# Patient Record
Sex: Female | Born: 1935 | Race: White | Hispanic: No | Marital: Married | Smoking: Never smoker
Health system: Southern US, Community
[De-identification: ages and names within clinical notes are randomized; demographics above are authoritative.]

## PROBLEM LIST (undated history)

## (undated) DIAGNOSIS — F329 Major depressive disorder, single episode, unspecified: Secondary | ICD-10-CM

## (undated) DIAGNOSIS — M1990 Unspecified osteoarthritis, unspecified site: Secondary | ICD-10-CM

## (undated) DIAGNOSIS — G629 Polyneuropathy, unspecified: Secondary | ICD-10-CM

## (undated) DIAGNOSIS — K219 Gastro-esophageal reflux disease without esophagitis: Secondary | ICD-10-CM

## (undated) DIAGNOSIS — R32 Unspecified urinary incontinence: Secondary | ICD-10-CM

## (undated) DIAGNOSIS — F32A Depression, unspecified: Secondary | ICD-10-CM

## (undated) DIAGNOSIS — I209 Angina pectoris, unspecified: Secondary | ICD-10-CM

## (undated) DIAGNOSIS — I10 Essential (primary) hypertension: Secondary | ICD-10-CM

## (undated) DIAGNOSIS — R197 Diarrhea, unspecified: Secondary | ICD-10-CM

## (undated) DIAGNOSIS — J189 Pneumonia, unspecified organism: Secondary | ICD-10-CM

## (undated) DIAGNOSIS — N393 Stress incontinence (female) (male): Secondary | ICD-10-CM

## (undated) DIAGNOSIS — R4182 Altered mental status, unspecified: Secondary | ICD-10-CM

## (undated) DIAGNOSIS — Z8619 Personal history of other infectious and parasitic diseases: Secondary | ICD-10-CM

## (undated) DIAGNOSIS — M48061 Spinal stenosis, lumbar region without neurogenic claudication: Secondary | ICD-10-CM

## (undated) DIAGNOSIS — E119 Type 2 diabetes mellitus without complications: Secondary | ICD-10-CM

## (undated) HISTORY — PX: CATARACT EXTRACTION W/ INTRAOCULAR LENS IMPLANT: SHX1309

---

## 1962-08-10 HISTORY — PX: TONSILLECTOMY: SUR1361

## 1970-08-10 HISTORY — PX: OVARIAN CYST SURGERY: SHX726

## 1980-08-10 HISTORY — PX: ABDOMINAL HYSTERECTOMY: SHX81

## 1986-08-10 HISTORY — PX: CHOLECYSTECTOMY: SHX55

## 1988-08-10 HISTORY — PX: BREAST BIOPSY: SHX20

## 1997-12-19 ENCOUNTER — Ambulatory Visit (HOSPITAL_COMMUNITY): Admission: RE | Admit: 1997-12-19 | Discharge: 1997-12-19 | Payer: Self-pay

## 1998-05-13 ENCOUNTER — Other Ambulatory Visit: Admission: RE | Admit: 1998-05-13 | Discharge: 1998-05-13 | Payer: Self-pay | Admitting: Gastroenterology

## 1998-05-14 ENCOUNTER — Ambulatory Visit (HOSPITAL_COMMUNITY): Admission: RE | Admit: 1998-05-14 | Discharge: 1998-05-14 | Payer: Self-pay | Admitting: Gastroenterology

## 1998-05-14 ENCOUNTER — Encounter: Payer: Self-pay | Admitting: Gastroenterology

## 1998-08-21 ENCOUNTER — Ambulatory Visit (HOSPITAL_COMMUNITY): Admission: RE | Admit: 1998-08-21 | Discharge: 1998-08-21 | Payer: Self-pay

## 1999-04-08 ENCOUNTER — Ambulatory Visit (HOSPITAL_BASED_OUTPATIENT_CLINIC_OR_DEPARTMENT_OTHER): Admission: RE | Admit: 1999-04-08 | Discharge: 1999-04-08 | Payer: Self-pay | Admitting: Orthopedic Surgery

## 1999-08-25 ENCOUNTER — Ambulatory Visit (HOSPITAL_COMMUNITY): Admission: RE | Admit: 1999-08-25 | Discharge: 1999-08-25 | Payer: Self-pay

## 2000-09-09 ENCOUNTER — Ambulatory Visit (HOSPITAL_COMMUNITY): Admission: RE | Admit: 2000-09-09 | Discharge: 2000-09-09 | Payer: Self-pay | Admitting: Internal Medicine

## 2000-09-09 ENCOUNTER — Encounter: Payer: Self-pay | Admitting: Internal Medicine

## 2001-07-05 ENCOUNTER — Encounter: Admission: RE | Admit: 2001-07-05 | Discharge: 2001-07-05 | Payer: Self-pay | Admitting: Family Medicine

## 2001-07-05 ENCOUNTER — Encounter: Payer: Self-pay | Admitting: Internal Medicine

## 2001-10-20 ENCOUNTER — Ambulatory Visit (HOSPITAL_COMMUNITY): Admission: RE | Admit: 2001-10-20 | Discharge: 2001-10-20 | Payer: Self-pay | Admitting: Internal Medicine

## 2001-10-20 ENCOUNTER — Encounter: Payer: Self-pay | Admitting: Internal Medicine

## 2001-10-28 ENCOUNTER — Encounter: Admission: RE | Admit: 2001-10-28 | Discharge: 2001-10-28 | Payer: Self-pay | Admitting: Internal Medicine

## 2001-10-28 ENCOUNTER — Encounter: Payer: Self-pay | Admitting: Internal Medicine

## 2003-01-12 ENCOUNTER — Encounter: Payer: Self-pay | Admitting: Internal Medicine

## 2003-01-12 ENCOUNTER — Ambulatory Visit (HOSPITAL_COMMUNITY): Admission: RE | Admit: 2003-01-12 | Discharge: 2003-01-12 | Payer: Self-pay | Admitting: Internal Medicine

## 2003-08-11 HISTORY — PX: INCONTINENCE SURGERY: SHX676

## 2004-02-22 ENCOUNTER — Encounter: Admission: RE | Admit: 2004-02-22 | Discharge: 2004-02-22 | Payer: Self-pay | Admitting: Internal Medicine

## 2004-06-02 ENCOUNTER — Observation Stay (HOSPITAL_COMMUNITY): Admission: RE | Admit: 2004-06-02 | Discharge: 2004-06-03 | Payer: Self-pay | Admitting: Urology

## 2005-03-30 ENCOUNTER — Ambulatory Visit (HOSPITAL_COMMUNITY): Admission: RE | Admit: 2005-03-30 | Discharge: 2005-03-30 | Payer: Self-pay | Admitting: Internal Medicine

## 2005-04-14 ENCOUNTER — Encounter: Admission: RE | Admit: 2005-04-14 | Discharge: 2005-04-14 | Payer: Self-pay | Admitting: Internal Medicine

## 2006-04-19 ENCOUNTER — Ambulatory Visit (HOSPITAL_COMMUNITY): Admission: RE | Admit: 2006-04-19 | Discharge: 2006-04-19 | Payer: Self-pay | Admitting: Internal Medicine

## 2007-02-15 ENCOUNTER — Encounter: Admission: RE | Admit: 2007-02-15 | Discharge: 2007-02-15 | Payer: Self-pay | Admitting: Internal Medicine

## 2007-05-03 ENCOUNTER — Ambulatory Visit (HOSPITAL_COMMUNITY): Admission: RE | Admit: 2007-05-03 | Discharge: 2007-05-03 | Payer: Self-pay | Admitting: Internal Medicine

## 2007-07-20 ENCOUNTER — Ambulatory Visit (HOSPITAL_COMMUNITY): Admission: RE | Admit: 2007-07-20 | Discharge: 2007-07-20 | Payer: Self-pay | Admitting: Urology

## 2007-08-11 DIAGNOSIS — Z8619 Personal history of other infectious and parasitic diseases: Secondary | ICD-10-CM

## 2007-08-11 HISTORY — DX: Personal history of other infectious and parasitic diseases: Z86.19

## 2007-08-11 HISTORY — PX: CYSTOSCOPY: SHX5120

## 2007-09-12 ENCOUNTER — Ambulatory Visit (HOSPITAL_BASED_OUTPATIENT_CLINIC_OR_DEPARTMENT_OTHER): Admission: RE | Admit: 2007-09-12 | Discharge: 2007-09-12 | Payer: Self-pay | Admitting: Urology

## 2008-05-03 ENCOUNTER — Ambulatory Visit (HOSPITAL_COMMUNITY): Admission: RE | Admit: 2008-05-03 | Discharge: 2008-05-03 | Payer: Self-pay | Admitting: Internal Medicine

## 2008-07-24 ENCOUNTER — Emergency Department (HOSPITAL_COMMUNITY): Admission: EM | Admit: 2008-07-24 | Discharge: 2008-07-24 | Payer: Self-pay | Admitting: Emergency Medicine

## 2009-05-06 ENCOUNTER — Ambulatory Visit (HOSPITAL_COMMUNITY): Admission: RE | Admit: 2009-05-06 | Discharge: 2009-05-06 | Payer: Self-pay | Admitting: Internal Medicine

## 2009-05-14 ENCOUNTER — Encounter: Admission: RE | Admit: 2009-05-14 | Discharge: 2009-05-14 | Payer: Self-pay | Admitting: Internal Medicine

## 2010-05-15 ENCOUNTER — Ambulatory Visit (HOSPITAL_COMMUNITY): Admission: RE | Admit: 2010-05-15 | Discharge: 2010-05-15 | Payer: Self-pay | Admitting: Internal Medicine

## 2010-12-23 NOTE — Op Note (Signed)
[name], [name]                ACCOUNT NO.:  [id number]   MEDICAL RECORD NO.:  [id number]          PATIENT TYPE:  AMB   LOCATION:  [facility]                         FACILITY:  [facility]   PHYSICIAN:  Sigmund I. Patsi Sears, M.D.DATE OF BIRTH:  [birth year]   DATE OF PROCEDURE:  DATE OF DISCHARGE:                               OPERATIVE REPORT   PREOPERATIVE DIAGNOSES:  Total urinary incontinence.   POSTOPERATIVE DIAGNOSES:  Total urinary incontinence.   OPERATIONS:  Cystourethroscopy, Macroplastique transurethral injection.   SURGEON:  Dr. Patsi Sears.   ANESTHESIA:  General with LMA.   PREPARATION:  After appropriate preanesthesia, the patient is brought to  the operating room, placed on the operating table in dorsal supine  position where general LMA anesthesia was introduced. She was then  replaced in dorsal lithotomy position where the pubis was prepped with  Betadine solution, draped in the usual fashion.   REVIEW OF HISTORY:  This 77 year old female has a history of 4 pad per  incontinence, status post AMS Monarch transvaginal sling in 2005 for  stress urinary continence with a positive Marshall test and positive Q-  tip test.  In 2007, she was noted to be  an insulin dependent diabetic,  with 2 pad per day incontinence, urge type, failing Vesicare, and  Enablex. Urodynamics showed bladder instability, hypersensitive bladder,  normal compliance, with leak point of pressure 91 cm of water. The  patient is now complaining of 10 pad per day incontinence, with  worsening cough, sneeze incontinence.  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   Urodynamics now shows an anterior abdominal leak point pressure of only  71 cm of water.  She is now for Macroplastique  transurethral injection  for ISD.   PROCEDURE:  Cystourethroscopy was accomplished, the urethra was easily  identified, and three vials of Macroplastique are injected submucosally.  The patient had excellent coaptation of her bladder neck.  The bladder  was drained of fluid, the patient was awakened and taken to the recovery  room in good condition.      Sigmund I. Patsi Sears, M.D.  Electronically Signed    SIT/MEDQ  D:  09/12/2007  T:  09/12/2007  Job:  [id number]   cc:   Thora Lance, M.D.  Fax: [phone]

## 2010-12-26 NOTE — Op Note (Signed)
[name], [name]                ACCOUNT NO.:  [id number]   MEDICAL RECORD NO.:  [id number]          PATIENT TYPE:  AMB   LOCATION:  DAY                          FACILITY:  [facility]   PHYSICIAN:  Sigmund I. Patsi Sears, M.D.DATE OF BIRTH:  1932/10/28   DATE OF PROCEDURE:  06/02/2004  DATE OF DISCHARGE:                                 OPERATIVE REPORT   PREOPERATIVE DIAGNOSES:  Stress urinary incontinence.   POSTOPERATIVE DIAGNOSES:  1.  Stress urinary incontinence.  2.  Rectocele (mild to moderate).   OPERATION:  Transvaginal AMS Monarch pubovaginal sling.   SURGEON:  Sigmund I. Patsi Sears, M.D.   ANESTHESIA:  General LMA.   PREPARATION:  After appropriate preanesthesia, the patient was brought to  the operating room and placed on the operating table in the dorsal supine  position where general LMA anesthesia was administered. She was replaced in  dorsal lithotomy position where the pubis was prepped with Betadine solution  and draped in the usual fashion.   HISTORY:  This 77 year old diabetic female complains of urinary frequency,  stress urinary incontinence wearing 3-4 pads a day, leaking with coughing,  laughing and sneezing. She also leaks without awareness. The patient is now  for pubovaginal sling.   The physical examination showed a positive Marshall test and positive Q-tip  test and no rectocele was identified on office examination. However, exam  under anesthesia, reveals a mild to moderate rectocele but no cystocele.   DESCRIPTION OF PROCEDURE:  Following Betadine vaginal prep, 6 mL of 0.25  plain Marcaine are injected into the pubovaginal cervical arch tissue and  over the urethra. A 1.5 cm incision is then made in the midline and  subcutaneous tissue dissected bilaterally to the level of the retropubic  fascia. Two separate stab wounds were then made 5 cm measured lateral to the  clitoris, and the AMS Monarch transvaginal sling was placed in standard  fashion.  Cystoscopy revealed no evidence of any bladder trauma. The sling  was then cut in the subcutaneous lateral incisions, and the sleeves were  irrigated with double antibiotic irrigation and removed in standard fashion.  A right angled clamp was placed behind the sling for tensioning. The wound  was closed in two layers with interrupted and running 3-0 Vicryl suture. The  patient tolerated the procedure well. She was awakened and taken to the  recovery room in good condition. Foley catheter was left in place.     SIT/MEDQ  D:  06/02/2004  T:  06/02/2004  Job:  [id number]

## 2011-03-10 ENCOUNTER — Other Ambulatory Visit: Payer: Self-pay | Admitting: Internal Medicine

## 2011-03-10 DIAGNOSIS — R1032 Left lower quadrant pain: Secondary | ICD-10-CM

## 2011-03-11 ENCOUNTER — Ambulatory Visit
Admission: RE | Admit: 2011-03-11 | Discharge: 2011-03-11 | Disposition: A | Discharge status: Home / Self Care | Payer: Medicare Other | Source: Ambulatory Visit | Attending: Internal Medicine | Admitting: Internal Medicine

## 2011-03-11 DIAGNOSIS — R1032 Left lower quadrant pain: Secondary | ICD-10-CM

## 2011-03-11 MED ORDER — IOHEXOL 300 MG/ML  SOLN
100.0000 mL | Freq: Once | INTRAMUSCULAR | Status: AC | PRN
  Administered 2011-03-11: 100 mL via INTRAVENOUS

## 2011-05-01 LAB — POCT I-STAT 4, (NA,K, GLUC, HGB,HCT)
Glucose, Bld: 140 — ABNORMAL HIGH
HCT: 45
Hemoglobin: 15.3 — ABNORMAL HIGH
Potassium: 3.5
Sodium: 143

## 2011-05-14 ENCOUNTER — Other Ambulatory Visit (HOSPITAL_COMMUNITY): Payer: Self-pay | Admitting: Internal Medicine

## 2011-05-14 DIAGNOSIS — Z1231 Encounter for screening mammogram for malignant neoplasm of breast: Secondary | ICD-10-CM

## 2011-05-15 LAB — COMPREHENSIVE METABOLIC PANEL
ALT: 17 U/L (ref 0–35)
Calcium: 9.5 mg/dL (ref 8.4–10.5)
Glucose, Bld: 80 mg/dL (ref 70–99)
Sodium: 142 mEq/L (ref 135–145)
Total Protein: 6.5 g/dL (ref 6.0–8.3)

## 2011-05-15 LAB — DIFFERENTIAL
Lymphs Abs: 1.8 10*3/uL (ref 0.7–4.0)
Monocytes Relative: 6 % (ref 3–12)
Neutro Abs: 4.7 10*3/uL (ref 1.7–7.7)
Neutrophils Relative %: 65 % (ref 43–77)

## 2011-05-15 LAB — CBC
Hemoglobin: 13.9 g/dL (ref 12.0–15.0)
MCHC: 32.9 g/dL (ref 30.0–36.0)
Platelets: 235 10*3/uL (ref 150–400)
RDW: 15.3 % (ref 11.5–15.5)

## 2011-05-15 LAB — URINALYSIS, ROUTINE W REFLEX MICROSCOPIC
Bilirubin Urine: NEGATIVE
Ketones, ur: NEGATIVE mg/dL
Nitrite: NEGATIVE
Protein, ur: NEGATIVE mg/dL
Urobilinogen, UA: 1 mg/dL (ref 0.0–1.0)
pH: 6.5 (ref 5.0–8.0)

## 2011-05-18 LAB — CREATININE, SERUM
Creatinine, Ser: 0.91
GFR calc non Af Amer: >60

## 2011-06-02 ENCOUNTER — Ambulatory Visit (HOSPITAL_COMMUNITY)
Admission: RE | Admit: 2011-06-02 | Discharge: 2011-06-02 | Disposition: A | Discharge status: Home / Self Care | Payer: Medicare Other | Source: Ambulatory Visit | Attending: Internal Medicine | Admitting: Internal Medicine

## 2011-06-02 DIAGNOSIS — Z1231 Encounter for screening mammogram for malignant neoplasm of breast: Secondary | ICD-10-CM | POA: Insufficient documentation

## 2011-07-27 ENCOUNTER — Ambulatory Visit (INDEPENDENT_AMBULATORY_CARE_PROVIDER_SITE_OTHER): Payer: Medicare Other | Admitting: Ophthalmology

## 2011-07-27 DIAGNOSIS — E11319 Type 2 diabetes mellitus with unspecified diabetic retinopathy without macular edema: Secondary | ICD-10-CM

## 2011-07-27 DIAGNOSIS — E1139 Type 2 diabetes mellitus with other diabetic ophthalmic complication: Secondary | ICD-10-CM

## 2011-07-27 DIAGNOSIS — H43819 Vitreous degeneration, unspecified eye: Secondary | ICD-10-CM

## 2011-09-30 ENCOUNTER — Other Ambulatory Visit: Payer: Self-pay | Admitting: Urology

## 2011-10-09 ENCOUNTER — Encounter (HOSPITAL_BASED_OUTPATIENT_CLINIC_OR_DEPARTMENT_OTHER): Payer: Self-pay | Admitting: *Deleted

## 2011-10-09 NOTE — Progress Notes (Signed)
To [facility] at 0715.Istat,Ekg on arrival,Npo after mn-Guidelines for RCC reviewed.instructed to use glucose tablets if her  levels were low since cannot eat drink after mn.

## 2011-10-14 NOTE — Progress Notes (Signed)
Spoke to [facility] at Dr Imelda Pillow office regarding whether he really wants HgA1c done as ordered even with Istat being done.  Can d/c order.

## 2011-10-15 ENCOUNTER — Encounter (HOSPITAL_BASED_OUTPATIENT_CLINIC_OR_DEPARTMENT_OTHER): Payer: Self-pay | Admitting: *Deleted

## 2011-10-15 ENCOUNTER — Ambulatory Visit (HOSPITAL_BASED_OUTPATIENT_CLINIC_OR_DEPARTMENT_OTHER): Payer: Medicare Other | Admitting: Anesthesiology

## 2011-10-15 ENCOUNTER — Encounter (HOSPITAL_BASED_OUTPATIENT_CLINIC_OR_DEPARTMENT_OTHER)
Admission: RE | Disposition: A | Discharge status: Home / Self Care | Payer: Self-pay | Source: Ambulatory Visit | Attending: Urology

## 2011-10-15 ENCOUNTER — Ambulatory Visit (HOSPITAL_BASED_OUTPATIENT_CLINIC_OR_DEPARTMENT_OTHER)
Admission: RE | Admit: 2011-10-15 | Discharge: 2011-10-16 | Disposition: A | Discharge status: Home / Self Care | Payer: Medicare Other | Source: Ambulatory Visit | Attending: Urology | Admitting: Urology

## 2011-10-15 ENCOUNTER — Other Ambulatory Visit: Payer: Self-pay

## 2011-10-15 ENCOUNTER — Encounter (HOSPITAL_BASED_OUTPATIENT_CLINIC_OR_DEPARTMENT_OTHER): Payer: Self-pay | Admitting: Anesthesiology

## 2011-10-15 DIAGNOSIS — R12 Heartburn: Secondary | ICD-10-CM | POA: Insufficient documentation

## 2011-10-15 DIAGNOSIS — M533 Sacrococcygeal disorders, not elsewhere classified: Secondary | ICD-10-CM | POA: Insufficient documentation

## 2011-10-15 DIAGNOSIS — Z01812 Encounter for preprocedural laboratory examination: Secondary | ICD-10-CM | POA: Insufficient documentation

## 2011-10-15 DIAGNOSIS — N8110 Cystocele, unspecified: Secondary | ICD-10-CM | POA: Insufficient documentation

## 2011-10-15 DIAGNOSIS — N816 Rectocele: Secondary | ICD-10-CM | POA: Insufficient documentation

## 2011-10-15 DIAGNOSIS — M545 Low back pain, unspecified: Secondary | ICD-10-CM | POA: Insufficient documentation

## 2011-10-15 DIAGNOSIS — Z90710 Acquired absence of both cervix and uterus: Secondary | ICD-10-CM | POA: Insufficient documentation

## 2011-10-15 DIAGNOSIS — R339 Retention of urine, unspecified: Secondary | ICD-10-CM | POA: Insufficient documentation

## 2011-10-15 DIAGNOSIS — F22 Delusional disorders: Secondary | ICD-10-CM

## 2011-10-15 DIAGNOSIS — Z79899 Other long term (current) drug therapy: Secondary | ICD-10-CM | POA: Insufficient documentation

## 2011-10-15 DIAGNOSIS — E119 Type 2 diabetes mellitus without complications: Secondary | ICD-10-CM | POA: Insufficient documentation

## 2011-10-15 DIAGNOSIS — N3941 Urge incontinence: Secondary | ICD-10-CM | POA: Insufficient documentation

## 2011-10-15 DIAGNOSIS — Z01810 Encounter for preprocedural cardiovascular examination: Secondary | ICD-10-CM | POA: Insufficient documentation

## 2011-10-15 DIAGNOSIS — Z7982 Long term (current) use of aspirin: Secondary | ICD-10-CM | POA: Insufficient documentation

## 2011-10-15 HISTORY — PX: CYSTOSCOPY: SHX5120

## 2011-10-15 HISTORY — DX: Unspecified urinary incontinence: R32

## 2011-10-15 HISTORY — DX: Unspecified osteoarthritis, unspecified site: M19.90

## 2011-10-15 HISTORY — DX: Essential (primary) hypertension: I10

## 2011-10-15 HISTORY — DX: Diarrhea, unspecified: R19.7

## 2011-10-15 HISTORY — PX: RECTOCELE REPAIR: SHX761

## 2011-10-15 HISTORY — DX: Polyneuropathy, unspecified: G62.9

## 2011-10-15 HISTORY — DX: Gastro-esophageal reflux disease without esophagitis: K21.9

## 2011-10-15 LAB — GLUCOSE, CAPILLARY: Glucose-Capillary: 206 mg/dL — ABNORMAL HIGH (ref 70–99)

## 2011-10-15 SURGERY — COLPORRHAPHY, POSTERIOR, FOR RECTOCELE REPAIR
Anesthesia: General | Site: Vagina | Wound class: Clean Contaminated

## 2011-10-15 MED ORDER — BUPIVACAINE-EPINEPHRINE 0.5% -1:200000 IJ SOLN
INTRAMUSCULAR | Status: DC | PRN
  Administered 2011-10-15: 30 mL

## 2011-10-15 MED ORDER — BELLADONNA ALKALOIDS-OPIUM 16.2-60 MG RE SUPP
RECTAL | Status: DC | PRN
  Administered 2011-10-15: 1 via RECTAL

## 2011-10-15 MED ORDER — HYDROCODONE-ACETAMINOPHEN 5-325 MG PO TABS
1.0000 | ORAL_TABLET | ORAL | Status: DC | PRN
  Administered 2011-10-15: 1 via ORAL
  Administered 2011-10-15: 2 via ORAL

## 2011-10-15 MED ORDER — LIDOCAINE HCL (CARDIAC) 20 MG/ML IV SOLN
INTRAVENOUS | Status: DC | PRN
  Administered 2011-10-15: 80 mg via INTRAVENOUS

## 2011-10-15 MED ORDER — KETOROLAC TROMETHAMINE 15 MG/ML IJ SOLN
15.0000 mg | Freq: Four times a day (QID) | INTRAMUSCULAR | Status: DC
  Administered 2011-10-15 (×2): 15 mg via INTRAVENOUS

## 2011-10-15 MED ORDER — PROMETHAZINE HCL 25 MG/ML IJ SOLN: 6.2500 mg | INTRAMUSCULAR | Status: DC | PRN

## 2011-10-15 MED ORDER — FENTANYL CITRATE 0.05 MG/ML IJ SOLN
INTRAMUSCULAR | Status: DC | PRN
  Administered 2011-10-15: 50 ug via INTRAVENOUS
  Administered 2011-10-15 (×2): 25 ug via INTRAVENOUS
  Administered 2011-10-15: 50 ug via INTRAVENOUS
  Administered 2011-10-15 (×2): 25 ug via INTRAVENOUS
  Administered 2011-10-15 (×2): 50 ug via INTRAVENOUS

## 2011-10-15 MED ORDER — TRIAMTERENE-HCTZ 75-50 MG PO TABS: 1.0000 | ORAL_TABLET | Freq: Every day | ORAL | Status: DC

## 2011-10-15 MED ORDER — ONDANSETRON HCL 4 MG/2ML IJ SOLN: 4.0000 mg | INTRAMUSCULAR | Status: DC | PRN

## 2011-10-15 MED ORDER — ZOLPIDEM TARTRATE 5 MG PO TABS: 5.0000 mg | ORAL_TABLET | Freq: Every evening | ORAL | Status: DC | PRN

## 2011-10-15 MED ORDER — ACETAMINOPHEN 10 MG/ML IV SOLN
INTRAVENOUS | Status: DC | PRN
  Administered 2011-10-15: 1000 mg via INTRAVENOUS

## 2011-10-15 MED ORDER — LACTATED RINGERS IV SOLN
INTRAVENOUS | Status: DC
  Administered 2011-10-15 (×3): via INTRAVENOUS

## 2011-10-15 MED ORDER — POTASSIUM CHLORIDE 20 MEQ PO PACK: 20.0000 meq | PACK | Freq: Every day | ORAL | Status: DC

## 2011-10-15 MED ORDER — ACETAMINOPHEN 10 MG/ML IV SOLN
1000.0000 mg | Freq: Four times a day (QID) | INTRAVENOUS | Status: DC
  Administered 2011-10-15: 1000 mg via INTRAVENOUS

## 2011-10-15 MED ORDER — ESTRADIOL 0.1 MG/GM VA CREA
TOPICAL_CREAM | VAGINAL | Status: DC | PRN
  Administered 2011-10-15: 1 via VAGINAL

## 2011-10-15 MED ORDER — POLYETHYLENE GLYCOL 3350 17 G PO PACK: 17.0000 g | PACK | Freq: Every day | ORAL | Status: DC | PRN

## 2011-10-15 MED ORDER — ONDANSETRON HCL 4 MG/2ML IJ SOLN
INTRAMUSCULAR | Status: DC | PRN
  Administered 2011-10-15: 4 mg via INTRAVENOUS

## 2011-10-15 MED ORDER — STERILE WATER FOR IRRIGATION IR SOLN
Status: DC | PRN
  Administered 2011-10-15: 3000 mL

## 2011-10-15 MED ORDER — CIPROFLOXACIN HCL 250 MG PO TABS
250.0000 mg | ORAL_TABLET | Freq: Two times a day (BID) | ORAL | Status: DC
  Administered 2011-10-15: 250 mg via ORAL

## 2011-10-15 MED ORDER — KETOROLAC TROMETHAMINE 30 MG/ML IJ SOLN
INTRAMUSCULAR | Status: DC | PRN
  Administered 2011-10-15: 30 mg via INTRAVENOUS

## 2011-10-15 MED ORDER — FENTANYL CITRATE 0.05 MG/ML IJ SOLN
25.0000 ug | INTRAMUSCULAR | Status: DC | PRN
  Administered 2011-10-15 (×2): 25 ug via INTRAVENOUS

## 2011-10-15 MED ORDER — PROPOFOL 10 MG/ML IV EMUL
INTRAVENOUS | Status: DC | PRN
  Administered 2011-10-15: 180 mg via INTRAVENOUS

## 2011-10-15 MED ORDER — SODIUM CHLORIDE 0.45 % IV SOLN
INTRAVENOUS | Status: DC
  Administered 2011-10-15: 20:00:00 via INTRAVENOUS

## 2011-10-15 MED ORDER — INDIGOTINDISULFONATE SODIUM 8 MG/ML IJ SOLN
INTRAMUSCULAR | Status: DC | PRN
  Administered 2011-10-15: 5 mL via INTRAVENOUS

## 2011-10-15 MED ORDER — DIPHENHYDRAMINE HCL 12.5 MG/5ML PO ELIX: 12.5000 mg | ORAL_SOLUTION | Freq: Four times a day (QID) | ORAL | Status: DC | PRN

## 2011-10-15 MED ORDER — HYOSCYAMINE SULFATE 0.125 MG SL SUBL: 0.1250 mg | SUBLINGUAL_TABLET | SUBLINGUAL | Status: DC | PRN

## 2011-10-15 MED ORDER — INSULIN ASPART 100 UNIT/ML ~~LOC~~ SOLN: 10.0000 [IU] | SUBCUTANEOUS | Status: DC

## 2011-10-15 MED ORDER — DIPHENHYDRAMINE HCL 50 MG/ML IJ SOLN: 12.5000 mg | Freq: Four times a day (QID) | INTRAMUSCULAR | Status: DC | PRN

## 2011-10-15 MED ORDER — HYDROMORPHONE HCL PF 1 MG/ML IJ SOLN: 0.5000 mg | INTRAMUSCULAR | Status: DC | PRN

## 2011-10-15 MED ORDER — SENNOSIDES-DOCUSATE SODIUM 8.6-50 MG PO TABS
2.0000 | ORAL_TABLET | Freq: Every day | ORAL | Status: DC
  Administered 2011-10-15: 2 via ORAL

## 2011-10-15 MED ORDER — SODIUM CHLORIDE 0.9 % IR SOLN
Status: DC | PRN
  Administered 2011-10-15: 09:00:00

## 2011-10-15 MED ORDER — SODIUM CHLORIDE 0.9 % IJ SOLN
INTRAMUSCULAR | Status: DC | PRN
  Administered 2011-10-15: 50 mL via INTRAVENOUS

## 2011-10-15 MED ORDER — INSULIN NPH (HUMAN) (ISOPHANE) 100 UNIT/ML ~~LOC~~ SUSP: 72.0000 [IU] | Freq: Every day | SUBCUTANEOUS | Status: DC

## 2011-10-15 MED ORDER — CEFAZOLIN SODIUM 1-5 GM-% IV SOLN
INTRAVENOUS | Status: DC | PRN
  Administered 2011-10-15: 1 g via INTRAVENOUS

## 2011-10-15 MED ORDER — HYDROCODONE-ACETAMINOPHEN 5-325 MG PO TABS: 1.0000 | ORAL_TABLET | Freq: Four times a day (QID) | ORAL | Status: DC | PRN

## 2011-10-15 SURGICAL SUPPLY — 58 items
ADH SKN CLS APL DERMABOND .7 (GAUZE/BANDAGES/DRESSINGS)
BAG URINE DRAINAGE (UROLOGICAL SUPPLIES) ×4 IMPLANT
BLADE SURG 10 STRL SS (BLADE) ×4 IMPLANT
BLADE SURG 15 STRL LF DISP TIS (BLADE) ×3 IMPLANT
BLADE SURG 15 STRL SS (BLADE) ×4
BLADE SURG ROTATE 9660 (MISCELLANEOUS) ×4 IMPLANT
BOOTIES KNEE HIGH SLOAN (MISCELLANEOUS) ×4 IMPLANT
CANISTER SUCTION 1200CC (MISCELLANEOUS) IMPLANT
CANISTER SUCTION 2500CC (MISCELLANEOUS) ×6 IMPLANT
CATH FOLEY 2WAY SLVR  5CC 16FR (CATHETERS) ×1
CATH FOLEY 2WAY SLVR 5CC 16FR (CATHETERS) ×3 IMPLANT
CLOTH BEACON ORANGE TIMEOUT ST (SAFETY) ×4 IMPLANT
COVER MAYO STAND STRL (DRAPES) ×4 IMPLANT
DERMABOND ADVANCED (GAUZE/BANDAGES/DRESSINGS)
DERMABOND ADVANCED .7 DNX12 (GAUZE/BANDAGES/DRESSINGS) IMPLANT
DIGITEX SUTURE CARTRIDGE ×6 IMPLANT
DIGITEX SUTURING DELIVERING DEVICE ×2 IMPLANT
DISSECTOR BLUNT TIP ENDO 5MM (MISCELLANEOUS) IMPLANT
DISSECTOR ROUND CHERRY 3/8 STR (MISCELLANEOUS) ×2 IMPLANT
DRAIN PENROSE 18X1/2 LTX STRL (DRAIN) IMPLANT
DRAPE CAMERA CLOSED 9X96 (DRAPES) ×4 IMPLANT
DRAPE UNDERBUTTOCKS STRL (DRAPE) ×4 IMPLANT
FLOSEAL 10ML (HEMOSTASIS) IMPLANT
GAUZE SPONGE 4X4 16PLY XRAY LF (GAUZE/BANDAGES/DRESSINGS) ×2 IMPLANT
GAUZE VAGINAL PACKING 31 073 (GAUZE/BANDAGES/DRESSINGS) ×2 IMPLANT
GLOVE BIO SURGEON STRL SZ 6.5 (GLOVE) ×2 IMPLANT
GLOVE BIO SURGEON STRL SZ7.5 (GLOVE) ×4 IMPLANT
GLOVE ECLIPSE 6.0 STRL STRAW (GLOVE) ×2 IMPLANT
GLOVE INDICATOR 6.5 STRL GRN (GLOVE) ×2 IMPLANT
GOWN PREVENTION PLUS LG XLONG (DISPOSABLE) ×4 IMPLANT
GOWN STRL REIN XL XLG (GOWN DISPOSABLE) ×4 IMPLANT
NEEDLE HYPO 22GX1.5 SAFETY (NEEDLE) ×8 IMPLANT
PACKING VAGINAL (PACKING) ×4 IMPLANT
PENCIL BUTTON HOLSTER BLD 10FT (ELECTRODE) ×4 IMPLANT
PLUG CATH AND CAP STER (CATHETERS) ×4 IMPLANT
RETRACTOR LONRSTAR 16.6X16.6CM (MISCELLANEOUS) ×3 IMPLANT
RETRACTOR STAY HOOK 5MM (MISCELLANEOUS) ×4 IMPLANT
RETRACTOR STER APS 16.6X16.6CM (MISCELLANEOUS) ×4
SET IRRIG Y TYPE TUR BLADDER L (SET/KITS/TRAYS/PACK) ×4 IMPLANT
SHEET LAVH (DRAPES) ×4 IMPLANT
SLING SOLYX SYSTEM SIS BX (SLING) IMPLANT
SPONGE LAP 18X18 X RAY DECT (DISPOSABLE) IMPLANT
SPONGE LAP 4X18 X RAY DECT (DISPOSABLE) ×4 IMPLANT
SUCTION FRAZIER TIP 10 FR DISP (SUCTIONS) ×4 IMPLANT
SUT ETHILON 2 0 PS N (SUTURE) IMPLANT
SUT MON AB 2-0 SH 27 (SUTURE)
SUT MON AB 2-0 SH27 (SUTURE) IMPLANT
SUT SILK 3 0 PS 1 (SUTURE) ×4 IMPLANT
SUT VIC AB 2-0 UR6 27 (SUTURE) ×24 IMPLANT
SYR BULB IRRIGATION 50ML (SYRINGE) ×4 IMPLANT
SYR CONTROL 10ML LL (SYRINGE) ×6 IMPLANT
SYRINGE 10CC LL (SYRINGE) ×4 IMPLANT
TISSUE AXIS TUTOPLAST 8CMX12CM (Tissue) ×2 IMPLANT
TRAY DSU PREP LF (CUSTOM PROCEDURE TRAY) ×4 IMPLANT
TUBE CONNECTING 12X1/4 (SUCTIONS) ×8 IMPLANT
WATER STERILE IRR 3000ML UROMA (IV SOLUTION) ×2 IMPLANT
WATER STERILE IRR 500ML POUR (IV SOLUTION) ×6 IMPLANT
YANKAUER SUCT BULB TIP NO VENT (SUCTIONS) IMPLANT

## 2011-10-15 NOTE — Op Note (Signed)
Pre-operative diagnosis : Posterior vaginal wall prolapse with high rectocele  Postoperative diagnosis: Same  Operation: Posterior vaginal vault extraperitoneal colpopexy with graft interposition, using Coloplast Axis  dermis graft.  Surgeon:  S. Maleeka Sabatino, MD  First assistant: None  Anesthesia:  general  Preparation: After appropriate preanesthesia, the patient was brought to the operating room, and placed upon the operating table in the dorsal supine position. The arm band was rechecked, and she underwent general LMA anesthesia. The pubis was then shaven, prepped with Betadine solution, and draped in usual fashion.  Review history: The patient is a 78-year-old diabetic female, with a 3 year history of worsening rectal prolapse, with chronic vaginal discomfort. The patient palpates her rectal prolapse outside of the vagina. She is 40 years post hysterectomy. She is para 3-3-0 with no history of tobacco use. She has an increased difficulty with defecation, but denies rectal incontinence. She also has diabetic peripheral neuropathy.  The patient has had Monarch sling in the past for stress urinary incontinence (2005), and followup. Urethral injection in 2009 with reduced stress incontinence. Physical exam showed a large, stage III rectocele (grade 4), with no evidence of mesh difficulty from prior procedure. Video urodynamics showed a slightly hyposensitive bladder, with no stress incontinence with prolapse reduced. She did have a Valsalva leak point pressure which was somewhat low at 72 cm water, however. 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  Statement of  Likelihood of Success: Excellent. TIME-OUT observed.:  Procedure: Examination under anesthesia revealed that the patient has a large rectal prolapse, with no cystocele noted. She does have a urethral  carbuncle, indicating postmenopausal vaginal atrophy. This has been discussed with the patient and her family, and she will need to have vaginal estrogenization postoperatively.  The patient had pop Q. score and, and was felt to have a follow pop Q. Score:  Aa -5  Ba -1.5    C -6                                                                                                                               Gh 3   Pb 2         TVL  7.5                                                                                                                                 Ap 2    Bp  2        D  N/a Using Marcaine 0.25 with epinephrine 1-200,000, hydrodissection was accomplished using 40 cc of solution. Using a marking pen, a triangular area of dissection was outlined, and perineal body incision was made. Subcutaneous tissue was dissected with the Strully scissors, and the large posterior vaginal wall hernia was sharply and bluntly dissected. The retroperitoneum was entered posteriorly bilaterally, and the ischial spines were identified. The sacrospinous ligaments were then dissected bilaterally.  Using the Coloplast Digitek suturing device, 2 separate 0 monofilament sutures were placed through the sacrospinous ligament. Using horizontal mattress 20 sutures, the large rectal prolapse was plicated, without tension.  A geometric form was then created using  Coloplast Axis dermis, with a proximal horizontal measurement of 8 cm, a distal measurement of 4 cm, a midline length of 7-1/2 cm. Once this graft material was designed, the sutures through the sacrospinous ligaments were attached bilaterally to the proximal edges, and a midline suture was placed at the apex using 2-0 Vicryl suture. The graft material covered the rectal prolapse easily, and without tension.  The rectal vaginal mucosa was then closed with multiple running 2-0 Vicryl sutures. Minimal bleeding was noted. The patient was given IV Tylenol at the beginning of  the procedure, and IV Toradol at the end of the procedure. Cystoscopy was accomplished, and showed normal bladder base, and urethra. Again, no evidence of any mesh erosion or extrusion was identified from prior surgery. She was given indigo carmine, and blue dye was observed in her urine on cystoscopic examination.  She was awakened, taken to recovery room in good condition.   

## 2011-10-15 NOTE — Transfer of Care (Signed)
Immediate Anesthesia Transfer of Care Note  Patient: [name]  Procedure(s) Performed: Procedure(s) (LRB): POSTERIOR REPAIR (RECTOCELE) () CYSTOSCOPY (N/A)  Patient Location: Patient transported to PACU with oxygen via face mask at 4 Liters / Min  Anesthesia Type: General  Level of Consciousness: awake and alert   Airway & Oxygen Therapy: Patient Spontanous Breathing and Patient connected to face mask oxygen  Post-op Assessment: Report given to PACU RN and Post -op Vital signs reviewed and stable  Post vital signs: Reviewed and stable  Dentition: Teeth and oropharynx remain in pre-op condition  Complications: No apparent anesthesia complications

## 2011-10-15 NOTE — Op Note (Signed)
Pre-operative diagnosis : Posterior vaginal wall prolapse with high rectocele  Postoperative diagnosis: Same  Operation: Posterior vaginal vault extraperitoneal colpopexy with graft interposition, using Coloplast Axis  dermis graft.  Surgeon:  Kathie Rhodes. Patsi Sears, MD  First assistant: None  Anesthesia:  general  Preparation: After appropriate preanesthesia, the patient was brought to the operating room, and placed upon the operating table in the dorsal supine position. The arm band was rechecked, and she underwent general LMA anesthesia. The pubis was then shaven, prepped with Betadine solution, and draped in usual fashion.  Review history: The patient is a 78 year old diabetic female, with a 3 year history of worsening rectal prolapse, with chronic vaginal discomfort. The patient palpates her rectal prolapse outside of the vagina. She is 40 years post hysterectomy. She is para 3-3-0 with no history of tobacco use. She has an increased difficulty with defecation, but denies rectal incontinence. She also has diabetic peripheral neuropathy.  The patient has had Monarch sling in the past for stress urinary incontinence (2005), and followup. Urethral injection in 2009 with reduced stress incontinence. Physical exam showed a large, stage III rectocele (grade 4), with no evidence of mesh difficulty from prior procedure. Video urodynamics showed a slightly hyposensitive bladder, with no stress incontinence with prolapse reduced. She did have a Valsalva leak point pressure which was somewhat low at 72 cm water, however. 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  Statement of  Likelihood of Success: Excellent. TIME-OUT observed.:  Procedure: Examination under anesthesia revealed that the patient has a large rectal prolapse, with no cystocele noted. She does have a urethral  carbuncle, indicating postmenopausal vaginal atrophy. This has been discussed with the patient and her family, and she will need to have vaginal estrogenization postoperatively.  The patient had pop Q. score and, and was felt to have a follow pop Q. Score:  Aa -5  Ba -1.5    C -6                                                                                                                               Gh 3   Pb 2         TVL  7.5  Ap 2    Bp  2        D  N/a Using Marcaine 0.25 with epinephrine 1-200,000, hydrodissection was accomplished using 40 cc of solution. Using a marking pen, a triangular area of dissection was outlined, and perineal body incision was made. Subcutaneous tissue was dissected with the Strully scissors, and the large posterior vaginal wall hernia was sharply and bluntly dissected. The retroperitoneum was entered posteriorly bilaterally, and the ischial spines were identified. The sacrospinous ligaments were then dissected bilaterally.  Using the Coloplast Digitek suturing device, 2 separate 0 monofilament sutures were placed through the sacrospinous ligament. Using horizontal mattress 20 sutures, the large rectal prolapse was plicated, without tension.  A geometric form was then created using  Coloplast Axis dermis, with a proximal horizontal measurement of 8 cm, a distal measurement of 4 cm, a midline length of 7-1/2 cm. Once this graft material was designed, the sutures through the sacrospinous ligaments were attached bilaterally to the proximal edges, and a midline suture was placed at the apex using 2-0 Vicryl suture. The graft material covered the rectal prolapse easily, and without tension.  The rectal vaginal mucosa was then closed with multiple running 2-0 Vicryl sutures. Minimal bleeding was noted. The patient was given IV Tylenol at the beginning of  the procedure, and IV Toradol at the end of the procedure. Cystoscopy was accomplished, and showed normal bladder base, and urethra. Again, no evidence of any mesh erosion or extrusion was identified from prior surgery. She was given indigo carmine, and blue dye was observed in her urine on cystoscopic examination.  She was awakened, taken to recovery room in good condition.

## 2011-10-15 NOTE — Anesthesia Postprocedure Evaluation (Signed)
  Anesthesia Post-op Note  Patient: [name]  Procedure(s) Performed: Procedure(s) (LRB): POSTERIOR REPAIR (RECTOCELE) () CYSTOSCOPY (N/A)  Patient Location: PACU  Anesthesia Type: General  Level of Consciousness: awake and alert   Airway and Oxygen Therapy: Patient Spontanous Breathing  Post-op Pain: mild  Post-op Assessment: Post-op Vital signs reviewed, Patient's Cardiovascular Status Stable, Respiratory Function Stable, Patent Airway and No signs of Nausea or vomiting  Post-op Vital Signs: stable  Complications: No apparent anesthesia complications

## 2011-10-15 NOTE — Interval H&P Note (Signed)
History and Physical Interval Note:  10/15/2011 8:52 AM  [name]  has presented today for surgery, with the diagnosis of rectocele  The various methods of treatment have been discussed with the patient and family. After consideration of risks, benefits and other options for treatment, the patient has consented to  Procedure(s) (LRB): ANTERIOR (CYSTOCELE) AND POSTERIOR REPAIR (RECTOCELE) (N/A) as a surgical intervention .  The patients' history has been reviewed, patient examined, no change in status, stable for surgery.  I have reviewed the patients' chart and labs.  Questions were answered to the patient's satisfaction.     Jethro Bolus I

## 2011-10-15 NOTE — Anesthesia Procedure Notes (Signed)
Procedure Name: LMA Insertion Date/Time: 10/15/2011 8:59 AM Performed by: Lorrin Jackson Pre-anesthesia Checklist: Patient identified, Emergency Drugs available, Suction available and Patient being monitored Patient Re-evaluated:Patient Re-evaluated prior to inductionOxygen Delivery Method: Circle System Utilized Preoxygenation: Pre-oxygenation with 100% oxygen Intubation Type: IV induction Ventilation: Mask ventilation without difficulty LMA: LMA with gastric port inserted LMA Size: 4.0 Number of attempts: 1 Placement Confirmation: positive ETCO2 Tube secured with: Tape Dental Injury: Teeth and Oropharynx as per pre-operative assessment

## 2011-10-15 NOTE — Progress Notes (Signed)
Upper denture returned to patient

## 2011-10-15 NOTE — Anesthesia Preprocedure Evaluation (Signed)
Anesthesia Evaluation  Patient identified by MRN, date of birth, ID band Patient awake    Reviewed: Allergy & Precautions, H&P , NPO status , Patient's Chart, lab work & pertinent test results  Airway Mallampati: II TM Distance: >3 FB Neck ROM: Full    Dental No notable dental hx. (+) Edentulous Upper   Pulmonary neg pulmonary ROS,  breath sounds clear to auscultation  Pulmonary exam normal       Cardiovascular hypertension, Pt. on medications Rhythm:Regular Rate:Normal     Neuro/Psych  Neuromuscular disease negative psych ROS   GI/Hepatic negative GI ROS, Neg liver ROS,   Endo/Other  Diabetes mellitus-, Type 1, Insulin Dependent  Renal/GU negative Renal ROS  negative genitourinary   Musculoskeletal negative musculoskeletal ROS (+)   Abdominal   Peds negative pediatric ROS (+)  Hematology negative hematology ROS (+)   Anesthesia Other Findings   Reproductive/Obstetrics negative OB ROS                           Anesthesia Physical Anesthesia Plan  ASA: III  Anesthesia Plan: General   Post-op Pain Management:    Induction: Intravenous  Airway Management Planned: LMA  Additional Equipment:   Intra-op Plan:   Post-operative Plan: Extubation in OR  Informed Consent: I have reviewed the patients History and Physical, chart, labs and discussed the procedure including the risks, benefits and alternatives for the proposed anesthesia with the patient or authorized representative who has indicated his/her understanding and acceptance.   Dental advisory given  Plan Discussed with: CRNA  Anesthesia Plan Comments:         Anesthesia Quick Evaluation

## 2011-10-15 NOTE — H&P (Signed)
Active Problems Problems  1. Lumbar Disc Degeneration 722.52 with neuropathy 2. Osteoarthritis Of The Hip Left 715.95 3. Postmenopausal Atrophic Vaginitis 627.3 4. Urge Incontinence Of Urine 788.31 5. Vaginal Wall Prolapse With Rectocele 618.04  History of Present Illness        The patient is a 78 year old female, complaining of chronic low back pain, sacroiliac pain, and vaginal pain.    She is status post Monarch sling in October 2005. The patient complains of lumbar pain with radiculopathy, as well as a past history of diabetic neuropathy. She is awakened with her groin pain, which is exacerbated with standing from a seated position, as well as with activities. Her low back pain that radiates to the left gluteal area, and lower extremity. She has superficial pain of the left ankle, described as burning pain with light touch. The patient complains of urinary frequency, urgency, urge incontinence, without painful urination, flank pain, fever, chills, or hematuria.    She has noted worsening rectal prolapse for the last 3 years, and Osie Bond this is the cause of her vaginal discomfort. The large rectal prolapse protrudes outside the vaginal opening. Stage III.  She is post hysterectomy strictly 40 years ago. She is para 3-2-0. There is no history of tobacco use. The patient has increased difficulty with defecation,but denies rectal incontinence. She has had a history, however, of chronic diarrhea.  Note the patient is post cystoscopy and Macroplastique injection of 2009 for recurrent stress urinary continence.  On physical examination, the patient was noted to have a normal urethra, with no evidence of diverticular formation, or tenderness, or hypermobility. There was no evidence of mesh extrusion. There was no atrophy, no discharge, and no evidence of apical prolapse. There was no cystocele, or enterocele palpable. The patient did have posterior vaginal wall prolapse, with a midline defect,  extruding outside the hymenal opening for greater than 2 cm.  The patient was felt to have a large, stage III  Rectocele, ( Grade 4) rectocele, with no complication identified from her anti-continence procedure. The patient does have diabetic neuropathy, and appears to have lumbar pain radiating to her lower extremities. Note that she is para 3-3 a zero, and is post-hysterectomy for many years. She hadurodynamics, which is accomplished with the rectocele present, and also reduced.  Urodynamics on 09/28/11 shows the patient has a bladder capacity of 550 cc. First sensation was at 381 cc, normal desire at 397 cc and strong desire at 458 cc. The bladder appears to be unstable at 350 cc, with a maximum bladder contraction pressure of 37 cm water. The patient's first void was secondary to bladder instability, and with the prolapse reduced.  Valsalva leak point pressure at 200 cc a 72 cm water, with leakage. There is no leakage, however, when her prolapse is reduced.  Pressure flow study showed a maximum flow rate of 10 cc per second, with detrusor pressure at maximum flow of 21 cm water. PVR is 250 cc, 300 cc. The PVR was obtained after the packing was removed.  This patient has a minimally hyposensitive detrusor, with first sensation 391 cc. She does have bladder instability. She has mild stress incontinence, but this does not appear to be her clinical problem. The patient does have symptomatic posterior vaginal wall prolapse. The patient did not empty well, leaving approximately 200 300 cc post void residual (by photo).   Past Medical History Problems  1. History of  Abdominal Pain Above The Pubic Area (Suprapubic) 789.09 2. History  of  Acute Urinary Retention 788.20 3. History of  Arthritis V13.4 4. History of  Diabetes Mellitus 250.00 5. History of  Endoscopic Injection Of Implant Material Into Bladder Neck 6. History of  Heartburn 787.1 7. History of  Murmurs 785.2 8. History of  Nephrolithiasis  V13.01 9. History of  Urge And Stress Incontinence 788.33 10. History of  Urinary Incontinence 788.30 11. History of  Urinary Symptoms 788.9  Surgical History Problems  1. History of  Cholecystectomy 2. History of  Hysterectomy V45.77 3. History of  Ovarian Cystectomy 4. History of  Urethra Surgery 5. History of  Vaginal Sling Operation For Stress Incontinence  Current Meds 1. Amiloride-Hydrochlorothiazide TABS; Therapy: (Recorded:06Feb2009) to 2. Aspirin 81 MG Oral Tablet; Therapy: (Recorded:25Nov2008) to 3. Cartia XT 180 MG/24HR CPCR; Therapy: (Recorded:06Mar2009) to 4. Estrace 0.1 MG/GM Vaginal Cream; APPLY 1/2" TO VAGINAL AREA TWICE WEEKLY; Therapy:  06Mar2009 to (Last Rx:06Mar2009) 5. HumuLIN N SUSP; Therapy: (Recorded:07Jan2009) to 6. NovoLOG SOLN; Therapy: (Recorded:25Nov2008) to 7. Tylenol TABS; Therapy: (Recorded:25Nov2008) to 8. Vitamin E 400 UNIT Oral Capsule; Therapy: (Recorded:06Mar2009) to  Allergies Medication  1. Ciprofloxacin HCl TABS 2. Sulfa Drugs  Family History Problems  1. Maternal history of  Adenocarcinoma Of The Cervix V16.49 2. Maternal history of  Breast Cancer V16.3 3. Sororal history of  Carcinoma Of The Stomach V16.0 4. Family history of  Family Health Status Number Of Children 1 son, 1 daughter 5. Family history of  Father Deceased At Age ____ 44, unknown 6. Family history of  Mother Deceased At Age ____ 53, unknown 7. Family history of  Nephrolithiasis  Social History Problems  1. Caffeine Use 1-2 per day diet mt dew; 1 1/2 cup coffee 2. Marital History - Currently Married 3. Never A Smoker Denied  4. Alcohol Use 5. Tobacco Use  Review of Systems Constitutional, skin, eye, otolaryngeal, hematologic/lymphatic, cardiovascular, pulmonary, endocrine, neurological and psychiatric system(s) were reviewed and pertinent findings if present are noted.  Genitourinary: urinary frequency, urinary urgency, nocturia, incontinence, urinary stream  starts and stops, incomplete emptying of bladder and pelvic pain pelvic pain.  Gastrointestinal: abdominal pain, diarrhea and constipation.  Musculoskeletal: back pain.    Vitals Vital Signs [Data Includes: Last 1 Day]  20Feb2013 01:18PM  Blood Pressure: 144 / 65 Temperature: 98 F Heart Rate: 84  Physical Exam Constitutional: Well nourished and well developed . No acute distress.  ENT:. The ears and nose are normal in appearance.  Neck: The appearance of the neck is normal and no neck mass is present.  Pulmonary: No respiratory distress and normal respiratory rhythm and effort.  Cardiovascular: Heart rate and rhythm are normal . No peripheral edema.  Abdomen: The abdomen is soft and nontender. No masses are palpated. No CVA tenderness. No hernias are palpable. No hepatosplenomegaly noted.  Genitourinary:. Rectal tone slightly decreasedl. Examination of the external genitalia shows normal female external genitalia and no lesions. The urethra is normal in appearance and not tender. There is no urethral mass. Vaginal exam demonstrates no abnormalities, no atrophy, no discharge, no tenderness and no uterine prolapse. No cystocele is identified. No enterocele is identified. A rectocele is present with a midline defect (grade 4 /4). The cervix is is absent. The uterus is absent. The adnexa are palpably normal. The bladder is normal on palpation, non tender and not distended. The anus is normal on inspection. The perineum is normal on inspection.  Skin: Normal skin turgor, no visible rash and no visible skin lesions.  Neuro/Psych:. Mood and  affect are appropriate.    Results/Data Urine [Data Includes: Last 1 Day]   20Feb2013  COLOR YELLOW   APPEARANCE CLEAR   SPECIFIC GRAVITY 1.010   pH 7.0   GLUCOSE NEG mg/dL  BILIRUBIN NEG   KETONE NEG mg/dL  BLOOD NEG   PROTEIN NEG mg/dL  UROBILINOGEN 0.2 mg/dL  NITRITE NEG   LEUKOCYTE ESTERASE TRACE   SQUAMOUS EPITHELIAL/HPF FEW   WBC 0-3 WBC/hpf    RBC NONE SEEN RBC/hpf  BACTERIA NONE SEEN   CRYSTALS NONE SEEN   CASTS NONE SEEN    Assessment Assessed  1. Postmenopausal Atrophic Vaginitis 627.3 2. Urge Incontinence Of Urine 788.31 3. Vaginal Wall Prolapse With Rectocele 618.04      1. Large Rectocele. Posterior vaginal wall prolapse Stage III, 2-3cm external to the Hyminal ring as major complaint. She has associated chronic diarrhea, but I am wondering if she may have chronic constipation with diarrhea sliding around the constipation. She has a decreased rectal sphincter tone.    She is para3-2, and is post hysterectomy in the distant past. Her stress incontinence was treated with. Monarch sling in the ast 10 yrs, and Macroplatique in 2009, but she still has some urodynamic evidence of low leak point pressure-but incontinence is not her major complaint. I have discussed her anatomy, and physiology problems, and I think we should proceed with posterior vaginal wall prolapse repair, with sacrospinus fixation. She will continue to clean out her rectum, snd we will not need any additional incontinence Rx at this time. We have discussed the mesh lawsuits, and we have reviewed the indications for her surgery and the alternative to vault repair. She does not need her sling mesh removed, and I have reassurred her about this.    She needs vaginal vault Femring for local estrogen and also to help keep her posterior vaginal wall in place. We will start this post op. She may need PT post op for rectal incintinence exercises..   Plan  Vaginal Wall Prolapse With Rectocele (161.09)      Posterior vaginal vault repair. Attention to her diabetes.  Post op possible PT evaluation.  UA With REFLEX  Status: Resulted - Requires Verification  Done: 01Jan0001 12:00AM Ordered Today; For: Health Maintenance (V70.0); Ordered By: Jethro Bolus  Due: 22Feb2013 Marked Important; Last Updated By: Nathaniel Man   Signatures Electronically signed by : Jethro Bolus, M.D.; Sep 30 2011  2:31PM

## 2011-10-16 LAB — GLUCOSE, CAPILLARY: Glucose-Capillary: 105 mg/dL — ABNORMAL HIGH (ref 70–99)

## 2011-10-16 LAB — POCT I-STAT 4, (NA,K, GLUC, HGB,HCT)
HCT: 42 % (ref 36.0–46.0)
Hemoglobin: 14.3 g/dL (ref 12.0–15.0)

## 2011-10-16 MED ORDER — HYDROCODONE-ACETAMINOPHEN 7.5-650 MG PO TABS: 1.0000 | ORAL_TABLET | Freq: Four times a day (QID) | ORAL | Status: AC | PRN

## 2011-10-16 MED ORDER — TRIMETHOPRIM 100 MG PO TABS: 100.0000 mg | ORAL_TABLET | ORAL | Status: AC

## 2011-10-16 NOTE — Discharge Instructions (Signed)
Anterior and Posterior Repair, Colporraphy Anterior and posterior repair colporraphy is an operation used to fix a prolapse of organs in the genital tract. Prolapse means the falling down, bulging, dropping or drooping of an organ. Organs that commonly prolapse include the rectum, bladder, vagina and uterus. Prolapse can affect a single organ or several organs at the same time. This often worsens when women stop having their monthly periods (menopause) because estrogen loss weakens the muscles and tissues in the genital tract. In addition, prolapse happens when the organs are damaged or weakened. This commonly happens after childbirth and from aging.   Some women feel pelvic pressure or have trouble holding their urine right after childbirth because of stretching and damage to pelvic tissues around the bladder.   Some women have trouble going to the bathroom (defecating) because of trapped stool in the rectum. This is because of damaged tissue around the rectum.  These troubles generally get better with time, but may get worse or return with aging. Different types of surgery are often the only form of treatment for severe prolapses.  TYPES OF GENITAL PROLAPSE YOUR CAREGIVER MAY DISCUSS ARE:  A cystocele is a prolapse of the upper (anterior) wall of the vagina. The anterior wall bulges into the vagina and brings the bladder with it.   A rectocele is a prolapse of the lower (posterior) wall of the vagina. The posterior vaginal wall bulges into the vagina and brings the rectum with it.   An enterocele is a prolapse of part of the pelvic organs called the Pouch of [place]. It also involves a portion of the small bowel. It appears as a bulge under the cervix (neck of the womb) at the top of the back wall of the vagina.   A procidentia is a complete prolapse of the uterus and the cervix. The prolapse can be seen and felt coming out of the vagina.   If the uterus is prolapsed, the uterus may be removed  (hysterectomy). Your caregiver will discuss the risks and benefits with you.  LET YOUR CAREGIVER KNOW ABOUT:   Allergies to foods and medications.   All the medications you are taking including herbs, over-the-counter medications, eye drops and creams.   Use of illegal drugs.   Smoking or heavy alcohol drinking habits.   Past problems with anesthetics or novocaine.   Possible pregnancy, if this applies.   History of blood clots or other types of blood problems.   History of bleeding problems.   Past surgery.   Other medical or health problems.  RISKS AND COMPLICATIONS  Infection. A germ starts growing in the wound. This can usually be treated with antibiotics.   Damage to other organs during surgery.   Bleeding after surgery. Your surgeon takes every precaution to keep this from happening.   Problems urinating. A catheter may be required for a few days.   Prolapse may happen again.   Problems from the anesthesia.  BEFORE THE PROCEDURE  Do not take aspirin or blood thinners a week before your surgery unless told otherwise.   Do not eat or drink anything 8 hours before your surgery.   Let your caregiver know if you get a cold or an infection before your surgery.   Plan and arrange for help when you go home from the hospital.   If you smoke, do not smoke for at least 2 weeks before the surgery.  PROCEDURE  Usually you are given medication to relax you before the surgery.  An IV will be placed in an arm vein for the anesthetic and medications. The anesthesia, spinal, or epidural puts you to sleep. You will be awake but will be free of pain during surgery.  Different repairs include:  An anterior repair. A cut (incision) is made in the midline section of the front part of the vaginal wall. A triangular shaped piece of vaginal tissue is removed and the stronger healthier tissue is sewn together in order to re-support and suspend the bladder.   A posterior repair. An  incision is made midline on the back wall of the vagina. A triangular portion of vaginal skin is removed to expose the muscle. Excess tissue is removed and stronger, healthier muscle and ligament tissue is sewn together to support the rectum.   In an A and P repair, both of the above procedures are done during the same operation.  AFTER THE PROCEDURE You will be taken to a recovery area. Nurses will check your progress and monitor your vital signs (blood pressure, pulse, breathing and temperature). This is done until you are stable. Then you will be transferred to your room. After surgery, you will have a urinary catheter (a small rubber tube to drain your bladder). This will be in place for 2 to 7 days or until your bladder is working properly on its own. The intravenous will be removed in 1 to 3 days. You may have a gauze packing in your vagina to prevent bleeding that will be removed 2 or 3 days after the surgery. You are usually in the hospital 3 to 5 days. SEEK IMMEDIATE MEDICAL CARE IF:   Increased bleeding (more than a small spot) from the vagina develops.   You notice redness, swelling, or increasing pain in vaginal area.   You develop abdominal pain or pain which is getting worse rather than better.   There is pus coming from wounds.   An unexplained oral temperature over 101 F (38.3 C) develops.   There is a bad smell coming from your vaginal area.   You feel lightheaded or faint.  Document Released: 10/17/2003 Document Revised: 07/16/2011 Document Reviewed: 02/17/2008 [facility] Patient Information 2012 [place], [place].Rectocele/Enterocele Care After A woman's birth canal (vagina) can become weak or stretched. This can be caused by childbirth, heavy lifting, lasting (chronic) constipation, aging, or pelvic surgery. When the vagina is weak and stretched, parts of the intestine can bulge into the vagina by pushing against the vaginal walls. A rectocele is when the very end of the  large intestine (rectum) causes the bulge. An enterocele is when the small intestine causes the bulge. Surgery to fix this problem is usually done through the vagina. If you just had this surgery, you were probably given a drug to make you sleep (general anesthetic) or a drug that numbs you from the waist down (spinal/epidural). Here is what happened:  The small intestine or rectum was pushed back to its normal place.   The vaginal wall was made stronger. Sometimes this is done with stitches or a mesh-like material.  HOME CARE INSTRUCTIONS  Some women go home the same day as their surgery. Others stay in the hospital for a few days. This depends on the size and type of repair.  Pain and Medications  Some pain is normal after this surgery. Only take pain medicine your surgeon prescribed. Follow the directions carefully.   Do not take aspirin. It can cause bleeding.   Do not drink alcohol while taking pain medication.  You may be given a medicine (antibiotic) that kills germs. Follow the directions carefully.   Take warm sitz baths 2 times a day to control discomfort and reduce any swelling. Take sitz baths with your caregiver's permission.  Diet  Go back to your normal eating as directed by your caregiver.   Drink a lot of fluids. Drink at least 6 glasses of water every day.  Activity  Move around and walk as much as possible. This can keep blood clots from forming in your legs.   Do not climb stairs until your caregiver says it is okay.   Do not lift objects 5 pounds (2.3 kg) or heavier. Do not bend or strain for 6 to 8 weeks.   Do not drive until after you stop taking pain medicine and your caregiver says it is okay.   Your return to work will depend on the type of work you do. Ask your caregiver what is best for you.   Ask your caregiver when you can resume sexual activity. Most women can start having sex in about 6 weeks after their surgery.   Get plenty of rest during the  day and sleep at night.   Have someone help you with your household chores and activities for 3 to 4 weeks.  Other Precautions  You may have some discharge from the vagina for a few weeks after the surgery. It may have small amounts of blood in it. This is normal. If you have questions, ask your caregiver.   Do not use tampons or douche.   You should be able to take a shower a day after your surgery. Do not take a tub bath for at least a week.   Take it easy for awhile. You should feel much better in 2 to 3 weeks. It may take up to 6 weeks to feel completely normal.   Keep all follow-up appointments.   Take your temperature twice a day and write it down.   Make sure your family understands everything about your surgery and recovery.  SEEK MEDICAL CARE IF:   You have any questions about your medication, or you need stronger pain medication.   Pain continues, even after taking pain medication.   You become constipated.   You have an oral temperature above 102 F (38.9 C).   You develop swelling and redness in the surgery area.   You become dizzy or lightheaded.   You feel sick to your stomach (nauseous), throw up (vomit), or have diarrhea.   You develop a rash.   You have a reaction to your medications.  SEEK IMMEDIATE MEDICAL CARE IF:   Pain gets worse.   You have new bleeding from your vagina.   Discharge from the vagina becomes heavy, or it has a bad smell.   You have an oral temperature above 102 F (38.9 C), not controlled by medicine.   You develop belly (abdominal) pain.   You develop chest pain.   You develop shortness of breath.   You pass out (faint).   You develop pain, swelling, or redness in the leg.   You have pain or burning with urination.   You have bloody urine or cannot urinate.  MAKE SURE YOU:   Understand these instructions.   Will watch your condition.   Will get help right away if you are not doing well or get worse.  Document  Released: 10/21/2009 Document Revised: 07/16/2011 Document Reviewed: 10/21/2009 [facility] Patient Information 2012 [place], [place].

## 2011-10-16 NOTE — Progress Notes (Signed)
Urology Progress Note  1 Day Post-Op   Subjective:     No acute urologic events overnight.  Voided this AM.  Ambulation: yes   Flatus:  yes  Bowel movement :  no  Pain: complete resolution  Objective:  Blood pressure 123/67, pulse 73, temperature 98.2 F (36.8 C), temperature source Oral, resp. rate 18, height 5' 4.5" (1.638 m), weight 76.204 kg (168 lb), SpO2 95.00%.  Physical Exam:  General:  No acute distress, awake Extremities: extremities normal, atraumatic, no cyanosis or edema Genitourinary:  Normal BUS Foley:  D/C    I/O last 3 completed shifts: In: 3570 [P.O.:1320; I.V.:2000; Other:250] Out: 755 [Urine:755]  Recent Labs  [facility] 10/15/11 0747   HGB 14.3   WBC --   PLT --    Recent Labs  [facility] 10/15/11 0747   NA 142   K 4.1   CL --   CO2 --   BUN --   CREATININE --   CALCIUM --   GFRNONAA --   GFRAA --     No results found for this basename: PT:2,INR:2,APTT:2 in the last 72 hours   No components found with this basename: ABG:2  Assessment/Plan:  Pt is to increase activities as tolerated. Drive in 5 days. No heavy lifting.

## 2011-10-16 NOTE — Discharge Summary (Signed)
Physician Discharge Summary  Patient ID: [name] MRN: [id number] DOB/AGE: 1933/01/18 78 y.o.  Admit date: 10/15/2011 Discharge date: 10/16/2011  Admission Diagnoses:  Discharge Diagnoses:  Active Problems:  * No active hospital problems. *    Discharged Condition: good  Hospital Course: posterior vaginal wall repair  Consults: none  Significant Diagnostic Studies: none  Treatments: posterior vaginal wasll repair  Discharge Exam: Blood pressure 123/67, pulse 73, temperature 98.2 F (36.8 C), temperature source Oral, resp. rate 18, height 5' 4.5" (1.638 m), weight 76.204 kg (168 lb), SpO2 95.00%. General appearance: alert and cooperative GI: normal findings: bowel sounds normal and no masses palpable Extremities: extremities normal, atraumatic, no cyanosis or edema  Disposition:   Discharge Orders    Future Appointments: Provider: Department: Dept Phone: Center:   01/25/2012 7:30 AM Sherrie George, MD [facility] [phone] None     Future Orders Please Complete By Expires   Diet - low sodium heart healthy      Increase activity slowly      Discontinue IV        Medication List  As of 10/16/2011  8:43 AM   STOP taking these medications         aspirin 81 MG tablet      HYDROcodone-acetaminophen 5-325 MG per tablet      vitamin E 400 UNIT capsule         TAKE these medications         amiloride-hydrochlorothiazide 5-50 MG tablet   Commonly known as: MODURETIC   Take 1 tablet by mouth daily.      cholecalciferol 400 UNITS Tabs   Commonly known as: VITAMIN D   Take 1,000 Units by mouth.      hydrocodone-acetaminophen 7.5-650 MG per tablet   Commonly known as: LORCET PLUS   Take 1 tablet by mouth every 6 (six) hours as needed for pain.      insulin lispro 100 UNIT/ML injection   Commonly known as: HUMALOG   Inject into the skin 3 (three) times daily with meals as needed.      insulin NPH 100 UNIT/ML injection   Commonly known as: HUMULIN  N,NOVOLIN N   Inject 72 Units into the skin daily before breakfast. Adjusts to keep CBG-range 80-100      potassium chloride 20 MEQ packet   Commonly known as: KLOR-CON   Take 20 mEq by mouth daily.      trimethoprim 100 MG tablet   Commonly known as: TRIMPEX   Take 1 tablet (100 mg total) by mouth 1 day or 1 dose.           Follow-up Information    Follow up with Jethro Bolus I, MD. (per prior appointment)    Contact information:   [address] [place], [address] [facility] [place] [address] [phone]          Signed: Jethro Bolus I 10/16/2011, 8:43 AM

## 2011-10-26 ENCOUNTER — Encounter (HOSPITAL_BASED_OUTPATIENT_CLINIC_OR_DEPARTMENT_OTHER): Payer: Self-pay | Admitting: Urology

## 2011-12-04 ENCOUNTER — Emergency Department (HOSPITAL_COMMUNITY): Payer: Medicare Other

## 2011-12-04 ENCOUNTER — Emergency Department (HOSPITAL_COMMUNITY)
Admission: EM | Admit: 2011-12-04 | Discharge: 2011-12-04 | Disposition: A | Discharge status: Home / Self Care | Payer: Medicare Other | Attending: Emergency Medicine | Admitting: Emergency Medicine

## 2011-12-04 ENCOUNTER — Encounter (HOSPITAL_COMMUNITY): Payer: Self-pay | Admitting: Emergency Medicine

## 2011-12-04 DIAGNOSIS — I10 Essential (primary) hypertension: Secondary | ICD-10-CM | POA: Insufficient documentation

## 2011-12-04 DIAGNOSIS — M545 Low back pain, unspecified: Secondary | ICD-10-CM | POA: Insufficient documentation

## 2011-12-04 DIAGNOSIS — Z79899 Other long term (current) drug therapy: Secondary | ICD-10-CM | POA: Insufficient documentation

## 2011-12-04 DIAGNOSIS — K219 Gastro-esophageal reflux disease without esophagitis: Secondary | ICD-10-CM | POA: Insufficient documentation

## 2011-12-04 DIAGNOSIS — M5432 Sciatica, left side: Secondary | ICD-10-CM

## 2011-12-04 DIAGNOSIS — Z8739 Personal history of other diseases of the musculoskeletal system and connective tissue: Secondary | ICD-10-CM | POA: Insufficient documentation

## 2011-12-04 DIAGNOSIS — E119 Type 2 diabetes mellitus without complications: Secondary | ICD-10-CM | POA: Insufficient documentation

## 2011-12-04 DIAGNOSIS — M5430 Sciatica, unspecified side: Secondary | ICD-10-CM | POA: Insufficient documentation

## 2011-12-04 MED ORDER — IBUPROFEN 200 MG PO TABS
400.0000 mg | ORAL_TABLET | Freq: Once | ORAL | Status: AC
  Administered 2011-12-04: 400 mg via ORAL
  Filled 2011-12-04: qty 2

## 2011-12-04 MED ORDER — ONDANSETRON 4 MG PO TBDP
4.0000 mg | ORAL_TABLET | Freq: Once | ORAL | Status: AC
  Administered 2011-12-04: 4 mg via ORAL
  Filled 2011-12-04: qty 1

## 2011-12-04 MED ORDER — HYDROCODONE-ACETAMINOPHEN 5-500 MG PO TABS: 1.0000 | ORAL_TABLET | Freq: Four times a day (QID) | ORAL | Status: DC | PRN

## 2011-12-04 MED ORDER — HYDROCODONE-ACETAMINOPHEN 5-325 MG PO TABS
2.0000 | ORAL_TABLET | Freq: Once | ORAL | Status: AC
  Administered 2011-12-04: 2 via ORAL
  Filled 2011-12-04: qty 2

## 2011-12-04 MED ORDER — FENTANYL CITRATE 0.05 MG/ML IJ SOLN
50.0000 ug | Freq: Once | INTRAMUSCULAR | Status: AC
  Administered 2011-12-04: 50 ug via INTRAMUSCULAR
  Filled 2011-12-04: qty 2

## 2011-12-04 NOTE — ED Notes (Signed)
Pt unable to ambulate increase pain with wt bearing while standing with at site  of bed

## 2011-12-04 NOTE — ED Notes (Signed)
Pt presented to the ER with c/o bilateral leg pain, pt states that this sx are lasting for years now, however, pt reports that she was also Dx with sciatica and that this am pt was not able to bear wt to the left leg, needed assist for ambulation, secondary to pain in both legs.

## 2011-12-04 NOTE — Discharge Instructions (Signed)
Do not use tramadol-acetaminophen if you choose to use hydrocodone-acetaminophen. Also, do not take Tylenol (acetaminophen) while taking hydrocodone-acetaminophen. You may alternate the hydrocodone-acetaminophen with ibuprofen for additional pain relief and anti-inflammatory effects. Call Dr. Jone Baseman office today to schedule close followup appointment next week for recheck of ongoing pain however return to emergency department for any changing or worsening of symptoms as discussed.  Sciatica Sciatica is a weakness and/or changes in sensation (tingling, jolts, hot and cold, numbness) along the path the sciatic nerve travels. Irritation or damage to lumbar nerve roots is often also referred to as lumbar radiculopathy.  Lumbar radiculopathy (Sciatica) is the most common form of this problem. Radiculopathy can occur in any of the nerves coming out of the spinal cord. The problems caused depend on which nerves are involved. The sciatic nerve is the large nerve supplying the branches of nerves going from the hip to the toes. It often causes a numbness or weakness in the skin and/or muscles that the sciatic nerve serves. It also may cause symptoms (problems) of pain, burning, tingling, or electric shock-like feelings in the path of this nerve. This usually comes from injury to the fibers that make up the sciatic nerve. Some of these symptoms are low back pain and/or unpleasant feelings in the following areas:  From the mid-buttock down the back of the leg to the back of the knee.   And/or the outside of the calf and top of the foot.   And/or behind the inner ankle to the sole of the foot.  CAUSES   Herniated or slipped disc. Discs are the little cushions between the bones in the back.   Pressure by the piriformis muscle in the buttock on the sciatic nerve (Piriformis Syndrome).   Misalignment of the bones in the lower back and buttocks (Sacroiliac Joint Derangement).   Narrowing of the spinal canal  that puts pressure on or pinches the fibers that make up the sciatic nerve.   A slipped vertebra that is out of line with those above or beneath it.   Abnormality of the nervous system itself so that nerve fibers do not transmit signals properly, especially to feet and calves (neuropathy).   Tumor (this is rare).  Your caregiver can usually determine the cause of your sciatica and begin the treatment most likely to help you. TREATMENT  Taking over-the-counter painkillers, physical therapy, rest, exercise, spinal manipulation, and injections of anesthetics and/or steroids may be used. Surgery, acupuncture, and Yoga can also be effective. Mind over matter techniques, mental imagery, and changing factors such as your bed, chair, desk height, posture, and activities are other treatments that may be helpful. You and your caregiver can help determine what is best for you. With proper diagnosis, the cause of most sciatica can be identified and removed. Communication and cooperation between your caregiver and you is essential. If you are not successful immediately, do not be discouraged. With time, a proper treatment can be found that will make you comfortable. HOME CARE INSTRUCTIONS   If the pain is coming from a problem in the back, applying ice to that area for 15 to 20 minutes, 3 to 4 times per day while awake, may be helpful. Put the ice in a plastic bag. Place a towel between the bag of ice and your skin.   You may exercise or perform your usual activities if these do not aggravate your pain, or as suggested by your caregiver.   Only take over-the-counter or prescription medicines for  pain, discomfort, or fever as directed by your caregiver.   If your caregiver has given you a follow-up appointment, it is very important to keep that appointment. Not keeping the appointment could result in a chronic or permanent injury, pain, and disability. If there is any problem keeping the appointment, you must  call back to this facility for assistance.  SEEK IMMEDIATE MEDICAL CARE IF:   You experience loss of control of bowel or bladder.   You have increasing weakness in the trunk, buttocks, or legs.   There is numbness in any areas from the hip down to the toes.   You have difficulty walking or keeping your balance.   You have any of the above, with fever or forceful vomiting.  Document Released: 07/21/2001 Document Revised: 07/16/2011 Document Reviewed: 03/09/2008 [facility] Patient Information 2012 [place], [place].

## 2011-12-04 NOTE — ED Notes (Signed)
Urine specimen collected at bedside

## 2011-12-04 NOTE — ED Provider Notes (Signed)
History     CSN: [id number]  Arrival date & time 12/04/11  0541   First MD Initiated Contact with Patient 12/04/11 [phone]      Chief Complaint  Patient presents with  . Leg Pain    (Consider location/radiation/quality/duration/timing/severity/associated sxs/prior treatment) HPI  Patient presents to emergency department with her husband at bedside with complaint of bilateral lower back pain with radiation of pain into her left buttock, hip, and posterior thigh. Patient states pain is similar to the pain she's been having intermittently over the last few weeks. Patient states that she has seen her primary care physician who diagnosed her with sciatica 2 weeks ago. Patient states at that time her primary care physician started on prednisone, Motrin, and as needed Tylenol. Patient states that she finished the prednisone and went back to see him with some intermittent ongoing pain but with some improvement and he switched her from acetaminophen to tramadol-acetaminophen. Patient states that she has been taking tramadol-acetaminophen with some relief of pain however she states she woke this morning was unable to bear weight do to increase severity of pain in her left buttock and thigh. Patient took nothing for pain this morning PTA. Patient denies any saddle seat paresthesias, loss of bowel or bladder function, or extremity numbness/tingling/weakness. She denies any injury to her back. Patient states symptoms were gradual onset, intermittent, and persistent. Patient states pain is similar to the pain that she's been having on and off for the last few weeks however more severe this morning.  Past Medical History  Diagnosis Date  . Diabetes mellitus     greater than 30 years  . Hypertension   . Incontinence of urine   . Diarrhea   . Neuropathy     bilateral feet  . Arthritis     lower back/hands  . GERD (gastroesophageal reflux disease)     Past Surgical History  Procedure Date  . Eye  surgery     bil.cataract-unknown year  . Incontinence surgery 2005  . Tonsillectomy 1964  . Abdominal hysterectomy     1982  . Ovarian cyst surgery 1972  . Breast biopsy 1990  . Cholecystectomy 1988  . Cystoscopy 2009    macroplastique inj  . Rectocele repair 10/15/2011    Procedure: POSTERIOR REPAIR (RECTOCELE);  Surgeon: Kathi Ludwig, MD;  Location: [facility];  Service: Urology;;  posterior vaginal vault repair with sacral spinus repair with graft  . Cystoscopy 10/15/2011    Procedure: CYSTOSCOPY;  Surgeon: Kathi Ludwig, MD;  Location: [facility];  Service: Urology;  Laterality: N/A;    History reviewed. No pertinent family history.  History  Substance Use Topics  . Smoking status: Never Smoker   . Smokeless tobacco: Not on file  . Alcohol Use: No    OB History    Grav Para Term Preterm Abortions TAB SAB Ect Mult Living                  Review of Systems  All other systems reviewed and are negative.    Allergies  Neosporin and Sulfa antibiotics  Home Medications   Current Outpatient Rx  Name Route Sig Dispense Refill  . AMILORIDE-HYDROCHLOROTHIAZIDE 5-50 MG PO TABS Oral Take 1 tablet by mouth daily.    . ASPIRIN EC 81 MG PO TBEC Oral Take 81 mg by mouth daily.    . CHOLECALCIFEROL 400 UNITS PO TABS Oral Take 1,000 Units by mouth.    . INSULIN  LISPRO (HUMAN) 100 UNIT/ML [place] SOLN Subcutaneous Inject 0-10 Units into the skin 3 (three) times daily with meals as needed.     . INSULIN ISOPHANE HUMAN 100 UNIT/ML  SUSP Subcutaneous Inject 72 Units into the skin daily before breakfast. Adjusts to keep CBG-range 80-100    . POTASSIUM CHLORIDE 20 MEQ PO PACK Oral Take 20 mEq by mouth daily.      BP 133/97  Pulse 102  Temp(Src) 98.1 F (36.7 C) (Oral)  Resp 16  SpO2 99%  Physical Exam  Nursing note and vitals reviewed. Constitutional: She is oriented to person, place, and time. She appears well-developed and  well-nourished. No distress.  HENT:  Head: Normocephalic and atraumatic.  Eyes: Conjunctivae are normal.  Neck: Normal range of motion. Neck supple.  Cardiovascular: Normal rate, regular rhythm, normal heart sounds and intact distal pulses.  Exam reveals no gallop and no friction rub.   No murmur heard. Pulmonary/Chest: Effort normal and breath sounds normal. No respiratory distress. She has no wheezes. She has no rales. She exhibits no tenderness.  Abdominal: Soft. Bowel sounds are normal. She exhibits no distension and no mass. There is no tenderness. There is no rebound and no guarding.  Genitourinary:       Good rectal tone. No TTP.   Musculoskeletal: Normal range of motion. She exhibits tenderness. She exhibits no edema.       TTP of left lower lumbar paraspinal region and sacral region. No skin changes.   Pelvis stable and non tender. Negative straight leg raise. FROM of bilateral LE.   Neurological: She is alert and oriented to person, place, and time. She has normal reflexes.  Skin: Skin is warm and dry. No rash noted. She is not diaphoretic. No erythema.  Psychiatric: She has a normal mood and affect.    ED Course  Procedures (including critical care time)  By mouth had will, hydrocodone acetaminophen, and ODT Zofran  Labs Reviewed - No data to display Dg Lumbar Spine Complete  12/04/2011  *RADIOLOGY REPORT*  Clinical Data: Low back pain and bilateral leg pain.  LUMBAR SPINE - COMPLETE 4+ VIEW  Comparison: No priors.  Comparison is made with prior sagittal reconstructions from CT of the abdomen and pelvis 03/11/2011.  Findings: There is a compression fracture of the superior endplate of T12 with approximately 15% loss of anterior vertebral body height (this is unchanged).  No additional acute displaced fractures or compression type fractures are identified.  There is significant multilevel degenerative disc disease, most pronounced at the L3-L4 where there are advanced  degenerative changes of the adjacent vertebral body endplates.  Multilevel facet arthropathy is also seen throughout the lumbar spine, most severe at L4-L5 and L5- S1.  Alignment is anatomic on the lateral projection, and there is a very mild levoscoliosis of the lumbar spine centered at L3 on the frontal projection.  Atherosclerosis of the visualized abdominal vasculature.  Surgical clips projecting over the right upper quadrant of the abdomen, likely from prior cholecystectomy.  IMPRESSION: 1.  Multilevel degenerative disc disease and lumbar spondylosis without acute radiographic abnormality of the lumbar spine to account for the patient's symptoms, as above. 2.  Old compression fracture of T12 is unchanged, as above. 3.  Atherosclerosis. 4.  Status post cholecystectomy.  Original Report Authenticated By: Florencia Reasons, M.D.     1. Sciatica of left side   2. Lower back pain       MDM  Patient is now able to stand  and bear weight and angulated to the bathroom with minimal assistance complaining of mild ongoing pain but much improved since arrival. Patient has no red flags for worrisome back pain. No signs or symptoms of cauda equina. Bilateral lower extremities are neurovascularly intact. She has no abdominal pain. Spoke at length with patient about changing or worsening symptoms that should prompt immediate return to emergency department otherwise to followup with her primary care provider for reevaluation of ongoing mild pain. She voices her understanding and is agreeable to plan.        Jenness Corner, [place] 12/04/11 1119

## 2011-12-04 NOTE — ED Notes (Signed)
Patient ambulated to restroom with standby assist.  

## 2011-12-04 NOTE — ED Notes (Signed)
Pt st's she hasn't been able to walk for the past couple of days.  St's she cannot bear weight on her L leg.  St's she is having pain down the backs of both legs and into her back.  Pt unable to walk.

## 2011-12-04 NOTE — ED Notes (Signed)
Patient transported to X-ray 

## 2011-12-04 NOTE — ED Provider Notes (Signed)
Medical screening examination/treatment/procedure(s) were performed by non-physician practitioner and as supervising physician I was immediately available for consultation/collaboration.   Laray Anger, DO 12/04/11 2219

## 2011-12-07 ENCOUNTER — Other Ambulatory Visit: Payer: Self-pay | Admitting: Internal Medicine

## 2011-12-07 DIAGNOSIS — M545 Low back pain: Secondary | ICD-10-CM

## 2011-12-08 ENCOUNTER — Emergency Department (HOSPITAL_COMMUNITY): Payer: Medicare Other

## 2011-12-08 ENCOUNTER — Encounter (HOSPITAL_COMMUNITY): Payer: Self-pay | Admitting: Emergency Medicine

## 2011-12-08 ENCOUNTER — Emergency Department (HOSPITAL_COMMUNITY)
Admission: EM | Admit: 2011-12-08 | Discharge: 2011-12-09 | Disposition: A | Discharge status: Home / Self Care | Payer: Medicare Other | Attending: Emergency Medicine | Admitting: Emergency Medicine

## 2011-12-08 DIAGNOSIS — E876 Hypokalemia: Secondary | ICD-10-CM | POA: Insufficient documentation

## 2011-12-08 DIAGNOSIS — R599 Enlarged lymph nodes, unspecified: Secondary | ICD-10-CM | POA: Insufficient documentation

## 2011-12-08 DIAGNOSIS — R109 Unspecified abdominal pain: Secondary | ICD-10-CM | POA: Insufficient documentation

## 2011-12-08 DIAGNOSIS — M5430 Sciatica, unspecified side: Secondary | ICD-10-CM | POA: Insufficient documentation

## 2011-12-08 LAB — COMPREHENSIVE METABOLIC PANEL
ALT: 19 U/L (ref 0–35)
Alkaline Phosphatase: 99 U/L (ref 39–117)
CO2: 25 mEq/L (ref 19–32)
Chloride: 95 mEq/L — ABNORMAL LOW (ref 96–112)
GFR calc Af Amer: >90 mL/min (ref >90)
GFR calc non Af Amer: 83 mL/min — ABNORMAL LOW (ref >90)
Glucose, Bld: 109 mg/dL — ABNORMAL HIGH (ref 70–99)
Potassium: 3 mEq/L — ABNORMAL LOW (ref 3.5–5.1)
Sodium: 132 mEq/L — ABNORMAL LOW (ref 135–145)
Total Bilirubin: 0.5 mg/dL (ref 0.3–1.2)

## 2011-12-08 LAB — GLUCOSE, CAPILLARY: Glucose-Capillary: 115 mg/dL — ABNORMAL HIGH (ref 70–99)

## 2011-12-08 MED ORDER — ONDANSETRON HCL 4 MG/2ML IJ SOLN
4.0000 mg | Freq: Once | INTRAMUSCULAR | Status: AC
  Administered 2011-12-08: 4 mg via INTRAVENOUS
  Filled 2011-12-08: qty 2

## 2011-12-08 MED ORDER — HYDROMORPHONE HCL PF 1 MG/ML IJ SOLN: 1.0000 mg | Freq: Once | INTRAMUSCULAR | Status: DC

## 2011-12-08 MED ORDER — SODIUM CHLORIDE 0.9 % IV BOLUS (SEPSIS)
500.0000 mL | Freq: Once | INTRAVENOUS | Status: AC
  Administered 2011-12-08: 500 mL via INTRAVENOUS

## 2011-12-08 MED ORDER — HYDROMORPHONE HCL PF 1 MG/ML IJ SOLN
1.0000 mg | Freq: Once | INTRAMUSCULAR | Status: AC
  Administered 2011-12-08: 1 mg via INTRAMUSCULAR
  Filled 2011-12-08: qty 1

## 2011-12-08 NOTE — ED Provider Notes (Signed)
History     CSN: [id number]  Arrival date & time 12/08/11  2049   First MD Initiated Contact with Patient 12/08/11 2233      Chief Complaint  Patient presents with  . Abdominal Pain  . Constipation    (Consider location/radiation/quality/duration/timing/severity/associated sxs/prior treatment) HPI Pt initially complaining of constipation esp LLQ pain and has not had significant BM for 5 days. Recently seen and treated for L sciatica. Has not had pain meds to day and now with increasing L lumbar pain radiating down L leg. No saddle anesthesia. No focal weakness. No fever, chills. Outpt MRI scheduled for tomorrow.   Past Medical History  Diagnosis Date  . Diabetes mellitus     greater than 30 years  . Hypertension   . Incontinence of urine   . Diarrhea   . Neuropathy     bilateral feet  . Arthritis     lower back/hands  . GERD (gastroesophageal reflux disease)     Past Surgical History  Procedure Date  . Eye surgery     bil.cataract-unknown year  . Incontinence surgery 2005  . Tonsillectomy 1964  . Abdominal hysterectomy     1982  . Ovarian cyst surgery 1972  . Breast biopsy 1990  . Cholecystectomy 1988  . Cystoscopy 2009    macroplastique inj  . Rectocele repair 10/15/2011    Procedure: POSTERIOR REPAIR (RECTOCELE);  Surgeon: Kathi Ludwig, MD;  Location: [facility];  Service: Urology;;  posterior vaginal vault repair with sacral spinus repair with graft  . Cystoscopy 10/15/2011    Procedure: CYSTOSCOPY;  Surgeon: Kathi Ludwig, MD;  Location: [facility];  Service: Urology;  Laterality: N/A;    Family History  Problem Relation Age of Onset  . Osteoarthritis Sister   . Osteoarthritis Brother     History  Substance Use Topics  . Smoking status: Never Smoker   . Smokeless tobacco: Not on file  . Alcohol Use: No    OB History    Grav Para Term Preterm Abortions TAB SAB Ect Mult Living                  Review of  Systems  Constitutional: Negative for fever and chills.  Respiratory: Negative for shortness of breath.   Cardiovascular: Positive for chest pain.  Gastrointestinal: Positive for abdominal pain and constipation. Negative for nausea, vomiting and diarrhea.  Musculoskeletal: Positive for back pain. Negative for myalgias and joint swelling.  Skin: Negative for rash and wound.  Neurological: Negative for weakness and numbness.    Allergies  Neosporin and Sulfa antibiotics  Home Medications   Current Outpatient Rx  Name Route Sig Dispense Refill  . AMILORIDE-HYDROCHLOROTHIAZIDE 5-50 MG PO TABS Oral Take 1 tablet by mouth daily.    . ASPIRIN EC 81 MG PO TBEC Oral Take 81 mg by mouth daily.    Marland Kitchen VITAMIN D 1000 UNITS PO TABS Oral Take 1,000 Units by mouth daily.    . INSULIN LISPRO (HUMAN) 100 UNIT/ML [place] SOLN Subcutaneous Inject 3-5 Units into the skin 3 (three) times daily as needed. For CBG > 150; sliding scale.    Marland Kitchen POTASSIUM CHLORIDE CRYS ER 20 MEQ PO TBCR Oral Take 20 mEq by mouth daily.    Marland Kitchen VITAMIN E 400 UNITS PO CAPS Oral Take 400 Units by mouth daily.    . DSS 100 MG PO CAPS Oral Take 100 mg by mouth 2 (two) times daily. 60 capsule 5  .  IBUPROFEN 200 MG PO TABS Oral Take 200 mg by mouth every 4 (four) hours as needed.    . INSULIN ISOPHANE HUMAN 100 UNIT/ML [place] SUSP Subcutaneous Inject 55 Units into the skin 2 (two) times daily before a meal. 55 units in AM and 45 units in PM 1 vial 11  . LORAZEPAM 0.5 MG PO TABS Oral Take 1 tablet (0.5 mg total) by mouth every 8 (eight) hours as needed for anxiety. 30 tablet 1  . TRAMADOL HCL 50 MG PO TABS Oral Take 1 tablet (50 mg total) by mouth every 6 (six) hours as needed. 60 tablet 5    BP 181/75  Pulse 95  Temp(Src) 97.9 F (36.6 C) (Oral)  Resp 24  SpO2 100%  Physical Exam  Nursing note and vitals reviewed. Constitutional: She is oriented to person, place, and time. She appears well-developed and well-nourished. She appears distressed.   HENT:  Head: Normocephalic and atraumatic.  Mouth/Throat: Oropharynx is clear and moist.  Eyes: EOM are normal. Pupils are equal, round, and reactive to light.  Neck: Normal range of motion. Neck supple.  Cardiovascular: Normal rate and regular rhythm.   Pulmonary/Chest: Effort normal and breath sounds normal. No respiratory distress. She has no wheezes. She has no rales.  Abdominal: Soft. Bowel sounds are normal. She exhibits no mass. There is tenderness (diffuse tenderness, esp LLQ. no rebound or guarding). There is no rebound and no guarding.  Musculoskeletal: Normal range of motion. She exhibits no edema and no tenderness.  Neurological: She is alert and oriented to person, place, and time.       Sensation intact, 5/5 motor  Skin: Skin is warm and dry. No rash noted. No erythema.  Psychiatric: She has a normal mood and affect. Her behavior is normal.    ED Course  Procedures (including critical care time)  Labs Reviewed  GLUCOSE, CAPILLARY - Abnormal; Notable for the following:    Glucose-Capillary 115 (*)    All other components within normal limits  DIFFERENTIAL - Abnormal; Notable for the following:    Neutrophils Relative 79 (*)    Neutro Abs 7.9 (*)    All other components within normal limits  COMPREHENSIVE METABOLIC PANEL - Abnormal; Notable for the following:    Sodium 132 (*)    Potassium 3.0 (*)    Chloride 95 (*)    Glucose, Bld 109 (*)    GFR calc non Af Amer 83 (*)    All other components within normal limits  URINALYSIS, ROUTINE W REFLEX MICROSCOPIC - Abnormal; Notable for the following:    pH 8.5 (*)    All other components within normal limits  CBC  LIPASE, BLOOD  LAB REPORT - SCANNED   No results found.   1. Sciatica   2. Abdominal pain   3. Hypokalemia       MDM          Loren Racer, MD 12/21/11 [phone]

## 2011-12-08 NOTE — ED Notes (Signed)
Brought in by EMS with c/o constipation with abdominal pain. Per EMS, pt had 3 enemas tonight but without good result.  Pt reports that she has had no good BM since Friday. Pt also reports LLQ abdominal pain. Denies nausea or vomiting.

## 2011-12-09 ENCOUNTER — Ambulatory Visit
Admission: RE | Admit: 2011-12-09 | Discharge: 2011-12-09 | Disposition: A | Discharge status: Home / Self Care | Payer: Medicare Other | Source: Ambulatory Visit | Attending: Internal Medicine | Admitting: Internal Medicine

## 2011-12-09 ENCOUNTER — Emergency Department (HOSPITAL_COMMUNITY): Payer: Medicare Other

## 2011-12-09 DIAGNOSIS — M545 Low back pain: Secondary | ICD-10-CM

## 2011-12-09 LAB — CBC
Hemoglobin: 12.9 g/dL (ref 12.0–15.0)
Platelets: 263 10*3/uL (ref 150–400)
RBC: 4.71 MIL/uL (ref 3.87–5.11)
WBC: 10 10*3/uL (ref 4.0–10.5)

## 2011-12-09 LAB — DIFFERENTIAL
Lymphocytes Relative: 13 % (ref 12–46)
Lymphs Abs: 1.3 10*3/uL (ref 0.7–4.0)
Monocytes Relative: 7 % (ref 3–12)
Neutrophils Relative %: 79 % — ABNORMAL HIGH (ref 43–77)

## 2011-12-09 LAB — URINALYSIS, ROUTINE W REFLEX MICROSCOPIC
Bilirubin Urine: NEGATIVE
Ketones, ur: NEGATIVE mg/dL
Nitrite: NEGATIVE
Protein, ur: NEGATIVE mg/dL
pH: 8.5 — ABNORMAL HIGH (ref 5.0–8.0)

## 2011-12-09 MED ORDER — OXYCODONE-ACETAMINOPHEN 5-325 MG PO TABS
2.0000 | ORAL_TABLET | Freq: Once | ORAL | Status: AC
  Administered 2011-12-09: 2 via ORAL
  Filled 2011-12-09: qty 2

## 2011-12-09 MED ORDER — IOHEXOL 300 MG/ML  SOLN
100.0000 mL | Freq: Once | INTRAMUSCULAR | Status: AC | PRN
  Administered 2011-12-09: 100 mL via INTRAVENOUS

## 2011-12-09 MED ORDER — POTASSIUM CHLORIDE 10 MEQ/100ML IV SOLN
10.0000 meq | Freq: Once | INTRAVENOUS | Status: AC
  Administered 2011-12-09: 10 meq via INTRAVENOUS
  Filled 2011-12-09: qty 100

## 2011-12-09 NOTE — Discharge Instructions (Signed)
Your CT scan shows slightly enlarged lymph node in your abdomen but no other acute findings - please share your results with your family doctor this week.    Have you family doctor recheck your potassium in one week.  You have been diagnosed with undifferentiated abdominal pain.  Abdominal pain can be caused by many things. Your caregiver evaluates the seriousness of your pain by an examination and possibly blood or urine tests and imaging (CT scan, x-rays, ultrasound). Many cases can be observed and treated at home after initial evaluation in the emergency department. Even though you are being discharged home, abdominal pain can be unpredictable. Therefore, you need a repeat exam if your pain does not resolve, returns, or worsens. Most patient's with abdominal pain do not need to be admitted to the hospital or have surgery, but serious problems like appendicitis and gallbladder attacks can start out as nonspecific pain. Many abdominal conditions cannot be diagnosed in 1 visit, so followup evaluations are very important.  Seek immediate medical attention if:  *The pain does not go away or becomes severe. *Temperature above 101 develops *Repeated vomiting occurs(multiple episodes) *The pain becomes localized to portions of the abdomen. The right side could possibly be appendicitis. In an adult, the left lower portion of the abdomen could be colitis or diverticulitis. *Blood is being passed in stools or vomit *Return also if you develop chest pain, difficulty breathing, dizziness or fainting, or become confused poorly responsive or inconsolable (young children).     If you do not have a physician, you should reference the below phone numbers and call in the morning to establish follow up care.  RESOURCE GUIDE  Dental Problems  Patients with Medicaid: [facility] [phone] [address]                                           [phone] [address] Phone:  [phone]                                                  Phone:  [phone]  If unable to pay or uninsured, contact:  [facility] or [facility] to become qualified for the adult dental clinic.  Chronic Pain Problems Contact [facility]  [phone] Patients need to be referred by their primary care doctor.  Insufficient Money for Medicine Contact [employer]:  call "211" or [facility] [phone].  No Primary Care Doctor Call [facility]  [phone] Other agencies that provide inexpensive medical care    [facility]  [phone]    [facility]  [phone]    [facility]  [phone]    [facility]  [phone]    [facility]  [phone]    [facility]  [phone]  Psychological Services [facility] Behavioral Health  [phone] [facility]  [phone] [facility] Mental Health   [phone] (emergency services [phone])  Substance Abuse Resources [facility]  7791095211 [facility] [phone] [facility] [phone] Floydene Flock [phone] Residential & Outpatient Substance Abuse Program  [phone]  Abuse/Neglect [facility] Child Abuse Hotline [phone]) 073-0475 [facility] Child Abuse Hotline 402-395-4898 (After Hours)  Emergency Shelter [facility] [phone]  Maternity Homes [facility] at [facility] [place] [facility] [phone] [place] Alert Services [phone]  MRSA Hotline #:   2700130926    [facility] Resources  [facility] [place]     [employer]                          [facility] Dept. [address] [place]                       [address]      [address]  Blondell Reveal Phone:  [phone]                                   Phone:  [phone]                  Phone:  [phone]  [facility] Mental Health Phone:  [phone]  [facility] Child Abuse Hotline 8028267518 [phone] (After Hours)

## 2011-12-09 NOTE — ED Provider Notes (Signed)
  Physical Exam  BP 181/75  Pulse 95  Temp(Src) 97.9 F (36.6 C) (Oral)  Resp 24  SpO2 100%  Physical Exam  ED Course  Procedures  MDM Patient accepted at change of shift, has had some sciatic pain and some abdominal pain. Her CT scan of the abdomen shows a lymph node that is enlarged but no other significant findings, the reconstituted findings on her spine showed multilevel degenerative change but no acute findings. Her pain has improved significantly after getting intravenous hydromorphone. She wants to followup at the outpatient radiology services for an MRI at 6:30 in the morning. This has arty been scheduled and she is in contact with neurosurgery who will followup her results today. She is neurovascularly intact at this time, has no weakness or numbness of the lower extremities and abdominal pain has resolved      Vida Roller, MD 12/09/11 [phone]

## 2011-12-12 ENCOUNTER — Encounter (HOSPITAL_COMMUNITY): Payer: Self-pay | Admitting: Emergency Medicine

## 2011-12-12 ENCOUNTER — Emergency Department (HOSPITAL_COMMUNITY): Payer: Medicare Other

## 2011-12-12 ENCOUNTER — Inpatient Hospital Stay (HOSPITAL_COMMUNITY)
Admission: EM | Admit: 2011-12-12 | Discharge: 2011-12-15 | DRG: 392 | Disposition: A | Discharge status: Home Health | Payer: Medicare Other | Attending: Internal Medicine | Admitting: Internal Medicine

## 2011-12-12 DIAGNOSIS — G4762 Sleep related leg cramps: Secondary | ICD-10-CM | POA: Diagnosis present

## 2011-12-12 DIAGNOSIS — R112 Nausea with vomiting, unspecified: Secondary | ICD-10-CM

## 2011-12-12 DIAGNOSIS — E871 Hypo-osmolality and hyponatremia: Secondary | ICD-10-CM | POA: Diagnosis present

## 2011-12-12 DIAGNOSIS — K5900 Constipation, unspecified: Principal | ICD-10-CM | POA: Diagnosis present

## 2011-12-12 DIAGNOSIS — E119 Type 2 diabetes mellitus without complications: Secondary | ICD-10-CM | POA: Diagnosis present

## 2011-12-12 DIAGNOSIS — E860 Dehydration: Secondary | ICD-10-CM

## 2011-12-12 DIAGNOSIS — M48061 Spinal stenosis, lumbar region without neurogenic claudication: Secondary | ICD-10-CM | POA: Diagnosis present

## 2011-12-12 DIAGNOSIS — M545 Low back pain: Secondary | ICD-10-CM | POA: Diagnosis present

## 2011-12-12 DIAGNOSIS — R252 Cramp and spasm: Secondary | ICD-10-CM | POA: Diagnosis present

## 2011-12-12 LAB — COMPREHENSIVE METABOLIC PANEL
AST: 18 U/L (ref 0–37)
Albumin: 3.7 g/dL (ref 3.5–5.2)
Alkaline Phosphatase: 99 U/L (ref 39–117)
Chloride: 91 mEq/L — ABNORMAL LOW (ref 96–112)
Potassium: 3.8 mEq/L (ref 3.5–5.1)
Sodium: 127 mEq/L — ABNORMAL LOW (ref 135–145)
Total Bilirubin: 0.5 mg/dL (ref 0.3–1.2)

## 2011-12-12 LAB — DIFFERENTIAL
Basophils Absolute: 0 10*3/uL (ref 0.0–0.1)
Lymphs Abs: 1.4 10*3/uL (ref 0.7–4.0)
Monocytes Relative: 7 % (ref 3–12)
Neutro Abs: 5.6 10*3/uL (ref 1.7–7.7)

## 2011-12-12 LAB — CBC
HCT: 36.9 % (ref 36.0–46.0)
MCHC: 35 g/dL (ref 30.0–36.0)
MCV: 78.3 fL (ref 78.0–100.0)
RDW: 13.4 % (ref 11.5–15.5)
WBC: 7.7 10*3/uL (ref 4.0–10.5)

## 2011-12-12 LAB — URINALYSIS, ROUTINE W REFLEX MICROSCOPIC
Bilirubin Urine: NEGATIVE
Glucose, UA: NEGATIVE mg/dL
Hgb urine dipstick: NEGATIVE
Specific Gravity, Urine: 1.014 (ref 1.005–1.030)
Urobilinogen, UA: 0.2 mg/dL (ref 0.0–1.0)

## 2011-12-12 LAB — GLUCOSE, CAPILLARY: Glucose-Capillary: 76 mg/dL (ref 70–99)

## 2011-12-12 LAB — OCCULT BLOOD, POC DEVICE: Fecal Occult Bld: NEGATIVE

## 2011-12-12 MED ORDER — ACETAMINOPHEN 650 MG RE SUPP: 650.0000 mg | Freq: Four times a day (QID) | RECTAL | Status: DC | PRN

## 2011-12-12 MED ORDER — SODIUM CHLORIDE 0.9 % IV BOLUS (SEPSIS)
1000.0000 mL | Freq: Once | INTRAVENOUS | Status: AC
  Administered 2011-12-12: 1000 mL via INTRAVENOUS

## 2011-12-12 MED ORDER — ACETAMINOPHEN 325 MG PO TABS: 650.0000 mg | ORAL_TABLET | Freq: Four times a day (QID) | ORAL | Status: DC | PRN

## 2011-12-12 MED ORDER — HYDROCODONE-ACETAMINOPHEN 5-325 MG PO TABS: 1.0000 | ORAL_TABLET | ORAL | Status: DC | PRN

## 2011-12-12 MED ORDER — INSULIN NPH (HUMAN) (ISOPHANE) 100 UNIT/ML ~~LOC~~ SUSP
10.0000 [IU] | Freq: Two times a day (BID) | SUBCUTANEOUS | Status: DC
  Administered 2011-12-14 – 2011-12-15 (×3): 10 [IU] via SUBCUTANEOUS
  Filled 2011-12-12: qty 10

## 2011-12-12 MED ORDER — ONDANSETRON HCL 4 MG/2ML IJ SOLN
4.0000 mg | Freq: Four times a day (QID) | INTRAMUSCULAR | Status: DC | PRN
  Administered 2011-12-13: 4 mg via INTRAVENOUS
  Filled 2011-12-12: qty 2

## 2011-12-12 MED ORDER — SENNA 8.6 MG PO TABS
1.0000 | ORAL_TABLET | Freq: Two times a day (BID) | ORAL | Status: DC
  Administered 2011-12-12 – 2011-12-14 (×5): 8.6 mg via ORAL
  Filled 2011-12-12 (×5): qty 1

## 2011-12-12 MED ORDER — MORPHINE SULFATE 4 MG/ML IJ SOLN
4.0000 mg | Freq: Once | INTRAMUSCULAR | Status: AC
  Administered 2011-12-12: 4 mg via INTRAVENOUS
  Filled 2011-12-12: qty 1

## 2011-12-12 MED ORDER — HYDROMORPHONE HCL PF 1 MG/ML IJ SOLN
0.5000 mg | INTRAMUSCULAR | Status: DC | PRN
  Administered 2011-12-12: 0.5 mg via INTRAVENOUS
  Filled 2011-12-12: qty 1

## 2011-12-12 MED ORDER — FLEET ENEMA 7-19 GM/118ML RE ENEM: 1.0000 | ENEMA | Freq: Once | RECTAL | Status: AC | PRN

## 2011-12-12 MED ORDER — SODIUM CHLORIDE 0.9 % IV SOLN
INTRAVENOUS | Status: AC
  Administered 2011-12-12: 21:00:00 via INTRAVENOUS

## 2011-12-12 MED ORDER — ONDANSETRON HCL 4 MG/2ML IJ SOLN
4.0000 mg | Freq: Three times a day (TID) | INTRAMUSCULAR | Status: DC | PRN
  Filled 2011-12-12: qty 2

## 2011-12-12 MED ORDER — ASPIRIN EC 81 MG PO TBEC
81.0000 mg | DELAYED_RELEASE_TABLET | Freq: Every day | ORAL | Status: DC
  Administered 2011-12-13 – 2011-12-14 (×2): 81 mg via ORAL
  Filled 2011-12-12 (×3): qty 1

## 2011-12-12 MED ORDER — DOCUSATE SODIUM 100 MG PO CAPS
100.0000 mg | ORAL_CAPSULE | Freq: Two times a day (BID) | ORAL | Status: DC
  Administered 2011-12-12 – 2011-12-14 (×5): 100 mg via ORAL
  Filled 2011-12-12 (×7): qty 1

## 2011-12-12 MED ORDER — MORPHINE SULFATE 2 MG/ML IJ SOLN
2.0000 mg | INTRAMUSCULAR | Status: DC | PRN
  Administered 2011-12-12 – 2011-12-13 (×2): 2 mg via INTRAVENOUS
  Filled 2011-12-12 (×2): qty 1

## 2011-12-12 MED ORDER — POLYETHYLENE GLYCOL 3350 17 G PO PACK
17.0000 g | PACK | Freq: Two times a day (BID) | ORAL | Status: DC
  Administered 2011-12-12 – 2011-12-14 (×5): 17 g via ORAL
  Filled 2011-12-12 (×7): qty 1

## 2011-12-12 MED ORDER — SODIUM CHLORIDE 0.9 % IV SOLN
INTRAVENOUS | Status: AC
  Administered 2011-12-12: via INTRAVENOUS

## 2011-12-12 MED ORDER — ALUM & MAG HYDROXIDE-SIMETH 200-200-20 MG/5ML PO SUSP: 30.0000 mL | Freq: Four times a day (QID) | ORAL | Status: DC | PRN

## 2011-12-12 MED ORDER — INSULIN ASPART 100 UNIT/ML ~~LOC~~ SOLN
0.0000 [IU] | Freq: Three times a day (TID) | SUBCUTANEOUS | Status: DC
  Administered 2011-12-13: 5 [IU] via SUBCUTANEOUS
  Administered 2011-12-14: 2 [IU] via SUBCUTANEOUS
  Administered 2011-12-14 (×2): 3 [IU] via SUBCUTANEOUS
  Administered 2011-12-15: 5 [IU] via SUBCUTANEOUS

## 2011-12-12 MED ORDER — POTASSIUM CHLORIDE CRYS ER 20 MEQ PO TBCR
20.0000 meq | EXTENDED_RELEASE_TABLET | Freq: Every day | ORAL | Status: DC
  Administered 2011-12-13 – 2011-12-14 (×2): 20 meq via ORAL
  Filled 2011-12-12 (×3): qty 1

## 2011-12-12 MED ORDER — ONDANSETRON HCL 4 MG PO TABS
4.0000 mg | ORAL_TABLET | Freq: Four times a day (QID) | ORAL | Status: DC | PRN
  Administered 2011-12-15: 4 mg via ORAL
  Filled 2011-12-12: qty 1

## 2011-12-12 MED ORDER — ONDANSETRON 4 MG PO TBDP
4.0000 mg | ORAL_TABLET | Freq: Once | ORAL | Status: AC
  Administered 2011-12-12: 4 mg via ORAL
  Filled 2011-12-12 (×2): qty 1

## 2011-12-12 NOTE — ED Notes (Signed)
 output on I&O.

## 2011-12-12 NOTE — ED Notes (Addendum)
Tuesday was the last bowel movement. Bowel colors were brown. She took 3 enemas on Tuesday. She had been on Miralax and she took the Miralax today.  She is on pain meds and took two today at 1120 today. She takes Hydrocone Acetaminophen q4hours. She had surgery on the first of march for a hernia and vaginal and rectal construction. She's having trouble with her sciatic nerve, arthritis in the back, and degenerative disk disease. She was in [place] Tuesday. She received a CT with no impressions noted. They did discover a low potassium level. She takes potassium everyday, but she was still low and they told her to come here and get it checked again. Chronic urinary frequency.

## 2011-12-12 NOTE — ED Provider Notes (Signed)
History     CSN: [id number]  Arrival date & time 12/12/11  1557   First MD Initiated Contact with Patient 12/12/11 1705      Chief Complaint  Patient presents with  . Abdominal Pain  . Nausea    (Consider location/radiation/quality/duration/timing/severity/associated sxs/prior treatment) HPI Complains of low bowel pain radiating to low back and posterior thighs onset 2 weeks ago. Treated with Vicodin, without relief. Pain is constant worse with movement last bowel movement 4 days ago. Admits to nausea and dry heaves today vomited one time today also had dry heaves yesterday pain is also worse with needing not improved by anything. Patient was evaluated here 12/08/2011 for same complaint had CT scans of lumbosacral spine and of the abdomen and pelvis showing degenerative disc disease otherwise no acute disease denies fever denies other associated symptoms Past Medical History  Diagnosis Date  . Diabetes mellitus     greater than 30 years  . Hypertension   . Incontinence of urine   . Diarrhea   . Neuropathy     bilateral feet  . Arthritis     lower back/hands  . GERD (gastroesophageal reflux disease)     Past Surgical History  Procedure Date  . Eye surgery     bil.[email or web address]-unknown year  . Incontinence surgery 2005  . Tonsillectomy 1964  . Abdominal hysterectomy     1982  . Ovarian cyst surgery 1972  . Breast biopsy 1990  . Cholecystectomy 1988  . Cystoscopy 2009    macroplastique inj  . Rectocele repair 10/15/2011    Procedure: POSTERIOR REPAIR (RECTOCELE);  Surgeon: Kathi Ludwig, MD;  Location: [facility];  Service: Urology;;  posterior vaginal vault repair with sacral spinus repair with graft  . Cystoscopy 10/15/2011    Procedure: CYSTOSCOPY;  Surgeon: Kathi Ludwig, MD;  Location: [facility];  Service: Urology;  Laterality: N/A;    No family history on file.  History  Substance Use Topics  . Smoking status: Never Smoker    . Smokeless tobacco: Not on file  . Alcohol Use: No    OB History    Grav Para Term Preterm Abortions TAB SAB Ect Mult Living                  Review of Systems  Unable to perform ROS All other systems reviewed and are negative.    Allergies  Neosporin and Sulfa antibiotics  Home Medications   Current Outpatient Rx  Name Route Sig Dispense Refill  . AMILORIDE-HYDROCHLOROTHIAZIDE 5-50 MG PO TABS Oral Take 1 tablet by mouth daily.    . ASPIRIN EC 81 MG PO TBEC Oral Take 81 mg by mouth daily.    Marland Kitchen VITAMIN D 1000 UNITS PO TABS Oral Take 1,000 Units by mouth daily.    Marland Kitchen HYDROCODONE-ACETAMINOPHEN 5-500 MG PO TABS Oral Take 1-2 tablets by mouth every 4 (four) hours as needed.    . IBUPROFEN 200 MG PO TABS Oral Take 200 mg by mouth every 4 (four) hours as needed.    . INSULIN LISPRO (HUMAN) 100 UNIT/ML [place] SOLN Subcutaneous Inject 3-5 Units into the skin 3 (three) times daily as needed. For CBG > 150; sliding scale.    . INSULIN ISOPHANE HUMAN 100 UNIT/ML [place] SUSP Subcutaneous Inject 65 Units into the skin 2 (two) times daily before a meal. Adjusts to keep CBG-range 80-100    . POLYETHYLENE GLYCOL 3350 PO PACK Oral Take 17 g by mouth daily  as needed. For constipation.    Marland Kitchen POTASSIUM CHLORIDE CRYS ER 20 MEQ PO TBCR Oral Take 20 mEq by mouth daily.    Marland Kitchen VITAMIN E 400 UNITS PO CAPS Oral Take 400 Units by mouth daily.      BP 174/70  Pulse 80  Temp(Src) 98.4 F (36.9 C) (Oral)  Resp 25  SpO2 94%  Physical Exam  Nursing note and vitals reviewed. Constitutional: She appears well-developed and well-nourished. She appears distressed.       Appears mildly uncomfortable  HENT:  Head: Normocephalic and atraumatic.       Mucous membranes dry  Eyes: Conjunctivae are normal. Pupils are equal, round, and reactive to light.  Neck: Neck supple. No tracheal deviation present. No thyromegaly present.  Cardiovascular: Normal rate and regular rhythm.   No murmur heard. Pulmonary/Chest:  Effort normal and breath sounds normal.  Abdominal: Soft. Bowel sounds are normal. She exhibits no distension. There is tenderness.       Tender infraumbilical area  Musculoskeletal: Normal range of motion. She exhibits no edema and no tenderness.       Entire spine nontender  Neurological: She is alert. She has normal reflexes. No cranial nerve deficit. Coordination normal.  Skin: Skin is warm and dry. No rash noted.  Psychiatric: She has a normal mood and affect.    ED Course  Procedures (including critical care time) Feels somewhat improved after treatment with intravenous narcotics, antiemetics and fluids.  Labs Reviewed  OCCULT BLOOD, POC DEVICE  OCCULT BLOOD X 1 CARD TO LAB, STOOL   No results found.   No diagnosis found.  Results for orders placed during the hospital encounter of 12/12/11  OCCULT BLOOD, POC DEVICE      Component Value Range   Fecal Occult Bld NEGATIVE    COMPREHENSIVE METABOLIC PANEL      Component Value Range   Sodium 127 (*) 135 - 145 (mEq/L)   Potassium 3.8  3.5 - 5.1 (mEq/L)   Chloride 91 (*) 96 - 112 (mEq/L)   CO2 29  19 - 32 (mEq/L)   Glucose, Bld 78  70 - 99 (mg/dL)   BUN 16  6 - 23 (mg/dL)   Creatinine, Ser 1.61  0.50 - 1.10 (mg/dL)   Calcium 8.6  8.4 - 09.6 (mg/dL)   Total Protein 6.5  6.0 - 8.3 (g/dL)   Albumin 3.7  3.5 - 5.2 (g/dL)   AST 18  0 - 37 (U/L)   ALT 15  0 - 35 (U/L)   Alkaline Phosphatase 99  39 - 117 (U/L)   Total Bilirubin 0.5  0.3 - 1.2 (mg/dL)   GFR calc non Af Amer 84 (*) >90 (mL/min)   GFR calc Af Amer >90  >90 (mL/min)  LIPASE, BLOOD      Component Value Range   Lipase 14  11 - 59 (U/L)  CBC      Component Value Range   WBC 7.7  4.0 - 10.5 (K/uL)   RBC 4.71  3.87 - 5.11 (MIL/uL)   Hemoglobin 12.9  12.0 - 15.0 (g/dL)   HCT 04.5  40.9 - 81.1 (%)   MCV 78.3  78.0 - 100.0 (fL)   MCH 27.4  26.0 - 34.0 (pg)   MCHC 35.0  30.0 - 36.0 (g/dL)   RDW 91.4  78.2 - 95.6 (%)   Platelets 194  150 - 400 (K/uL)    DIFFERENTIAL      Component Value Range   Neutrophils  Relative 73  43 - 77 (%)   Lymphocytes Relative 18  12 - 46 (%)   Monocytes Relative 7  3 - 12 (%)   Eosinophils Relative 2  0 - 5 (%)   Basophils Relative 0  0 - 1 (%)   Neutro Abs 5.6  1.7 - 7.7 (K/uL)   Lymphs Abs 1.4  0.7 - 4.0 (K/uL)   Monocytes Absolute 0.5  0.1 - 1.0 (K/uL)   Eosinophils Absolute 0.2  0.0 - 0.7 (K/uL)   Basophils Absolute 0.0  0.0 - 0.1 (K/uL)   Smear Review MORPHOLOGY UNREMARKABLE    URINALYSIS, ROUTINE W REFLEX MICROSCOPIC      Component Value Range   Color, Urine YELLOW  YELLOW    APPearance CLEAR  CLEAR    Specific Gravity, Urine 1.014  1.005 - 1.030    pH 7.5  5.0 - 8.0    Glucose, UA NEGATIVE  NEGATIVE (mg/dL)   Hgb urine dipstick NEGATIVE  NEGATIVE    Bilirubin Urine NEGATIVE  NEGATIVE    Ketones, ur NEGATIVE  NEGATIVE (mg/dL)   Protein, ur NEGATIVE  NEGATIVE (mg/dL)   Urobilinogen, UA 0.2  0.0 - 1.0 (mg/dL)   Nitrite NEGATIVE  NEGATIVE    Leukocytes, UA NEGATIVE  NEGATIVE    Dg Lumbar Spine Complete  12/04/2011  *RADIOLOGY REPORT*  Clinical Data: Low back pain and bilateral leg pain.  LUMBAR SPINE - COMPLETE 4+ VIEW  Comparison: No priors.  Comparison is made with prior sagittal reconstructions from CT of the abdomen and pelvis 03/11/2011.  Findings: There is a compression fracture of the superior endplate of T12 with approximately 15% loss of anterior vertebral body height (this is unchanged).  No additional acute displaced fractures or compression type fractures are identified.  There is significant multilevel degenerative disc disease, most pronounced at the L3-L4 where there are advanced degenerative changes of the adjacent vertebral body endplates.  Multilevel facet arthropathy is also seen throughout the lumbar spine, most severe at L4-L5 and L5- S1.  Alignment is anatomic on the lateral projection, and there is a very mild levoscoliosis of the lumbar spine centered at L3 on the frontal  projection.  Atherosclerosis of the visualized abdominal vasculature.  Surgical clips projecting over the right upper quadrant of the abdomen, likely from prior cholecystectomy.  IMPRESSION: 1.  Multilevel degenerative disc disease and lumbar spondylosis without acute radiographic abnormality of the lumbar spine to account for the patient's symptoms, as above. 2.  Old compression fracture of T12 is unchanged, as above. 3.  Atherosclerosis. 4.  Status post cholecystectomy.  Original Report Authenticated By: Florencia Reasons, M.D.   Ct Lumbar Spine Wo Contrast  12/09/2011  *RADIOLOGY REPORT*  Clinical Data: Left lower quadrant pain.  CT LUMBAR SPINE WITHOUT CONTRAST  Technique:  Multidetector CT imaging of the lumbar spine was performed without intravenous contrast administration. Multiplanar CT image reconstructions were also generated.  Comparison: 12/09/2011 abdomen pelvis CT.  Findings: Leftward curvature and multilevel degenerative changes, most pronounced at L3-4 and L5 S1.  There is a broad-based L2-3 disc bulge results in mild central canal narrowing.  L3-4 paracentral disc bulge results in moderate right paracentral narrowing and moderate neural foraminal narrowing.  Vacuum disc phenomenon at L4-5. Disc bulge results in severe central canal narrowing.  There is mild superior endplate deformity at T12 with less than 25% vertebral body height loss. No retropulsion.  See separate abdomen pelvis CT report for intra-abdominal findings.  IMPRESSION: Multilevel degenerative changes  and disc bulges as described above.  Mild superior endplate deformity of T12 is unchanged from May, with less than 25% vertebral body height loss.  Original Report Authenticated By: Waneta Martins, M.D.   Mr Lumbar Spine Wo Contrast  12/09/2011  *RADIOLOGY REPORT*  Clinical Data: Back pain.  Posterior thigh pain.  Weakness in the legs.  Urinary incontinence.  Recent hernia surgery.  MRI LUMBAR SPINE WITHOUT CONTRAST  Technique:   Multiplanar and multiecho pulse sequences of the lumbar spine were obtained without intravenous contrast.  Comparison: Abdomen CT dated 12/09/2011  Findings: The lowest full intervertebral disk space is labeled L5- S1.  If procedural intervention is to be performed, careful correlation with this numbering strategy is recommended.  The conus medullaris appears unremarkable.  Conus level:  L1-2.  Levoconvex lumbar scoliosis noted with rotary component.  We partially include a left renal cyst.  There is a mild compression fracture of the anterior superior plate of L5.  Old compression fracture of T12 noted.  There is 5 mm of degenerative anterior subluxation of L4 on L5, without pars defects observed.  Additional findings at individual levels are as follows:  L1-2:  Mild bulge, no impingement.  L2-3:  Diffuse disc bulge is mildly eccentric to the left and causes mild right subarticular lateral recess stenosis.  L3-4:  Diffuse disc bulge noted with right greater than left facet arthropathy and ligament flavum redundancy.  There is mild right foraminal stenosis along with moderate right subarticular lateral recess stenosis. The AP diameter of the thecal sac is 8 mm, compatible with moderate central stenosis.  L4-5:  Facet arthropathy and ligament flavum redundancy noted with right facet effusion, diffuse disc bulge, central disc protrusion, and a right foraminal and lateral extraforaminal disc protrusion. The combination of findings causes severe central stenosis with the cross-sectional area of the thecal sac narrowed to 0.2 cm^2.  There is marked left and moderate to marked right subarticular lateral recess stenosis along with moderate to prominent bilateral foraminal stenosis.  L5-S1:  Disc bulge noted with left foraminal disc protrusion and left greater than right facet arthropathy.  This results in moderate to prominent left foraminal stenosis.  IMPRESSION:  1.  Subacute anterior superior endplate compression  fracture L5.  2.  Spondylosis, degenerative disc disease, and levoconvex scoliosis cause multilevel impingement in the lumbar spine, with dominant (and severe) stenosis at the L4-5 level as described above.  There is also moderate to prominent left foraminal stenosis at L5-S1, moderate impingement at L3-4, and mild impingement at L2- 3.  Original Report Authenticated By: Dellia Cloud, M.D.   Ct Abdomen Pelvis W Contrast  12/09/2011  *RADIOLOGY REPORT*  Clinical Data: Abdominal pain  CT ABDOMEN AND PELVIS WITH CONTRAST  Technique:  Multidetector CT imaging of the abdomen and pelvis was performed following the standard protocol during bolus administration of intravenous contrast.  Contrast: OMNIPAQUE IOHEXOL 300 MG/ML  SOLN  Comparison: 03/11/2011, 02/15/2007  Findings: Coronary artery calcification.  Aortic valve calcification.  No pleural or pericardial effusion.  Mild intra and extrahepatic biliary ductal prominence with smooth tapering to the level of the ampulla.  No obstructing lesion is identified.  Status post cholecystectomy.  No focal liver abnormality.  Unremarkable spleen, pancreas, adrenal glands.  Lobular renal contours of multiple hypodensities, too small further characterize.  No hydronephrosis or hydroureter.  Colonic diverticulosis without CT evidence for diverticulitis.  No bowel obstruction.  Normal appendix.  No free intraperitoneal air or fluid.  1 cm  short axis lymph node along the celiac chain (series 2 image 26) unchanged from 2008.  Otherwise, no lymphadenopathy.  There is scattered atherosclerotic calcification of the aorta and its branches. No aneurysmal dilatation.  Thin-walled bladder.  Pelvic floor laxity.  Hyperdense appearance to the periurethral region, may reflect a bulking agent.  Unchanged from 2009.  Multilevel degenerative changes. Superior endplate deformity at T12 is unchanged.  No acute osseous abnormality.  IMPRESSION: Mild colonic diverticulosis.  No CT  evidence for diverticulitis or colitis.  Original Report Authenticated By: Waneta Martins, M.D.   Dg Abd Acute W/chest  12/12/2011  *RADIOLOGY REPORT*  Clinical Data: 78 year old female with lower abdominal pain nausea and vomiting.  ACUTE ABDOMEN SERIES (ABDOMEN 2 VIEW & CHEST 1 VIEW)  Comparison: CT abdomen pelvis 12/09/2011 and and earlier.  Findings: Mild elevation of the right hemidiaphragm.  Lung volumes are within normal limits.  Cardiac size and mediastinal contours are within normal limits.  Visualized tracheal air column is within normal limits.  No pneumothorax or pneumoperitoneum.  Increased streaky opacity along the left heart border.  This is not correlated on the lung bases of the recent comparison.  Right upper quadrant surgical clips. Nonobstructed bowel gas pattern.  Stable scoliosis.  Vascular calcifications.  IMPRESSION: 1. Nonobstructed bowel gas pattern, no free air. 2.  Patchy retrocardiac opacity is new since 12/09/2011.  Query atelectasis or developing infection.  Lateral view of the chest may be valuable.  Original Report Authenticated By: Harley Hallmark, M.D.     MDM  Patient clinically dehydrated Spoke with Dr. Irene Limbo Plan 23 hour observation Pain and nausea control IV hydration Diagnosis #1 nonspecific abdominal pain #2 dehydration #3 chronic back pain #4 hyponatremia       Doug Sou, MD 12/12/11 2033

## 2011-12-12 NOTE — ED Notes (Signed)
Pt presenting to ed with c/o sent by her pcp. Pt states she was seen here Tuesday for the same symptoms. Pt states she's having abdominal pain, nausea, vomiting and constipation. Pt states Tuesday her potassium was low and her doctor wants her to get it rechecked today. Pt states she presented to urgent care but was informed that they could not check her potassium.

## 2011-12-12 NOTE — H&P (Signed)
History and Physical  [name] ZOX:[id number] DOB: 04/19/1933 DOA: 12/12/2011  Referring physician: Doug Sou, M.D. PCP: Lillia Mountain, MD, MD  Neurosurgeon: Shirlean Kelly, M.D.  Chief Complaint: constipation  HPI:  78 year old woman presents to the emergency department with complaints of nausea, vomiting, constipation, abdominal pain and back pain. Patient has had continuing back pain for the last several weeks has been seen twice in the emergency department for the same. MRI revealed a subacute compression fracture and severe stenosis at L4-5. She has been taking Vicodin for pain relief. She was taking MiraLAX as needed. She became constipated and his not had a bowel movement in approximately 5 days. She has tried enemas without relief as well as twice daily MiraLAX. She has nausea and vomiting with food intake. She reports hypoglycemia. Her back pain has not changed. She continues to have pain across the low back with radiation down to the left thigh. She has followup scheduled already with Dr. Newell Coral next week.  In the emergency department she was noted be afebrile and vital signs were stable. Capillary blood sugars were unremarkable. Chemistry panel notable for sodium of 127 and a chloride of 91. CBC was unremarkable. Acute abdominal series was unremarkable with the exception of possible atelectasis. Because of her nausea, vomiting and hyponatremia she was referred for admission.  Chart Review:  12/09/2011 MRI lumbar spine: Subacute endplate compression fracture L5. Spondylosis, degenerative disc disease, severe stenosis L4-5.  12/09/2011 ED visit: Back pain, sciatica, abdominal pain  12/04/2011 emergency department visit: Sciatica, lower back pain.  Review of Systems:  Negative for fever, changes to her vision, sore throat, rash, chest pain, shortness of breath, bleeding. No sacral dysesthesia. No weakness.  Positive for low back pain, chronic urinary  incontinence.  Past Medical History  Diagnosis Date  . Diabetes mellitus     greater than 30 years  . Hypertension   . Incontinence of urine   . Diarrhea   . Neuropathy     bilateral feet  . Arthritis     lower back/hands  . GERD (gastroesophageal reflux disease)    Past Surgical History  Procedure Date  . Eye surgery     bil.cataract-unknown year  . Incontinence surgery 2005  . Tonsillectomy 1964  . Abdominal hysterectomy     1982  . Ovarian cyst surgery 1972  . Breast biopsy 1990  . Cholecystectomy 1988  . Cystoscopy 2009    macroplastique inj  . Rectocele repair 10/15/2011    Procedure: POSTERIOR REPAIR (RECTOCELE);  Surgeon: Kathi Ludwig, MD;  Location: [facility];  Service: Urology;;  posterior vaginal vault repair with sacral spinus repair with graft  . Cystoscopy 10/15/2011    Procedure: CYSTOSCOPY;  Surgeon: Kathi Ludwig, MD;  Location: [facility];  Service: Urology;  Laterality: N/A;   Social History:  reports that she has never smoked. She does not have any smokeless tobacco history on file. She reports that she does not drink alcohol or use illicit drugs.  Allergies  Allergen Reactions  . Neosporin (Neomycin-Bacitracin Zn-Polymyx) Rash  . Sulfa Antibiotics Rash   Family History  Problem Relation Age of Onset  . Osteoarthritis Sister   . Osteoarthritis Brother    Prior to Admission medications   Medication Sig Start Date End Date Taking? Authorizing Provider  amiloride-hydrochlorothiazide (MODURETIC) 5-50 MG tablet Take 1 tablet by mouth daily.   Yes Historical Provider, MD  aspirin EC 81 MG tablet Take 81 mg by mouth daily.  Yes Historical Provider, MD  cholecalciferol (VITAMIN D) 1000 UNITS tablet Take 1,000 Units by mouth daily.   Yes Historical Provider, MD  HYDROcodone-acetaminophen (VICODIN) 5-500 MG per tablet Take 1-2 tablets by mouth every 4 (four) hours as needed. 12/04/11 12/14/11 Yes Bethany J Hunt, PA   ibuprofen (ADVIL,MOTRIN) 200 MG tablet Take 200 mg by mouth every 4 (four) hours as needed.   Yes Historical Provider, MD  insulin lispro (HUMALOG) 100 UNIT/ML injection Inject 3-5 Units into the skin 3 (three) times daily as needed. For CBG > 150; sliding scale.   Yes Historical Provider, MD  insulin NPH (HUMULIN N,NOVOLIN N) 100 UNIT/ML injection Inject 65 Units into the skin 2 (two) times daily before a meal. Adjusts to keep CBG-range 80-100   Yes Historical Provider, MD  polyethylene glycol (MIRALAX / GLYCOLAX) packet Take 17 g by mouth daily as needed. For constipation.   Yes Historical Provider, MD  potassium chloride SA (K-DUR,KLOR-CON) 20 MEQ tablet Take 20 mEq by mouth daily.   Yes Historical Provider, MD  vitamin E 400 UNIT capsule Take 400 Units by mouth daily.   Yes Historical Provider, MD   Physical Exam: Filed Vitals:   12/12/11 1611 12/12/11 1830 12/12/11 1954  BP: 174/70 166/66 162/75  Pulse: 80 78 86  Temp: 98.4 F (36.9 C)    TempSrc: Oral    Resp: 25    SpO2: 94% 97% 94%    General:  Appears calm and comfortable. Exam and in the emergency department the  Eyes: Pupils round and equal. Lids and irises appear unremarkable.  ENT: Grossly normal hearing. Lips and tongue appear unremarkable.  Neck: No lymphadenopathy or masses. No thyromegaly.  Cardiovascular: Regular rate and rhythm. No murmur, rub, gallop. No lower extremity edema.  Respiratory: Clear to auscultation bilaterally. No wheezes, rales, rhonchi. Normal respiratory effort.  Abdomen: Soft, nontender, nondistended. Decreased bowel sounds.  Skin: Grossly unremarkable.  Musculoskeletal: Grossly normal tone and strength of bilateral lower extremities.  Psychiatric: Grossly normal mood and affect. Speech fluent and appropriate.  Labs on Admission:  Basic Metabolic Panel:  Lab 12/12/11 1610 12/08/11 2318  NA 127* 132*  K 3.8 3.0*  CL 91* 95*  CO2 29 25  GLUCOSE 78 109*  BUN 16 15  CREATININE 0.65  0.67  CALCIUM 8.6 8.9  MG -- --  PHOS -- --   Liver Function Tests:  Lab 12/12/11 1850 12/08/11 2318  AST 18 18  ALT 15 19  ALKPHOS 99 99  BILITOT 0.5 0.5  PROT 6.5 6.6  ALBUMIN 3.7 3.8    Lab 12/12/11 1850 12/08/11 2318  LIPASE 14 23  AMYLASE -- --   CBC:  Lab 12/12/11 1850 12/08/11 2318  WBC 7.7 10.0  NEUTROABS 5.6 7.9*  HGB 12.9 12.9  HCT 36.9 37.5  MCV 78.3 79.6  PLT 194 263   CBG:  Lab 12/08/11 2208  GLUCAP 115*   Radiological Exams on Admission: Dg Chest 1 View  12/12/2011  *RADIOLOGY REPORT*  Clinical Data: 78 year old female with abdominal pain nausea and vomiting.  Left lung density on earlier AP chest.  CHEST - 1 VIEW  Comparison: 1909 hours the same day and earlier.  Findings: The lateral views of the chest are stable compared to 09/12/2007.  No pleural effusion or focal airspace disease.  Stable endplate spurring in the thoracic spine.  Stable cardiac size and mediastinal contours.  IMPRESSION: No finding to correlate to the left lung density seen earlier, likely it was due  to atelectasis.  Original Report Authenticated By: Harley Hallmark, M.D.   Dg Abd Acute W/chest  12/12/2011  *RADIOLOGY REPORT*  Clinical Data: 78 year old female with lower abdominal pain nausea and vomiting.  ACUTE ABDOMEN SERIES (ABDOMEN 2 VIEW & CHEST 1 VIEW)  Comparison: CT abdomen pelvis 12/09/2011 and and earlier.  Findings: Mild elevation of the right hemidiaphragm.  Lung volumes are within normal limits.  Cardiac size and mediastinal contours are within normal limits.  Visualized tracheal air column is within normal limits.  No pneumothorax or pneumoperitoneum.  Increased streaky opacity along the left heart border.  This is not correlated on the lung bases of the recent comparison.  Right upper quadrant surgical clips. Nonobstructed bowel gas pattern.  Stable scoliosis.  Vascular calcifications.  IMPRESSION: 1. Nonobstructed bowel gas pattern, no free air. 2.  Patchy retrocardiac opacity  is new since 12/09/2011.  Query atelectasis or developing infection.  Lateral view of the chest may be valuable.  Original Report Authenticated By: Harley Hallmark, M.D.   Assessment/Plan 1. Nausea/vomiting/constipation: Suspect secondary to narcotic side effects. Aggressive bowel regimen. Hopefully will improve with stool evacuation. Antiemetics as needed. Abdominal exam benign and imaging unremarkable. No signs or symptoms to suggest acute intra-abdominal process. 2. Constipation: Aggressive bowel regimen. Secondary to narcotics. 3. Hyponatremia/hypochloremia: Secondary to dehydration, nausea, vomiting. May be complicated by hydrochlorothiazide. Repeat basic metabolic panel the morning. 4. Dehydration: IV fluids. 5. Diabetes mellitus: Reported episodes of hypoglycemia likely secondary to continued insulin use with poor oral intake. Decrease long-acting insulin and follow. Monitor closely. 6. Chronic urinary incontinence 7. Subacute lumbar compression fracture/low back pain: No signs or symptoms to suggest cauda equina syndrome or acute complication. Continue pain control. Followup with neurosurgery next week.  Message left notifying primary care physician of admission. Admit to Dr. Amedeo Kinsman service.  Code Status: Full code, confirmed. Family Communication: Discussed with daughter and daughter's husband at bedside. Disposition Plan: Pending further evaluation and treatment.  Brendia Sacks, MD  [facility] Pager [phone] 12/12/2011, 8:03 PM

## 2011-12-13 DIAGNOSIS — G4762 Sleep related leg cramps: Secondary | ICD-10-CM | POA: Diagnosis present

## 2011-12-13 DIAGNOSIS — K5900 Constipation, unspecified: Secondary | ICD-10-CM | POA: Diagnosis present

## 2011-12-13 LAB — BASIC METABOLIC PANEL
CO2: 28 mEq/L (ref 19–32)
Chloride: 100 mEq/L (ref 96–112)
Creatinine, Ser: 0.59 mg/dL (ref 0.50–1.10)
GFR calc Af Amer: >90 mL/min (ref >90)
Potassium: 3.5 mEq/L (ref 3.5–5.1)

## 2011-12-13 LAB — GLUCOSE, CAPILLARY
Glucose-Capillary: 82 mg/dL (ref 70–99)
Glucose-Capillary: 83 mg/dL (ref 70–99)

## 2011-12-13 MED ORDER — MAGNESIUM CITRATE PO SOLN
1.0000 | Freq: Once | ORAL | Status: AC
  Administered 2011-12-13: 1 via ORAL

## 2011-12-13 MED ORDER — MAGNESIUM OXIDE 400 (241.3 MG) MG PO TABS
400.0000 mg | ORAL_TABLET | Freq: Every day | ORAL | Status: DC
  Administered 2011-12-13 – 2011-12-14 (×2): 400 mg via ORAL
  Filled 2011-12-13 (×3): qty 1

## 2011-12-13 MED ORDER — BISACODYL 10 MG RE SUPP: 10.0000 mg | Freq: Every day | RECTAL | Status: DC | PRN

## 2011-12-13 MED ORDER — TRAMADOL HCL 50 MG PO TABS
100.0000 mg | ORAL_TABLET | Freq: Four times a day (QID) | ORAL | Status: DC | PRN
  Administered 2011-12-13 – 2011-12-15 (×4): 100 mg via ORAL
  Filled 2011-12-13: qty 2
  Filled 2011-12-13 (×2): qty 1
  Filled 2011-12-13 (×2): qty 2

## 2011-12-13 MED ORDER — KETOROLAC TROMETHAMINE 30 MG/ML IJ SOLN
30.0000 mg | Freq: Four times a day (QID) | INTRAMUSCULAR | Status: DC | PRN
Start: 2011-12-13 — End: 2011-12-15
  Administered 2011-12-13 – 2011-12-14 (×3): 30 mg via INTRAVENOUS
  Filled 2011-12-13 (×3): qty 1

## 2011-12-13 NOTE — Progress Notes (Addendum)
Subjective: 78 year old female with presentation to the emergency room yesterday with nausea vomiting and constipation. Recently diagnosed by MRI with L5 compression fracture, subacute, as well as severe lumbar stenosis at L4-5. She was supposed to see Dr. Shirlean Kelly this coming Tuesday for further evaluation for lumbar radiculopathy. It has become very hard for her to walk secondary to pain and weakness in her lower extremities. She has been taking Vicodin for pain and has developed obstipation and nausea and vomiting.  Objective: Weight change:   Intake/Output Summary (Last 24 hours) at 12/13/11 0941 Last data filed at 12/13/11 0845  Gross per 24 hour  Intake 853.75 ml  Output      0 ml  Net 853.75 ml   Filed Vitals:   12/12/11 2257 12/12/11 2354 12/13/11 0206 12/13/11 0524  BP: 158/71  152/75 118/63  Pulse: 79  92 81  Temp: 98.3 F (36.8 C)  98.3 F (36.8 C) 98.3 F (36.8 C)  TempSrc: Oral  Oral Oral  Resp: 20  18 18   Height:  5\' 4"  (1.626 m)    Weight:  79.924 kg (176 lb 3.2 oz)    SpO2: 100%  97% 95%   General: Appears calm but complaining of lower extremity pain in her posterior thighs as well as severe muscle cramping last night which has improved this morning.   HEENT: Benign. Oropharynx moist and tongue is not chronic and  Neck: No lymphadenopathy or masses. No thyromegaly.  Cardiovascular: Regular rate and rhythm. No murmur, rub, gallop. No lower extremity edema.  Respiratory: Clear to auscultation bilaterally. No wheezes, rales, rhonchi. Normal respiratory effort.  Abdomen: Soft, nontender, nondistended. Bowel sounds are normal, not increased Skin: Grossly unremarkable.  Musculoskeletal: She flexes her thighs slowly secondary to pain in the posterior thighs, right slightly greater than left. No muscle atrophy. Able to flex and extend feet Psychiatric: Grossly normal mood and affect. Speech fluent and appropriate. Frustrated with her back pain and thigh  pain     Lab Results:  Basename 12/13/11 0446 12/12/11 1850  NA 136 127*  K 3.5 3.8  CL 100 91*  CO2 28 29  GLUCOSE 70 78  BUN 12 16  CREATININE 0.59 0.65  CALCIUM 9.1 8.6  MG -- --  PHOS -- --    Basename 12/12/11 1850  AST 18  ALT 15  ALKPHOS 99  BILITOT 0.5  PROT 6.5  ALBUMIN 3.7    Basename 12/12/11 1850  LIPASE 14  AMYLASE --    Basename 12/12/11 1850  WBC 7.7  NEUTROABS 5.6  HGB 12.9  HCT 36.9  MCV 78.3  PLT 194   No results found for this basename: CKTOTAL:3,CKMB:3,CKMBINDEX:3,TROPONINI:3 in the last 72 hours No components found with this basename: POCBNP:3 No results found for this basename: DDIMER:2 in the last 72 hours No results found for this basename: HGBA1C:2 in the last 72 hours No results found for this basename: CHOL:2,HDL:2,LDLCALC:2,TRIG:2,CHOLHDL:2,LDLDIRECT:2 in the last 72 hours No results found for this basename: TSH,T4TOTAL,FREET3,T3FREE,THYROIDAB in the last 72 hours No results found for this basename: VITAMINB12:2,FOLATE:2,FERRITIN:2,TIBC:2,IRON:2,RETICCTPCT:2 in the last 72 hours  Studies/Results: Dg Chest 1 View  12/12/2011  *RADIOLOGY REPORT*  Clinical Data: 78 year old female with abdominal pain nausea and vomiting.  Left lung density on earlier AP chest.  CHEST - 1 VIEW  Comparison: 1909 hours the same day and earlier.  Findings: The lateral views of the chest are stable compared to 09/12/2007.  No pleural effusion or focal airspace disease.  Stable endplate  spurring in the thoracic spine.  Stable cardiac size and mediastinal contours.  IMPRESSION: No finding to correlate to the left lung density seen earlier, likely it was due to atelectasis.  Original Report Authenticated By: Harley Hallmark, M.D.   Dg Abd Acute W/chest  12/12/2011  *RADIOLOGY REPORT*  Clinical Data: 78 year old female with lower abdominal pain nausea and vomiting.  ACUTE ABDOMEN SERIES (ABDOMEN 2 VIEW & CHEST 1 VIEW)  Comparison: CT abdomen pelvis 12/09/2011 and  and earlier.  Findings: Mild elevation of the right hemidiaphragm.  Lung volumes are within normal limits.  Cardiac size and mediastinal contours are within normal limits.  Visualized tracheal air column is within normal limits.  No pneumothorax or pneumoperitoneum.  Increased streaky opacity along the left heart border.  This is not correlated on the lung bases of the recent comparison.  Right upper quadrant surgical clips. Nonobstructed bowel gas pattern.  Stable scoliosis.  Vascular calcifications.  IMPRESSION: 1. Nonobstructed bowel gas pattern, no free air. 2.  Patchy retrocardiac opacity is new since 12/09/2011.  Query atelectasis or developing infection.  Lateral view of the chest may be valuable.  Original Report Authenticated By: Harley Hallmark, M.D.   Medications: Scheduled Meds:   . sodium chloride   Intravenous STAT  . aspirin EC  81 mg Oral Daily  . docusate sodium  100 mg Oral BID  . insulin aspart  0-9 Units Subcutaneous TID WC  . insulin NPH  10 Units Subcutaneous BID AC  .  morphine injection  4 mg Intravenous Once  . ondansetron  4 mg Oral Once  . polyethylene glycol  17 g Oral BID  . potassium chloride SA  20 mEq Oral Daily  . senna  1 tablet Oral BID  . sodium chloride  1,000 mL Intravenous Once   Continuous Infusions:   . sodium chloride 75 mL/hr at 12/12/11 2348   PRN Meds:.acetaminophen, acetaminophen, alum & mag hydroxide-simeth, HYDROcodone-acetaminophen, morphine injection, ondansetron (ZOFRAN) IV, ondansetron, sodium phosphate, DISCONTD:  HYDROmorphone (DILAUDID) injection, DISCONTD: ondansetron (ZOFRAN) IV  Assessment/Plan:   Patient Active Problem List  Diagnoses Date Noted  . Nocturnal leg cramps - try magnesium oxide 400 mg twice a day  12/13/2011  . Nausea & vomiting - I suspect this is secondary to narcotic therapy possibly with obstipation contributing as well  - will discontinue narcotics and try tramadol and Toradol for pain  12/12/2011  .  Constipation - on review of abdominal x-ray from yesterday, appears to be quite a bit of retained stool. Given mag citrate, 6 ounces today and Dulcolax suppository x2  12/12/2011  . Hyponatremia - resolved with IV hydration  12/12/2011  . Diabetes mellitus - sliding scale insulin  12/12/2011  . Low back pain radiating to left leg - likely secondary to L4-5 severe spinal stenosis and subacute L5 compression fracture  PT OT to see  Observation admission  Disposition - possible discharge in a.m. with followup with Dr. Newell Coral on May 7 as an outpatient  12/12/2011     LOS: 1 day   [name] 12/13/2011, 9:41 AM

## 2011-12-14 LAB — GLUCOSE, CAPILLARY
Glucose-Capillary: 188 mg/dL — ABNORMAL HIGH (ref 70–99)
Glucose-Capillary: 231 mg/dL — ABNORMAL HIGH (ref 70–99)
Glucose-Capillary: 223 mg/dL — ABNORMAL HIGH (ref 70–99)
Glucose-Capillary: 234 mg/dL — ABNORMAL HIGH (ref 70–99)

## 2011-12-14 NOTE — Care Management Note (Signed)
    Page 1 of 2   12/15/2011     10:34:49 AM   CARE MANAGEMENT NOTE 12/15/2011  Patient:  [name], [name]   Account Number:  [id number]  Date Initiated:  12/14/2011  Documentation initiated by:  Lorenda Ishihara  Subjective/Objective Assessment:   78 yo female admitted with constipation, nausea, vomiting, dehydration. PTA lived at home with spouse. PCP is Dr. Kirby Funk.     Action/Plan:   Assist with HH arrangements   Anticipated DC Date:  12/15/2011   Anticipated DC Plan:  HOME W HOME HEALTH SERVICES      DC Planning Services  CM consult      [facility] Choice  HOME HEALTH   Choice offered to / List presented to:  C-4 Adult Children        HH arranged  HH-2 PT      [facility] agency  [facility]   Status of service:  Completed, signed off Medicare Important Message given?   (If response is "NO", the following Medicare IM given date fields will be blank) Date Medicare IM given:   Date Additional Medicare IM given:    Discharge Disposition:  HOME W HOME HEALTH SERVICES  Per UR Regulation:  Reviewed for med. necessity/level of care/duration of stay  If discussed at Long Length of Stay Meetings, dates discussed:    Comments:  12-15-11 Lorenda Ishihara RN CM 0900 Spoke with son and patient this am, ready for d/c. Chose [facility] for [facility] services, notified Darl Pikes with [facility] to arrange.  12-14-11 Lorenda Ishihara RN CM 1000 Spoke with patient at bedside. At times difficult to keep on task, had difficulty answering questions but would go off on another topic. 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 Patient is primary driver but husband also drives, she states he is forget full at times. States she manages meals and meds. She has a son and dtr who are supportive but she states her son travels alot and dtr has a full time job and 2 teenage children. She speaks of sisters that live in the area but are not in good health. Plan is to d/c home with [facility] services,  PT is recommended at this time, no DME needs, states son is getting w/c for patient. She plans to see her neurosurgeon tomorrow at d/c. She asked that I speak with her son, [name] at [phone]. I relayed with him the discussion I had with the patient and concerns for her care after d/c. I have faxed him a [facility] listing along with information regarding ALF's. He states they are financially strapped but was appreciative of the information. He is hoping for improvement in her condition after seeing the neurosurgeon tomorrow. Will await HH orders and decision from patient/son regarding agency choice.

## 2011-12-14 NOTE — Evaluation (Signed)
Physical Therapy Evaluation Patient Details Name: [name] MRN: [id number] DOB: 16-Oct-1932 Today's Date: 12/14/2011 Time: 1022-1058 PT Time Calculation (min): 36 min  PT Assessment / Plan / Recommendation Clinical Impression  Pt admitted with nausea and vomiting.  Pt also with L5 compression fx and severe L4-5 stenosis.  Pt to f/u with Dr. Shirlean [name] per chart upon d/c.  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  Pt would benefit from acute PT services in order to improve LE strengtha and improve independence with transfers and ambulation.  Pt agreeable to more assist at home (since she has increased back pain and reports spouse requires assist) however conrcerned about payment.  Recommend f/u therapy upon d/c whether ALF or SNF.    PT Assessment  Patient needs continued PT services    Follow Up Recommendations  Home health PT;Supervision/Assistance - 24 hour;Other (comment)    Equipment Recommendations  None recommended by PT;Other (comment) (pt report son is getting w/c for pt)    Frequency Min 3X/week    Precautions / Restrictions     Pertinent Vitals/Pain       Mobility  Bed Mobility Bed Mobility: Supine to Sit;Sit to Supine Supine to Sit: 4: Min assist Sit to Supine: 3: Mod assist Details for Bed Mobility Assistance: assist for trunk upon sitting and for LEs onto bed Transfers Transfers: Sit to Stand;Stand to Sit Sit to Stand: 4: Min guard;From bed;With upper extremity assist Stand to Sit: 4: Min guard;To bed;With upper extremity assist Details for Transfer Assistance: verbal cues for safe technique Ambulation/Gait Ambulation/Gait Assistance: 4: Min guard Ambulation Distance (Feet): 80 Feet Assistive device: Rolling walker Ambulation/Gait Assistance Details: pt reporting back pain increasing with ambulation limiting distance Gait Pattern: Step-through  pattern;Decreased stride length    Exercises     PT Goals Acute Rehab PT Goals PT Goal Formulation: With patient Time For Goal Achievement: 12/21/11 Potential to Achieve Goals: Good Pt will go Supine/Side to Sit: with supervision PT Goal: Supine/Side to Sit - Progress: Goal set today Pt will go Sit to Supine/Side: with supervision PT Goal: Sit to Supine/Side - Progress: Goal set today Pt will go Sit to Stand: with supervision PT Goal: Sit to Stand - Progress: Goal set today Pt will go Stand to Sit: with supervision PT Goal: Stand to Sit - Progress: Goal set today Pt will Ambulate: 51 - 150 feet;with supervision;with least restrictive assistive device PT Goal: Ambulate - Progress: Goal set today  Visit Information  Last PT Received On: 12/14/11 Assistance Needed: +1    Subjective Data  Subjective: "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  He had a stroke a few years ago."   Prior Functioning  Home Living Lives With: Spouse Type of Home: Apartment Home Access: Level entry Home Layout: One level Home Adaptive Equipment: Walker - rolling Additional Comments: Pt reports her son is supposed to be getting her a w/c. Prior Function Level of Independence: Independent with assistive device(s) Communication Communication: No difficulties    Cognition  Overall Cognitive Status: Appears within functional limits for tasks assessed/performed Arousal/Alertness: Awake/alert Orientation Level: Appears intact for tasks assessed Behavior During Session: [facility] for tasks performed Cognition - Other Comments: Pt reports she has been having trouble with her memory and when at home she usually writes everything down.    Extremity/Trunk Assessment Right Upper Extremity Assessment RUE ROM/Strength/Tone: [facility] for tasks assessed  Left Upper Extremity Assessment LUE ROM/Strength/Tone: WFL for tasks assessed Right Lower Extremity Assessment RLE ROM/Strength/Tone: Deficits;Due to pain RLE  ROM/Strength/Tone Deficits: pt reports pain with all MMT (pain in lower back, buttocks, and posterior thigh) RLE Sensation: WFL - Light Touch Left Lower Extremity Assessment LLE ROM/Strength/Tone: Deficits;Due to pain LLE ROM/Strength/Tone Deficits: pt reports pain with all MMT and worse in L LE  (pain in lower back, buttocks, and posterior thigh) LLE Sensation: WFL - Light Touch   Balance    End of Session PT - End of Session Equipment Utilized During Treatment: Gait belt Activity Tolerance: Patient limited by pain Patient left: in bed;with call bell/phone within reach;with family/visitor present   [name],[name] 12/14/2011, 12:54 PM Pager: [phone]

## 2011-12-14 NOTE — Progress Notes (Signed)
UR complete 

## 2011-12-14 NOTE — Progress Notes (Signed)
Subjective: Still some pain, 2 bowel movements last night  Objective: Vital signs in last 24 hours: Temp:  [98 F (36.7 C)-99.1 F (37.3 C)] 98.5 F (36.9 C) (05/06 0500) Pulse Rate:  [71-94] 80  (05/06 0500) Resp:  [18-20] 18  (05/06 0500) BP: (128-155)/(50-75) 155/61 mmHg (05/06 0500) SpO2:  [93 %-97 %] 95 % (05/06 0500) Weight change:  Last BM Date: 12/13/11  Intake/Output from previous day: 05/05 0701 - 05/06 0700 In: 125 [P.O.:125] Out: 150 [Urine:150] Intake/Output this shift: Total I/O In: -  Out: 150 [Urine:150]  General appearance: alert and cooperative GI: soft, non-tender; bowel sounds normal; no masses,  no organomegaly Neurologic: Motor: strength 5+/5 both lower extremities  Lab Results:  Basename 12/12/11 1850  WBC 7.7  HGB 12.9  HCT 36.9  PLT 194   BMET  Basename 12/13/11 0446 12/12/11 1850  NA 136 127*  K 3.5 3.8  CL 100 91*  CO2 28 29  GLUCOSE 70 78  BUN 12 16  CREATININE 0.59 0.65  CALCIUM 9.1 8.6    Studies/Results: Dg Chest 1 View  12/12/2011  *RADIOLOGY REPORT*  Clinical Data: 78 year old female with abdominal pain nausea and vomiting.  Left lung density on earlier AP chest.  CHEST - 1 VIEW  Comparison: 1909 hours the same day and earlier.  Findings: The lateral views of the chest are stable compared to 09/12/2007.  No pleural effusion or focal airspace disease.  Stable endplate spurring in the thoracic spine.  Stable cardiac size and mediastinal contours.  IMPRESSION: No finding to correlate to the left lung density seen earlier, likely it was due to atelectasis.  Original Report Authenticated By: Harley Hallmark, M.D.   Dg Abd Acute W/chest  12/12/2011  *RADIOLOGY REPORT*  Clinical Data: 78 year old female with lower abdominal pain nausea and vomiting.  ACUTE ABDOMEN SERIES (ABDOMEN 2 VIEW & CHEST 1 VIEW)  Comparison: CT abdomen pelvis 12/09/2011 and and earlier.  Findings: Mild elevation of the right hemidiaphragm.  Lung volumes are within  normal limits.  Cardiac size and mediastinal contours are within normal limits.  Visualized tracheal air column is within normal limits.  No pneumothorax or pneumoperitoneum.  Increased streaky opacity along the left heart border.  This is not correlated on the lung bases of the recent comparison.  Right upper quadrant surgical clips. Nonobstructed bowel gas pattern.  Stable scoliosis.  Vascular calcifications.  IMPRESSION: 1. Nonobstructed bowel gas pattern, no free air. 2.  Patchy retrocardiac opacity is new since 12/09/2011.  Query atelectasis or developing infection.  Lateral view of the chest may be valuable.  Original Report Authenticated By: Harley Hallmark, M.D.    Medications: I have reviewed the patient's current medications.  Assessment/Plan: Principal Problem:  *Nausea & vomiting resolved Active Problems:  Constipation continue laxatives  Hyponatremia resolved  Diabetes mellitus reasonable control  Low back pain radiating to left leg to see neurosurgery tomorrow.  Pt to see today. Plan discharge tomorrow am at latest     LOS: 2 days   [name] JOSEPH 12/14/2011, 6:20 AM

## 2011-12-15 MED ORDER — INSULIN NPH (HUMAN) (ISOPHANE) 100 UNIT/ML ~~LOC~~ SUSP: 55.0000 [IU] | Freq: Two times a day (BID) | SUBCUTANEOUS | Status: DC

## 2011-12-15 MED ORDER — POLYETHYLENE GLYCOL 3350 17 G PO PACK: 17.0000 g | PACK | Freq: Two times a day (BID) | ORAL | Status: AC

## 2011-12-15 MED ORDER — LORAZEPAM 0.5 MG PO TABS: 0.5000 mg | ORAL_TABLET | Freq: Three times a day (TID) | ORAL | Status: AC | PRN

## 2011-12-15 MED ORDER — TRAMADOL HCL 50 MG PO TABS: 50.0000 mg | ORAL_TABLET | Freq: Four times a day (QID) | ORAL | Status: AC | PRN

## 2011-12-15 MED ORDER — DSS 100 MG PO CAPS: 100.0000 mg | ORAL_CAPSULE | Freq: Two times a day (BID) | ORAL | Status: AC

## 2011-12-15 NOTE — Progress Notes (Signed)
Spoke with MD Valentina Lucks concerning patient's nausea and vomiting and if he still wanted patient discharged, pt vomited about 50 ml and was given zofran orally and has effective, vital signs are stable, MD stated that is ok to still discharge patient and he would call in a order for zofran for patient to her pharmacy. Means, Myrtie Hawk RN 12-15-11 9:59am

## 2011-12-15 NOTE — Discharge Instructions (Signed)
Call me if no bowel movement within 2 days.  Call me if blood sugars still dropping below 80.

## 2011-12-15 NOTE — Discharge Summary (Signed)
Physician Discharge Summary  Patient ID: [name] MRN: [id number] DOB/AGE: 12/29/1932 78 y.o.  Admit date: 12/12/2011 Discharge date: 12/15/2011  Admission Diagnoses: Nausea and vomiting Constipation Hyponatremia Diabetes mellitus Spinal stenosis   Discharge Diagnoses:  Principal Problem:  *Nausea & vomiting Active Problems:  Constipation  Hyponatremia  Diabetes mellitus  Low back pain radiating to left leg  Nocturnal leg cramps  Obstipation   Discharged Condition: good  Hospital Course: The patient was admitted with nausea vomiting and constipation. She had been taking hydrocodone regularly for back pain and leg pain from spinal stenosis. At admission her sodium levels 127. An acute abdominal series did show nonobstructive bowel gas pattern. The patient was treated aggressively with bowel regimen including MiraLAX, Senokot and stool softeners. She was also given a dose of magnesium citrate. She did have 2 bowel movements one day prior to discharge. Her abdominal exam was benign. Her hyponatremia resolved with IV fluids. Blood sugars remained under reasonable control and is on sliding scale. She did continue to have pain in the hospital, hydrocodone was discontinued and she was treated with tramadol. She has an appointment today with Dr. Shirlean Kelly for evaluation of her back and leg pain and spinal stenosis Discharge diet car modified diet CODE STATUS full code Activity seen by physical therapy, home physical therapy arranged  Consults: None  Significant Diagnostic Studies: labs: See above and radiology: KUB: Nonspecific bowel pattern  Treatments: IV hydration, analgesia: Tramadol and insulin: Lantus  Discharge Exam: Blood pressure 177/70, pulse 78, temperature 98.6 F (37 C), temperature source Oral, resp. rate 18, height 5\' 4"  (1.626 m), weight 79.924 kg (176 lb 3.2 oz), SpO2 96.00%. GI: soft, non-tender; bowel sounds normal; no masses,  no  organomegaly  Disposition: 01-Home or Self Care  Discharge Orders    Future Appointments: Provider: Department: Dept Phone: Center:   01/25/2012 7:30 AM Sherrie George, MD [facility] [phone] None     Medication List  As of 12/15/2011  6:41 AM   STOP taking these medications         HYDROcodone-acetaminophen 5-500 MG per tablet         TAKE these medications         amiloride-hydrochlorothiazide 5-50 MG tablet   Commonly known as: MODURETIC   Take 1 tablet by mouth daily.      aspirin EC 81 MG tablet   Take 81 mg by mouth daily.      cholecalciferol 1000 UNITS tablet   Commonly known as: VITAMIN D   Take 1,000 Units by mouth daily.      DSS 100 MG Caps   Take 100 mg by mouth 2 (two) times daily.      ibuprofen 200 MG tablet   Commonly known as: ADVIL,MOTRIN   Take 200 mg by mouth every 4 (four) hours as needed.      insulin lispro 100 UNIT/ML injection   Commonly known as: HUMALOG   Inject 3-5 Units into the skin 3 (three) times daily as needed. For CBG > 150; sliding scale.      insulin NPH 100 UNIT/ML injection   Commonly known as: HUMULIN N,NOVOLIN N   Inject 55 Units into the skin 2 (two) times daily before a meal. 55 units in AM and 45 units in PM      LORazepam 0.5 MG tablet   Commonly known as: ATIVAN   Take 1 tablet (0.5 mg total) by mouth every 8 (eight) hours as needed for  anxiety.      polyethylene glycol packet   Commonly known as: MIRALAX / GLYCOLAX   Take 17 g by mouth 2 (two) times daily.      potassium chloride SA 20 MEQ tablet   Commonly known as: K-DUR,KLOR-CON   Take 20 mEq by mouth daily.      traMADol 50 MG tablet   Commonly known as: ULTRAM   Take 1 tablet (50 mg total) by mouth every 6 (six) hours as needed.      vitamin E 400 UNIT capsule   Take 400 Units by mouth daily.           Follow-up Information    Follow up with Lillia Mountain, MD in 9 days.   Contact information:   [address], [address] [employer] [place], [place]. [place] [address] [phone]          Signed: Lillia Mountain 12/15/2011, 6:41 AM

## 2012-01-25 ENCOUNTER — Ambulatory Visit (INDEPENDENT_AMBULATORY_CARE_PROVIDER_SITE_OTHER): Payer: Medicare Other | Admitting: Ophthalmology

## 2012-01-26 ENCOUNTER — Inpatient Hospital Stay (HOSPITAL_COMMUNITY)
Admission: EM | Admit: 2012-01-26 | Discharge: 2012-02-08 | DRG: 552 | Disposition: A | Discharge status: Skilled Nursing Facility | Payer: Medicare Other | Attending: Internal Medicine | Admitting: Internal Medicine

## 2012-01-26 ENCOUNTER — Encounter (HOSPITAL_COMMUNITY): Payer: Self-pay | Admitting: Emergency Medicine

## 2012-01-26 DIAGNOSIS — R110 Nausea: Secondary | ICD-10-CM | POA: Diagnosis not present

## 2012-01-26 DIAGNOSIS — E876 Hypokalemia: Secondary | ICD-10-CM | POA: Diagnosis not present

## 2012-01-26 DIAGNOSIS — N390 Urinary tract infection, site not specified: Secondary | ICD-10-CM

## 2012-01-26 DIAGNOSIS — T40605A Adverse effect of unspecified narcotics, initial encounter: Secondary | ICD-10-CM | POA: Diagnosis not present

## 2012-01-26 DIAGNOSIS — G834 Cauda equina syndrome: Secondary | ICD-10-CM | POA: Diagnosis present

## 2012-01-26 DIAGNOSIS — E1142 Type 2 diabetes mellitus with diabetic polyneuropathy: Secondary | ICD-10-CM | POA: Diagnosis present

## 2012-01-26 DIAGNOSIS — IMO0002 Reserved for concepts with insufficient information to code with codable children: Principal | ICD-10-CM | POA: Diagnosis present

## 2012-01-26 DIAGNOSIS — F329 Major depressive disorder, single episode, unspecified: Secondary | ICD-10-CM | POA: Diagnosis present

## 2012-01-26 DIAGNOSIS — I10 Essential (primary) hypertension: Secondary | ICD-10-CM | POA: Diagnosis present

## 2012-01-26 DIAGNOSIS — E1149 Type 2 diabetes mellitus with other diabetic neurological complication: Secondary | ICD-10-CM | POA: Diagnosis present

## 2012-01-26 DIAGNOSIS — Z794 Long term (current) use of insulin: Secondary | ICD-10-CM

## 2012-01-26 DIAGNOSIS — M5137 Other intervertebral disc degeneration, lumbosacral region: Secondary | ICD-10-CM | POA: Diagnosis present

## 2012-01-26 DIAGNOSIS — R1310 Dysphagia, unspecified: Secondary | ICD-10-CM | POA: Diagnosis present

## 2012-01-26 DIAGNOSIS — M51379 Other intervertebral disc degeneration, lumbosacral region without mention of lumbar back pain or lower extremity pain: Secondary | ICD-10-CM | POA: Diagnosis present

## 2012-01-26 DIAGNOSIS — G8929 Other chronic pain: Secondary | ICD-10-CM | POA: Diagnosis present

## 2012-01-26 DIAGNOSIS — R410 Disorientation, unspecified: Secondary | ICD-10-CM | POA: Diagnosis not present

## 2012-01-26 DIAGNOSIS — M549 Dorsalgia, unspecified: Secondary | ICD-10-CM

## 2012-01-26 DIAGNOSIS — R079 Chest pain, unspecified: Secondary | ICD-10-CM | POA: Diagnosis not present

## 2012-01-26 DIAGNOSIS — E119 Type 2 diabetes mellitus without complications: Secondary | ICD-10-CM | POA: Diagnosis present

## 2012-01-26 DIAGNOSIS — M79605 Pain in left leg: Secondary | ICD-10-CM | POA: Diagnosis present

## 2012-01-26 DIAGNOSIS — M48061 Spinal stenosis, lumbar region without neurogenic claudication: Secondary | ICD-10-CM | POA: Diagnosis present

## 2012-01-26 DIAGNOSIS — F3289 Other specified depressive episodes: Secondary | ICD-10-CM | POA: Diagnosis present

## 2012-01-26 DIAGNOSIS — F19921 Other psychoactive substance use, unspecified with intoxication with delirium: Secondary | ICD-10-CM | POA: Diagnosis not present

## 2012-01-26 DIAGNOSIS — F411 Generalized anxiety disorder: Secondary | ICD-10-CM | POA: Diagnosis present

## 2012-01-26 DIAGNOSIS — K219 Gastro-esophageal reflux disease without esophagitis: Secondary | ICD-10-CM | POA: Diagnosis present

## 2012-01-26 DIAGNOSIS — Z79899 Other long term (current) drug therapy: Secondary | ICD-10-CM

## 2012-01-26 DIAGNOSIS — B952 Enterococcus as the cause of diseases classified elsewhere: Secondary | ICD-10-CM | POA: Diagnosis present

## 2012-01-26 DIAGNOSIS — K5900 Constipation, unspecified: Secondary | ICD-10-CM | POA: Diagnosis present

## 2012-01-26 LAB — DIFFERENTIAL
Basophils Absolute: 0 10*3/uL (ref 0.0–0.1)
Basophils Relative: 0 % (ref 0–1)
Eosinophils Absolute: 0.1 10*3/uL (ref 0.0–0.7)
Eosinophils Relative: 2 % (ref 0–5)
Lymphocytes Relative: 17 % (ref 12–46)
Lymphs Abs: 1.5 10*3/uL (ref 0.7–4.0)
Monocytes Absolute: 0.7 10*3/uL (ref 0.1–1.0)
Monocytes Relative: 8 % (ref 3–12)
Neutro Abs: 6.6 10*3/uL (ref 1.7–7.7)
Neutrophils Relative %: 73 % (ref 43–77)

## 2012-01-26 LAB — URINALYSIS, ROUTINE W REFLEX MICROSCOPIC
Bilirubin Urine: NEGATIVE
Glucose, UA: NEGATIVE mg/dL
Hgb urine dipstick: NEGATIVE
Ketones, ur: NEGATIVE mg/dL
Nitrite: NEGATIVE
Protein, ur: NEGATIVE mg/dL
Specific Gravity, Urine: 1.015 (ref 1.005–1.030)
Urobilinogen, UA: 0.2 mg/dL (ref 0.0–1.0)
pH: 7.5 (ref 5.0–8.0)

## 2012-01-26 LAB — CBC
Platelets: 286 10*3/uL (ref 150–400)
RBC: 4.79 MIL/uL (ref 3.87–5.11)
WBC: 9.1 10*3/uL (ref 4.0–10.5)

## 2012-01-26 LAB — BASIC METABOLIC PANEL WITH GFR
BUN: 11 mg/dL (ref 6–23)
CO2: 32 meq/L (ref 19–32)
Calcium: 9.4 mg/dL (ref 8.4–10.5)
Chloride: 93 meq/L — ABNORMAL LOW (ref 96–112)
Creatinine, Ser: 0.64 mg/dL (ref 0.50–1.10)
GFR calc Af Amer: >90 mL/min
GFR calc non Af Amer: 85 mL/min — ABNORMAL LOW
Glucose, Bld: 96 mg/dL (ref 70–99)
Potassium: 3.1 meq/L — ABNORMAL LOW (ref 3.5–5.1)
Sodium: 134 meq/L — ABNORMAL LOW (ref 135–145)

## 2012-01-26 LAB — URINE MICROSCOPIC-ADD ON

## 2012-01-26 MED ORDER — MORPHINE SULFATE 4 MG/ML IJ SOLN
6.0000 mg | Freq: Once | INTRAMUSCULAR | Status: AC
  Administered 2012-01-26: 6 mg via INTRAVENOUS
  Filled 2012-01-26: qty 2

## 2012-01-26 NOTE — ED Notes (Signed)
Patient aware of need for urine testing. Patient unable to void at this time. Patient encouraged to call for toileting assistance  

## 2012-01-26 NOTE — ED Notes (Signed)
Pt states she has been having back and leg pain for the past couple of months that has gotten worse in the past 3-4 days.  Pt is having difficulty walking and getting up due to the pain.  Pt denies any falls or recent injuries.  Pt has hx of arthritis and sciatic nerve pain.  Pt has not been able to control pain with pills or fentanyl patch.

## 2012-01-26 NOTE — ED Notes (Signed)
Family at bedside would like assistance to finding their mother more assistive care and help finding a pain specialist

## 2012-01-26 NOTE — ED Notes (Signed)
Social work at bedside.  

## 2012-01-26 NOTE — Progress Notes (Signed)
CSW was consulted by RN to provide family with information on placement. CSW met with the [name] and her two daughters. [name] and daughters state that they feel the [name] needs to be placed into either an ALF or SNF as she can not manage at home by herself. Family stated that they wanted the [name] to be admitted however verbalized understanding that no criteria has been met for a medical admission and therefore Medicare would not cover the stay. CSW provided the family with information on local SNF's and ALF'a and explained the differences with the facilities. CSW explained that a [name]/OT evaluation would need to provide the information on what level of care is needed. CSW found that the [name] receives home health services through [facility]. CSW explained that the [name] could be placed in a facility from home and had the EDP order a home health SW to be added to [facility]. [name] and family were agreeable to this plan. CSW discussed with the [name]'s daughter, Tresa Endo, the need to get the [name]'s PCP Dr. Valentina Lucks to complete the [facility] for placement as well. Daughter was willing to provide extra support to the [name] at discharge until placement could be found. At this time, no further CSW needs were identified.    CSW has left a vm for Kim, ED CM, to contact [facility] on 01/28/12 to ensure a SW was added.

## 2012-01-26 NOTE — ED Provider Notes (Addendum)
History     CSN: [id number]  Arrival date & time 01/26/12  1804   First MD Initiated Contact with Patient 01/26/12 2131      Chief Complaint  Patient presents with  . Back Pain  . Leg Pain    (Consider location/radiation/quality/duration/timing/severity/associated sxs/prior treatment) HPI Comments: Pt has a hx of chronic back pain.  Pain is to lower back, across both sides and radiates down into legs.  Has been taking tramadol for pain and was started on a fentanyl patch today by her PMD.  Pt says that pain has been getting worse over last few days.  Has chronic numbness and pain to legs which is unchanged.  Has weakness to legs and says that she is unable to ambulate due to pain.  Pain is similar to her chronic pain, but is worsening.  Has had injections to back before.  Has been told that she is not a candidate for surgery.  No recent injury.  Has some chronic intermittent pains to abd, but says that it is unchanged.  Has chronic swelling in left leg as compared to right, but says that this is also unchanged.  Her daughter says that pt's husband can no longer care for her at home and they want her placed in NH.  Has urinary incontinence which she says has been going on since abdominal surgery in April.  Patient is a 78 y.o. female presenting with back pain and leg pain. The history is provided by the patient and a relative.  Back Pain  This is a chronic problem. Associated symptoms include numbness, abdominal pain, leg pain and weakness. Pertinent negatives include no chest pain, no fever and no headaches.  Leg Pain  Associated symptoms include numbness.    Past Medical History  Diagnosis Date  . Diabetes mellitus     greater than 30 years  . Hypertension   . Incontinence of urine   . Diarrhea   . Neuropathy     bilateral feet  . Arthritis     lower back/hands  . GERD (gastroesophageal reflux disease)     Past Surgical History  Procedure Date  . Eye surgery    bil.cataract-unknown year  . Incontinence surgery 2005  . Tonsillectomy 1964  . Abdominal hysterectomy     1982  . Ovarian cyst surgery 1972  . Breast biopsy 1990  . Cholecystectomy 1988  . Cystoscopy 2009    macroplastique inj  . Rectocele repair 10/15/2011    Procedure: POSTERIOR REPAIR (RECTOCELE);  Surgeon: Kathi Ludwig, MD;  Location: [facility];  Service: Urology;;  posterior vaginal vault repair with sacral spinus repair with graft  . Cystoscopy 10/15/2011    Procedure: CYSTOSCOPY;  Surgeon: Kathi Ludwig, MD;  Location: [facility];  Service: Urology;  Laterality: N/A;    Family History  Problem Relation Age of Onset  . Osteoarthritis Sister   . Osteoarthritis Brother     History  Substance Use Topics  . Smoking status: Never Smoker   . Smokeless tobacco: Not on file  . Alcohol Use: No    OB History    Grav Para Term Preterm Abortions TAB SAB Ect Mult Living                  Review of Systems  Constitutional: Positive for fatigue. Negative for fever, chills and diaphoresis.  HENT: Negative for congestion, rhinorrhea and sneezing.   Eyes: Negative.   Respiratory: Negative for cough, chest  tightness and shortness of breath.   Cardiovascular: Negative for chest pain and leg swelling.  Gastrointestinal: Positive for abdominal pain. Negative for nausea, vomiting, diarrhea and blood in stool.  Genitourinary: Negative for frequency, hematuria, flank pain and difficulty urinating.  Musculoskeletal: Positive for back pain. Negative for arthralgias.  Skin: Negative for rash.  Neurological: Positive for weakness and numbness. Negative for dizziness, speech difficulty and headaches.    Allergies  Neosporin and Sulfa antibiotics  Home Medications   Current Outpatient Rx  Name Route Sig Dispense Refill  . AMILORIDE-HYDROCHLOROTHIAZIDE 5-50 MG PO TABS Oral Take 1 tablet by mouth daily.    . ASPIRIN EC 81 MG PO TBEC Oral Take  81 mg by mouth daily.    Marland Kitchen VITAMIN D 1000 UNITS PO TABS Oral Take 1,000 Units by mouth daily.    . FENTANYL 50 MCG/HR TD PT72 Transdermal Place 1 patch onto the skin every 3 (three) days.    . INSULIN LISPRO (HUMAN) 100 UNIT/ML [place] SOLN Subcutaneous Inject 5 Units into the skin 2 (two) times daily. For CBG > 150; sliding scale.    . INSULIN ISOPHANE HUMAN 100 UNIT/ML [place] SUSP Subcutaneous Inject 40 Units into the skin 2 (two) times daily before a meal. 55 units in AM and 45 units in PM    . LORAZEPAM 0.5 MG PO TABS Oral Take 0.5 mg by mouth every 8 (eight) hours as needed. anxiety    . POTASSIUM CHLORIDE CRYS ER 20 MEQ PO TBCR Oral Take 20 mEq by mouth daily.    . TRAMADOL HCL 50 MG PO TABS Oral Take 50 mg by mouth every 6 (six) hours as needed. pain    . VITAMIN E 400 UNITS PO CAPS Oral Take 400 Units by mouth daily.    Marland Kitchen CIPROFLOXACIN HCL 500 MG PO TABS Oral Take 1 tablet (500 mg total) by mouth 2 (two) times daily. 14 tablet 0    BP 148/65  Pulse 81  Temp 98.7 F (37.1 C) (Oral)  Resp 17  SpO2 98%  Physical Exam  Constitutional: She is oriented to person, place, and time. She appears well-developed and well-nourished.  HENT:  Head: Normocephalic and atraumatic.  Eyes: Pupils are equal, round, and reactive to light.  Neck: Normal range of motion. Neck supple.  Cardiovascular: Normal rate, regular rhythm and normal heart sounds.   Pulmonary/Chest: Effort normal and breath sounds normal. No respiratory distress. She has no wheezes. She has no rales. She exhibits no tenderness.  Abdominal: Soft. Bowel sounds are normal. There is tenderness (mild tenderness to suprapubic area). There is no rebound and no guarding.  Musculoskeletal: Normal range of motion. She exhibits edema.       Mild edema to left leg as compared to right. (pt says that this is normal).  Mild tenderness to both lower legs.    Lymphadenopathy:    She has no cervical adenopathy.  Neurological: She is alert and oriented to  person, place, and time. She has normal strength.       Decreased sensation to LT bilateral lower extremities  Skin: Skin is warm and dry. No rash noted.  Psychiatric: She has a normal mood and affect.    ED Course  Procedures (including critical care time)  Results for orders placed during the hospital encounter of 01/26/12  CBC      Component Value Range   WBC 9.1  4.0 - 10.5 K/uL   RBC 4.79  3.87 - 5.11 MIL/uL  Hemoglobin 12.9  12.0 - 15.0 g/dL   HCT 16.1  09.6 - 04.5 %   MCV 80.8  78.0 - 100.0 fL   MCH 26.9  26.0 - 34.0 pg   MCHC 33.3  30.0 - 36.0 g/dL   RDW 40.9  81.1 - 91.4 %   Platelets 286  150 - 400 K/uL  DIFFERENTIAL      Component Value Range   Neutrophils Relative 73  43 - 77 %   Neutro Abs 6.6  1.7 - 7.7 K/uL   Lymphocytes Relative 17  12 - 46 %   Lymphs Abs 1.5  0.7 - 4.0 K/uL   Monocytes Relative 8  3 - 12 %   Monocytes Absolute 0.7  0.1 - 1.0 K/uL   Eosinophils Relative 2  0 - 5 %   Eosinophils Absolute 0.1  0.0 - 0.7 K/uL   Basophils Relative 0  0 - 1 %   Basophils Absolute 0.0  0.0 - 0.1 K/uL  BASIC METABOLIC PANEL      Component Value Range   Sodium 134 (*) 135 - 145 mEq/L   Potassium 3.1 (*) 3.5 - 5.1 mEq/L   Chloride 93 (*) 96 - 112 mEq/L   CO2 32  19 - 32 mEq/L   Glucose, Bld 96  70 - 99 mg/dL   BUN 11  6 - 23 mg/dL   Creatinine, Ser 7.82  0.50 - 1.10 mg/dL   Calcium 9.4  8.4 - 95.6 mg/dL   GFR calc non Af Amer 85 (*) >90 mL/min   GFR calc Af Amer >90  >90 mL/min  URINALYSIS, ROUTINE W REFLEX MICROSCOPIC      Component Value Range   Color, Urine YELLOW  YELLOW   APPearance CLOUDY (*) CLEAR   Specific Gravity, Urine 1.015  1.005 - 1.030   pH 7.5  5.0 - 8.0   Glucose, UA NEGATIVE  NEGATIVE mg/dL   Hgb urine dipstick NEGATIVE  NEGATIVE   Bilirubin Urine NEGATIVE  NEGATIVE   Ketones, ur NEGATIVE  NEGATIVE mg/dL   Protein, ur NEGATIVE  NEGATIVE mg/dL   Urobilinogen, UA 0.2  0.0 - 1.0 mg/dL   Nitrite NEGATIVE  NEGATIVE   Leukocytes, UA LARGE  (*) NEGATIVE  URINE MICROSCOPIC-ADD ON      Component Value Range   WBC, UA TOO NUMEROUS TO COUNT  <3 WBC/hpf   Bacteria, UA FEW (*) RARE   No results found.    1. Back pain   2. UTI (lower urinary tract infection)       MDM  Pt with exacerbation of her chronic back pain.  There is no indication of new deficits.  Has some symptoms of UTI, so will tx for this.  Social worker has come and seen pt and talked with family. Pt does not meet criteria for admission.  Will order homehealth social work consult to go to home tomorrow for assistance with NH placement.  Daughter to help take care of pt at home until then.  Will give rx for vicodin for breakthrough pain, but advised daughter to monitor for increased drowsiness.  Advised pt and daughter that she might not need it once fentanyl patch is starting to work   Pt not any better.  Spoke with hospitalist who will admit pt     Rolan Bucco, MD 01/27/12 0028  Rolan Bucco, MD 01/27/12 [phone]

## 2012-01-27 ENCOUNTER — Encounter (HOSPITAL_COMMUNITY): Payer: Self-pay | Admitting: Internal Medicine

## 2012-01-27 DIAGNOSIS — I10 Essential (primary) hypertension: Secondary | ICD-10-CM | POA: Diagnosis present

## 2012-01-27 DIAGNOSIS — E782 Mixed hyperlipidemia: Secondary | ICD-10-CM

## 2012-01-27 DIAGNOSIS — N390 Urinary tract infection, site not specified: Secondary | ICD-10-CM | POA: Diagnosis present

## 2012-01-27 DIAGNOSIS — M549 Dorsalgia, unspecified: Secondary | ICD-10-CM | POA: Diagnosis present

## 2012-01-27 DIAGNOSIS — E1165 Type 2 diabetes mellitus with hyperglycemia: Secondary | ICD-10-CM

## 2012-01-27 DIAGNOSIS — M545 Low back pain: Secondary | ICD-10-CM

## 2012-01-27 LAB — GLUCOSE, CAPILLARY
Glucose-Capillary: 121 mg/dL — ABNORMAL HIGH (ref 70–99)
Glucose-Capillary: 214 mg/dL — ABNORMAL HIGH (ref 70–99)

## 2012-01-27 LAB — COMPREHENSIVE METABOLIC PANEL
Albumin: 2.8 g/dL — ABNORMAL LOW (ref 3.5–5.2)
Alkaline Phosphatase: 91 U/L (ref 39–117)
BUN: 10 mg/dL (ref 6–23)
CO2: 29 mEq/L (ref 19–32)
Chloride: 96 mEq/L (ref 96–112)
GFR calc Af Amer: >90 mL/min (ref >90)
Glucose, Bld: 141 mg/dL — ABNORMAL HIGH (ref 70–99)
Potassium: 3.9 mEq/L (ref 3.5–5.1)
Total Bilirubin: 0.4 mg/dL (ref 0.3–1.2)

## 2012-01-27 LAB — CBC
MCV: 82.4 fL (ref 78.0–100.0)
Platelets: 272 10*3/uL (ref 150–400)
RDW: 13.9 % (ref 11.5–15.5)
WBC: 8.5 10*3/uL (ref 4.0–10.5)

## 2012-01-27 MED ORDER — ENSURE COMPLETE PO LIQD
237.0000 mL | Freq: Two times a day (BID) | ORAL | Status: DC
  Administered 2012-01-28 – 2012-01-30 (×4): 237 mL via ORAL

## 2012-01-27 MED ORDER — PREGABALIN 50 MG PO CAPS
50.0000 mg | ORAL_CAPSULE | Freq: Two times a day (BID) | ORAL | Status: DC
  Administered 2012-01-27 (×2): 50 mg via ORAL
  Filled 2012-01-27 (×3): qty 1

## 2012-01-27 MED ORDER — TRIAMTERENE-HCTZ 75-50 MG PO TABS
1.0000 | ORAL_TABLET | Freq: Every day | ORAL | Status: DC
  Administered 2012-01-27 – 2012-01-30 (×4): 1 via ORAL
  Filled 2012-01-27 (×5): qty 1

## 2012-01-27 MED ORDER — LORAZEPAM 0.5 MG PO TABS
0.5000 mg | ORAL_TABLET | Freq: Three times a day (TID) | ORAL | Status: DC | PRN
  Administered 2012-01-28: 0.5 mg via ORAL
  Filled 2012-01-27: qty 1

## 2012-01-27 MED ORDER — ONDANSETRON HCL 4 MG PO TABS
4.0000 mg | ORAL_TABLET | Freq: Four times a day (QID) | ORAL | Status: DC | PRN
  Filled 2012-01-27: qty 1

## 2012-01-27 MED ORDER — POTASSIUM CHLORIDE CRYS ER 20 MEQ PO TBCR
20.0000 meq | EXTENDED_RELEASE_TABLET | Freq: Every day | ORAL | Status: DC
  Administered 2012-01-27 – 2012-01-30 (×4): 20 meq via ORAL
  Filled 2012-01-27 (×5): qty 1

## 2012-01-27 MED ORDER — MORPHINE SULFATE 2 MG/ML IJ SOLN
2.0000 mg | INTRAMUSCULAR | Status: DC | PRN
  Administered 2012-01-27 – 2012-01-28 (×6): 2 mg via INTRAVENOUS
  Filled 2012-01-27 (×6): qty 1

## 2012-01-27 MED ORDER — INSULIN NPH (HUMAN) (ISOPHANE) 100 UNIT/ML ~~LOC~~ SUSP
10.0000 [IU] | Freq: Two times a day (BID) | SUBCUTANEOUS | Status: DC
  Filled 2012-01-27: qty 10

## 2012-01-27 MED ORDER — ACETAMINOPHEN 325 MG PO TABS: 650.0000 mg | ORAL_TABLET | Freq: Four times a day (QID) | ORAL | Status: DC | PRN

## 2012-01-27 MED ORDER — CIPROFLOXACIN HCL 500 MG PO TABS
500.0000 mg | ORAL_TABLET | Freq: Once | ORAL | Status: AC
  Administered 2012-01-27: 500 mg via ORAL
  Filled 2012-01-27: qty 1

## 2012-01-27 MED ORDER — CIPROFLOXACIN HCL 500 MG PO TABS: 500.0000 mg | ORAL_TABLET | Freq: Two times a day (BID) | ORAL | Status: AC

## 2012-01-27 MED ORDER — MORPHINE SULFATE 4 MG/ML IJ SOLN
4.0000 mg | Freq: Once | INTRAMUSCULAR | Status: AC
  Administered 2012-01-27: 4 mg via INTRAVENOUS
  Filled 2012-01-27: qty 1

## 2012-01-27 MED ORDER — SODIUM CHLORIDE 0.9 % IV SOLN: INTRAVENOUS | Status: AC

## 2012-01-27 MED ORDER — INSULIN NPH (HUMAN) (ISOPHANE) 100 UNIT/ML ~~LOC~~ SUSP
30.0000 [IU] | Freq: Every day | SUBCUTANEOUS | Status: DC
  Administered 2012-01-27 – 2012-01-31 (×5): 30 [IU] via SUBCUTANEOUS
  Filled 2012-01-27: qty 10

## 2012-01-27 MED ORDER — INSULIN ASPART 100 UNIT/ML ~~LOC~~ SOLN
0.0000 [IU] | Freq: Three times a day (TID) | SUBCUTANEOUS | Status: DC
  Administered 2012-01-27: 5 [IU] via SUBCUTANEOUS
  Administered 2012-01-27: 3 [IU] via SUBCUTANEOUS
  Administered 2012-01-27 – 2012-01-28 (×2): 1 [IU] via SUBCUTANEOUS
  Administered 2012-01-28: 5 [IU] via SUBCUTANEOUS
  Administered 2012-01-28: 2 [IU] via SUBCUTANEOUS
  Administered 2012-01-29: 3 [IU] via SUBCUTANEOUS
  Administered 2012-01-29: 2 [IU] via SUBCUTANEOUS
  Administered 2012-01-29: 3 [IU] via SUBCUTANEOUS
  Administered 2012-01-30 (×3): 5 [IU] via SUBCUTANEOUS
  Administered 2012-01-31: 1 [IU] via SUBCUTANEOUS

## 2012-01-27 MED ORDER — SODIUM CHLORIDE 0.9 % IJ SOLN
3.0000 mL | Freq: Two times a day (BID) | INTRAMUSCULAR | Status: DC
  Administered 2012-01-28 – 2012-01-29 (×2): 3 mL via INTRAVENOUS

## 2012-01-27 MED ORDER — ACETAMINOPHEN 650 MG RE SUPP: 650.0000 mg | Freq: Four times a day (QID) | RECTAL | Status: DC | PRN

## 2012-01-27 MED ORDER — DEXTROSE 5 % IV SOLN
1.0000 g | Freq: Every day | INTRAVENOUS | Status: DC
  Administered 2012-01-27 – 2012-01-28 (×3): 1 g via INTRAVENOUS
  Filled 2012-01-27 (×3): qty 10

## 2012-01-27 MED ORDER — DOCUSATE SODIUM 100 MG PO CAPS
100.0000 mg | ORAL_CAPSULE | Freq: Two times a day (BID) | ORAL | Status: DC
  Administered 2012-01-27 (×2): 100 mg via ORAL
  Filled 2012-01-27 (×4): qty 1

## 2012-01-27 MED ORDER — INSULIN NPH (HUMAN) (ISOPHANE) 100 UNIT/ML ~~LOC~~ SUSP
20.0000 [IU] | Freq: Every day | SUBCUTANEOUS | Status: DC
  Administered 2012-01-27 – 2012-01-30 (×4): 20 [IU] via SUBCUTANEOUS
  Filled 2012-01-27: qty 10

## 2012-01-27 MED ORDER — POTASSIUM CHLORIDE CRYS ER 20 MEQ PO TBCR
40.0000 meq | EXTENDED_RELEASE_TABLET | Freq: Once | ORAL | Status: AC
  Administered 2012-01-27: 40 meq via ORAL
  Filled 2012-01-27: qty 2

## 2012-01-27 MED ORDER — TRAMADOL HCL 50 MG PO TABS
50.0000 mg | ORAL_TABLET | Freq: Four times a day (QID) | ORAL | Status: DC | PRN
  Administered 2012-01-28: 50 mg via ORAL
  Filled 2012-01-27: qty 1

## 2012-01-27 MED ORDER — POLYETHYLENE GLYCOL 3350 17 G PO PACK
17.0000 g | PACK | Freq: Every day | ORAL | Status: DC
  Administered 2012-01-27 – 2012-01-29 (×3): 17 g via ORAL
  Filled 2012-01-27 (×5): qty 1

## 2012-01-27 MED ORDER — FENTANYL 50 MCG/HR TD PT72: 50.0000 ug | MEDICATED_PATCH | TRANSDERMAL | Status: DC

## 2012-01-27 MED ORDER — ONDANSETRON HCL 4 MG/2ML IJ SOLN
4.0000 mg | Freq: Four times a day (QID) | INTRAMUSCULAR | Status: DC | PRN
  Administered 2012-01-27 – 2012-02-07 (×17): 4 mg via INTRAVENOUS
  Filled 2012-01-27 (×17): qty 2

## 2012-01-27 NOTE — H&P (Signed)
[name] is an 78 y.o. female.   PCP - Dr.John Valentina Lucks. Chief Complaint: Severe low-back pain. HPI: 78 year old female with known history of spinal stenosis and L5 compression fracture, diabetes mellitus type 2, hypertension has been experiencing worsening of her low back pain over the last 24-48 hours. She had gone to her PCP who had started on Duragesic patch. Despite which the pain has worsened and patient has come to ER. In the ER on exam it was found patient having severe pain on even minimal movement. Patient so far has received 10 mg of IV morphine for pain relief. In addition patient is also found to be having possible UTI. Patient has no urinary incontinence though patient does give history of urge incontinence. No bowel incontinence. She is able to move her lower extremities but causes pain. Denies any fall. In addition in the ER patient was found to have difficulty swallowing which family states is chronic and causes choking episodes. At this time patient will be admitted for her severe low back pain. Speaking to patient's daughter patient has had followed with Dr. Newell Coral spine surgeon who had given her 2 doses of steroid shots the last one 3 weeks ago. The shots have given her minimal pain relief. At this time as per family conservative approach is what is being planned and surgery was not considered.  Past Medical History  Diagnosis Date  . Diabetes mellitus     greater than 30 years  . Hypertension   . Incontinence of urine   . Diarrhea   . Neuropathy     bilateral feet  . Arthritis     lower back/hands  . GERD (gastroesophageal reflux disease)     Past Surgical History  Procedure Date  . Eye surgery     bil.cataract-unknown year  . Incontinence surgery 2005  . Tonsillectomy 1964  . Abdominal hysterectomy     1982  . Ovarian cyst surgery 1972  . Breast biopsy 1990  . Cholecystectomy 1988  . Cystoscopy 2009    macroplastique inj  . Rectocele repair 10/15/2011   Procedure: POSTERIOR REPAIR (RECTOCELE);  Surgeon: Kathi Ludwig, MD;  Location: [facility];  Service: Urology;;  posterior vaginal vault repair with sacral spinus repair with graft  . Cystoscopy 10/15/2011    Procedure: CYSTOSCOPY;  Surgeon: Kathi Ludwig, MD;  Location: [facility];  Service: Urology;  Laterality: N/A;    Family History  Problem Relation Age of Onset  . Osteoarthritis Sister   . Osteoarthritis Brother    Social History:  reports that she has never smoked. She does not have any smokeless tobacco history on file. She reports that she does not drink alcohol or use illicit drugs.  Allergies:  Allergies  Allergen Reactions  . Neosporin (Neomycin-Bacitracin Zn-Polymyx) Rash  . Sulfa Antibiotics Rash    Medications Prior to Admission  Medication Sig Dispense Refill  . amiloride-hydrochlorothiazide (MODURETIC) 5-50 MG tablet Take 1 tablet by mouth daily.      Marland Kitchen aspirin EC 81 MG tablet Take 81 mg by mouth daily.      . cholecalciferol (VITAMIN D) 1000 UNITS tablet Take 1,000 Units by mouth daily.      . fentaNYL (DURAGESIC - DOSED MCG/HR) 50 MCG/HR Place 1 patch onto the skin every 3 (three) days.      . insulin lispro (HUMALOG) 100 UNIT/ML injection Inject 5 Units into the skin 2 (two) times daily. For CBG > 150; sliding scale.      Marland Kitchen  insulin NPH (HUMULIN N,NOVOLIN N) 100 UNIT/ML injection Inject 40 Units into the skin 2 (two) times daily before a meal. 55 units in AM and 45 units in PM      . LORazepam (ATIVAN) 0.5 MG tablet Take 0.5 mg by mouth every 8 (eight) hours as needed. anxiety      . potassium chloride SA (K-DUR,KLOR-CON) 20 MEQ tablet Take 20 mEq by mouth daily.      . traMADol (ULTRAM) 50 MG tablet Take 50 mg by mouth every 6 (six) hours as needed. pain      . vitamin E 400 UNIT capsule Take 400 Units by mouth daily.        Results for orders placed during the hospital encounter of 01/26/12 (from the past 48 hour(s))    CBC     Status: Normal   Collection Time   01/26/12 10:25 PM      Component Value Range Comment   WBC 9.1  4.0 - 10.5 K/uL    RBC 4.79  3.87 - 5.11 MIL/uL    Hemoglobin 12.9  12.0 - 15.0 g/dL    HCT 98.1  19.1 - 47.8 %    MCV 80.8  78.0 - 100.0 fL    MCH 26.9  26.0 - 34.0 pg    MCHC 33.3  30.0 - 36.0 g/dL    RDW 29.5  62.1 - 30.8 %    Platelets 286  150 - 400 K/uL   DIFFERENTIAL     Status: Normal   Collection Time   01/26/12 10:25 PM      Component Value Range Comment   Neutrophils Relative 73  43 - 77 %    Neutro Abs 6.6  1.7 - 7.7 K/uL    Lymphocytes Relative 17  12 - 46 %    Lymphs Abs 1.5  0.7 - 4.0 K/uL    Monocytes Relative 8  3 - 12 %    Monocytes Absolute 0.7  0.1 - 1.0 K/uL    Eosinophils Relative 2  0 - 5 %    Eosinophils Absolute 0.1  0.0 - 0.7 K/uL    Basophils Relative 0  0 - 1 %    Basophils Absolute 0.0  0.0 - 0.1 K/uL   BASIC METABOLIC PANEL     Status: Abnormal   Collection Time   01/26/12 10:25 PM      Component Value Range Comment   Sodium 134 (*) 135 - 145 mEq/L    Potassium 3.1 (*) 3.5 - 5.1 mEq/L    Chloride 93 (*) 96 - 112 mEq/L    CO2 32  19 - 32 mEq/L    Glucose, Bld 96  70 - 99 mg/dL    BUN 11  6 - 23 mg/dL    Creatinine, Ser 6.57  0.50 - 1.10 mg/dL    Calcium 9.4  8.4 - 84.6 mg/dL    GFR calc non Af Amer 85 (*) >90 mL/min    GFR calc Af Amer >90  >90 mL/min   URINALYSIS, ROUTINE W REFLEX MICROSCOPIC     Status: Abnormal   Collection Time   01/26/12 11:30 PM      Component Value Range Comment   Color, Urine YELLOW  YELLOW    APPearance CLOUDY (*) CLEAR    Specific Gravity, Urine 1.015  1.005 - 1.030    pH 7.5  5.0 - 8.0    Glucose, UA NEGATIVE  NEGATIVE mg/dL    Hgb urine dipstick  NEGATIVE  NEGATIVE    Bilirubin Urine NEGATIVE  NEGATIVE    Ketones, ur NEGATIVE  NEGATIVE mg/dL    Protein, ur NEGATIVE  NEGATIVE mg/dL    Urobilinogen, UA 0.2  0.0 - 1.0 mg/dL    Nitrite NEGATIVE  NEGATIVE    Leukocytes, UA LARGE (*) NEGATIVE   URINE  MICROSCOPIC-ADD ON     Status: Abnormal   Collection Time   01/26/12 11:30 PM      Component Value Range Comment   WBC, UA TOO NUMEROUS TO COUNT  <3 WBC/hpf WITH CLUMPS   Bacteria, UA FEW (*) RARE    No results found.  Review of Systems  Constitutional: Negative.   HENT: Negative.   Eyes: Negative.   Respiratory: Negative.   Cardiovascular: Negative.   Gastrointestinal: Negative.   Genitourinary: Negative.   Musculoskeletal: Positive for back pain.  Skin: Negative.   Neurological: Negative.   Endo/Heme/Allergies: Negative.   Psychiatric/Behavioral: Negative.     Blood pressure 149/71, pulse 80, temperature 99.3 F (37.4 C), temperature source Oral, resp. rate 16, SpO2 93.00%. Physical Exam  Constitutional: She is oriented to person, place, and time. She appears well-developed and well-nourished. No distress.  HENT:  Head: Normocephalic and atraumatic.  Right Ear: External ear normal.  Left Ear: External ear normal.  Nose: Nose normal.  Mouth/Throat: Oropharynx is clear and moist. No oropharyngeal exudate.  Eyes: Conjunctivae are normal. Pupils are equal, round, and reactive to light. Right eye exhibits no discharge. Left eye exhibits no discharge. No scleral icterus.  Neck: Normal range of motion. Neck supple.  Cardiovascular: Normal rate and regular rhythm.   Respiratory: Effort normal and breath sounds normal. No respiratory distress. She has no wheezes. She has no rales.  GI: Soft. Bowel sounds are normal. She exhibits no distension. There is no tenderness. There is no rebound.  Musculoskeletal:       Severe back pain on minimal movement of her lower extremities.  Neurological: She is alert and oriented to person, place, and time.       Moves all extremities.  Skin: Skin is warm and dry. She is not diaphoretic.  Psychiatric: Her behavior is normal.     Assessment/Plan #1. Severe low back pain in a patient with known history of spinal stenosis and compression fracture  of L5 - since patient's pain has worsened last 24 hours it is concerning that patient may have had a new compression fracture. I have ordered MRI L-spine. We will continue with pain relief medications and PT consult.  #2. Possible UTI  - check urine culture and until then continue ceftriaxone.  #3. Difficulty swallowing  - I have ordered speech therapy evaluation.  #4. Diabetes mellitus 2 - patient daughter states her blood sugar has been sometimes running low due to poor oral intake. I have decreased her NPH dose to 10 units twice a day with close followup of CBG with sliding scale coverage.  #5. Hypertension - continue present medications.  CODE STATUS - full code. Patient is admitted under Dr. Jone Baseman service.  Junetta Hearn N. 01/27/2012, 3:22 AM

## 2012-01-27 NOTE — Progress Notes (Signed)
Subjective: Severe pain with any movement, radiates into both legs, no BM 2-3 days, poor appetite, cannot be cared for at home in this state  Objective: Vital signs in last 24 hours: Temp:  [98.7 F (37.1 C)-99.3 F (37.4 C)] 99.2 F (37.3 C) (06/19 0532) Pulse Rate:  [75-83] 83  (06/19 0532) Resp:  [14-18] 18  (06/19 0532) BP: (136-149)/(63-71) 136/64 mmHg (06/19 0532) SpO2:  [90 %-98 %] 90 % (06/19 0532) Weight change:  Last BM Date: 01/26/12  Intake/Output from previous day: 06/18 0701 - 06/19 0700 In: -  Out: 40 [Urine:40] Intake/Output this shift:    General appearance: alert and cooperative Resp: clear to auscultation bilaterally Cardio: regular rate and rhythm, S1, S2 normal, no murmur, click, rub or gallop GI: soft, non-tender; bowel sounds normal; no masses,  no organomegaly Neurologic: Motor: strength 5-/5 both lower extremities  Lab Results:  Basename 01/27/12 0550 01/26/12 2225  WBC 8.5 9.1  HGB 12.6 12.9  HCT 38.4 38.7  PLT 272 286   BMET  Basename 01/27/12 0550 01/26/12 2225  NA 134* 134*  K 3.9 3.1*  CL 96 93*  CO2 29 32  GLUCOSE 141* 96  BUN 10 11  CREATININE 0.65 0.64  CALCIUM 9.3 9.4    Studies/Results: No results found.  Medications: I have reviewed the patient's current medications.  Assessment/Plan: Active Problems:  Severe Back/Radicular Pain/Lumbar DDD/Spinal Stenosis. Continue Transdermal Fentanyl (started yesterday), add lyrica, Morphine prn.  PT/OT.  Will ask Dr. Newell Coral to review her situation. May need ST-NHP, ask SW to see  Contstipation  Miralax and stool softeners  Diabetes mellitus hashad poor intake, some low blood sugas at home,NPH insulin dose reduced, SSI  UTI (lower urinary tract infection) rocephin 2, culture pending  HTN (hypertension) ok   LOS: 1 day   [name] [name] 01/27/2012, 7:16 AM

## 2012-01-27 NOTE — Progress Notes (Signed)
[facility] ED CM received voice message from ED SW that [facility]W had been added to pt orders by EDP. CM spoke with Darl Pikes, [facility] coordinator about the new order

## 2012-01-27 NOTE — Progress Notes (Signed)
ED CM/SW spoke with patient's daughter at [phone] (pt's [facility] room) Updated her that ED CM did receive ED SW message, [facility] staff, Darl Pikes had been contacted for [facility] on 01/27/12 and further services are pending from unit CM/SW and advanced home care staff prior to d/c Daughter voiced understanding, questions answered Daughter stated that "doctor is now in the room" and concluded the call

## 2012-01-27 NOTE — Consult Note (Signed)
Reason for Consult: Low back and bilateral lower extremity pain Referring Physician: Dr. Kirby Funk  [name] is an 78 y.o. female. She was seen in her hospital room, accompanied by her daughter Amy, who provided some history an additional information.  HPI:  Patient is a 78 year old, right-handed, white female who was admitted last night to [facility] with disabling low back and bilateral lower extremity pain. I had seen the patient in outpatient neurosurgical consultation on 12/15/2011. Her complaints were the same, that is low back and bilateral lower extremity pain. She has had a long history of mild low back discomfort for years, but somewhat increasing symptoms beginning nearly 4 months ago with pain in the back, which has subsequently extended into the right lower extremity initially, but now into the lower extremities bilaterally.   We performed a series of 2 L3-4 translaminar epidural steroid injections. The patient and her daughter described at most limited brief improvement following the injections, but no significant lasting improvement or relief.  Dr. Valentina Lucks, her primary physician, explains that most recently he has been treating her with OxyContin, up to 40 mg per day. The patient describes that she's had increasingly unbearable pain. Patient says that she can no longer bear weight because of the severity of the pain into her lower extremities with standing. She has been essentially bedridden for the past couple of weeks. She describes urinary incontinence, explaining that it is not that she can't get up to the bathroom, but rather she doesn't feel the urge to void and all of a sudden finds that she is wet, having voided on herself.  Patient presented to the emergency room last night with these complaints, and was admitted. Dr. Valentina Lucks called this morning asking that I will reconsult on the patient, and speak with the patient and her family. He did feel that  although she has diabetes, she does not have a medical contraindication to undergoing a major surgical procedure. Dr. Valentina Lucks explains that since admission he has started a fentanyl patch, Lyrica, and when necessary morphine.  Notably, she did undergo vaginal bladder surgery with Dr. Patsi Sears in early March and it seems to her that the symptoms worsened subsequently.   The patient describes that when the physical therapist came by today to work with her, that even slight movements of her lower extremities by the physical therapist caused disabling pain.   Past Medical History:  Past Medical History  Diagnosis Date  . Diabetes mellitus     greater than 30 years  . Hypertension   . Incontinence of urine   . Diarrhea   . Neuropathy     bilateral feet  . Arthritis     lower back/hands  . GERD (gastroesophageal reflux disease)     Past Surgical History:  Past Surgical History  Procedure Date  . Eye surgery     bil.cataract-unknown year  . Incontinence surgery 2005  . Tonsillectomy 1964  . Abdominal hysterectomy     1982  . Ovarian cyst surgery 1972  . Breast biopsy 1990  . Cholecystectomy 1988  . Cystoscopy 2009    macroplastique inj  . Rectocele repair 10/15/2011    Procedure: POSTERIOR REPAIR (RECTOCELE);  Surgeon: Kathi Ludwig, MD;  Location: [facility];  Service: Urology;;  posterior vaginal vault repair with sacral spinus repair with graft  . Cystoscopy 10/15/2011    Procedure: CYSTOSCOPY;  Surgeon: Kathi Ludwig, MD;  Location: [facility];  Service:  Urology;  Laterality: N/A;    Family History:  Family History  Problem Relation Age of Onset  . Osteoarthritis Sister   . Osteoarthritis Brother     Social History:  reports that she has never smoked. She does not have any smokeless tobacco history on file. She reports that she does not drink alcohol or use illicit drugs.  Allergies:  Allergies  Allergen Reactions  .  Neosporin (Neomycin-Bacitracin Zn-Polymyx) Rash  . Sulfa Antibiotics Rash    Medications: I have reviewed the patient's current medications.  Results for orders placed during the hospital encounter of 01/26/12 (from the past 48 hour(s))  CBC     Status: Normal   Collection Time   01/26/12 10:25 PM      Component Value Range Comment   WBC 9.1  4.0 - 10.5 K/uL    RBC 4.79  3.87 - 5.11 MIL/uL    Hemoglobin 12.9  12.0 - 15.0 g/dL    HCT 16.1  09.6 - 04.5 %    MCV 80.8  78.0 - 100.0 fL    MCH 26.9  26.0 - 34.0 pg    MCHC 33.3  30.0 - 36.0 g/dL    RDW 40.9  81.1 - 91.4 %    Platelets 286  150 - 400 K/uL   DIFFERENTIAL     Status: Normal   Collection Time   01/26/12 10:25 PM      Component Value Range Comment   Neutrophils Relative 73  43 - 77 %    Neutro Abs 6.6  1.7 - 7.7 K/uL    Lymphocytes Relative 17  12 - 46 %    Lymphs Abs 1.5  0.7 - 4.0 K/uL    Monocytes Relative 8  3 - 12 %    Monocytes Absolute 0.7  0.1 - 1.0 K/uL    Eosinophils Relative 2  0 - 5 %    Eosinophils Absolute 0.1  0.0 - 0.7 K/uL    Basophils Relative 0  0 - 1 %    Basophils Absolute 0.0  0.0 - 0.1 K/uL   BASIC METABOLIC PANEL     Status: Abnormal   Collection Time   01/26/12 10:25 PM      Component Value Range Comment   Sodium 134 (*) 135 - 145 mEq/L    Potassium 3.1 (*) 3.5 - 5.1 mEq/L    Chloride 93 (*) 96 - 112 mEq/L    CO2 32  19 - 32 mEq/L    Glucose, Bld 96  70 - 99 mg/dL    BUN 11  6 - 23 mg/dL    Creatinine, Ser 7.82  0.50 - 1.10 mg/dL    Calcium 9.4  8.4 - 95.6 mg/dL    GFR calc non Af Amer 85 (*) >90 mL/min    GFR calc Af Amer >90  >90 mL/min   URINALYSIS, ROUTINE W REFLEX MICROSCOPIC     Status: Abnormal   Collection Time   01/26/12 11:30 PM      Component Value Range Comment   Color, Urine YELLOW  YELLOW    APPearance CLOUDY (*) CLEAR    Specific Gravity, Urine 1.015  1.005 - 1.030    pH 7.5  5.0 - 8.0    Glucose, UA NEGATIVE  NEGATIVE mg/dL    Hgb urine dipstick NEGATIVE  NEGATIVE     Bilirubin Urine NEGATIVE  NEGATIVE    Ketones, ur NEGATIVE  NEGATIVE mg/dL    Protein, ur NEGATIVE  NEGATIVE mg/dL    Urobilinogen, UA 0.2  0.0 - 1.0 mg/dL    Nitrite NEGATIVE  NEGATIVE    Leukocytes, UA LARGE (*) NEGATIVE   URINE MICROSCOPIC-ADD ON     Status: Abnormal   Collection Time   01/26/12 11:30 PM      Component Value Range Comment   WBC, UA TOO NUMEROUS TO COUNT  <3 WBC/hpf WITH CLUMPS   Bacteria, UA FEW (*) RARE   COMPREHENSIVE METABOLIC PANEL     Status: Abnormal   Collection Time   01/27/12  5:50 AM      Component Value Range Comment   Sodium 134 (*) 135 - 145 mEq/L    Potassium 3.9  3.5 - 5.1 mEq/L    Chloride 96  96 - 112 mEq/L    CO2 29  19 - 32 mEq/L    Glucose, Bld 141 (*) 70 - 99 mg/dL    BUN 10  6 - 23 mg/dL    Creatinine, Ser 4.78  0.50 - 1.10 mg/dL    Calcium 9.3  8.4 - 29.5 mg/dL    Total Protein 6.0  6.0 - 8.3 g/dL    Albumin 2.8 (*) 3.5 - 5.2 g/dL    AST 13  0 - 37 U/L    ALT 14  0 - 35 U/L    Alkaline Phosphatase 91  39 - 117 U/L    Total Bilirubin 0.4  0.3 - 1.2 mg/dL    GFR calc non Af Amer 84 (*) >90 mL/min    GFR calc Af Amer >90  >90 mL/min   CBC     Status: Normal   Collection Time   01/27/12  5:50 AM      Component Value Range Comment   WBC 8.5  4.0 - 10.5 K/uL    RBC 4.66  3.87 - 5.11 MIL/uL    Hemoglobin 12.6  12.0 - 15.0 g/dL    HCT 62.1  30.8 - 65.7 %    MCV 82.4  78.0 - 100.0 fL    MCH 27.0  26.0 - 34.0 pg    MCHC 32.8  30.0 - 36.0 g/dL    RDW 84.6  96.2 - 95.2 %    Platelets 272  150 - 400 K/uL   GLUCOSE, CAPILLARY     Status: Abnormal   Collection Time   01/27/12  7:29 AM      Component Value Range Comment   Glucose-Capillary 121 (*) 70 - 99 mg/dL   GLUCOSE, CAPILLARY     Status: Abnormal   Collection Time   01/27/12 12:04 PM      Component Value Range Comment   Glucose-Capillary 214 (*) 70 - 99 mg/dL   GLUCOSE, CAPILLARY     Status: Abnormal   Collection Time   01/27/12  4:50 PM      Component Value Range Comment    Glucose-Capillary 261 (*) 70 - 99 mg/dL    Comment 1 Notify RN      Review of systems: Notable for those difficulties described in history of present illness and past medical history, but is otherwise unremarkable.   Physical examination: Patient is an elderly white female, resting comfortably in bed, however with even minimal movement of the lower extremities she complains of severe pain into the lower extremities.  Blood pressure 125/56, pulse 87, temperature 97.8 F (36.6 C), temperature source Oral, resp. rate 16, SpO2 97.00%.  Strength seems to be full through the lower extremities including  the iliopsoas, quadriceps, dorsiflexor, extensor hallucis longus, and plantar flexor bilaterally.  Sensation is intact to pinprick through the distal lower extremities. Reflexes are 1 at the quadriceps, minimal at the gastrocnemii; they are symmetrical bilaterally and the toes are downgoing bilaterally.  Gait and stance were not tested due to the severity of her pain.    Diagnostic studies: I reviewed again her x-rays, CT scan, and MRI scan of the lumbar spine. She has an old asymmetric L5 compression fracture with loss of height on the left side, which contributes significantly to a degenerative scoliosis. There is a symmetric loss of disc space height on the right side at L3-4, to compensate for the L5 vertebral body deformity. X-rays show a dynamic grade 1 anterolisthesis of L4-5, that increases with flexion, and decreases with extension. MRI scan reconfirms the degenerative changes at the L3-4 and L4-5 levels.  We see moderate to marked multifactorial stenosis at L3-4 and severe stenosis at L4-5, worse on the left than the right.    Assessment/Plan: Patient with disabling neurogenic claudication secondary to multilevel, multifactorial lumbar stenosis (moderate to marked at L3-4 and severe at L4-5) that has left her essentially bed ridden and incontinence of bladder. Unfortunately she had little response  to a series of 2 epidural steroid injections, but also has had poor pain control as an outpatient.   Options for further treatment and care at this time range from continued symptomatic care with medications, it is unlikely that she would be able to manage at home, and would likely require care in a skilled nursing facility. The alternative would be a major surgical intervention requiring an L3-L5 decompressive lumbar laminectomy, a 2 level L3-4 and L4-5 posterior lumbar interbody arthrodesis with interbody implants and bone graft, and an L3-L5 posterior lateral arthrodesis with postreduction patient and bone graft. I've discussed this with the patient at the time of my previous consultation, when she was accompanied by her son, as well as again today with the patient and her daughter Amy who was present.  We discussed the nature of her condition and the extent of the surgical procedure.  We discussed the typical length of surgery, hospital stay, and overall recuperation, her limitations postoperatively, and the risks of surgery including the risks of infection, bleeding, possible need for transfusion, the risk of nerve dysfunction with pain, weakness, numbness or paresthesias, the risks of dural tear and CSF leakage, and possible need for further surgery, the risk of failure of the arthrodesis and possible need for further surgery, and anesthetic risks of myocardial infarction, stroke, pneumonia, and death.  We discussed the uncertainty of symptomatic improvement following surgery, including the uncertainty of pain relief, the uncertainty of improved mobility, and the uncertainty of improved bladder control. We discussed the extent of patient effort that will be required during postoperative rehabilitation, and the likelihood of failure of recovery, and the need for skilled nursing facility placement, if there is insufficient effort on the patient's part during rehabilitation and recuperation.   I had the  opportunity to answer the questions of both the patient and her daughter, Amy. At this point they want to consider the information I provided to them, they want to wait for the patient's son to return from out of town and at this point they plan on continuing to discuss the patient's options further with Dr. Valentina Lucks, her primary physician. I will plan on following up on a when necessary basis, and certainly Dr. Valentina Lucks can be in touch with me as  needed.    Hewitt Shorts, MD 01/27/2012, 6:03 PM

## 2012-01-27 NOTE — Evaluation (Signed)
Occupational Therapy Evaluation Patient Details Name: [name] MRN: [id number] DOB: 24-May-1933 Today's Date: 01/27/2012 Time: 0825- 0843  18 minutes    OT Assessment / Plan / Recommendation Clinical Impression  This 78 year old female with spinal stenosis and L5 compression fx was admitted with increased pain.  She states she has been in the bed for about a week.  Her daughter came to stay with her.  Pt is very limited with LB ADLs due to back pain.  Unable to transfer OOB and tolerated EOB for only seconds.  She needs A x 2 for ADLs.  Will trial OT to see if she will benefit.  Educated on back precautions today, and she needs reinforcement with these    OT Assessment  Patient needs continued OT Services    Follow Up Recommendations  Skilled nursing facility    Barriers to Discharge      Equipment Recommendations  Defer to next venue    Recommendations for Other Services    Frequency  Min 1X/week    Precautions / Restrictions Precautions Precautions: Back Restrictions Weight Bearing Restrictions: No   Pertinent Vitals/Pain Pain 10/10 with movement.  Only settled to 9 1/2 with repositioning.  Had IV morphine prior to therapy    ADL  Eating/Feeding: Simulated;Independent Where Assessed - Eating/Feeding: Bed level Grooming: Simulated;Set up Where Assessed - Grooming: Supine, head of bed up Upper Body Bathing: Simulated;Set up Where Assessed - Upper Body Bathing: Supine, head of bed up Lower Body Bathing: Simulated;+2 Total assistance Lower Body Bathing: Patient Percentage: 0% Where Assessed - Lower Body Bathing: Rolling right and/or left Upper Body Dressing: Simulated;Minimal assistance Where Assessed - Upper Body Dressing: Supine, head of bed up Lower Body Dressing: Simulated;+2 Total assistance Lower Body Dressing: Patient Percentage: 0% Where Assessed - Lower Body Dressing: Rolling right and/or left Toileting - Clothing Manipulation and Hygiene: Simulated;+2 Total  assistance Toileting - Clothing Manipulation and Hygiene: Patient Percentage: 0% Where Assessed - Toileting Clothing Manipulation and Hygiene: Rolling right and/or left Transfers/Ambulation Related to ADLs: coeval with PT.  Sat EOB seconds--pain too great.  Therapists did most of bed mobility to decrease strain on pt (Pt 10%) ADL Comments: HOB about 30 degrees for ADLs    OT Diagnosis: Generalized weakness  OT Problem List: Pain;Decreased strength;Decreased activity tolerance;Decreased knowledge of precautions OT Treatment Interventions: Self-care/ADL training;Patient/family education;Therapeutic activities;DME and/or AE instruction   OT Goals Acute Rehab OT Goals OT Goal Formulation: With patient Time For Goal Achievement: 02/10/12 Potential to Achieve Goals: Fair Miscellaneous OT Goals Miscellaneous OT Goal #1: Pt will roll to bil sides, using log roll with mod A for ADLs OT Goal: Miscellaneous Goal #1 - Progress: Goal set today Miscellaneous OT Goal #2: Pt will state 3/3 back precautions OT Goal: Miscellaneous Goal #2 - Progress: Goal set today Miscellaneous OT Goal #3: Pt will tolerate sitting eob with A x 2 to achieve this and maintain with min guard for 3 minutes in prep for adls OT Goal: Miscellaneous Goal #3 - Progress: Goal set today  Visit Information  Last OT Received On: 01/27/12 Assistance Needed: +2 PT/OT Co-Evaluation/Treatment: Yes    Subjective Data  Subjective: I haven't been able to get out of bed for about a week.  It hurts so much   Prior Functioning  Home Living Lives With: Spouse;Other (Comment) (daughter has been staying with her  husb with CVA) Type of Home:  (condo) Home Access: Level entry Home Layout: One level Bathroom Shower/Tub: Tub/shower unit (  sponge bathes since 3/7) Bathroom Toilet: Handicapped height (grab bars) Home Adaptive Equipment: Dan Humphreys - four wheeled;Bedside commode/3-in-1;Wheelchair - manual Prior Function Level of Independence:  Needs assistance Vocation: Retired Musician: No difficulties    Cognition  Overall Cognitive Status: Appears within functional limits for tasks assessed/performed Arousal/Alertness: Awake/alert Orientation Level: Appears intact for tasks assessed Behavior During Session: [facility] for tasks performed    Extremity/Trunk Assessment Right Upper Extremity Assessment RUE ROM/Strength/Tone: [facility] for tasks assessed (checked to 90 degrees) Left Upper Extremity Assessment LUE ROM/Strength/Tone: WFL for tasks assessed Right Lower Extremity Assessment RLE ROM/Strength/Tone: Due to pain;Deficits;Unable to fully assess RLE ROM/Strength/Tone Deficits: ankle DF at least 3/5; hip/knee not assessed due to 10/10 pain with movement RLE Sensation: History of peripheral neuropathy Left Lower Extremity Assessment LLE ROM/Strength/Tone: Due to pain;Deficits;Unable to fully assess LLE ROM/Strength/Tone Deficits: ankle DF at least 3/5; hip/knee not assessed due to 10/10 pain with movement LLE Sensation: History of peripheral neuropathy   Mobility Bed Mobility Bed Mobility: Rolling Right;Right Sidelying to Sit Rolling Right: 1: +2 Total assist Rolling Right: Patient Percentage: 10% Right Sidelying to Sit: 1: +2 Total assist;HOB flat Right Sidelying to Sit: Patient Percentage: 10% Details for Bed Mobility Assistance: severe 10/10 BLE pain (from low back to feet) with rolling and attempted sidelying to sit with +2 assist, pt unable to achieve full sitting position due to pain, returned pt to supine.  Instructed pt in log roll technique to minimize strain to LB. Transfers Details for Transfer Assistance: NT- unable due to pain   Exercise    Balance    End of Session OT - End of Session Activity Tolerance: Patient limited by pain Patient left: in bed;with call bell/phone within reach   [facility] 01/27/2012, 9:30 AM Marica Otter, OTR/L [phone] 01/27/2012

## 2012-01-27 NOTE — Evaluation (Signed)
Physical Therapy Evaluation Patient Details Name: [name] MRN: [id number] DOB: [birth year] Today's Date: 01/27/2012 Time: 1308-6578 PT Time Calculation (min): 25 min  PT Assessment / Plan / Recommendation Clinical Impression  78 y.o. female admitted with severe back pain. Pt has h/o spinal stenosis and L5 compression fx. Pt unable to tolerate sidelying to sit with assist of 2 due to severe 10/10 BLE pain. SNF recommended. Will follow and attempt progression of activity as pt tolerates.     PT Assessment  Patient needs continued PT services    Follow Up Recommendations  Skilled nursing facility    Barriers to Discharge Decreased caregiver support husband had CVA, daughter works, likely will need SNF    lEquipment Recommendations  Defer to next venue    Recommendations for Other Services OT consult   Frequency Min 2X/week    Precautions / Restrictions Precautions Precautions: Back Restrictions Weight Bearing Restrictions: No   Pertinent Vitals/Pain **10/10 BLE pain with movement Pt premedicated, repositioned*      Mobility  Bed Mobility Bed Mobility: Rolling Right;Right Sidelying to Sit Rolling Right: 1: +2 Total assist Rolling Right: Patient Percentage: 10% Right Sidelying to Sit: 1: +2 Total assist;HOB flat Right Sidelying to Sit: Patient Percentage: 10% Details for Bed Mobility Assistance: severe 10/10 BLE pain (from low back to feet) with rolling and attempted sidelying to sit with +2 assist, pt unable to achieve full sitting position due to pain, returned pt to supine.  Instructed pt in log roll technique to minimize strain to LB. Transfers Details for Transfer Assistance: NT- unable due to pain Ambulation/Gait Ambulation/Gait Assistance Details: NT-unable due to pain    Exercises     PT Diagnosis: Generalized weakness;Acute pain  PT Problem List: Decreased activity tolerance;Pain;Decreased mobility PT Treatment Interventions: Functional mobility  training;Therapeutic activities;Patient/family education   PT Goals Acute Rehab PT Goals PT Goal Formulation: With patient Time For Goal Achievement: 02/10/12 Potential to Achieve Goals: Fair Pt will Roll Supine to Right Side: with max assist PT Goal: Rolling Supine to Right Side - Progress: Goal set today Pt will go Supine/Side to Sit: with max assist PT Goal: Supine/Side to Sit - Progress: Goal set today Pt will go Stand to Sit: with max assist PT Goal: Stand to Sit - Progress: Goal set today Pt will Transfer Bed to Chair/Chair to Bed: with max assist PT Transfer Goal: Bed to Chair/Chair to Bed - Progress: Goal set today  Visit Information  Last PT Received On: 01/27/12 Assistance Needed: +2 PT/OT Co-Evaluation/Treatment: Yes    Subjective Data  Subjective: I hate to be this way. (in so much pain) Patient Stated Goal: decrease pain   Prior Functioning  Home Living Lives With: Spouse;Other (Comment) (daughter has been staying with her  husb with CVA) Type of Home:  (condo) Home Access: Level entry Home Layout: One level Bathroom Shower/Tub: Tub/shower unit (sponge bathes since 3/7) Bathroom Toilet: Handicapped height (grab bars) Home Adaptive Equipment: Environmental consultant - four wheeled;Bedside commode/3-in-1;Wheelchair - manual Prior Function Level of Independence: Needs assistance Vocation: Retired Musician: No difficulties    Cognition  Overall Cognitive Status: Appears within functional limits for tasks assessed/performed Arousal/Alertness: Awake/alert Orientation Level: Appears intact for tasks assessed Behavior During Session: [facility] for tasks performed    Extremity/Trunk Assessment Right Lower Extremity Assessment RLE ROM/Strength/Tone: Due to pain;Deficits;Unable to fully assess RLE ROM/Strength/Tone Deficits: ankle DF at least 3/5; hip/knee not assessed due to 10/10 pain with movement RLE Sensation: History of peripheral neuropathy Left Lower Extremity  Assessment LLE ROM/Strength/Tone: Due to pain;Deficits;Unable to fully assess LLE ROM/Strength/Tone Deficits: ankle DF at least 3/5; hip/knee not assessed due to 10/10 pain with movement LLE Sensation: History of peripheral neuropathy   Balance    End of Session PT - End of Session Activity Tolerance: Patient limited by pain Patient left: in bed;with call bell/phone within reach Nurse Communication: Mobility status   Ralene Bathe Kistler 01/27/2012, 9:01 AM [phone]

## 2012-01-27 NOTE — Progress Notes (Signed)
INITIAL ADULT NUTRITION ASSESSMENT Date: 01/27/2012   Time: 4:22 PM Reason for Assessment: Nutrition risk  ASSESSMENT: Female 78 y.o.  Dx: Severe low back pain  Food/Nutrition Related Hx: Met with pt and sister who report pt has eaten "like a bird" for a long time. Pt has notebook at bedside that family has charted pt's PO intake and blood sugars at home, which have been sometimes low, sometimes high. Pt's intake at home consists mainly of nutty buddy bars, sandwiches, and yogurt, no consistent meal intake, mainly snacking on food throughout the day. Pt also reports drinking Ensure on/off at home. Pt reports 10 pound unintended weight loss in the past few months, however pt's weight has gone up 8 pounds from March to May this year and no weight yet recorded for this admission. Pt wears upper set of dentures and consumes mostly soft foods at home. Pt c/o of dysphagia r/t reflux which family states is chronic and causes choking episodes. Noted in H&P MD had stated an SLP consult was ordered, however have not seen the order - recommend MD consult SLP. Pt c/o lack of BM in the past 2-3 days and Miralax and stool softeners, however BM charted yesterday. Recommend changing diet back to diabetic diet as pt feels restricted in her food choices on heart healthy diet and pt's intake typically poor. Pt ate 50% of lunch. Recommend RN get accurate weight on pt for current admission.   Hx: Past Medical History  Diagnosis Date  . Diabetes mellitus     greater than 30 years  . Hypertension   . Incontinence of urine   . Diarrhea   . Neuropathy     bilateral feet  . Arthritis     lower back/hands  . GERD (gastroesophageal reflux disease)    Related Meds:  Scheduled Meds:   . sodium chloride   Intravenous STAT  . cefTRIAXone (ROCEPHIN)  IV  1 g Intravenous QHS  . ciprofloxacin  500 mg Oral Once  . docusate sodium  100 mg Oral BID  . fentaNYL  50 mcg Transdermal Q72H  . insulin aspart  0-9 Units  Subcutaneous TID WC  . insulin NPH  20 Units Subcutaneous QAC supper  . insulin NPH  30 Units Subcutaneous QAC breakfast  .  morphine injection  4 mg Intravenous Once  .  morphine injection  6 mg Intravenous Once  . polyethylene glycol  17 g Oral Daily  . potassium chloride SA  20 mEq Oral Daily  . potassium chloride  40 mEq Oral Once  . pregabalin  50 mg Oral BID  . sodium chloride  3 mL Intravenous Q12H  . triamterene-hydrochlorothiazide  1 tablet Oral Daily  . DISCONTD: insulin NPH  10 Units Subcutaneous BID WC   Continuous Infusions:  PRN Meds:.acetaminophen, acetaminophen, LORazepam, morphine injection, ondansetron (ZOFRAN) IV, ondansetron, traMADol  Ht:  5' 4'' (162.6 cm)  Wt:  79.9 kg (176 lb 3.2 oz) from 12/12/11  Ideal Wt:    120 lb % Ideal Wt: 147  Usual Wt: 186 lb % Usual Wt: 95  Wt Readings from Last 10 Encounters:  12/12/11 176 lb 3.2 oz (79.924 kg)  10/09/11 168 lb (76.204 kg)  10/09/11 168 lb (76.204 kg)   Body mass index of 30.2 kg / (m^2) based on weight from May 2013.    Labs:  CMP     Component Value Date/Time   NA 134* 01/27/2012 0550   K 3.9 01/27/2012 0550   CL 96  01/27/2012 0550   CO2 29 01/27/2012 0550   GLUCOSE 141* 01/27/2012 0550   BUN 10 01/27/2012 0550   CREATININE 0.65 01/27/2012 0550   CALCIUM 9.3 01/27/2012 0550   PROT 6.0 01/27/2012 0550   ALBUMIN 2.8* 01/27/2012 0550   AST 13 01/27/2012 0550   ALT 14 01/27/2012 0550   ALKPHOS 91 01/27/2012 0550   BILITOT 0.4 01/27/2012 0550   GFRNONAA 84* 01/27/2012 0550   GFRAA >90 01/27/2012 0550   No results found for this basename: HGBA1C   CBG (last 3)   Basename 01/27/12 1204 01/27/12 0729  GLUCAP 214* 121*    Intake/Output Summary (Last 24 hours) at 01/27/12 1636 Last data filed at 01/27/12 1300  Gross per 24 hour  Intake    808 ml  Output     40 ml  Net    768 ml   Last BM - 01/26/12   Diet Order: Cardiac   IVF:    Estimated Nutritional Needs:   Kcal: 1610-9604 Protein:  65-85g Fluid: 1.6-1.9L  NUTRITION DIAGNOSIS: -Inadequate oral intake (NI-2.1).  Status: Ongoing  RELATED TO: chronic poor appetite and dysphagia  AS EVIDENCE BY: reported weight loss and small portion intake at home  MONITORING/EVALUATION(Goals): Pt able to safely consume >75% of meals/supplements  EDUCATION NEEDS: -No education needs identified at this time  INTERVENTION: Ensure Complete BID. Awaiting SLP evaluation. Will monitor.   Dietitian #: [phone]  DOCUMENTATION CODES Per approved criteria  -Obesity Unspecified    Marshall Cork 01/27/2012, 4:22 PM

## 2012-01-27 NOTE — Progress Notes (Signed)
ED CSW was contacted by pt's daughter, Orlie Dakin, to inquire about SNF placement. ED CSW explained the process and encouraged her to follow up with the unit Inpatient Social Worker on 01/28/12. ED CSW will leave information for the Inpatient CSW that family needs assistance in SNF placement.

## 2012-01-27 NOTE — Progress Notes (Signed)
MD, Patient's family are requesting order for inpatient social work consult for SNF placement.  Please follow up with family regarding this.  Thanks.Kenton Kingfisher [place]

## 2012-01-28 LAB — GLUCOSE, CAPILLARY: Glucose-Capillary: 169 mg/dL — ABNORMAL HIGH (ref 70–99)

## 2012-01-28 LAB — BASIC METABOLIC PANEL
CO2: 30 mEq/L (ref 19–32)
Calcium: 9.5 mg/dL (ref 8.4–10.5)
Creatinine, Ser: 0.66 mg/dL (ref 0.50–1.10)
GFR calc non Af Amer: 84 mL/min — ABNORMAL LOW (ref >90)
Glucose, Bld: 95 mg/dL (ref 70–99)
Sodium: 137 mEq/L (ref 135–145)

## 2012-01-28 MED ORDER — VITAMINS A & D EX OINT
TOPICAL_OINTMENT | CUTANEOUS | Status: AC
  Administered 2012-01-28: 08:00:00
  Filled 2012-01-28: qty 5

## 2012-01-28 MED ORDER — FENTANYL 75 MCG/HR TD PT72: 75.0000 ug | MEDICATED_PATCH | TRANSDERMAL | Status: DC

## 2012-01-28 MED ORDER — PREGABALIN 75 MG PO CAPS
75.0000 mg | ORAL_CAPSULE | Freq: Two times a day (BID) | ORAL | Status: DC
  Administered 2012-01-28 – 2012-01-29 (×4): 75 mg via ORAL
  Filled 2012-01-28 (×4): qty 1

## 2012-01-28 MED ORDER — SENNOSIDES-DOCUSATE SODIUM 8.6-50 MG PO TABS
2.0000 | ORAL_TABLET | Freq: Every day | ORAL | Status: DC
  Administered 2012-01-28 – 2012-01-29 (×2): 2 via ORAL
  Filled 2012-01-28 (×4): qty 2

## 2012-01-28 MED ORDER — MORPHINE SULFATE 4 MG/ML IJ SOLN
4.0000 mg | INTRAMUSCULAR | Status: DC | PRN
  Administered 2012-01-28 – 2012-01-29 (×6): 4 mg via INTRAVENOUS
  Filled 2012-01-28 (×6): qty 1

## 2012-01-28 MED ORDER — FENTANYL 75 MCG/HR TD PT72
75.0000 ug | MEDICATED_PATCH | TRANSDERMAL | Status: DC
  Administered 2012-01-28: 75 ug via TRANSDERMAL
  Filled 2012-01-28: qty 1

## 2012-01-28 MED ORDER — VITAMINS A & D EX OINT
TOPICAL_OINTMENT | CUTANEOUS | Status: AC
  Administered 2012-01-28: 14:00:00
  Filled 2012-01-28: qty 5

## 2012-01-28 NOTE — Progress Notes (Signed)
Inpatient Diabetes Program Recommendations  AACE/ADA: New Consensus Statement on Inpatient Glycemic Control (2009)  Target Ranges:  Prepandial:   less than 140 mg/dL      Peak postprandial:   less than 180 mg/dL (1-2 hours)      Critically ill patients:  140 - 180 mg/dL   Reason for Visit: Hyperglycemia  Results for DOREATHA, OFFER (MRN [id number]) as of 01/28/2012 13:52  Ref. Range 01/27/2012 12:04 01/27/2012 16:50 01/27/2012 22:21 01/28/2012 07:50 01/28/2012 12:07  Glucose-Capillary Latest Range: 70-99 mg/dL 119 (H) 147 (H) 829 (H) 121 (H) 251 (H)  Results for VERNADINE, COOMBS (MRN [id number]) as of 01/28/2012 13:52  Ref. Range 01/28/2012 03:30  Sodium Latest Range: 135-145 mEq/L 137  Potassium Latest Range: 3.5-5.1 mEq/L 3.7  Chloride Latest Range: 96-112 mEq/L 98  CO2 Latest Range: 19-32 mEq/L 30  BUN Latest Range: 6-23 mg/dL 12  Creat Latest Range: 0.50-1.10 mg/dL 7.84  Calcium Latest Range: 8.4-10.5 mg/dL 9.5  GFR calc non Af Amer Latest Range: >90 mL/min 84 (L)  GFR calc Af Amer Latest Range: >90 mL/min >90  Glucose Latest Range: 70-99 mg/dL 95    Inpatient Diabetes Program Recommendations Insulin - Meal Coverage: Add Novolog 3 units tidwc if pt eats > 50% meals HgbA1C: Check HgbA1C to assess glycemic control prior to hospitalization  Note: Pt not eating well at present.  States she doesn't have an appetite.  Drinking Ensure supplement.  Will follow.

## 2012-01-28 NOTE — Progress Notes (Signed)
Subjective: No BM.  Pain about the same.  Objective: Vital signs in last 24 hours: Temp:  [97.8 F (36.6 C)-98.7 F (37.1 C)] 98.7 F (37.1 C) (06/20 0719) Pulse Rate:  [80-107] 107  (06/20 0719) Resp:  [16-20] 20  (06/20 0719) BP: (125-164)/(56-92) 164/92 mmHg (06/20 0719) SpO2:  [92 %-97 %] 92 % (06/20 0719) Weight:  [69.8 kg (153 lb 14.1 oz)] 69.8 kg (153 lb 14.1 oz) (06/20 0719) Weight change:  Last BM Date: 01/26/12  Intake/Output from previous day: 06/19 0701 - 06/20 0700 In: 600 [P.O.:600] Out: -  Intake/Output this shift:    General appearance: alert and cooperative Resp: clear to auscultation bilaterally Cardio: regular rate and rhythm, S1, S2 normal, no murmur, click, rub or gallop  Lab Results:  Basename 01/27/12 0550 01/26/12 2225  WBC 8.5 9.1  HGB 12.6 12.9  HCT 38.4 38.7  PLT 272 286   BMET  Basename 01/28/12 0330 01/27/12 0550  NA 137 134*  K 3.7 3.9  CL 98 96  CO2 30 29  GLUCOSE 95 141*  BUN 12 10  CREATININE 0.66 0.65  CALCIUM 9.5 9.3    Studies/Results: No results found.  Medications: I have reviewed the patient's current medications.  Assessment/Plan: Active Problems:  Severe Back/Radicular Pain/Lumbar DDD/Spinal Stenosis. Increase Transdermal Fentanyl (started yesterday), continue lyrica, Morphine prn. PT/OT. Appreciate Dr. Newell Coral input. Will need ST-[facility], Ask pain management to see if availabe Constipation Miralax and senekot Diabetes mellitus fair control ,NPH insulin dose reduced, SSI, follow UTI (lower urinary tract infection) rocephin 3, culture enterococcus, sens pending HTN (hypertension) ok    LOS: 2 days    Heyli Min JOSEPH 01/28/2012, 7:47 AM

## 2012-01-28 NOTE — Progress Notes (Signed)
This NP meet at the bedside with the patient and her husband to discuss pain management strategy.  They requested that I speak with their son Yitta Gongaware  # [phone] and schedule a time that he can be present.  A scheduled meeting is for tomorrow morning at 0800.  Iniitial conversation regarding pain managemnt approaches presented.  Dr Valentina Lucks notified of above.  Lorinda Creed NP  [phone]

## 2012-01-28 NOTE — Progress Notes (Signed)
Spouse was in room also.  Patient was in pain at time of visit, but nurse had recently brought her something. She asked for prayer for pain to go away and for her to feel better. Spouse is a Education officer, environmental and retired Electronics engineer.  He told me that they recently had their 64 year wedding anniversary. He was sad during our visit. It is obvious that his wife's health is weighing heavily on him.  Will continue to support.  01/28/12 1500  Clinical Encounter Type  Visited With Patient and family together  Visit Type Spiritual support;Social support  Referral From Palliative care team  Recommendations Follow up  Spiritual Encounters  Spiritual Needs Prayer;Emotional

## 2012-01-29 DIAGNOSIS — M545 Low back pain: Secondary | ICD-10-CM

## 2012-01-29 DIAGNOSIS — M6281 Muscle weakness (generalized): Secondary | ICD-10-CM

## 2012-01-29 LAB — GLUCOSE, CAPILLARY
Glucose-Capillary: 213 mg/dL — ABNORMAL HIGH (ref 70–99)
Glucose-Capillary: 244 mg/dL — ABNORMAL HIGH (ref 70–99)

## 2012-01-29 LAB — URINE CULTURE: Culture  Setup Time: 201306190521

## 2012-01-29 MED ORDER — DIAZEPAM 5 MG PO TABS
5.0000 mg | ORAL_TABLET | Freq: Three times a day (TID) | ORAL | Status: DC
  Filled 2012-01-29: qty 1

## 2012-01-29 MED ORDER — MORPHINE SULFATE 15 MG PO TABS: 20.0000 mg | ORAL_TABLET | ORAL | Status: DC | PRN

## 2012-01-29 MED ORDER — BISACODYL 5 MG PO TBEC: 5.0000 mg | DELAYED_RELEASE_TABLET | ORAL | Status: DC

## 2012-01-29 MED ORDER — MORPHINE SULFATE (CONCENTRATE) 20 MG/ML PO SOLN
20.0000 mg | ORAL | Status: DC | PRN
  Filled 2012-01-29: qty 1

## 2012-01-29 MED ORDER — ENOXAPARIN SODIUM 40 MG/0.4ML ~~LOC~~ SOLN
40.0000 mg | SUBCUTANEOUS | Status: DC
  Administered 2012-01-29 – 2012-02-08 (×11): 40 mg via SUBCUTANEOUS
  Filled 2012-01-29 (×11): qty 0.4

## 2012-01-29 MED ORDER — BISACODYL 10 MG RE SUPP
10.0000 mg | RECTAL | Status: AC
  Administered 2012-01-29: 10 mg via RECTAL
  Filled 2012-01-29: qty 1

## 2012-01-29 MED ORDER — ACETAMINOPHEN 325 MG PO TABS
650.0000 mg | ORAL_TABLET | Freq: Four times a day (QID) | ORAL | Status: DC
  Administered 2012-01-29 – 2012-01-30 (×5): 650 mg via ORAL
  Filled 2012-01-29 (×12): qty 2

## 2012-01-29 MED ORDER — MORPHINE SULFATE ER 100 MG PO TBCR
100.0000 mg | EXTENDED_RELEASE_TABLET | Freq: Two times a day (BID) | ORAL | Status: DC
  Administered 2012-01-29 (×2): 100 mg via ORAL
  Filled 2012-01-29 (×2): qty 1

## 2012-01-29 MED ORDER — AMOXICILLIN 500 MG PO CAPS
500.0000 mg | ORAL_CAPSULE | Freq: Three times a day (TID) | ORAL | Status: DC
  Administered 2012-01-29 – 2012-01-30 (×3): 500 mg via ORAL
  Filled 2012-01-29 (×7): qty 1

## 2012-01-29 NOTE — Progress Notes (Signed)
Patient frequently observed having difficulty swallowing liquids - gagging or choking produced.  Pills are given in applesauce, and the patient did have difficulty with swallowing the large amoxicillin capsule prior to adjustment of her pain regime (which enabled her to sit up with head elevated more comfortably).  Will continue to monitor.

## 2012-01-29 NOTE — Consult Note (Signed)
Urology Consult   Physician requesting consult: Valentina Lucks  Reason for consult: Incontinence  History of Present Illness: [name] is a 78 y.o. with PMH significant for DM, HTN, neuropathy, incontinence, and spinal stenosis who was recently admitted for pain control due to severe back and LE pain.  She has a known L5 compression fracture as well as spinal stenosis.  Any movement is extremely painful for her.  She is receiving multiple pain medications.    She also has incontinence which has been present for a long period of time.  She is s/p posterior vaginal vault repair with graft in 3/13 by Dr. Patsi Sears.  After the procedure her incontinence improved slightly but then progressed again.  She states she has continued to wear pads and Depends daily changing the pads approx 8-10 times per day.  She did not have improvement in sx with Vesicare.  In office PVRs were of small volume.  Pt states that she rarely has the urge to void but as soon as she does she immediately leaks urine.  She denies stress incontinence and states "it just leaks anytime".  She denies dysuria, hematuria, and flank pain.  After admission pt was found to have an Enteroccoccus UTI sensitive to ampicillin and Levaquin. She is receiving amoxicillin.  She is not currently being treated with an anticholinergic.    Due to her pain issues and limited mobility, the family is currently seeking placement for the pt at a SNF.  The pain management team is also working with the pt.  At this time the pt denies CP, SOB, N/V, diarrhea, and abdominal pain.  She does c/o back pain and LE pain and numbness.   Past Medical History  Diagnosis Date  . Diabetes mellitus     greater than 30 years  . Hypertension   . Incontinence of urine   . Diarrhea   . Neuropathy     bilateral feet  . Arthritis     lower back/hands  . GERD (gastroesophageal reflux disease)   constipation Spinal stenosis L5 compression fracture UTI  Past Surgical  History  Procedure Date  . Eye surgery     bil.cataract-unknown year  . Incontinence surgery 2005  . Tonsillectomy 1964  . Abdominal hysterectomy     1982  . Ovarian cyst surgery 1972  . Breast biopsy 1990  . Cholecystectomy 1988  . Cystoscopy 2009    macroplastique inj  . Rectocele repair 10/15/2011    Procedure: POSTERIOR REPAIR (RECTOCELE);  Surgeon: Kathi Ludwig, MD;  Location: [facility];  Service: Urology;;  posterior vaginal vault repair with sacral spinus repair with graft  . Cystoscopy 10/15/2011    Procedure: CYSTOSCOPY;  Surgeon: Kathi Ludwig, MD;  Location: [facility];  Service: Urology;  Laterality: N/A;     Current Hospital Medications: Scheduled Meds:   . acetaminophen  650 mg Oral Q6H  . amoxicillin  500 mg Oral Q8H  . bisacodyl  10 mg Rectal NOW  . diazepam  5 mg Oral Q8H  . enoxaparin (LOVENOX) injection  40 mg Subcutaneous Q24H  . feeding supplement  237 mL Oral BID BM  . insulin aspart  0-9 Units Subcutaneous TID WC  . insulin NPH  20 Units Subcutaneous QAC supper  . insulin NPH  30 Units Subcutaneous QAC breakfast  . morphine  100 mg Oral Q12H  . polyethylene glycol  17 g Oral Daily  . potassium chloride SA  20 mEq Oral Daily  .  pregabalin  75 mg Oral BID  . senna-docusate  2 tablet Oral QHS  . sodium chloride  3 mL Intravenous Q12H  . triamterene-hydrochlorothiazide  1 tablet Oral Daily  . vitamin A & D      . DISCONTD: bisacodyl  5 mg Oral NOW  . DISCONTD: cefTRIAXone (ROCEPHIN)  IV  1 g Intravenous QHS  . DISCONTD: fentaNYL  75 mcg Transdermal Q72H  . DISCONTD: fentaNYL  75 mcg Transdermal Q72H   Continuous Infusions:  PRN Meds:.morphine, ondansetron (ZOFRAN) IV, ondansetron, DISCONTD: acetaminophen, DISCONTD: acetaminophen, DISCONTD: LORazepam, DISCONTD: morphine, DISCONTD:  morphine injection, DISCONTD:  morphine injection, DISCONTD: traMADol  Allergies:  Allergies  Allergen Reactions  . Neosporin  (Neomycin-Bacitracin Zn-Polymyx) Rash  . Sulfa Antibiotics Rash    Family History  Problem Relation Age of Onset  . Osteoarthritis Sister   . Osteoarthritis Brother     Social History:  reports that she has never smoked. She does not have any smokeless tobacco history on file. She reports that she does not drink alcohol or use illicit drugs.  ROS: A complete review of systems was performed.  All systems are negative except for pertinent findings as noted.  Physical Exam:  Vital signs in last 24 hours: Temp:  [99.1 F (37.3 C)-99.7 F (37.6 C)] 99.1 F (37.3 C) (06/21 0540) Pulse Rate:  [86-107] 86  (06/21 0540) Resp:  [16-20] 16  (06/21 0540) BP: (134-150)/(71-78) 150/73 mmHg (06/21 0540) SpO2:  [91 %-94 %] 91 % (06/21 0540) General:  Alert and oriented, No acute distress HEENT: Normocephalic, atraumatic Neck: No JVD or lymphadenopathy Cardiovascular: Regular rate and rhythm Lungs: Clear bilaterally Abdomen: Soft, nontender, nondistended, no abdominal masses Extremities: No edema Neurologic: Grossly intact Depends wet with clear/yellow urine  Laboratory Data:   Basename 01/27/12 0550 01/26/12 2225  WBC 8.5 9.1  HGB 12.6 12.9  HCT 38.4 38.7  PLT 272 286     Basename 01/28/12 0330 01/27/12 0550 01/26/12 2225  NA 137 134* 134*  K 3.7 3.9 3.1*  CL 98 96 93*  GLUCOSE 95 141* 96  BUN 12 10 11   CALCIUM 9.5 9.3 9.4  CREATININE 0.66 0.65 0.64     Results for orders placed during the hospital encounter of 01/26/12 (from the past 24 hour(s))  GLUCOSE, CAPILLARY     Status: Abnormal   Collection Time   01/28/12 12:07 PM      Component Value Range   Glucose-Capillary 251 (*) 70 - 99 mg/dL  GLUCOSE, CAPILLARY     Status: Abnormal   Collection Time   01/28/12  5:05 PM      Component Value Range   Glucose-Capillary 172 (*) 70 - 99 mg/dL  GLUCOSE, CAPILLARY     Status: Abnormal   Collection Time   01/28/12 10:41 PM      Component Value Range   Glucose-Capillary 169  (*) 70 - 99 mg/dL   Comment 1 Notify RN    GLUCOSE, CAPILLARY     Status: Abnormal   Collection Time   01/29/12  7:41 AM      Component Value Range   Glucose-Capillary 213 (*) 70 - 99 mg/dL   Comment 1 Notify RN     Recent Results (from the past 240 hour(s))  URINE CULTURE     Status: Normal   Collection Time   01/26/12 11:30 PM      Component Value Range Status Comment   Specimen Description URINE, CATHETERIZED   Final    Special  Requests NONE   Final    Culture  Setup Time [id number]   Final    Colony Count >=100,000 COLONIES/ML   Final    Culture ENTEROCOCCUS SPECIES   Final    Report Status 01/29/2012 FINAL   Final    Organism ID, Bacteria ENTEROCOCCUS SPECIES   Final     Renal Function:  Basename 01/28/12 0330 01/27/12 0550 01/26/12 2225  CREATININE 0.66 0.65 0.64   The CrCl is unknown because both a height and weight (above a minimum accepted value) are required for this calculation.  Radiologic Imaging: No results found.   Impression/Assessment:  Prolonged incontinence in pt with multiple contributing factors including: DM, spinal stenosis, neuropathy, UTI, and diuretic tx.  She has failed on one anticholinergic treatment in the recent past.    Plan:  Complete tx of UTI for approx 5-7 days  Will check PVR to eval for/rule out overflow incontinence. This is most likely a neurologic issue created by long term DM and spinal stenosis.  If overflow is present, would need to eval for hydronephrosis.  Cr is normal.    Would not add anticholinergic at this time as pt already has constipation issues and and recently failed to see improvement in sx with vesicare.    Also would not recommend chronic foley at this time.  The foley would increase risk for infection and long term could cause urethral erosion.  Additionally, since the pt's family is currently seeking SNF placement, most facilities will not accept pts with a chronic foley.    Lorinda Creed, NP was present during my  discussion of the A/P with the pt.  She is actively participating in the pt's pain management and discussions with the family regarding placement.  She states that comfort care may be an option they consider.  If that occurs then a foley may be appropriate.    Silas Flood 01/29/2012, 10:09 AM

## 2012-01-29 NOTE — Progress Notes (Addendum)
Patient voided into brief and had a change of a wet brief at approximately 11:30 a.m.  At 2:50 p.m., patient was asked to attempt voiding into brief.  Brief was very wet and warm, and patient stated that she was finished.  Immediate post-void bladder scan showed 518 ml of retained urine.  Bladder firm, palpable post-voiding.    It should be noted that patient denied pain during changing of briefs, and during turning for administration of rectal suppository.  She immediately fell asleep following both actions. Valium was also held due to a blood pressure of 100/63 prior to administration.   Will continue to monitor.

## 2012-01-29 NOTE — Consult Note (Signed)
Consult Note from the Palliative Medicine Team at [facility] Patient [name]      DOB: Jul 23, 1933      VWU:[id number]   Consult Requested by: Dr Valentina Lucks     PCP: Lillia Mountain, MD Reason for Consultation:Pain management recommendations     Phone Number:[phone]  Assessment and Plan: 1. Code Status:Full Code-discussed importance of advanced directives and documentation 2. Symptom Control: Significant back/leg/left hand pain requiring high doses for limited pain control  -discussed in detail pain management strategies; consolidate to one medication, convert all present medications and begin Morphine SR 100 mg every 12 hr,  Morphine IR 20 mg every 3 hours prn for breakthru, add scheduled tylenol 650 Mg every 6 hrs, and Valium 5 mg every 8 hr -discussed use of breathing exercises -encouraged nursing to assist to alternative pain management strategies  3. Psycho/Social: emotional support offered to patient and family at bedside, very difficult situation as they discussed options and disposition 4. Spiritual: strong community church support  5. Disposition:family is looking at placement, understanding that care needs cannot ber meet at home   Brief HPI: pt had bladder surgery in March 2013, history of spinal stenosis, compression fx,  S/p steroid injection, increasing back and let pain and decreasing functional status,    ROS: weakness, poor po intake,  Back/leg pain reported at levels of 7/10 even after medicaitons    PMH:  Past Medical History  Diagnosis Date  . Diabetes mellitus     greater than 30 years  . Hypertension   . Incontinence of urine   . Diarrhea   . Neuropathy     bilateral feet  . Arthritis     lower back/hands  . GERD (gastroesophageal reflux disease)      PSH: Past Surgical History  Procedure Date  . Eye surgery     bil.cataract-unknown year  . Incontinence surgery 2005  . Tonsillectomy 1964  . Abdominal hysterectomy     1982  .  Ovarian cyst surgery 1972  . Breast biopsy 1990  . Cholecystectomy 1988  . Cystoscopy 2009    macroplastique inj  . Rectocele repair 10/15/2011    Procedure: POSTERIOR REPAIR (RECTOCELE);  Surgeon: Kathi Ludwig, MD;  Location: [facility];  Service: Urology;;  posterior vaginal vault repair with sacral spinus repair with graft  . Cystoscopy 10/15/2011    Procedure: CYSTOSCOPY;  Surgeon: Kathi Ludwig, MD;  Location: [facility];  Service: Urology;  Laterality: N/A;   I have reviewed the FH and SH and  If appropriate update it with new information. Allergies  Allergen Reactions  . Neosporin (Neomycin-Bacitracin Zn-Polymyx) Rash  . Sulfa Antibiotics Rash   Scheduled Meds:   . amoxicillin  500 mg Oral Q8H  . enoxaparin (LOVENOX) injection  40 mg Subcutaneous Q24H  . feeding supplement  237 mL Oral BID BM  . fentaNYL  75 mcg Transdermal Q72H  . insulin aspart  0-9 Units Subcutaneous TID WC  . insulin NPH  20 Units Subcutaneous QAC supper  . insulin NPH  30 Units Subcutaneous QAC breakfast  . polyethylene glycol  17 g Oral Daily  . potassium chloride SA  20 mEq Oral Daily  . pregabalin  75 mg Oral BID  . senna-docusate  2 tablet Oral QHS  . sodium chloride  3 mL Intravenous Q12H  . triamterene-hydrochlorothiazide  1 tablet Oral Daily  . vitamin A & D      . DISCONTD: cefTRIAXone (ROCEPHIN)  IV  1 g Intravenous QHS  . DISCONTD: fentaNYL  75 mcg Transdermal Q72H   Continuous Infusions:  PRN Meds:.acetaminophen, acetaminophen, LORazepam, morphine injection, ondansetron (ZOFRAN) IV, ondansetron, DISCONTD:  morphine injection, DISCONTD: traMADol    BP 150/73  Pulse 86  Temp 99.1 F (37.3 C) (Oral)  Resp 16  Wt 69.8 kg (153 lb 14.1 oz)  SpO2 91%   PPS:30%   Intake/Output Summary (Last 24 hours) at 01/29/12 0912 Last data filed at 01/28/12 2144  Gross per 24 hour  Intake    357 ml  Output      0 ml  Net    357 ml   LBM:PTA                        Stool Softner:on scheduled miralax, senna s, will add prn ducolax  Physical Exam:  General: chronically ill appearing white female HEENT:  unremarkable Chest:   Shallow BS  CTA CVS: RRR Abdomen:soft NT +BS Ext: without edema Neuro: alert and oriented X3  Labs: CBC    Component Value Date/Time   WBC 8.5 01/27/2012 0550   RBC 4.66 01/27/2012 0550   HGB 12.6 01/27/2012 0550   HCT 38.4 01/27/2012 0550   PLT 272 01/27/2012 0550   MCV 82.4 01/27/2012 0550   MCH 27.0 01/27/2012 0550   MCHC 32.8 01/27/2012 0550   RDW 13.9 01/27/2012 0550   LYMPHSABS 1.5 01/26/2012 2225   MONOABS 0.7 01/26/2012 2225   EOSABS 0.1 01/26/2012 2225   BASOSABS 0.0 01/26/2012 2225    BMET    Component Value Date/Time   NA 137 01/28/2012 0330   K 3.7 01/28/2012 0330   CL 98 01/28/2012 0330   CO2 30 01/28/2012 0330   GLUCOSE 95 01/28/2012 0330   BUN 12 01/28/2012 0330   CREATININE 0.66 01/28/2012 0330   CALCIUM 9.5 01/28/2012 0330   GFRNONAA 84* 01/28/2012 0330   GFRAA >90 01/28/2012 0330    CMP     Component Value Date/Time   NA 137 01/28/2012 0330   K 3.7 01/28/2012 0330   CL 98 01/28/2012 0330   CO2 30 01/28/2012 0330   GLUCOSE 95 01/28/2012 0330   BUN 12 01/28/2012 0330   CREATININE 0.66 01/28/2012 0330   CALCIUM 9.5 01/28/2012 0330   PROT 6.0 01/27/2012 0550   ALBUMIN 2.8* 01/27/2012 0550   AST 13 01/27/2012 0550   ALT 14 01/27/2012 0550   ALKPHOS 91 01/27/2012 0550   BILITOT 0.4 01/27/2012 0550   GFRNONAA 84* 01/28/2012 0330   GFRAA >90 01/28/2012 0330      Time In Time Out Total Time Spent with Patient Total Overall Time  0800 1000 100 minutes 120 minutes    Greater than 50%  of this time was spent counseling and coordinating care related to the above assessment and plan.  Dr Valentina Lucks aware of above   Lorinda Creed NP [phone]

## 2012-01-29 NOTE — Progress Notes (Signed)
Subjective: No change in pain, extreme pain with any movement.  Incontinent of urine. Appetite better.  No BM  Objective: Vital signs in last 24 hours: Temp:  [98.7 F (37.1 C)-99.7 F (37.6 C)] 99.1 F (37.3 C) (06/20 2200) Pulse Rate:  [99-107] 99  (06/20 2200) Resp:  [20] 20  (06/20 2200) BP: (134-164)/(71-92) 134/71 mmHg (06/20 2200) SpO2:  [92 %-94 %] 94 % (06/20 2200) Weight:  [69.8 kg (153 lb 14.1 oz)] 69.8 kg (153 lb 14.1 oz) (06/20 0719) Weight change:  Last BM Date: 01/26/12  Intake/Output from previous day: 06/20 0701 - 06/21 0700 In: 357 [P.O.:100; I.V.:3] Out: -  Intake/Output this shift:    General appearance: alert and cooperative Resp: clear to auscultation bilaterally Cardio: regular rate and rhythm, S1, S2 normal, no murmur, click, rub or gallop Extremities: extremities normal, atraumatic, no cyanosis or edema  Lab Results:  Basename 01/27/12 0550 01/26/12 2225  WBC 8.5 9.1  HGB 12.6 12.9  HCT 38.4 38.7  PLT 272 286   BMET  Basename 01/28/12 0330 01/27/12 0550  NA 137 134*  K 3.7 3.9  CL 98 96  CO2 30 29  GLUCOSE 95 141*  BUN 12 10  CREATININE 0.66 0.65  CALCIUM 9.5 9.3    Studies/Results: No results found.  Medications: I have reviewed the patient's current medications.  Assessment/Plan: Active Problems:   Back and Leg Pain/Spinal stenosis.  Palliative care to see today regarding pain control recommendations.  Continue current regimen fentanyl, lyrica and morphine prn for now.  On Miralax and senekot   UTI  Enterococcal, change to amoxicillin   Urinary Incontinence  Long term problem, urological surgery in March, will ask urology to see, ?Foley for comfort   Diabetes Mellitus, control acceptable, had recent A1C in office.   Hypertension  OK on diuretic   DVT prophylaxis  Lovenox.   Disposition  Likely to need NHP after adequate pain control acheived    LOS: 3 days   [name] 01/29/2012, 7:09 AM

## 2012-01-29 NOTE — Progress Notes (Signed)
PT Cancellation Note  Treatment cancelled today due to patient's refusal to participate.  [name] 01/29/2012, 2:31 PM

## 2012-01-29 NOTE — Progress Notes (Addendum)
Patient's daughter Amy visited, and asked to speak with RN privately to convey concern that mother's orientation/cognition seemed "very different."  She asked whether this might be due to the pain medication.  This RN agreed to document daughter's concern and observations.  Daughter is being kept in the loop through discussions/updates with her brother Tresa Endo, and did not ask to be contacted. However, Irasema Chalk would like a call from Lorinda Creed after tomorrow's rounds to update him - [phone].    Patient did have periods during the day of repeating herself, and misinterpreting conversation between two nurse techs as about her when it was about the nurse tech's new baby.  Patient was unable to recall her home phone # today or use the hospital phone to dial out.  Explained to daughter that urinary tract infection could contribute to signs of confusion/delirium in the elderly, and that today's dose of valium was held due to concerns over hypotension and orientation.  Also to note, patient did NOT have a bowel movement after dulcolax suppository administered.    Will continue to monitor.

## 2012-01-30 ENCOUNTER — Inpatient Hospital Stay (HOSPITAL_COMMUNITY): Payer: Medicare Other

## 2012-01-30 DIAGNOSIS — M545 Low back pain: Secondary | ICD-10-CM

## 2012-01-30 DIAGNOSIS — M6281 Muscle weakness (generalized): Secondary | ICD-10-CM

## 2012-01-30 DIAGNOSIS — T40601A Poisoning by unspecified narcotics, accidental (unintentional), initial encounter: Secondary | ICD-10-CM

## 2012-01-30 LAB — GLUCOSE, CAPILLARY
Glucose-Capillary: 273 mg/dL — ABNORMAL HIGH (ref 70–99)
Glucose-Capillary: 276 mg/dL — ABNORMAL HIGH (ref 70–99)
Glucose-Capillary: 277 mg/dL — ABNORMAL HIGH (ref 70–99)
Glucose-Capillary: 83 mg/dL (ref 70–99)

## 2012-01-30 LAB — DIFFERENTIAL
Basophils Absolute: 0 10*3/uL (ref 0.0–0.1)
Eosinophils Relative: 1 % (ref 0–5)
Lymphocytes Relative: 10 % — ABNORMAL LOW (ref 12–46)
Neutro Abs: 9.1 10*3/uL — ABNORMAL HIGH (ref 1.7–7.7)
Neutrophils Relative %: 81 % — ABNORMAL HIGH (ref 43–77)

## 2012-01-30 LAB — URINALYSIS, ROUTINE W REFLEX MICROSCOPIC
Bilirubin Urine: NEGATIVE
Hgb urine dipstick: NEGATIVE
Specific Gravity, Urine: 1.024 (ref 1.005–1.030)
Urobilinogen, UA: 0.2 mg/dL (ref 0.0–1.0)
pH: 5 (ref 5.0–8.0)

## 2012-01-30 LAB — COMPREHENSIVE METABOLIC PANEL
ALT: 11 U/L (ref 0–35)
AST: 14 U/L (ref 0–37)
Alkaline Phosphatase: 84 U/L (ref 39–117)
CO2: 33 mEq/L — ABNORMAL HIGH (ref 19–32)
Calcium: 9.8 mg/dL (ref 8.4–10.5)
GFR calc non Af Amer: 45 mL/min — ABNORMAL LOW (ref >90)
Potassium: 5.4 mEq/L — ABNORMAL HIGH (ref 3.5–5.1)
Sodium: 135 mEq/L (ref 135–145)

## 2012-01-30 LAB — CBC
MCV: 84.6 fL (ref 78.0–100.0)
Platelets: 289 10*3/uL (ref 150–400)
RBC: 4.8 MIL/uL (ref 3.87–5.11)
RDW: 13.9 % (ref 11.5–15.5)
WBC: 11.3 10*3/uL — ABNORMAL HIGH (ref 4.0–10.5)

## 2012-01-30 MED ORDER — LORAZEPAM 2 MG/ML IJ SOLN
INTRAMUSCULAR | Status: AC
  Filled 2012-01-30: qty 1

## 2012-01-30 MED ORDER — LEVOFLOXACIN IN D5W 250 MG/50ML IV SOLN
250.0000 mg | INTRAVENOUS | Status: DC
  Administered 2012-01-30 – 2012-02-01 (×3): 250 mg via INTRAVENOUS
  Filled 2012-01-30 (×3): qty 50

## 2012-01-30 MED ORDER — LORAZEPAM 2 MG/ML IJ SOLN
0.2500 mg | Freq: Once | INTRAMUSCULAR | Status: AC
  Administered 2012-01-30: 0.26 mg via INTRAVENOUS

## 2012-01-30 MED ORDER — NALOXONE HCL 0.4 MG/ML IJ SOLN
0.4000 mg | Freq: Once | INTRAMUSCULAR | Status: AC
  Administered 2012-01-30: 0.4 mg via INTRAVENOUS

## 2012-01-30 MED ORDER — NALOXONE HCL 0.4 MG/ML IJ SOLN
INTRAMUSCULAR | Status: AC
  Filled 2012-01-30: qty 1

## 2012-01-30 MED ORDER — MORPHINE SULFATE ER 30 MG PO TBCR
60.0000 mg | EXTENDED_RELEASE_TABLET | Freq: Two times a day (BID) | ORAL | Status: DC
  Administered 2012-01-30: 60 mg via ORAL
  Filled 2012-01-30: qty 2

## 2012-01-30 MED ORDER — MORPHINE SULFATE 2 MG/ML IJ SOLN: 1.0000 mg | INTRAMUSCULAR | Status: DC | PRN

## 2012-01-30 NOTE — Progress Notes (Signed)
Patient ID: [name], female   DOB: May 12, 1933, 78 y.o.   MRN: [id number] Have been discussing patient status throughout the day with nursing and palliative team.  She has had decreased LOC, abnormal movements- twitching, decrease respiratory status of 7 rpm.  I ordered narcan earlier this afternoon and she became extremely agitated- hallucinating that there was someone under her bed, that nursing staff were trying to "get her".  She was then given 0.25mg  of ativan x 2 over 30 minutes and is now sedated again.  I am now seeing the patient again with palliative care team and agree with stopping all but prn narcotics.  I agree that she has likely been accumulating narcotics over time which has caused today's symptoms.  May also have some underlying dementia- I am not familiar with her mental status baseline.  Will also hold PO meds for now.  Will order CBC and CMET. Will start levoquin for broad coverage of UTI (enterococcal culture sensitive).  Will place a Foley to facilitate urine drainage, and will remove as soon as she is more stable. Will transfer to SDU overnight for close monitoring of respiratory status.  [name] with Palliative care is attempting to contact family to clarify goals of care.

## 2012-01-30 NOTE — Progress Notes (Addendum)
Subjective: Called by NP from PMT Lorinda Creed to see a patient, who  become unresponsive, rapid response was called and patient received IV narcan, and suddenly woke up as per nursing staff. Patient was agitated and PCP on call Dr Clent Ridges ordered iv ativan. At this time patient is lethargic, and somnolent but responds to sternal rub , and able to answer questions, then again falling asleep. Patient was seen by palliative care NP Lorinda Creed  on 6/21 and her medications were adjusted, she was started on MS contin 100 mg po q 12 hr, but due to lethargy  today MS contin was changed to 60 mg po q 12 hr. Patient received last dose of MS contin 60 mg  today around 9:45 am. Filed Vitals:   01/30/12 1410  BP: 128/78  Pulse: 107  Temp:   Resp: 18    Chest: Clear Bilaterally Heart : S1S2 RRR Abdomen: Soft, nontender Ext : No edema Neuro: Somnolent, responds to sternal rub  A/P Opiate toxicity Urinary retention UTI  Would recommend to transfer to step down unit for closer monitoring Foley catheter to gravity Stop all opiates at this time Start IV antibiotics Would only give iv morphine 1 mg  Prn for pain Continue to monitor. Will follow.    Meredeth Ide Palliative Medicine Team [phone]

## 2012-01-30 NOTE — Progress Notes (Signed)
Spoke to patient's son at bedside, patient seems to be stable , though still somnolent with RR around 10 , O2 sats are 97%  Explained that it could be due to opiates and UTI causing change in mental status. Will continue to monitor.

## 2012-01-30 NOTE — Significant Event (Addendum)
Rapid Response Event Note  Overview: Time Called: 1400 Arrival Time: 1643 Event Type: Neurologic  Initial FocusedAssessment:(Arrival time 1403) Called for unresponsive pt.  Pt has eyes open, pupils around 3mm, unreactive. Will blink eyes,positive gag reflex note   Arms are flaccid, no response to sternal rub.  Slight jerking movement of arms and feet.    Pt just returned to room from CT scan.      Interventions: Placed on monitor with SR to ST.  1435 Narcan 0.4mg  given per order.  Pt aroused, talking and moving about.  Became paranoid thinking someone was under her bed and under her legs.  Pulling at gown and sheets.  Unable to reorient pt.  Ativan 0.25 mg IV given per order.  CBG 276.    Event Summary: 1500 Pt remains awake,and  disoriented.  Resp 16 -20.  Will continue to watch pt for now.     at      at          [name], [name]

## 2012-01-30 NOTE — Progress Notes (Signed)
Patient was very lethargic throughout shift. Scheduled Valium was not given due to drowsiness. Patient did not void on shift and when placed on bedpan she was unable to void. Bladder scan done for 584cc and I&O cath done for 900cc dark amber cloudy urine.

## 2012-01-30 NOTE — Progress Notes (Addendum)
This NP revisited patient to evaluate pain control and mental status.  Husband at bedside.  Patient's mental status alters drastically between, strong voice and appropriate response to flat and stares ahead and needs stimulating to get reaction.  Spoke with  Dr Clent Ridges to report my concerns:  1. Altered mental status, not congruent with pain management dosing I do not believe this is related to meds)/ extremelyy stiff/cog wheel  ROM of head and neck on passive ROM   -ordered repeat UA with culture/ Amoxicillin only at this time  -Head CT without contrast per Dr Clent Ridges  Lorinda Creed NP

## 2012-01-30 NOTE — Progress Notes (Signed)
Subjective: Sleeping. Wakes easily. No complaints this morning. Seems comfortable.  Objective: Vital signs in last 24 hours: Temp:  [99.1 F (37.3 C)-99.7 F (37.6 C)] 99.6 F (37.6 C) (06/22 0506) Pulse Rate:  [82-113] 113  (06/22 0506) Resp:  [18-20] 18  (06/22 0506) BP: (100-115)/(63-69) 107/69 mmHg (06/22 0506) SpO2:  [93 %-97 %] 93 % (06/22 0506) Weight change:  Last BM Date: 01/26/12  Intake/Output from previous day: 06/21 0701 - 06/22 0700 In: 720 [P.O.:720] Out: 900 [Urine:900] Intake/Output this shift:    General appearance: cooperative, fatigued, no distress and pale Resp: clear to auscultation bilaterally Cardio: regular rate and rhythm, S1, S2 normal, no murmur, click, rub or gallop GI: soft, non-tender; bowel sounds normal; no masses,  no organomegaly Extremities: extremities normal, atraumatic, no cyanosis or edema and SCDs on  Lab Results: No results found for this basename: WBC:2,HGB:2,HCT:2,PLT:2 in the last 72 hours BMET  Basename 01/28/12 0330  NA 137  K 3.7  CL 98  CO2 30  GLUCOSE 95  BUN 12  CREATININE 0.66  CALCIUM 9.5    Studies/Results: No results found.  Medications:  I have reviewed the patient's current medications. Scheduled:   . acetaminophen  650 mg Oral Q6H  . amoxicillin  500 mg Oral Q8H  . bisacodyl  10 mg Rectal NOW  . diazepam  5 mg Oral Q8H  . enoxaparin (LOVENOX) injection  40 mg Subcutaneous Q24H  . feeding supplement  237 mL Oral BID BM  . insulin aspart  0-9 Units Subcutaneous TID WC  . insulin NPH  20 Units Subcutaneous QAC supper  . insulin NPH  30 Units Subcutaneous QAC breakfast  . morphine  100 mg Oral Q12H  . polyethylene glycol  17 g Oral Daily  . potassium chloride SA  20 mEq Oral Daily  . pregabalin  75 mg Oral BID  . senna-docusate  2 tablet Oral QHS  . sodium chloride  3 mL Intravenous Q12H  . triamterene-hydrochlorothiazide  1 tablet Oral Daily  . DISCONTD: bisacodyl  5 mg Oral NOW  . DISCONTD:  fentaNYL  75 mcg Transdermal Q72H   Continuous:  NGE:[id number], ondansetron (ZOFRAN) IV, ondansetron, DISCONTD: acetaminophen, DISCONTD: acetaminophen, DISCONTD: LORazepam, DISCONTD: morphine, DISCONTD:  morphine injection  Assessment/Plan: 1) Back and Leg Pain/Spinal stenosis. Palliative care team has seen yesterday and adjusted medications- dc'ed fentanyl, started morphine and diazepam, continued lyrica.  There is some concern for over sedation, but she does arouse easily and answer appropriately this morning.  Palliative care will continue to adjust medications for pain control. 2) UTI- Enterococcal, continue amoxicillin  3) Urinary Incontinence Long term problem, urological surgery in March.  Very large PVR this morning with 900cc's drained.  Likely neurogenic bladder from DM, also new narcotics may be exacerbating.  With infection would like to avoid Foley.  Today will try bladder scan q6hrs and cath if needed.  Urology may offer more guidance.  4) Diabetes Mellitus, control acceptable, had recent A1C in office.  5) Hypertension OK on diuretic  6) DVT prophylaxis Lovenox.  Disposition : spoke with son this morning.  He understands the difficulty of adjusting medications for effective pain control and minimal sedation.  Plan for likely dc to SNF.   LOS: 4 days   [name] 01/30/2012, 8:18 AM

## 2012-01-30 NOTE — Significant Event (Signed)
Was called into patient's room by nurse tech. Patient became unresponsive.Patient was tachy and respirations at 7.  Patient's eyes fixed with no pupil reaction.  Rapid response notified and MD notified.  Narcan IV 0.4mg  given at 1435 and patient finally woke up with extreme agitation and screaming.  Per Dr. Clent Ridges give 0.25mg  IV Ativan and repeat in 15 minutes.  Second dose of ativan given at 1500.  Patient became lethargic again but responds to sternal rub or movement in bed and then falls asleep.  Per MD order patient to be transferred to ICU-Stepdown.

## 2012-01-30 NOTE — Progress Notes (Signed)
Progress Note from the Palliative Medicine Team at [facility]  Subjective: pt is lethargic but arouses easily , oriented to person and place, aware of son in room  O/C pain at this time     Objective: Allergies  Allergen Reactions  . Neosporin (Neomycin-Bacitracin Zn-Polymyx) Rash  . Sulfa Antibiotics Rash   Scheduled Meds:   . acetaminophen  650 mg Oral Q6H  . amoxicillin  500 mg Oral Q8H  . bisacodyl  10 mg Rectal NOW  . enoxaparin (LOVENOX) injection  40 mg Subcutaneous Q24H  . feeding supplement  237 mL Oral BID BM  . insulin aspart  0-9 Units Subcutaneous TID WC  . insulin NPH  20 Units Subcutaneous QAC supper  . insulin NPH  30 Units Subcutaneous QAC breakfast  . morphine  60 mg Oral Q12H  . polyethylene glycol  17 g Oral Daily  . potassium chloride SA  20 mEq Oral Daily  . senna-docusate  2 tablet Oral QHS  . sodium chloride  3 mL Intravenous Q12H  . triamterene-hydrochlorothiazide  1 tablet Oral Daily  . DISCONTD: diazepam  5 mg Oral Q8H  . DISCONTD: morphine  100 mg Oral Q12H  . DISCONTD: pregabalin  75 mg Oral BID   Continuous Infusions:  PRN Meds:.morphine, ondansetron (ZOFRAN) IV, ondansetron  BP 107/69  Pulse 113  Temp 99.6 F (37.6 C) (Oral)  Resp 18  Wt 69.8 kg (153 lb 14.1 oz)  SpO2 93%   PPS:30%  Pain Score:denies presetnly   Intake/Output Summary (Last 24 hours) at 01/30/12 1102 Last data filed at 01/30/12 4098  Gross per 24 hour  Intake    720 ml  Output    900 ml  Net   -180 ml      LBM: 01-30-12   Stool Softner: yes , see orders  Physical Exam:  General: chronically ill appearing HEENT:  Moist mucosu membranes Chest:   diminsihed in bases, CTA CVS: RRR Abdomen:soft NT +BS Ext: without edema Neuro:flat and slow to respond, but responds appropriately  Labs: CBC    Component Value Date/Time   WBC 8.5 01/27/2012 0550   RBC 4.66 01/27/2012 0550   HGB 12.6 01/27/2012 0550   HCT 38.4 01/27/2012 0550   PLT 272 01/27/2012 0550   MCV  82.4 01/27/2012 0550   MCH 27.0 01/27/2012 0550   MCHC 32.8 01/27/2012 0550   RDW 13.9 01/27/2012 0550   LYMPHSABS 1.5 01/26/2012 2225   MONOABS 0.7 01/26/2012 2225   EOSABS 0.1 01/26/2012 2225   BASOSABS 0.0 01/26/2012 2225    BMET    Component Value Date/Time   NA 137 01/28/2012 0330   K 3.7 01/28/2012 0330   CL 98 01/28/2012 0330   CO2 30 01/28/2012 0330   GLUCOSE 95 01/28/2012 0330   BUN 12 01/28/2012 0330   CREATININE 0.66 01/28/2012 0330   CALCIUM 9.5 01/28/2012 0330   GFRNONAA 84* 01/28/2012 0330   GFRAA >90 01/28/2012 0330    CMP     Component Value Date/Time   NA 137 01/28/2012 0330   K 3.7 01/28/2012 0330   CL 98 01/28/2012 0330   CO2 30 01/28/2012 0330   GLUCOSE 95 01/28/2012 0330   BUN 12 01/28/2012 0330   CREATININE 0.66 01/28/2012 0330   CALCIUM 9.5 01/28/2012 0330   PROT 6.0 01/27/2012 0550   ALBUMIN 2.8* 01/27/2012 0550   AST 13 01/27/2012 0550   ALT 14 01/27/2012 0550   ALKPHOS 91 01/27/2012 0550   BILITOT  0.4 01/27/2012 0550   GFRNONAA 84* 01/28/2012 0330   GFRAA >90 01/28/2012 0330       Assessment and Plan: 1. Code Status: Full code, continued to encourage son Tresa Endo) to contemplate with family advanced directives and goals of care for this patient 2. Symptom Control:will reduce opoid dose and minimize medications further in attempt to improve mental status 3. Psycho/Social:emotional support offered to son at bedside  4. Spiritual community [employer] support 5. Disposition: son is interested in talking with social work for anticipatory placement  Lorinda Creed NP  [phone]   1

## 2012-01-30 NOTE — Progress Notes (Signed)
Progress Note from the Palliative Medicine Team at [facility]   Pt very lethargic, but arousal,  Called to unit after rapid response was called, pt received narcan, Dr Clent Ridges aware of change in mental status  Patient become agitated, and then received low dose ativan  Continues to have intermittent altered mental status     Objective: Allergies  Allergen Reactions  . Neosporin (Neomycin-Bacitracin Zn-Polymyx) Rash  . Sulfa Antibiotics Rash   Scheduled Meds:   . acetaminophen  650 mg Oral Q6H  . enoxaparin (LOVENOX) injection  40 mg Subcutaneous Q24H  . feeding supplement  237 mL Oral BID BM  . insulin aspart  0-9 Units Subcutaneous TID WC  . insulin NPH  20 Units Subcutaneous QAC supper  . insulin NPH  30 Units Subcutaneous QAC breakfast  . levofloxacin (LEVAQUIN) IV  250 mg Intravenous Q24H  . LORazepam      . naloxone      . polyethylene glycol  17 g Oral Daily  . potassium chloride SA  20 mEq Oral Daily  . senna-docusate  2 tablet Oral QHS  . sodium chloride  3 mL Intravenous Q12H  . triamterene-hydrochlorothiazide  1 tablet Oral Daily  . DISCONTD: amoxicillin  500 mg Oral Q8H  . DISCONTD: diazepam  5 mg Oral Q8H  . DISCONTD: morphine  100 mg Oral Q12H  . DISCONTD: morphine  60 mg Oral Q12H  . DISCONTD: pregabalin  75 mg Oral BID   Continuous Infusions:  PRN Meds:.morphine, ondansetron (ZOFRAN) IV, ondansetron  BP 128/78  Pulse 107  Temp 99.6 F (37.6 C) (Oral)  Resp 18  Wt 69.8 kg (153 lb 14.1 oz)  SpO2 95%   Intake/Output Summary (Last 24 hours) at 01/30/12 1607 Last data filed at 01/30/12 0605  Gross per 24 hour  Intake    240 ml  Output    900 ml  Net   -660 ml       Physical Exam:   Labs: CBC    Component Value Date/Time   WBC 8.5 01/27/2012 0550   RBC 4.66 01/27/2012 0550   HGB 12.6 01/27/2012 0550   HCT 38.4 01/27/2012 0550   PLT 272 01/27/2012 0550   MCV 82.4 01/27/2012 0550   MCH 27.0 01/27/2012 0550   MCHC 32.8 01/27/2012 0550   RDW 13.9  01/27/2012 0550   LYMPHSABS 1.5 01/26/2012 2225   MONOABS 0.7 01/26/2012 2225   EOSABS 0.1 01/26/2012 2225   BASOSABS 0.0 01/26/2012 2225    BMET    Component Value Date/Time   NA 137 01/28/2012 0330   K 3.7 01/28/2012 0330   CL 98 01/28/2012 0330   CO2 30 01/28/2012 0330   GLUCOSE 95 01/28/2012 0330   BUN 12 01/28/2012 0330   CREATININE 0.66 01/28/2012 0330   CALCIUM 9.5 01/28/2012 0330   GFRNONAA 84* 01/28/2012 0330   GFRAA >90 01/28/2012 0330    CMP     Component Value Date/Time   NA 137 01/28/2012 0330   K 3.7 01/28/2012 0330   CL 98 01/28/2012 0330   CO2 30 01/28/2012 0330   GLUCOSE 95 01/28/2012 0330   BUN 12 01/28/2012 0330   CREATININE 0.66 01/28/2012 0330   CALCIUM 9.5 01/28/2012 0330   PROT 6.0 01/27/2012 0550   ALBUMIN 2.8* 01/27/2012 0550   AST 13 01/27/2012 0550   ALT 14 01/27/2012 0550   ALKPHOS 91 01/27/2012 0550   BILITOT 0.4 01/27/2012 0550   GFRNONAA 84* 01/28/2012 0330  GFRAA >90 01/28/2012 0330       Assessment and Plan: 1.  Possible opioid toxicity: stop all scheduled medications for pain control, utilize only low dose morphine 1mg  every 2 hours prn  And  Monitor patient, plan to transfer to step down  2.  Continue to treat aggressively for other possible sources of altered mental status  Called and spoke to son Tresa Endo and updated him on events.  He plans to come to hospital in one hour.  Dr Clent Ridges aware of above  Lorinda Creed NP      1

## 2012-01-30 NOTE — Progress Notes (Signed)
  Subjective: Patient lethergic. Spoke w/ son.  Objective: Vital signs in last 24 hours: Temp:  [99.1 F (37.3 C)-99.7 F (37.6 C)] 99.6 F (37.6 C) (06/22 0506) Pulse Rate:  [82-113] 113  (06/22 0506) Resp:  [18-20] 18  (06/22 0506) BP: (100-115)/(63-69) 107/69 mmHg (06/22 0506) SpO2:  [93 %-97 %] 93 % (06/22 0506)  Intake/Output from previous day: 06/21 0701 - 06/22 0700 In: 720 [P.O.:720] Out: 900 [Urine:900] Intake/Output this shift:    Physical Exam:  Constitutional: Vital signs reviewed. WD WN in NAD    Cath urine volumes large.  Lab Results: No results found for this basename: HGB:3,HCT:3 in the last 72 hours BMET  Basename 01/28/12 0330  NA 137  K 3.7  CL 98  CO2 30  GLUCOSE 95  BUN 12  CREATININE 0.66  CALCIUM 9.5   No results found for this basename: LABPT:3,INR:3 in the last 72 hours No results found for this basename: LABURIN:1 in the last 72 hours Results for orders placed during the hospital encounter of 01/26/12  URINE CULTURE     Status: Normal   Collection Time   01/26/12 11:30 PM      Component Value Range Status Comment   Specimen Description URINE, CATHETERIZED   Final    Special Requests NONE   Final    Culture  Setup Time [id number]   Final    Colony Count >=100,000 COLONIES/ML   Final    Culture ENTEROCOCCUS SPECIES   Final    Report Status 01/29/2012 FINAL   Final    Organism ID, Bacteria ENTEROCOCCUS SPECIES   Final     Studies/Results: No results found.  Assessment/Plan:   Incontinence--pt on I/O caths for now. Would continue these on a schedule here, and if possible in SNF if she cannot void.   LOS: 4 days   Marcine Matar M 01/30/2012, 9:29 AM

## 2012-01-31 DIAGNOSIS — R4182 Altered mental status, unspecified: Secondary | ICD-10-CM

## 2012-01-31 DIAGNOSIS — F19939 Other psychoactive substance use, unspecified with withdrawal, unspecified: Secondary | ICD-10-CM

## 2012-01-31 LAB — GLUCOSE, CAPILLARY
Glucose-Capillary: 144 mg/dL — ABNORMAL HIGH (ref 70–99)
Glucose-Capillary: 136 mg/dL — ABNORMAL HIGH (ref 70–99)
Glucose-Capillary: 178 mg/dL — ABNORMAL HIGH (ref 70–99)
Glucose-Capillary: 203 mg/dL — ABNORMAL HIGH (ref 70–99)

## 2012-01-31 LAB — BASIC METABOLIC PANEL
BUN: 43 mg/dL — ABNORMAL HIGH (ref 6–23)
Chloride: 99 mEq/L (ref 96–112)
GFR calc Af Amer: 60 mL/min — ABNORMAL LOW (ref >90)
Potassium: 4.8 mEq/L (ref 3.5–5.1)

## 2012-01-31 LAB — CBC
HCT: 39.6 % (ref 36.0–46.0)
Hemoglobin: 12.9 g/dL (ref 12.0–15.0)
WBC: 7.3 10*3/uL (ref 4.0–10.5)

## 2012-01-31 MED ORDER — HYDROMORPHONE HCL PF 1 MG/ML IJ SOLN: 0.5000 mg | INTRAMUSCULAR | Status: DC | PRN

## 2012-01-31 MED ORDER — ACETAMINOPHEN 650 MG RE SUPP
650.0000 mg | RECTAL | Status: DC | PRN
  Administered 2012-01-31: 650 mg via RECTAL
  Filled 2012-01-31: qty 1

## 2012-01-31 MED ORDER — DEXTROSE-NACL 5-0.9 % IV SOLN
INTRAVENOUS | Status: DC
  Administered 2012-01-31: 75 mL via INTRAVENOUS
  Administered 2012-01-31: 1000 mL via INTRAVENOUS
  Administered 2012-02-01 – 2012-02-02 (×2): via INTRAVENOUS

## 2012-01-31 MED ORDER — INSULIN ASPART 100 UNIT/ML ~~LOC~~ SOLN
0.0000 [IU] | SUBCUTANEOUS | Status: DC
  Administered 2012-01-31: 3 [IU] via SUBCUTANEOUS
  Administered 2012-01-31: 1 [IU] via SUBCUTANEOUS
  Administered 2012-01-31: 2 [IU] via SUBCUTANEOUS
  Administered 2012-02-01: 3 [IU] via SUBCUTANEOUS
  Administered 2012-02-01 (×2): 5 [IU] via SUBCUTANEOUS
  Administered 2012-02-01: 3 [IU] via SUBCUTANEOUS
  Administered 2012-02-01: 5 [IU] via SUBCUTANEOUS
  Administered 2012-02-02: 7 [IU] via SUBCUTANEOUS
  Administered 2012-02-02: 3 [IU] via SUBCUTANEOUS
  Administered 2012-02-02: 1 [IU] via SUBCUTANEOUS
  Administered 2012-02-02: 5 [IU] via SUBCUTANEOUS
  Administered 2012-02-02: 3 [IU] via SUBCUTANEOUS
  Administered 2012-02-02: 5 [IU] via SUBCUTANEOUS
  Administered 2012-02-03: 1 [IU] via SUBCUTANEOUS
  Administered 2012-02-03: 2 [IU] via SUBCUTANEOUS

## 2012-01-31 MED ORDER — HYDROMORPHONE HCL PF 1 MG/ML IJ SOLN
0.5000 mg | INTRAMUSCULAR | Status: DC | PRN
  Administered 2012-01-31 – 2012-02-02 (×6): 0.5 mg via INTRAVENOUS
  Filled 2012-01-31 (×6): qty 1

## 2012-01-31 MED ORDER — FENTANYL 25 MCG/HR TD PT72
25.0000 ug | MEDICATED_PATCH | TRANSDERMAL | Status: DC
  Administered 2012-01-31: 25 ug via TRANSDERMAL
  Filled 2012-01-31: qty 1

## 2012-01-31 NOTE — Progress Notes (Signed)
Subjective: Now in SDU due to symptoms of narcotic overdose- sedation, respiratory suppression, muscle fasciculation.  All opiates have been held for almost 24 hours.  Vitals now stable with strong respiratory effort.  She is still sedated, but answers questions and follows commands. Voice and muscle tone are weak.  She denies pain.  She is oriented to person and place.  Objective: Vital signs in last 24 hours: Temp:  [98.4 F (36.9 C)-99.8 F (37.7 C)] 99.6 F (37.6 C) (06/23 0400) Pulse Rate:  [73-113] 109  (06/23 0400) Resp:  [7-20] 17  (06/23 0400) BP: (98-128)/(45-78) 115/52 mmHg (06/23 0400) SpO2:  [95 %-98 %] 97 % (06/23 0400) Weight:  [68.5 kg (151 lb 0.2 oz)-68.9 kg (151 lb 14.4 oz)] 68.5 kg (151 lb 0.2 oz) (06/23 0400) Weight change:  Last BM Date: 01/30/12  Intake/Output from previous day: 06/22 0701 - 06/23 0700 In: 120  Out: 900 [Urine:900] Intake/Output this shift:    General appearance: cooperative, fatigued, no distress and very still, minimal movement, flat affect Resp: clear to auscultation bilaterally and regular respiratory rate, fair effort Cardio: regular rate and rhythm, S1, S2 normal, no murmur, click, rub or gallop GI: soft, bowel sounds positive, slight tenderness with plapation of lower quadrants Extremities: extremities normal, atraumatic, no cyanosis or edema Neurologic: Mental status: Alert, oriented, thought content appropriate, alertness: lethargic, orientation: person, place, affect: flat Cranial nerves: II: pupils equal, round, reactive to light and accommodation, VII: upper facial muscle function normal but weak, VII: lower facial muscle function normal weak, XII: tongue strength normal  Motor: there are muscle faciculations over the arms and chest at rest, is able to move extremities on comand but is very weak Reflexes: withdraws to pain  Lab Results:  Basename 01/31/12 0330 01/30/12 1655  WBC 7.3 11.3*  HGB 12.9 12.9  HCT 39.6 40.6  PLT 258  289   BMET  Basename 01/31/12 0330 01/30/12 1655  NA 138 135  K 4.8 5.4*  CL 99 97  CO2 34* 33*  GLUCOSE 103* 278*  BUN 43* 41*  CREATININE 1.03 1.16*  CALCIUM 9.9 9.8    Studies/Results: Ct Head Wo Contrast  01/30/2012  *RADIOLOGY REPORT*  Clinical Data: Acute mental status changes.  CT HEAD WITHOUT CONTRAST  Technique:  Contiguous axial images were obtained from the base of the skull through the vertex without contrast.  Comparison: None.  Findings: Moderate cortical atrophy and mild deep atrophy consistent with age.  Mild changes of small vessel disease of the white matter diffusely.  No mass lesion.  No midline shift.  No acute hemorrhage or hematoma.  No extra-axial fluid collections. No evidence of acute infarction.  Mild changes of hyperostosis frontalis interna.  Visualized paranasal sinuses, mastoid air cells, and middle ear cavities well- aerated.  Extensive bilateral carotid siphon and left vertebral artery atherosclerosis.  IMPRESSION:  1.  No acute intracranial abnormality. 2.  Mild to moderate generalized atrophy and mild chronic microvascular ischemic changes of the white matter diffusely.  Original Report Authenticated By: Arnell Sieving, M.D.    Medications:  I have reviewed the patient's current medications. Scheduled:   . acetaminophen  650 mg Oral Q6H  . enoxaparin (LOVENOX) injection  40 mg Subcutaneous Q24H  . feeding supplement  237 mL Oral BID BM  . insulin aspart  0-9 Units Subcutaneous TID WC  . insulin NPH  20 Units Subcutaneous QAC supper  . insulin NPH  30 Units Subcutaneous QAC breakfast  . levofloxacin (LEVAQUIN)  IV  250 mg Intravenous Q24H  . LORazepam      . LORazepam  0.26 mg Intravenous Once  . LORazepam  0.26 mg Intravenous Once  . naloxone      . naloxone (NARCAN) injection  0.4 mg Intravenous Once  . polyethylene glycol  17 g Oral Daily  . potassium chloride SA  20 mEq Oral Daily  . senna-docusate  2 tablet Oral QHS  . sodium chloride   3 mL Intravenous Q12H  . triamterene-hydrochlorothiazide  1 tablet Oral Daily  . DISCONTD: amoxicillin  500 mg Oral Q8H  . DISCONTD: diazepam  5 mg Oral Q8H  . DISCONTD: morphine  100 mg Oral Q12H  . DISCONTD: morphine  60 mg Oral Q12H  . DISCONTD: pregabalin  75 mg Oral BID   Continuous:  ZOX:[id number], morphine injection, ondansetron (ZOFRAN) IV, ondansetron, DISCONTD: morphine  Assessment/Plan: 1) Narcotic overdose: improving.  Currently holding all narcotics and sedating agents. Neuro exam is non-focal though she is very weak, CT negative. Respiratory status much improved. Continue to monitor in the SDU for now.  When more alert will need a bedside swallow evaluation. Will start IV fluids for maintenance as she is not taking PO at this time. 2) Back and Leg Pain/Spinal stenosis: currently denies pain.  Holding opiates until less sedated or pain.  Appreciate palliative care team input. 2) UTI- have switched to IV levaquin due to sedation and inability to take PO.  UA was repeated yesterday, culture pending.  She does have a Foley catheter at this time with plan to remove it as soon as she is more stable. 3) Urinary Incontinence Long term problem, urological surgery in March. Likely neurogenic bladder from DM, narcotics may be exacerbating. Currently with Foley cath, will remove asap. Appreciate urology input.  4) Diabetes Mellitus, control acceptable, currently not eating. 5) Hypertension OK holding PO meds 6) DVT prophylaxis Lovenox.  Disposition : Spoke with son again this morning.  He understands that current symptoms are likely due to slow metabolism of narcotics.  She does seem to be improving and I expect her to become more alert throughout the day.  Family would still like intubation, if needed.   LOS: 5 days   [name] 01/31/2012, 8:25 AM

## 2012-01-31 NOTE — Progress Notes (Signed)
While exercising pt extremities it was noted in both arms and turning of head a cogwheel resistance. Movement of lower extremities did not have the same resistance but pt was not making any attempt to assist.   During pupil assessment both were equal and reactive to light and pt was unable to track and follow light for eye movement assessment.   Pt kept saying there was something under her bed, repeatedly. I questioned SCD's and removed them since we have Lovenox coverage for DVT.   When attempting to take her temperature orally she did allow me, to so I obtained it axillary. The pt's agitation and confusion level went to a heightened elevated level. I left to room in order to calm the pt. Sister of pt was very upset and crying stating this is not her normal baseline, that she is lovely, caring and would never say a cross word to anyone.  Place phone call to Dr. Clent Ridges and informed her of findings, she is going to call Neurology for a consult.

## 2012-01-31 NOTE — Progress Notes (Addendum)
Subjective: Patient seen, much more alert. She is now communicating, denies pain at this time.When asked who is in room she said my sister and her husband. Both sister and husband are at bedside. She was able to tell that she is in hospital.  Filed Vitals:   01/31/12 1200  BP:   Pulse:   Temp: 98.8 F (37.1 C)  Resp:     Chest: Clear Bilaterally Heart : S1S2 RRR Abdomen: Soft, nontender Ext : No edema Neuro: Alert, oriented x 3  Moving all extremities  A/P Opiod toxicity ( resolved) Will start her on low dose opiods to prevent opiod withdrawal.  Fentanyl patch at 25 mcg q 72 hr and prn dilaudid 0.5 mg iv Q  4 hr prn. Will titrate up the dose as needed based on patient's pain.    Meredeth Ide Palliative Medicine Team [phone]

## 2012-01-31 NOTE — Progress Notes (Signed)
Subjective: Patient seen, more alert this morning. RR is normal. Following commands.Did not require any morphine last night. Filed Vitals:   01/31/12 0400  BP: 115/52  Pulse: 109  Temp: 99.6 F (37.6 C)  Resp: 17    Chest: Clear Bilaterally Heart : S1S2 RRR Abdomen: Soft, nontender Ext : No edema Neuro: Alert, following commands, able to wiggle her toes and squeeze hand  A/P Opiod toxicity(resolved) UTI  Continue to hold opiods at this time. Will start tylenol suppository prn, Continue  morphine 1 mg iv q 2 hr prn for severe pain Continue iv levaquin     Meredeth Ide Palliative Medicine Team [phone]

## 2012-01-31 NOTE — Consult Note (Signed)
Reason for Consult:Altered mental status Referring Physician: Valentina Lucks  CC: Altered mental status, muscle twitching  HPI: [name] is an 78 y.o. female who was initially admitted due to severe low back pain.  Had previously been maintained with a duragesic patch.  Pain medication was increased after admission and mental status declined.  Some twitching of the muscles was noted as well.  Patient was then given Narcan.  She then became paranoid and poorly cooperative.  She is really not moving her extremities and staff continue to notice muscle twitching.  Patient is now just on a duragesic patch for pain.  All other pain medications have been discontinued.  Blood work is reasonably unremarkable other than elevated blood sugars.  CT of head has been performed and shows no acute changes.  Consult called for further recommendations.   Husband reports patient's baseline as functional.  Patient is able to get up and around.  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  Does not keep check book and does not administer her own medications.  No particular reason was given for this other than there were so many that her husband just did it.  It seems she was able to keep up with her appointments using a calendar.    Past Medical History  Diagnosis Date  . Diabetes mellitus     greater than 30 years  . Hypertension   . Incontinence of urine   . Diarrhea   . Neuropathy     bilateral feet  . Arthritis     lower back/hands  . GERD (gastroesophageal reflux disease)     Past Surgical History  Procedure Date  . Eye surgery     bil.cataract-unknown year  . Incontinence surgery 2005  . Tonsillectomy 1964  . Abdominal hysterectomy     1982  . Ovarian cyst surgery 1972  . Breast biopsy 1990  . Cholecystectomy 1988  . Cystoscopy 2009    macroplastique inj  . Rectocele repair 10/15/2011    Procedure: POSTERIOR REPAIR (RECTOCELE);  Surgeon: Kathi Ludwig, MD;  Location:  [facility];  Service: Urology;;  posterior vaginal vault repair with sacral spinus repair with graft  . Cystoscopy 10/15/2011    Procedure: CYSTOSCOPY;  Surgeon: Kathi Ludwig, MD;  Location: [facility];  Service: Urology;  Laterality: N/A;    Family History  Problem Relation Age of Onset  . Osteoarthritis Sister   . Osteoarthritis Brother     Social History:  reports that she has never smoked. She does not have any smokeless tobacco history on file. She reports that she does not drink alcohol or use illicit drugs.  Allergies  Allergen Reactions  . Neosporin (Neomycin-Bacitracin Zn-Polymyx) Rash  . Sulfa Antibiotics Rash    Medications:  I have reviewed the patient's current medications. Prior to Admission:  Prescriptions prior to admission  Medication Sig Dispense Refill  . amiloride-hydrochlorothiazide (MODURETIC) 5-50 MG tablet Take 1 tablet by mouth daily.      Marland Kitchen aspirin EC 81 MG tablet Take 81 mg by mouth daily.      . cholecalciferol (VITAMIN D) 1000 UNITS tablet Take 1,000 Units by mouth daily.      . fentaNYL (DURAGESIC - DOSED MCG/HR) 50 MCG/HR Place 1 patch onto the skin every 3 (three) days.      . insulin lispro (HUMALOG) 100 UNIT/ML injection Inject 5 Units into the skin 2 (two) times daily. For CBG > 150;  sliding scale.      . insulin NPH (HUMULIN N,NOVOLIN N) 100 UNIT/ML injection Inject 40 Units into the skin 2 (two) times daily before a meal. 55 units in AM and 45 units in PM      . LORazepam (ATIVAN) 0.5 MG tablet Take 0.5 mg by mouth every 8 (eight) hours as needed. anxiety      . potassium chloride SA (K-DUR,KLOR-CON) 20 MEQ tablet Take 20 mEq by mouth daily.      . traMADol (ULTRAM) 50 MG tablet Take 50 mg by mouth every 6 (six) hours as needed. pain      . vitamin E 400 UNIT capsule Take 400 Units by mouth daily.       Scheduled:   . enoxaparin (LOVENOX) injection  40 mg Subcutaneous Q24H  . fentaNYL  25 mcg Transdermal  Q72H  . insulin aspart  0-9 Units Subcutaneous Q4H  . levofloxacin (LEVAQUIN) IV  250 mg Intravenous Q24H  . LORazepam      . LORazepam  0.26 mg Intravenous Once  . LORazepam  0.26 mg Intravenous Once  . naloxone      . naloxone (NARCAN) injection  0.4 mg Intravenous Once  . DISCONTD: acetaminophen  650 mg Oral Q6H  . DISCONTD: feeding supplement  237 mL Oral BID BM  . DISCONTD: insulin aspart  0-9 Units Subcutaneous TID WC  . DISCONTD: insulin NPH  20 Units Subcutaneous QAC supper  . DISCONTD: insulin NPH  30 Units Subcutaneous QAC breakfast  . DISCONTD: polyethylene glycol  17 g Oral Daily  . DISCONTD: potassium chloride SA  20 mEq Oral Daily  . DISCONTD: senna-docusate  2 tablet Oral QHS  . DISCONTD: sodium chloride  3 mL Intravenous Q12H  . DISCONTD: triamterene-hydrochlorothiazide  1 tablet Oral Daily    ROS: History obtained from the patient  General ROS: negative for - chills, fatigue, fever, night sweats, weight gain or weight loss Psychological ROS: as noted per HPI Ophthalmic ROS: negative for - blurry vision, double vision, eye pain or loss of vision ENT ROS: negative for - epistaxis, nasal discharge, oral lesions, sore throat, tinnitus or vertigo Allergy and Immunology ROS: negative for - hives or itchy/watery eyes Hematological and Lymphatic ROS: negative for - bleeding problems, bruising or swollen lymph nodes Endocrine ROS: negative for - galactorrhea, hair pattern changes, polydipsia/polyuria or temperature intolerance Respiratory ROS: negative for - cough, hemoptysis, shortness of breath or wheezing Cardiovascular ROS: negative for - chest pain, dyspnea on exertion, edema or irregular heartbeat Gastrointestinal ROS: negative for - abdominal pain, diarrhea, hematemesis, nausea/vomiting or stool incontinence Genito-Urinary ROS: negative for - dysuria, hematuria, incontinence or urinary frequency/urgency Musculoskeletal ROS: back pain Neurological ROS: as noted in  HPI Dermatological ROS: negative for rash and skin lesion changes  Physical Examination: Blood pressure 115/52, pulse 117, temperature 98.8 F (37.1 C), temperature source Oral, resp. rate 12, height 5\' 5"  (1.651 m), weight 68.5 kg (151 lb 0.2 oz), SpO2 97.00%.  Neurologic Examination Mental Status: Initially patient did not respond.  Head was turned to the left and towel was on forehead.  As we continued to talk about her in the room she did alert and open her eyes.  She would smile appropriately with joking, etc.  Follows commands.  Speech quiet but fluent and louder as encouraged.   Cranial Nerves: II: visual fields grossly normal with patient blinking to bilateral confrontation, pupils equal, round, reactive to light and accommodation III,IV, VI: ptosis not present, extra-ocular motions  intact bilaterally V,VII: smile symmetric with decrease in left NLF, facial light touch sensation normal bilaterally VIII: hearing normal bilaterally IX,X: gag reflex present XI: trapezius strength/neck flexion strength normal bilaterally XII: tongue strength normal  Motor: Patient does not lift extremities to command but when I lift able to maintain against gravity before dropping back to bed.  Has more difficulty with the RUE secondary to pain Tone and bulk:normal tone throughout; no atrophy noted Sensory: Light touch intact throughout, bilaterally Deep Tendon Reflexes: 2+ in the upper extremities and absent in the lower extremities Plantars: Right: equivocal   Left: equivocal Cerebellar: Does not perform  Results for orders placed during the hospital encounter of 01/26/12 (from the past 48 hour(s))  GLUCOSE, CAPILLARY     Status: Abnormal   Collection Time   01/29/12  5:01 PM      Component Value Range Comment   Glucose-Capillary 181 (*) 70 - 99 mg/dL   GLUCOSE, CAPILLARY     Status: Abnormal   Collection Time   01/29/12  9:56 PM      Component Value Range Comment   Glucose-Capillary 202 (*)  70 - 99 mg/dL   GLUCOSE, CAPILLARY     Status: Abnormal   Collection Time   01/30/12  8:01 AM      Component Value Range Comment   Glucose-Capillary 253 (*) 70 - 99 mg/dL    Comment 1 Notify RN     GLUCOSE, CAPILLARY     Status: Abnormal   Collection Time   01/30/12 12:11 PM      Component Value Range Comment   Glucose-Capillary 273 (*) 70 - 99 mg/dL    Comment 1 Notify RN     GLUCOSE, CAPILLARY     Status: Abnormal   Collection Time   01/30/12  2:11 PM      Component Value Range Comment   Glucose-Capillary 276 (*) 70 - 99 mg/dL    Comment 1 Notify RN     URINALYSIS, ROUTINE W REFLEX MICROSCOPIC     Status: Abnormal   Collection Time   01/30/12  4:00 PM      Component Value Range Comment   Color, Urine YELLOW  YELLOW    APPearance CLOUDY (*) CLEAR    Specific Gravity, Urine 1.024  1.005 - 1.030    pH 5.0  5.0 - 8.0    Glucose, UA NEGATIVE  NEGATIVE mg/dL    Hgb urine dipstick NEGATIVE  NEGATIVE    Bilirubin Urine NEGATIVE  NEGATIVE    Ketones, ur NEGATIVE  NEGATIVE mg/dL    Protein, ur NEGATIVE  NEGATIVE mg/dL    Urobilinogen, UA 0.2  0.0 - 1.0 mg/dL    Nitrite NEGATIVE  NEGATIVE    Leukocytes, UA MODERATE (*) NEGATIVE   URINE MICROSCOPIC-ADD ON     Status: Normal   Collection Time   01/30/12  4:00 PM      Component Value Range Comment   WBC, UA 3-6  <3 WBC/hpf    Urine-Other AMORPHOUS URATES/PHOSPHATES     GLUCOSE, CAPILLARY     Status: Abnormal   Collection Time   01/30/12  4:46 PM      Component Value Range Comment   Glucose-Capillary 277 (*) 70 - 99 mg/dL    Comment 1 Notify RN     CBC     Status: Abnormal   Collection Time   01/30/12  4:55 PM      Component Value Range Comment   WBC 11.3 (*)  4.0 - 10.5 K/uL    RBC 4.80  3.87 - 5.11 MIL/uL    Hemoglobin 12.9  12.0 - 15.0 g/dL    HCT 16.1  09.6 - 04.5 %    MCV 84.6  78.0 - 100.0 fL    MCH 26.9  26.0 - 34.0 pg    MCHC 31.8  30.0 - 36.0 g/dL    RDW 40.9  81.1 - 91.4 %    Platelets 289  150 - 400 K/uL    DIFFERENTIAL     Status: Abnormal   Collection Time   01/30/12  4:55 PM      Component Value Range Comment   Neutrophils Relative 81 (*) 43 - 77 %    Neutro Abs 9.1 (*) 1.7 - 7.7 K/uL    Lymphocytes Relative 10 (*) 12 - 46 %    Lymphs Abs 1.1  0.7 - 4.0 K/uL    Monocytes Relative 8  3 - 12 %    Monocytes Absolute 0.9  0.1 - 1.0 K/uL    Eosinophils Relative 1  0 - 5 %    Eosinophils Absolute 0.2  0.0 - 0.7 K/uL    Basophils Relative 0  0 - 1 %    Basophils Absolute 0.0  0.0 - 0.1 K/uL   COMPREHENSIVE METABOLIC PANEL     Status: Abnormal   Collection Time   01/30/12  4:55 PM      Component Value Range Comment   Sodium 135  135 - 145 mEq/L    Potassium 5.4 (*) 3.5 - 5.1 mEq/L    Chloride 97  96 - 112 mEq/L    CO2 33 (*) 19 - 32 mEq/L    Glucose, Bld 278 (*) 70 - 99 mg/dL    BUN 41 (*) 6 - 23 mg/dL    Creatinine, Ser 7.82 (*) 0.50 - 1.10 mg/dL    Calcium 9.8  8.4 - 95.6 mg/dL    Total Protein 6.5  6.0 - 8.3 g/dL    Albumin 2.7 (*) 3.5 - 5.2 g/dL    AST 14  0 - 37 U/L    ALT 11  0 - 35 U/L    Alkaline Phosphatase 84  39 - 117 U/L    Total Bilirubin 0.3  0.3 - 1.2 mg/dL    GFR calc non Af Amer 45 (*) >90 mL/min    GFR calc Af Amer 52 (*) >90 mL/min   MRSA PCR SCREENING     Status: Normal   Collection Time   01/30/12  5:39 PM      Component Value Range Comment   MRSA by PCR NEGATIVE  NEGATIVE   GLUCOSE, CAPILLARY     Status: Normal   Collection Time   01/30/12 10:04 PM      Component Value Range Comment   Glucose-Capillary 83  70 - 99 mg/dL    Comment 1 Documented in Chart      Comment 2 Notify RN     BASIC METABOLIC PANEL     Status: Abnormal   Collection Time   01/31/12  3:30 AM      Component Value Range Comment   Sodium 138  135 - 145 mEq/L    Potassium 4.8  3.5 - 5.1 mEq/L    Chloride 99  96 - 112 mEq/L    CO2 34 (*) 19 - 32 mEq/L    Glucose, Bld 103 (*) 70 - 99 mg/dL    BUN 43 (*) 6 -  23 mg/dL    Creatinine, Ser 1.61  0.50 - 1.10 mg/dL    Calcium 9.9  8.4 - 09.6  mg/dL    GFR calc non Af Amer 51 (*) >90 mL/min    GFR calc Af Amer 60 (*) >90 mL/min   CBC     Status: Normal   Collection Time   01/31/12  3:30 AM      Component Value Range Comment   WBC 7.3  4.0 - 10.5 K/uL    RBC 4.69  3.87 - 5.11 MIL/uL    Hemoglobin 12.9  12.0 - 15.0 g/dL    HCT 04.5  40.9 - 81.1 %    MCV 84.4  78.0 - 100.0 fL    MCH 27.5  26.0 - 34.0 pg    MCHC 32.6  30.0 - 36.0 g/dL    RDW 91.4  78.2 - 95.6 %    Platelets 258  150 - 400 K/uL   GLUCOSE, CAPILLARY     Status: Abnormal   Collection Time   01/31/12  8:25 AM      Component Value Range Comment   Glucose-Capillary 144 (*) 70 - 99 mg/dL   GLUCOSE, CAPILLARY     Status: Abnormal   Collection Time   01/31/12 12:01 PM      Component Value Range Comment   Glucose-Capillary 136 (*) 70 - 99 mg/dL     Recent Results (from the past 240 hour(s))  URINE CULTURE     Status: Normal   Collection Time   01/26/12 11:30 PM      Component Value Range Status Comment   Specimen Description URINE, CATHETERIZED   Final    Special Requests NONE   Final    Culture  Setup Time [id number]   Final    Colony Count >=100,000 COLONIES/ML   Final    Culture ENTEROCOCCUS SPECIES   Final    Report Status 01/29/2012 FINAL   Final    Organism ID, Bacteria ENTEROCOCCUS SPECIES   Final   MRSA PCR SCREENING     Status: Normal   Collection Time   01/30/12  5:39 PM      Component Value Range Status Comment   MRSA by PCR NEGATIVE  NEGATIVE Final     Ct Head Wo Contrast  01/30/2012  *RADIOLOGY REPORT*  Clinical Data: Acute mental status changes.  CT HEAD WITHOUT CONTRAST  Technique:  Contiguous axial images were obtained from the base of the skull through the vertex without contrast.  Comparison: None.  Findings: Moderate cortical atrophy and mild deep atrophy consistent with age.  Mild changes of small vessel disease of the white matter diffusely.  No mass lesion.  No midline shift.  No acute hemorrhage or hematoma.  No extra-axial fluid  collections. No evidence of acute infarction.  Mild changes of hyperostosis frontalis interna.  Visualized paranasal sinuses, mastoid air cells, and middle ear cavities well- aerated.  Extensive bilateral carotid siphon and left vertebral artery atherosclerosis.  IMPRESSION:  1.  No acute intracranial abnormality. 2.  Mild to moderate generalized atrophy and mild chronic microvascular ischemic changes of the white matter diffusely.  Original Report Authenticated By: Arnell Sieving, M.D.     Assessment/Plan:  Patient Active Hospital Problem List: Altered Mental Status   Assessment:  Patient has had quite a bit of manipulation with her medications recently.  Today she actually gives the impression of someone who does not want to cooperate and not someone who can not  cooperate.  No significant focality noted on exam.  Follows commands for the most and seems to be understanding of what is being communicated even if she chooses not to respond verbally at times.  The more engaged she became, the more she responded.  No twitching was noted on my exam today.  Work up to date has been unremarkable.  I suspect many of the changes that the patient has had are related to her pain medications and the abrupt reversal of the same.  At this point am also concerned about a component of depression.  This may also be related to her medications and recent decrease in mobility.  Although not noted on my exam since there have been some involuntary movements would rule out seizure.     Plan: 1.  EEG  2.  Refrain for further narcotic use  3.  Will continue to follow clinically.  Would expect further improvement.     Thana Farr, MD [facility] [phone] 01/31/2012, 3:47 PM

## 2012-02-01 LAB — CBC
Platelets: 232 10*3/uL (ref 150–400)
RBC: 4.63 MIL/uL (ref 3.87–5.11)
RDW: 13.6 % (ref 11.5–15.5)
WBC: 7.7 10*3/uL (ref 4.0–10.5)

## 2012-02-01 LAB — GLUCOSE, CAPILLARY
Glucose-Capillary: 248 mg/dL — ABNORMAL HIGH (ref 70–99)
Glucose-Capillary: 261 mg/dL — ABNORMAL HIGH (ref 70–99)
Glucose-Capillary: 270 mg/dL — ABNORMAL HIGH (ref 70–99)
Glucose-Capillary: 272 mg/dL — ABNORMAL HIGH (ref 70–99)
Glucose-Capillary: 274 mg/dL — ABNORMAL HIGH (ref 70–99)

## 2012-02-01 LAB — BASIC METABOLIC PANEL
CO2: 26 mEq/L (ref 19–32)
Chloride: 100 mEq/L (ref 96–112)
GFR calc Af Amer: >90 mL/min (ref >90)
Potassium: 4.1 mEq/L (ref 3.5–5.1)
Sodium: 140 mEq/L (ref 135–145)

## 2012-02-01 MED ORDER — CLONIDINE HCL 0.1 MG PO TABS
0.1000 mg | ORAL_TABLET | Freq: Three times a day (TID) | ORAL | Status: DC | PRN
  Filled 2012-02-01: qty 1

## 2012-02-01 MED ORDER — VITAMINS A & D EX OINT
TOPICAL_OINTMENT | CUTANEOUS | Status: AC
  Administered 2012-02-01: 15:00:00
  Filled 2012-02-01: qty 5

## 2012-02-01 MED ORDER — INSULIN GLARGINE 100 UNIT/ML ~~LOC~~ SOLN
10.0000 [IU] | Freq: Every day | SUBCUTANEOUS | Status: DC
  Administered 2012-02-01: 10 [IU] via SUBCUTANEOUS
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
## 2012-02-01 NOTE — Progress Notes (Signed)
Progress Note from the Palliative Medicine Team at [facility]  Subjective:  Lethargic yet arouses easily to gentle touch , oriented to person and place,      Objective: Allergies  Allergen Reactions  . Neosporin (Neomycin-Bacitracin Zn-Polymyx) Rash  . Sulfa Antibiotics Rash   Scheduled Meds:   . enoxaparin (LOVENOX) injection  40 mg Subcutaneous Q24H  . fentaNYL  25 mcg Transdermal Q72H  . insulin aspart  0-9 Units Subcutaneous Q4H  . insulin glargine  10 Units Subcutaneous Daily  . levofloxacin (LEVAQUIN) IV  250 mg Intravenous Q24H  . vitamin A & D       Continuous Infusions:   . dextrose 5 % and 0.9% NaCl 75 mL/hr at 02/01/12 1057   PRN Meds:.acetaminophen, HYDROmorphone (DILAUDID) injection, ondansetron (ZOFRAN) IV, ondansetron, DISCONTD: cloNIDine  BP 161/74  Pulse 87  Temp 97.4 F (36.3 C) (Oral)  Resp 32  Ht 5\' 4"  (1.626 m)  Wt 67.132 kg (148 lb)  BMI 25.40 kg/m2  SpO2 95%   PPS: 30%   Pain Score: report "my back hurts alittle bit" Pain Location back   Intake/Output Summary (Last 24 hours) at 02/01/12 1642 Last data filed at 02/01/12 1205  Gross per 24 hour  Intake   1450 ml  Output   1525 ml  Net    -75 ml       Physical Exam:  General: chronically ill appearing, lethargic HEENT:  Moist membranes Chest:   CTA CVS: tachycardic Abdomen:soft NT +BS Ext: without edema, pt allow me touch her legswithout pain or agitation Neuro:lethargic, oriented X2  Labs: CBC    Component Value Date/Time   WBC 7.7 02/01/2012 0331   RBC 4.63 02/01/2012 0331   HGB 12.5 02/01/2012 0331   HCT 39.1 02/01/2012 0331   PLT 232 02/01/2012 0331   MCV 84.4 02/01/2012 0331   MCH 27.0 02/01/2012 0331   MCHC 32.0 02/01/2012 0331   RDW 13.6 02/01/2012 0331   LYMPHSABS 1.1 01/30/2012 1655   MONOABS 0.9 01/30/2012 1655   EOSABS 0.2 01/30/2012 1655   BASOSABS 0.0 01/30/2012 1655    BMET    Component Value Date/Time   NA 140 02/01/2012 0331   K 4.1 02/01/2012 0331   CL 100  02/01/2012 0331   CO2 26 02/01/2012 0331   GLUCOSE 288* 02/01/2012 0331   BUN 41* 02/01/2012 0331   CREATININE 0.66 02/01/2012 0331   CALCIUM 9.7 02/01/2012 0331   GFRNONAA 84* 02/01/2012 0331   GFRAA >90 02/01/2012 0331    CMP     Component Value Date/Time   NA 140 02/01/2012 0331   K 4.1 02/01/2012 0331   CL 100 02/01/2012 0331   CO2 26 02/01/2012 0331   GLUCOSE 288* 02/01/2012 0331   BUN 41* 02/01/2012 0331   CREATININE 0.66 02/01/2012 0331   CALCIUM 9.7 02/01/2012 0331   PROT 6.5 01/30/2012 1655   ALBUMIN 2.7* 01/30/2012 1655   AST 14 01/30/2012 1655   ALT 11 01/30/2012 1655   ALKPHOS 84 01/30/2012 1655   BILITOT 0.3 01/30/2012 1655   GFRNONAA 84* 02/01/2012 0331   GFRAA >90 02/01/2012 0331        Assessment and Plan: 1. Code Status: Full 2. Symptom Control:     -is more comfortable now on low dose fentanyl patch and prn dilaudid    - Blood pressure slightly elevated, discussed prn dose of clonidine 0.1 mg with Dr Valentina Lucks, suspect it could be related to opioid withdrawal, he will consider  on his morning assessment  3. Psycho/Social:sat quietly at bedside with patient for emotional support 4. Disposition: Family looking to SNF placement on dc, SW involved     Lorinda Creed NP  [phone]     1

## 2012-02-01 NOTE — Clinical Social Work Placement (Signed)
Clinical Social Work Department CLINICAL SOCIAL WORK PLACEMENT NOTE 02/01/2012  Patient:  [name], [name]  Account Number:  [id number] Admit date:  01/26/2012  Clinical Social Worker:  Jodelle Red  Date/time:  02/01/2012 10:08 AM  Clinical Social Work is seeking post-discharge placement for this patient at the following level of care:   SKILLED NURSING   (*CSW will update this form in Epic as items are completed)   02/01/2012  Patient/family provided with [facility] Department of Clinical Social Work's list of facilities offering this level of care within the geographic area requested by the patient (or if unable, by the patient's family).  02/01/2012  Patient/family informed of their freedom to choose among providers that offer the needed level of care, that participate in Medicare, Medicaid or managed care program needed by the patient, have an available bed and are willing to accept the patient.  02/01/2012  Patient/family informed of [facility]' ownership interest in [facility], as well as of the fact that they are under no obligation to receive care at this facility.  PASARR submitted to EDS on 02/01/2012 PASARR number received from [employer] on   FL2 transmitted to all facilities in geographic area requested by pt/family on   FL2 transmitted to all facilities within larger geographic area on   Patient informed that his/her managed care company has contracts with or will negotiate with  certain facilities, including the following:     Patient/family informed of bed offers received:   Patient chooses bed at  Physician recommends and patient chooses bed at    Patient to be transferred to  on   Patient to be transferred to facility by   The following physician request were entered in Epic:   Additional Comments:  FL2 to follow after PT eval completed.  Vennie Homans, lcswa 02/01/2012 10:10 AM [phone]

## 2012-02-01 NOTE — Progress Notes (Signed)
OT Note:  Pt does not feel up to working with OT:  Too much pain. [place], [place] [phone] 02/01/2012

## 2012-02-01 NOTE — Progress Notes (Signed)
Subjective: Very confused and somewhat agitated/paranoid today.  Objective: Vital signs in last 24 hours: Temp:  [96.6 F (35.9 C)-100 F (37.8 C)] 98.4 F (36.9 C) (06/24 0400) Pulse Rate:  [75-122] 112  (06/24 0600) Resp:  [9-35] 35  (06/24 0600) BP: (108-158)/(53-80) 158/80 mmHg (06/24 0600) SpO2:  [96 %-100 %] 100 % (06/24 0600) Weight:  [67.4 kg (148 lb 9.4 oz)] 67.4 kg (148 lb 9.4 oz) (06/24 0000) Weight change: -1.5 kg (-3 lb 4.9 oz) Last BM Date: 01/30/12  Intake/Output from previous day: 06/23 0701 - 06/24 0700 In: 1535 [I.V.:1435; IV Piggyback:100] Out: 1200 [Urine:1200] Intake/Output this shift: Total I/O In: 825 [I.V.:825] Out: 650 [Urine:650]  General appearance: delirious Resp: clear to auscultation bilaterally Cardio: regular rate and rhythm, S1, S2 normal, no murmur, click, rub or gallop GI: soft, non-tender; bowel sounds normal; no masses,  no organomegaly Extremities: extremities normal, atraumatic, no cyanosis or edema  Lab Results:  Basename 02/01/12 0331 01/31/12 0330  WBC 7.7 7.3  HGB 12.5 12.9  HCT 39.1 39.6  PLT 232 258   BMET  Basename 02/01/12 0331 01/31/12 0330  NA 140 138  K 4.1 4.8  CL 100 99  CO2 26 34*  GLUCOSE 288* 103*  BUN 41* 43*  CREATININE 0.66 1.03  CALCIUM 9.7 9.9    Studies/Results: Ct Head Wo Contrast  01/30/2012  *RADIOLOGY REPORT*  Clinical Data: Acute mental status changes.  CT HEAD WITHOUT CONTRAST  Technique:  Contiguous axial images were obtained from the base of the skull through the vertex without contrast.  Comparison: None.  Findings: Moderate cortical atrophy and mild deep atrophy consistent with age.  Mild changes of small vessel disease of the white matter diffusely.  No mass lesion.  No midline shift.  No acute hemorrhage or hematoma.  No extra-axial fluid collections. No evidence of acute infarction.  Mild changes of hyperostosis frontalis interna.  Visualized paranasal sinuses, mastoid air cells, and  middle ear cavities well- aerated.  Extensive bilateral carotid siphon and left vertebral artery atherosclerosis.  IMPRESSION:  1.  No acute intracranial abnormality. 2.  Mild to moderate generalized atrophy and mild chronic microvascular ischemic changes of the white matter diffusely.  Original Report Authenticated By: Arnell Sieving, M.D.    Medications: I have reviewed the patient's current medications.  Assessment/Plan: Chronic pain/spinal stenosis,  Now only on low dose of  Fentanyl. Opoid toxicity resolved.   Delirium  Likely secondary to drug side effect/withdrawl + ICU psychosis.  Transfer to palliative care.     Continue low dose of fentanyl UTI day 6/7 antibiotics Urinary Incontinence  Appreciate Urology input, likely neurogenic bladder with overflow.  Will attempt to d/c foley tomorrow and transition to I/O cath. Diabetes Mellitus  Poorly controlled Disposition, will eventually need NHP   LOS: 6 days   Rosaland Shiffman JOSEPH 02/01/2012, 6:53 AM

## 2012-02-01 NOTE — Progress Notes (Signed)
Subjective: Patient reports continued back pain. Hx enterococus UTI on day 6/7 treatment. On CIC. Note: severe lumbosacral disc disease.   Objective: Vital signs in last 24 hours: Temp:  [96.6 F (35.9 C)-98.4 F (36.9 C)] 97.4 F (36.3 C) (06/24 1210) Pulse Rate:  [75-116] 87  (06/24 1250) Resp:  [9-52] 32  (06/24 1250) BP: (135-172)/(62-81) 161/74 mmHg (06/24 1250) SpO2:  [95 %-100 %] 95 % (06/24 1250) FiO2 (%):  [2 %] 2 % (06/24 0900) Weight:  [67.132 kg (148 lb)-67.4 kg (148 lb 9.4 oz)] 67.132 kg (148 lb) (06/24 1210)A  Intake/Output from previous day: 06/23 0701 - 06/24 0700 In: 1610 [I.V.:1510; IV Piggyback:100] Out: 1200 [Urine:1200] Intake/Output this shift: Total I/O In: 225 [I.V.:225] Out: 325 [Urine:325]  Past Medical History  Diagnosis Date  . Diabetes mellitus     greater than 30 years  . Hypertension   . Incontinence of urine   . Diarrhea   . Neuropathy     bilateral feet  . Arthritis     lower back/hands  . GERD (gastroesophageal reflux disease)     Physical Exam:  General:thin elderly female in pain, with stupor 2ndary pain med.  Lungs - Normal respiratory effort, chest expands symmetrically.  Abdomen - Soft, non-tender & non-distended.  Lab Results:  Basename 02/01/12 0331 01/31/12 0330 01/30/12 1655  WBC 7.7 7.3 11.3*  HGB 12.5 12.9 12.9  HCT 39.1 39.6 40.6   BMET  Basename 02/01/12 0331 01/31/12 0330  NA 140 138  K 4.1 4.8  CL 100 99  CO2 26 34*  GLUCOSE 288* 103*  BUN 41* 43*  CREATININE 0.66 1.03  CALCIUM 9.7 9.9   No results found for this basename: LABURIN:1 in the last 72 hours Results for orders placed during the hospital encounter of 01/26/12  URINE CULTURE     Status: Normal   Collection Time   01/26/12 11:30 PM      Component Value Range Status Comment   Specimen Description URINE, CATHETERIZED   Final    Special Requests NONE   Final    Culture  Setup Time [id number]   Final    Colony Count >=100,000 COLONIES/ML    Final    Culture ENTEROCOCCUS SPECIES   Final    Report Status 01/29/2012 FINAL   Final    Organism ID, Bacteria ENTEROCOCCUS SPECIES   Final   MRSA PCR SCREENING     Status: Normal   Collection Time   01/30/12  5:39 PM      Component Value Range Status Comment   MRSA by PCR NEGATIVE  NEGATIVE Final     Studies/Results: @RISRSLT24 @  *RADIOLOGY REPORT*  Clinical Data: Left lower quadrant pain.  CT LUMBAR SPINE WITHOUT CONTRAST  Technique: Multidetector CT imaging of the lumbar spine was  performed without intravenous contrast administration. Multiplanar  CT image reconstructions were also generated.  Comparison: 12/09/2011 abdomen pelvis CT.  Findings: Leftward curvature and multilevel degenerative changes,  most pronounced at L3-4 and L5 S1.  There is a broad-based L2-3 disc bulge results in mild central  canal narrowing.  L3-4 paracentral disc bulge results in moderate right paracentral  narrowing and moderate neural foraminal narrowing.  Vacuum disc phenomenon at L4-5. Disc bulge results in severe  central canal narrowing.  There is mild superior endplate deformity at T12 with less than 25%  vertebral body height loss. No retropulsion.  See separate abdomen pelvis CT report for intra-abdominal findings.  IMPRESSION:  Multilevel degenerative changes and  disc bulges as described above.  Mild superior endplate deformity of T12 is unchanged from May, with  less than 25% vertebral body height loss.  Original Report Authenticated By: Waneta Martins, M.D.    Assessment/Plan:  Severe Lumbar and sacral spine central herniation and disc disease. She has associated Enterococcus UTI, now treated. She has hypotonic neurogenic- probably areflexic- bladder, with probable inability to volentairy void , but with fixed bladder outlet allowing some incontinence- typical of sub-sacral lesions. She is requiring large amounts of pain medicine, which will cause constipation, and, therefore, a  higher chance for bacterial contamination. I would advise a GU plan for her as follows:  1. Continued Clean internmittant Catheterization at SNF QID.  2. Rx UTI, then antibiotic suppression therapy with trimethoprim 100mg /day if not sulfa allergy, or methanamine as an alternative. Re-ck u/a and c/s q 4 months. 3. Daily Miralax 17gm. 4. Q year upper tract evaluation.   5. Re-visit plan if pt becomes ambulatory and is able to get off pain med.    Margarie Mcguirt I 02/01/2012, 1:04 PM

## 2012-02-01 NOTE — Progress Notes (Signed)
Report to [place],RN to pre pare for tx 1303

## 2012-02-01 NOTE — Progress Notes (Signed)
PT Cancellation Note  Treatment cancelled today due to patient's refusal to participate.  Pt would not state reason, just declined therapy.  [name],[name] 02/01/2012, 12:22 PM Pager: [phone]

## 2012-02-02 DIAGNOSIS — R110 Nausea: Secondary | ICD-10-CM

## 2012-02-02 DIAGNOSIS — R410 Disorientation, unspecified: Secondary | ICD-10-CM | POA: Diagnosis not present

## 2012-02-02 LAB — GLUCOSE, CAPILLARY
Glucose-Capillary: 263 mg/dL — ABNORMAL HIGH (ref 70–99)
Glucose-Capillary: 224 mg/dL — ABNORMAL HIGH (ref 70–99)

## 2012-02-02 MED ORDER — HYDROMORPHONE HCL PF 1 MG/ML IJ SOLN
1.0000 mg | INTRAMUSCULAR | Status: DC | PRN
  Administered 2012-02-02: 1 mg via INTRAVENOUS
  Administered 2012-02-02: 0.5 mg via INTRAVENOUS
  Administered 2012-02-03 – 2012-02-05 (×12): 1 mg via INTRAVENOUS
  Filled 2012-02-02 (×14): qty 1

## 2012-02-02 MED ORDER — SODIUM CHLORIDE 0.9 % IV SOLN
INTRAVENOUS | Status: DC
  Administered 2012-02-02 – 2012-02-03 (×2): via INTRAVENOUS
  Administered 2012-02-04: 1000 mL via INTRAVENOUS

## 2012-02-02 MED ORDER — INSULIN GLARGINE 100 UNIT/ML ~~LOC~~ SOLN
20.0000 [IU] | Freq: Every day | SUBCUTANEOUS | Status: DC
  Administered 2012-02-02 – 2012-02-03 (×2): 20 [IU] via SUBCUTANEOUS

## 2012-02-02 MED ORDER — MORPHINE SULFATE ER 30 MG PO TBCR
30.0000 mg | EXTENDED_RELEASE_TABLET | Freq: Two times a day (BID) | ORAL | Status: DC
  Administered 2012-02-03 – 2012-02-08 (×12): 30 mg via ORAL
  Filled 2012-02-02 (×12): qty 1

## 2012-02-02 MED ORDER — DULOXETINE HCL 30 MG PO CPEP
30.0000 mg | ORAL_CAPSULE | Freq: Every day | ORAL | Status: DC
  Administered 2012-02-02 – 2012-02-08 (×7): 30 mg via ORAL
  Filled 2012-02-02 (×7): qty 1

## 2012-02-02 MED ORDER — ENSURE COMPLETE PO LIQD
237.0000 mL | Freq: Every day | ORAL | Status: DC
  Administered 2012-02-05: 237 mL via ORAL

## 2012-02-02 NOTE — Progress Notes (Signed)
Subjective: Patient is awake and alert.  Reports that she is the feeling poorly and that she always feels poorly.  She reports that she just wants someone to give her something so that she can die.  Is nauseous.  Continues to complain of pain.  Objective: Current vital signs: BP 167/52  Pulse 81  Temp 97.7 F (36.5 C) (Oral)  Resp 24  Ht 5\' 4"  (1.626 m)  Wt 67.132 kg (148 lb)  BMI 25.40 kg/m2  SpO2 92% Vital signs in last 24 hours: Temp:  [97.4 F (36.3 C)-99.1 F (37.3 C)] 97.7 F (36.5 C) (06/25 0538) Pulse Rate:  [81-87] 81  (06/25 0538) Resp:  [22-52] 24  (06/25 0538) BP: (161-172)/(52-81) 167/52 mmHg (06/25 0538) SpO2:  [92 %-98 %] 92 % (06/25 0538) FiO2 (%):  [2 %] 2 % (06/24 0900) Weight:  [67.132 kg (148 lb)] 67.132 kg (148 lb) (06/24 1210)  Intake/Output from previous day: 06/24 0701 - 06/25 0700 In: 225 [I.V.:225] Out: 755 [Urine:725; Emesis/NG output:30] Intake/Output this shift:   Nutritional status: Carb Control  Neurologic Exam: Mental Status:  Patient awake and alert.  Oriented.  Speech fluent.  Follows commands as she feels up to it.   Cranial Nerves:  II: visual fields grossly normal, pupils equal, round, reactive to light and accommodation  III,IV, VI: ptosis not present, extra-ocular motions intact bilaterally  V,VII: smile symmetric with decrease in left NLF, facial light touch sensation normal bilaterally  VIII: hearing normal bilaterally  IX,X: gag reflex present  XI: trapezius strength/neck flexion strength normal bilaterally  XII: tongue strength normal  Motor:  Moves all extremities weakly but equally Tone and bulk:normal tone throughout; no atrophy noted  Sensory: Light touch intact throughout, bilaterally  Deep Tendon Reflexes: 2+ in the upper extremities and absent in the lower extremities  Plantars:  Right: equivocal   Left: equivocal  Cerebellar:  Does not perform   Lab Results: Results for orders placed during the hospital  encounter of 01/26/12 (from the past 48 hour(s))  GLUCOSE, CAPILLARY     Status: Abnormal   Collection Time   01/31/12  8:25 AM      Component Value Range Comment   Glucose-Capillary 144 (*) 70 - 99 mg/dL   GLUCOSE, CAPILLARY     Status: Abnormal   Collection Time   01/31/12 12:01 PM      Component Value Range Comment   Glucose-Capillary 136 (*) 70 - 99 mg/dL   GLUCOSE, CAPILLARY     Status: Abnormal   Collection Time   01/31/12  4:47 PM      Component Value Range Comment   Glucose-Capillary 178 (*) 70 - 99 mg/dL   GLUCOSE, CAPILLARY     Status: Abnormal   Collection Time   01/31/12  7:37 PM      Component Value Range Comment   Glucose-Capillary 203 (*) 70 - 99 mg/dL    Comment 1 Documented in Chart      Comment 2 Notify RN     GLUCOSE, CAPILLARY     Status: Abnormal   Collection Time   01/31/12 11:53 PM      Component Value Range Comment   Glucose-Capillary 248 (*) 70 - 99 mg/dL    Comment 1 Documented in Chart      Comment 2 Notify RN     GLUCOSE, CAPILLARY     Status: Abnormal   Collection Time   02/01/12  3:26 AM      Component Value  Range Comment   Glucose-Capillary 274 (*) 70 - 99 mg/dL    Comment 1 Documented in Chart      Comment 2 Notify RN     CBC     Status: Normal   Collection Time   02/01/12  3:31 AM      Component Value Range Comment   WBC 7.7  4.0 - 10.5 K/uL    RBC 4.63  3.87 - 5.11 MIL/uL    Hemoglobin 12.5  12.0 - 15.0 g/dL    HCT 16.1  09.6 - 04.5 %    MCV 84.4  78.0 - 100.0 fL    MCH 27.0  26.0 - 34.0 pg    MCHC 32.0  30.0 - 36.0 g/dL    RDW 40.9  81.1 - 91.4 %    Platelets 232  150 - 400 K/uL   BASIC METABOLIC PANEL     Status: Abnormal   Collection Time   02/01/12  3:31 AM      Component Value Range Comment   Sodium 140  135 - 145 mEq/L REPEATED TO VERIFY   Potassium 4.1  3.5 - 5.1 mEq/L REPEATED TO VERIFY   Chloride 100  96 - 112 mEq/L REPEATED TO VERIFY   CO2 26  19 - 32 mEq/L REPEATED TO VERIFY   Glucose, Bld 288 (*) 70 - 99 mg/dL REPEATED TO  VERIFY   BUN 41 (*) 6 - 23 mg/dL REPEATED TO VERIFY   Creatinine, Ser 0.66  0.50 - 1.10 mg/dL    Calcium 9.7  8.4 - 78.2 mg/dL REPEATED TO VERIFY   GFR calc non Af Amer 84 (*) >90 mL/min    GFR calc Af Amer >90  >90 mL/min   GLUCOSE, CAPILLARY     Status: Abnormal   Collection Time   02/01/12  7:42 AM      Component Value Range Comment   Glucose-Capillary 261 (*) 70 - 99 mg/dL    Comment 1 Documented in Chart      Comment 2 Notify RN     GLUCOSE, CAPILLARY     Status: Abnormal   Collection Time   02/01/12 12:21 PM      Component Value Range Comment   Glucose-Capillary 272 (*) 70 - 99 mg/dL   GLUCOSE, CAPILLARY     Status: Abnormal   Collection Time   02/01/12  5:15 PM      Component Value Range Comment   Glucose-Capillary 270 (*) 70 - 99 mg/dL   GLUCOSE, CAPILLARY     Status: Abnormal   Collection Time   02/01/12  8:09 PM      Component Value Range Comment   Glucose-Capillary 266 (*) 70 - 99 mg/dL   GLUCOSE, CAPILLARY     Status: Abnormal   Collection Time   02/02/12 12:22 AM      Component Value Range Comment   Glucose-Capillary 319 (*) 70 - 99 mg/dL   GLUCOSE, CAPILLARY     Status: Abnormal   Collection Time   02/02/12  4:08 AM      Component Value Range Comment   Glucose-Capillary 277 (*) 70 - 99 mg/dL     Recent Results (from the past 240 hour(s))  URINE CULTURE     Status: Normal   Collection Time   01/26/12 11:30 PM      Component Value Range Status Comment   Specimen Description URINE, CATHETERIZED   Final    Special Requests NONE   Final  Culture  Setup Time [id number]   Final    Colony Count >=100,000 COLONIES/ML   Final    Culture ENTEROCOCCUS SPECIES   Final    Report Status 01/29/2012 FINAL   Final    Organism ID, Bacteria ENTEROCOCCUS SPECIES   Final   MRSA PCR SCREENING     Status: Normal   Collection Time   01/30/12  5:39 PM      Component Value Range Status Comment   MRSA by PCR NEGATIVE  NEGATIVE Final     Lipid Panel No results found for this  basename: CHOL,TRIG,HDL,CHOLHDL,VLDL,LDLCALC in the last 72 hours  Studies/Results: No results found.  Medications:  I have reviewed the patient's current medications. Scheduled:   . enoxaparin (LOVENOX) injection  40 mg Subcutaneous Q24H  . fentaNYL  25 mcg Transdermal Q72H  . insulin aspart  0-9 Units Subcutaneous Q4H  . insulin glargine  20 Units Subcutaneous Daily  . levofloxacin (LEVAQUIN) IV  250 mg Intravenous Q24H  . vitamin A & D      . DISCONTD: insulin glargine  10 Units Subcutaneous Daily    Assessment/Plan:  Patient Active Hospital Problem List:  Altered Mental Status (02/02/2012)   Assessment: Continues to show signs of depression.  On Cymbalta.  More cooperative than on initial evaluation and oriented.  From review of records though still with some periods of delirium.  Has had to remain on some narcotics although decreased to prevent withdrawal since on substantial doses as an outpatient.   Plan:  1.  EEG pending     LOS: 7 days   Thana Farr, MD [facility] [phone] 02/02/2012  7:40 AM

## 2012-02-02 NOTE — Clinical Social Work Note (Signed)
CSW received phone call from son, Tresa Endo. Long discussion had about discharge needs as Pt. Most likely will need SNF at d/c. Per son, Pt was not walking at all at home. He really felt his dad could no longer take care of Pt at home. CSW explained to son in detail the copay for [facility] and he stated that Pt had a very limited income, but that they could work something out for copays. CSW also explained how to apply for medicaid for Pt for SNF. CSW Tommi Emery to now follow Pt and will do FL2 and follow up with son. Vennie Homans, [place] 02/02/2012 11:10 AM [phone]

## 2012-02-02 NOTE — Progress Notes (Signed)
Physical Therapy Treatment Patient Details Name: LAASIA ARCOS MRN: [id number] DOB: Mar 09, 1933 Today's Date: 02/02/2012 Time: 4098-1191 PT Time Calculation (min): 16 min  PT Assessment / Plan / Recommendation Comments on Treatment Session  Pt has been declining/refusing PT past 2 attempts.  Pt requires MAX encouragement and increased time to decrease her high anxiety/fear of falling behavior. Took several min to get her to sit on the edge of the bed.  Pt was in a near panic.  Required MAX positive reinfircement and  reassurance she was not going to fall.  Pt sat EOB x 4 min at [employer] [facility].  Required + 2 assist to return to supine 2nd anxiety and increased c/o low back pain.  Pt plans to D/C to SNF.    Follow Up Recommendations  Skilled nursing facility    Barriers to Discharge        Equipment Recommendations  Defer to next venue    Recommendations for Other Services    Frequency Min 2X/week   Plan Discharge plan remains appropriate    Precautions / Restrictions     Pertinent Vitals/Pain MAX c/o back pain with activity    Mobility  Bed Mobility Bed Mobility: Supine to Sit;Sit to Supine Supine to Sit: 1: +1 Total assist Sit to Supine: 1: +2 Total assist;Other (comment) (had nursing student assist with sit to supine) Sit to Supine: Patient Percentage: 10% Details for Bed Mobility Assistance: MAX encouragement to patrticipate.  Pt demon MAX anxiety with the thought of moving beofre she even intitiates.  Pt required increased time and alot of positive reassurance to attempt any movement.  With [facility] elevated 45' and total assist using bed pad to scoot hips, pt did sit upright EOB x 4 min.  Again, MAX anxiety/fear of falling and increased c/o low back pain.   Transfers Transfers: Not assessed Details for Transfer Assistance: Unable to attempt 2nd MAX anxiety/near panic when discussed. Ambulation/Gait Ambulation/Gait Assistance: Not tested (comment) Ambulation/Gait Assistance  Details: Pt states she has NOT walked in several months     PT Goals progressing slowly    Visit Information  Last PT Received On: 02/02/12 Assistance Needed: +2                   End of Session PT - End of Session Activity Tolerance: Patient limited by pain;Other (comment) (self limiting)    Felecia Shelling  PTA [facility]  Acute  Rehab Pager     [phone]

## 2012-02-02 NOTE — Progress Notes (Signed)
CSW spoke with the son about skilled options. The son expressed some hesitance about the type of facility and if his mother would need placement. The son is okay with his mother being faxed out. The son identified financies as being an issues for the family and his mother not having any money. CSW will faxed the patient out and follow up on linking to financial services if needed an giving the son an opportunity to look into several facility and get any questions about facility payment answered.   Kayleen Memos. Leighton Ruff [phone]

## 2012-02-02 NOTE — Progress Notes (Signed)
Subjective: Still with pain and very agitated, believes she is attached to wires shooting things through her legs and that something is under her bed.  Objective: Vital signs in last 24 hours: Temp:  [97.4 F (36.3 C)-99.1 F (37.3 C)] 97.7 F (36.5 C) (06/25 0538) Pulse Rate:  [81-87] 81  (06/25 0538) Resp:  [22-52] 24  (06/25 0538) BP: (161-172)/(52-81) 167/52 mmHg (06/25 0538) SpO2:  [92 %-98 %] 92 % (06/25 0538) FiO2 (%):  [2 %] 2 % (06/24 0900) Weight:  [67.132 kg (148 lb)] 67.132 kg (148 lb) (06/24 1210) Weight change: -0.268 kg (-9.4 oz) Last BM Date: 01/30/12  Intake/Output from previous day: 06/24 0701 - 06/25 0700 In: 225 [I.V.:225] Out: 755 [Urine:725; Emesis/NG output:30] Intake/Output this shift:    General appearance: delirious Resp: clear to auscultation bilaterally Cardio: regular rate and rhythm, S1, S2 normal, no murmur, click, rub or gallop Extremities: extremities normal, atraumatic, no cyanosis or edema  Lab Results:  Basename 02/01/12 0331 01/31/12 0330  WBC 7.7 7.3  HGB 12.5 12.9  HCT 39.1 39.6  PLT 232 258   BMET  Basename 02/01/12 0331 01/31/12 0330  NA 140 138  K 4.1 4.8  CL 100 99  CO2 26 34*  GLUCOSE 288* 103*  BUN 41* 43*  CREATININE 0.66 1.03  CALCIUM 9.7 9.9    Studies/Results: No results found.  Medications: I have reviewed the patient's current medications.  Assessment/Plan: Principal Problem:  *Low back pain radiating to left leg, not controlled, discuss strategy with palliative care today, hydromorphone does provide some partial relief, will increase dose to 1 mg. Consider restart lyrica and retry morphine at lower dose? Active Problems:  Diabetes mellitus not well controlled.  Change IV to NS (no D5), increase lantus  UTI (lower urinary tract infection) last day of levaquin.  D/C foley and change to I/O cath with tomorrow  HTN (hypertension) some elevation but can be followed for now, control pain  Delirium  multifactorial secondary to pain and possibly some drug withdrawal.  Discuss with palliative care.   LOS: 7 days   [facility]   [phone] 02/02/2012, 7:10 AM

## 2012-02-02 NOTE — Progress Notes (Signed)
Pt has had 2 episodes of coffee ground emesis this Am. Total emesis output was 30mL. She c/o severe pain with labored breathing and is not easily consoled. 0.5mg  dilaudid Iv and 4mg  zofran IV administered with slight decrease in sx. Notified on call provider. Will continue to monitor. Eugene Garnet [facility] 02/02/2012 5:35 AM

## 2012-02-02 NOTE — Progress Notes (Signed)
Brief Nutrition Follow up Note  Patient asleep at time of RD visit. Spoke with patient's sister. She reported patient only ate one cup of apple sauce today. Patient continues to have poor PO intake. We discussed patient's snack preferences.   CMP     Component Value Date/Time   NA 140 02/01/2012 0331   K 4.1 02/01/2012 0331   CL 100 02/01/2012 0331   CO2 26 02/01/2012 0331   GLUCOSE 288* 02/01/2012 0331   BUN 41* 02/01/2012 0331   CREATININE 0.66 02/01/2012 0331   CALCIUM 9.7 02/01/2012 0331   PROT 6.5 01/30/2012 1655   ALBUMIN 2.7* 01/30/2012 1655   AST 14 01/30/2012 1655   ALT 11 01/30/2012 1655   ALKPHOS 84 01/30/2012 1655   BILITOT 0.3 01/30/2012 1655   GFRNONAA 84* 02/01/2012 0331   GFRAA >90 02/01/2012 0331     Intake/Output Summary (Last 24 hours) at 02/02/12 1537 Last data filed at 02/02/12 1454  Gross per 24 hour  Intake    300 ml  Output    580 ml  Net   -280 ml   Nutrition diagnosis: Inadequate oral intake, -Ongoing.   Intervention:  Will order patient snacks BID, per patient's preference to increase PO intake.   Will order patient Ensure nutrition supplement 1/d.   Goal/ Monitor: PO intake, weights , labs. 1. PO intake > 75% at meals.   Iven Finn [facility] [phone]

## 2012-02-02 NOTE — Progress Notes (Signed)
Subjective: Patient seen and examined, does not feel good.Pain is not adequately controlled, currently she is on fentanyl patch 25 mcg q 72 hrs for pain with prn dilaudid, Also c/o nausea Filed Vitals:   02/02/12 0538  BP: 167/52  Pulse: 81  Temp: 97.7 F (36.5 C)  Resp: 24    Chest: Clear Bilaterally Heart : S1S2 RRR Abdomen: Soft, nontender Ext : No edema Neuro: Alert, oriented x 3  A/P Chronic Pain (uncontrolled) Nausea Opiod withdrawl (Resolved)  Called and discussed with Dr Valentina Lucks, patient had been intolerant of most of opiods, he had tried oxycontin 40 mg bid at home which was discontinued  two weeks before coming to hospital,  he would again like to try the patient on long acting opiods, as fentanyl is not controlling the pain. Would d/c the fentanyl patch and restart MS contin at low dose of 30 mg po Q 12 hr. Will start tonight, so that effect of fentanyl wears off, as it can remain in body from 12-24 hrs after removal of patch.Continue Dialudid at 1 mg q 4 hr prn.  Would also recommend starting cymbalta 30 mg po daily for neuropathic pain. Continue Zofran prn for nausea.    Meredeth Ide Palliative Medicine Team [phone]
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
## 2012-02-03 DIAGNOSIS — R110 Nausea: Secondary | ICD-10-CM

## 2012-02-03 LAB — GLUCOSE, CAPILLARY
Glucose-Capillary: 133 mg/dL — ABNORMAL HIGH (ref 70–99)
Glucose-Capillary: 148 mg/dL — ABNORMAL HIGH (ref 70–99)
Glucose-Capillary: 177 mg/dL — ABNORMAL HIGH (ref 70–99)
Glucose-Capillary: 205 mg/dL — ABNORMAL HIGH (ref 70–99)
Glucose-Capillary: 230 mg/dL — ABNORMAL HIGH (ref 70–99)

## 2012-02-03 LAB — BASIC METABOLIC PANEL
CO2: 31 mEq/L (ref 19–32)
Chloride: 102 mEq/L (ref 96–112)
GFR calc non Af Amer: 86 mL/min — ABNORMAL LOW (ref >90)
Glucose, Bld: 233 mg/dL — ABNORMAL HIGH (ref 70–99)
Potassium: 2.7 mEq/L — CL (ref 3.5–5.1)
Sodium: 140 mEq/L (ref 135–145)

## 2012-02-03 MED ORDER — INSULIN ASPART 100 UNIT/ML ~~LOC~~ SOLN
0.0000 [IU] | Freq: Three times a day (TID) | SUBCUTANEOUS | Status: DC
  Administered 2012-02-03 (×2): 3 [IU] via SUBCUTANEOUS
  Administered 2012-02-03: 2 [IU] via SUBCUTANEOUS

## 2012-02-03 MED ORDER — SENNOSIDES-DOCUSATE SODIUM 8.6-50 MG PO TABS
2.0000 | ORAL_TABLET | Freq: Every day | ORAL | Status: DC
  Administered 2012-02-03 – 2012-02-07 (×5): 2 via ORAL
  Filled 2012-02-03 (×6): qty 2

## 2012-02-03 MED ORDER — POTASSIUM CHLORIDE CRYS ER 20 MEQ PO TBCR
20.0000 meq | EXTENDED_RELEASE_TABLET | Freq: Three times a day (TID) | ORAL | Status: AC
  Administered 2012-02-03 (×3): 20 meq via ORAL
  Filled 2012-02-03 (×3): qty 1

## 2012-02-03 MED ORDER — POLYETHYLENE GLYCOL 3350 17 G PO PACK
17.0000 g | PACK | Freq: Every day | ORAL | Status: DC
  Administered 2012-02-03 – 2012-02-08 (×4): 17 g via ORAL
  Filled 2012-02-03 (×6): qty 1

## 2012-02-03 NOTE — Progress Notes (Signed)
Subjective: Much more calm, oriented today, only mild pain at rest.  Po intake poor, no BM.  Objective: Vital signs in last 24 hours: Temp:  [98.1 F (36.7 C)-98.4 F (36.9 C)] 98.3 F (36.8 C) (06/26 0600) Pulse Rate:  [65-76] 76  (06/26 0600) Resp:  [18-24] 18  (06/26 0600) BP: (150-158)/(68-76) 150/68 mmHg (06/26 0600) SpO2:  [91 %-97 %] 91 % (06/26 0600) Weight change:  Last BM Date: 01/30/12  Intake/Output from previous day: 06/25 0701 - 06/26 0700 In: 1140 [P.O.:540; I.V.:600] Out: 1150 [Urine:1150] Intake/Output this shift: Total I/O In: 240 [P.O.:240] Out: 1000 [Urine:1000]  General appearance: alert and cooperative Resp: clear to auscultation bilaterally Cardio: regular rate and rhythm, S1, S2 normal, no murmur, click, rub or gallop Extremities: extremities normal, atraumatic, no cyanosis or edema  Lab Results:  [facility] 02/01/12 0331  WBC 7.7  HGB 12.5  HCT 39.1  PLT 232   BMET  Basename 02/01/12 0331  NA 140  K 4.1  CL 100  CO2 26  GLUCOSE 288*  BUN 41*  CREATININE 0.66  CALCIUM 9.7    Studies/Results: No results found.  Medications: I have reviewed the patient's current medications.  Assessment/Plan: Principal Problem:  *Low back pain radiating to left leg now on long acting morphine, dilaudid prn and cymbalta. Did receive 2 doses of dilaudid last night. Appears comfortable today at rest, will try to sit her up today. Add laxatives Active Problems:  Delirium, resolved this am.  Levaquin discontinued, was playing a role? Continue pain control efforts  Diabetes mellitus better controlled with increase in lantus and D/C D5  UTI (lower urinary tract infection) treated  Neurogenic Bladder, discontinue foley and start I/O clean cath qid  HTN (hypertension) holding diuretic as we rehydrate her, BP is acceptable  Disposition  NHP, not ready for discharge until better pain control with movement   LOS: 8 days   Dajana Gehrig JOSEPH 02/03/2012,  6:47 AM

## 2012-02-03 NOTE — Progress Notes (Signed)
Progress Note from the Palliative Medicine Team at [facility]  Subjective: pt alert and oriented X2, denies any back pain, but today reports nausea and abdominal pain (unable to scale), has green emesis bag in hand     Objective: Allergies  Allergen Reactions  . Neosporin (Neomycin-Bacitracin Zn-Polymyx) Rash  . Sulfa Antibiotics Rash   Scheduled Meds:   . DULoxetine  30 mg Oral Daily  . enoxaparin (LOVENOX) injection  40 mg Subcutaneous Q24H  . feeding supplement  237 mL Oral QPC supper  . insulin aspart  0-9 Units Subcutaneous TID WC  . insulin glargine  20 Units Subcutaneous Daily  . morphine  30 mg Oral Q12H  . polyethylene glycol  17 g Oral Daily  . potassium chloride  20 mEq Oral TID  . senna-docusate  2 tablet Oral QHS  . DISCONTD: insulin aspart  0-9 Units Subcutaneous Q4H   Continuous Infusions:   . sodium chloride 75 mL/hr at 02/03/12 1044   PRN Meds:.acetaminophen, HYDROmorphone (DILAUDID) injection, ondansetron (ZOFRAN) IV, ondansetron  BP 157/63  Pulse 78  Temp 98 F (36.7 C) (Oral)  Resp 18  Ht 5\' 4"  (1.626 m)  Wt 67.132 kg (148 lb)  BMI 25.40 kg/m2  SpO2 96%   PPS:30%    Intake/Output Summary (Last 24 hours) at 02/03/12 1505 Last data filed at 02/03/12 1300  Gross per 24 hour  Intake    720 ml  Output   1200 ml  Net   -480 ml       Physical Exam:  General: chronically ill appearing HEENT:  Dry mucous membranes Chest:   CTA CVS: RRR Abdomen:soft NT +BS Ext: without edema Neuro:oriented X2  Labs: CBC    Component Value Date/Time   WBC 7.7 02/01/2012 0331   RBC 4.63 02/01/2012 0331   HGB 12.5 02/01/2012 0331   HCT 39.1 02/01/2012 0331   PLT 232 02/01/2012 0331   MCV 84.4 02/01/2012 0331   MCH 27.0 02/01/2012 0331   MCHC 32.0 02/01/2012 0331   RDW 13.6 02/01/2012 0331   LYMPHSABS 1.1 01/30/2012 1655   MONOABS 0.9 01/30/2012 1655   EOSABS 0.2 01/30/2012 1655   BASOSABS 0.0 01/30/2012 1655    BMET    Component Value Date/Time   NA 140  02/03/2012 0620   K 2.7* 02/03/2012 0620   CL 102 02/03/2012 0620   CO2 31 02/03/2012 0620   GLUCOSE 233* 02/03/2012 0620   BUN 27* 02/03/2012 0620   CREATININE 0.60 02/03/2012 0620   CALCIUM 8.9 02/03/2012 0620   GFRNONAA 86* 02/03/2012 0620   GFRAA >90 02/03/2012 0620    CMP     Component Value Date/Time   NA 140 02/03/2012 0620   K 2.7* 02/03/2012 0620   CL 102 02/03/2012 0620   CO2 31 02/03/2012 0620   GLUCOSE 233* 02/03/2012 0620   BUN 27* 02/03/2012 0620   CREATININE 0.60 02/03/2012 0620   CALCIUM 8.9 02/03/2012 0620   PROT 6.5 01/30/2012 1655   ALBUMIN 2.7* 01/30/2012 1655   AST 14 01/30/2012 1655   ALT 11 01/30/2012 1655   ALKPHOS 84 01/30/2012 1655   BILITOT 0.3 01/30/2012 1655   GFRNONAA 86* 02/03/2012 0620   GFRAA >90 02/03/2012 0620        Assessment and Plan: 1. Code Status:Full code 2. Symptom Control:     -pain is well controlled on present medication doses  -nausea- per son this is an ongoing issue prior to this adm  consider scheduled antiemetic, PPI       -bring foods of choice from home if palatable to patient 3. Psycho/Social: placed call to son Hennie Gosa, emotional support offered, this is difficult for family 4. Spiritual: community church support 5. Disposition: probable dc to SNF for rehab, would recommend Palliative Care Services to follow   Lorinda Creed NP  [phone]     1

## 2012-02-03 NOTE — Progress Notes (Addendum)
Occupational Therapy Treatment and Goal Update Patient Details Name: [name] MRN: [id number] DOB: August 16, 1932 Today's Date: 02/03/2012 Time: 3664-4034 OT Time Calculation (min): 24 min  OT Assessment / Plan / Recommendation Comments on Treatment Session Pt very anxious fearful about falling.  Pt cooperative and tolerating activity much better.  Needs reinforcement with back precautions to minimize pain    Follow Up Recommendations  Skilled nursing facility    Barriers to Discharge       Equipment Recommendations  Defer to next venue    Recommendations for Other Services    Frequency Min 1X/week   Plan Discharge plan remains appropriate    Precautions / Restrictions Precautions Precautions: Back Restrictions Weight Bearing Restrictions: No   Pertinent Vitals/Pain Premedicated.  Initially no c/o pain.  After sitting 5 minutes, pt c/o bil LEs hurting:  Unable to rate    ADL  Grooming: Performed;Teeth care;Brushing hair;Minimal assistance Where Assessed - Grooming: Unsupported sitting Transfers/Ambulation Related to ADLs: sat EOB x 5 minutes with min guard A.   ADL Comments: Performed light grooming activities:  pt did 75% but decided her hair was good enough:  assist to position items/switch hand.     OT Diagnosis:    OT Problem List:   OT Treatment Interventions:     OT Goals Acute Rehab OT Goals Time For Goal Achievement: 02/17/12 Miscellaneous OT Goals Miscellaneous OT Goal #1: Pt will roll to bil sides, using log roll with mod A for ADLs OT Goal: Miscellaneous Goal #1 - Progress: Met Miscellaneous OT Goal #2: Pt will state 3/3 back precautions OT Goal: Miscellaneous Goal #2 - Progress: Progressing toward goals (continue goal) Miscellaneous OT Goal #3: Pt will tolerate sitting eob with A x 2 to achieve this and maintain with min guard for 3 minutes in prep for adls OT Goal: Miscellaneous Goal #3 - Progress: Met Miscellaneous OT Goal #4: Pt will sit EOB for 8  minutes performing light adl task with supervision OT Goal: Miscellaneous Goal #4 - Progress: Goal set today Miscellaneous OT Goal #5: Pt will roll to bil sides with rails with min A for adls OT Goal: Miscellaneous Goal #5 - Progress: Goal set today  Visit Information  Last OT Received On: 02/03/12 Assistance Needed: +2    Subjective Data      Prior Functioning       Cognition  Overall Cognitive Status: Appears within functional limits for tasks assessed/performed.  Difficulty making decisions/rating pain Arousal/Alertness: Awake/alert Orientation Level: Appears intact for tasks assessed Behavior During Session: Anxious (but wfls)    Mobility Bed Mobility Rolling Right: 3: Mod assist;With rail Rolling Left: 3: Mod assist;With rail Supine to Sit: 1: +2 Total assist Supine to Sit: Patient Percentage: 20% Details for Bed Mobility Assistance: rolled to bil sides to straighten pads and for sit to/from supine   Exercises    Balance Balance Balance Assessed: Yes Static Sitting Balance Static Sitting - Balance Support: No upper extremity supported Static Sitting - Level of Assistance: 5: Stand by assistance Static Sitting - Comment/# of Minutes: 5 minutes  End of Session OT - End of Session Activity Tolerance: Patient limited by fatigue;Patient limited by pain Patient left: in bed;with call bell/phone within reach  GO     [facility] 02/03/2012, 3:55 PM Marica Otter, OTR/L [phone] 02/03/2012

## 2012-02-03 NOTE — Plan of Care (Signed)
Problem: Phase II Progression Outcomes Goal: Progress activity as tolerated unless otherwise ordered Outcome: Progressing Slow progress.  Pt did tolerate sitting EOB for 5 minutes today with grooming tasks.  Rolling with mod A with bed rails

## 2012-02-03 NOTE — Progress Notes (Signed)
Patient's sister and spouse were in room.  Patient had 11 siblings--6 brothers and 5 sisters. The youngest is deceased. Patient was very uncomfortable today. Spouse was happy for support though.  02/03/12 1000  Clinical Encounter Type  Visited With Patient and family together  Visit Type Follow-up  Referral From Palliative care team  Recommendations Follow up  Spiritual Encounters  Spiritual Needs Emotional;Other (Comment) (Presence, listening)  Stress Factors  Patient Stress Factors Health changes (Pain, nausea)

## 2012-02-04 LAB — BASIC METABOLIC PANEL
Calcium: 8.6 mg/dL (ref 8.4–10.5)
GFR calc Af Amer: >90 mL/min (ref >90)
GFR calc non Af Amer: >90 mL/min (ref >90)
Glucose, Bld: 155 mg/dL — ABNORMAL HIGH (ref 70–99)
Sodium: 141 mEq/L (ref 135–145)

## 2012-02-04 LAB — GLUCOSE, CAPILLARY
Glucose-Capillary: 165 mg/dL — ABNORMAL HIGH (ref 70–99)
Glucose-Capillary: 170 mg/dL — ABNORMAL HIGH (ref 70–99)

## 2012-02-04 MED ORDER — INSULIN ASPART 100 UNIT/ML ~~LOC~~ SOLN
3.0000 [IU] | Freq: Three times a day (TID) | SUBCUTANEOUS | Status: DC
  Administered 2012-02-04 – 2012-02-08 (×2): 3 [IU] via SUBCUTANEOUS

## 2012-02-04 MED ORDER — POTASSIUM CHLORIDE 20 MEQ/15ML (10%) PO LIQD
20.0000 meq | Freq: Three times a day (TID) | ORAL | Status: DC
  Administered 2012-02-04 (×3): 20 meq via ORAL
  Filled 2012-02-04 (×6): qty 15

## 2012-02-04 MED ORDER — INSULIN ASPART 100 UNIT/ML ~~LOC~~ SOLN
0.0000 [IU] | Freq: Three times a day (TID) | SUBCUTANEOUS | Status: DC
  Administered 2012-02-04 (×2): 3 [IU] via SUBCUTANEOUS
  Administered 2012-02-04 – 2012-02-05 (×3): 5 [IU] via SUBCUTANEOUS
  Administered 2012-02-06: 2 [IU] via SUBCUTANEOUS
  Administered 2012-02-06: 3 [IU] via SUBCUTANEOUS
  Administered 2012-02-07: 8 [IU] via SUBCUTANEOUS
  Administered 2012-02-07: 11 [IU] via SUBCUTANEOUS
  Administered 2012-02-07: 2 [IU] via SUBCUTANEOUS
  Administered 2012-02-08: 15 [IU] via SUBCUTANEOUS
  Administered 2012-02-08: 5 [IU] via SUBCUTANEOUS

## 2012-02-04 MED ORDER — INSULIN GLARGINE 100 UNIT/ML ~~LOC~~ SOLN
25.0000 [IU] | Freq: Every day | SUBCUTANEOUS | Status: DC
  Administered 2012-02-04 – 2012-02-07 (×4): 25 [IU] via SUBCUTANEOUS

## 2012-02-04 MED ORDER — PANTOPRAZOLE SODIUM 40 MG IV SOLR
40.0000 mg | INTRAVENOUS | Status: DC
  Administered 2012-02-04 – 2012-02-05 (×2): 40 mg via INTRAVENOUS
  Filled 2012-02-04 (×3): qty 40

## 2012-02-04 NOTE — Progress Notes (Signed)
  CS[facility] consulted with [facility]. [facility] do not currently have any beds available, however have placed the patient on there list of accepting admissions. CSW consulted with the son, who was okay with that. The son informed that [facility] requests a few PT notes to be able to make there decision. In the mean time, CSW requested for the son, kelly to look at other facilities of choice to avoid any delays in discharge when patient is medically ready.  Kayleen Memos. Leighton Ruff [phone]

## 2012-02-04 NOTE — Progress Notes (Signed)
Physical Therapy Treatment Patient Details Name: [name] MRN: [id number] DOB: 10-24-1932 Today's Date: 02/04/2012 Time: 1450-1510 PT Time Calculation (min): 20 min  PT Assessment / Plan / Recommendation Comments on Treatment Session  Pt appears less anxious and nursing reports they got her OOB to the recliner earlier today.  Assisted pt OOB to WC squat pivot no AD @ Mod assist.  Pt still demon some anxiety but no where near the level she was last PT sessiopn.  Pt only agreed to trans from bed to the w/c then back to bed.  Offered to push pt in the hallway for a different scene and that's when she showed some panic. Assisted her back to bed. Pt plans to D/c to SNF.    Follow Up Recommendations  Skilled nursing facility    Barriers to Discharge        Equipment Recommendations  Defer to next venue    Recommendations for Other Services    Frequency Min 2X/week   Plan Discharge plan remains appropriate    Precautions / Restrictions     Pertinent Vitals/Pain "some" back pain with activity    Mobility  Bed Mobility Bed Mobility: Supine to Sit;Sit to Supine;Sitting - Scoot to Edge of Bed Supine to Sit: 3: Mod assist Sitting - Scoot to Edge of Bed: 2: Max assist Sit to Supine: 3: Mod assist Details for Bed Mobility Assistance: Performed much better with less c/o pain and much less anxiety Transfers Transfers: [employer] manager Transfers: 3: Mod assist Details for Transfer Assistance: bed to [facility] then back to bed, pt demon much less fear/anxiety.  Performed w/o RW, pt required 75% VC's on proper hand placement and trun completion. Ambulation/Gait Ambulation/Gait Assistance Details: Pt declined stating she has not walked in months and that her son pushes her in the w/c.       Visit Information  Last PT Received On: 02/04/12 Assistance Needed: +2                   End of Session PT - End of Session Equipment Utilized During Treatment: Gait belt Activity  Tolerance: Patient tolerated treatment well Patient left: in bed;with call bell/phone within reach   Felecia Shelling  PTA [facility]  Acute  Rehab Pager     [phone]

## 2012-02-04 NOTE — Progress Notes (Signed)
Subjective: Back pain may be some better, still some nausea and regurgitation, has history of reflux in the past.  Some pain in left upper chest.  Still asking for dilaudid regularly  Objective: Vital signs in last 24 hours: Temp:  [98 F (36.7 C)-99 F (37.2 C)] 99 F (37.2 C) (06/27 0510) Pulse Rate:  [77-80] 77  (06/27 0510) Resp:  [18] 18  (06/27 0510) BP: (157-164)/(63-83) 164/83 mmHg (06/27 0510) SpO2:  [94 %-96 %] 94 % (06/27 0510) Weight change:  Last BM Date: 01/30/12  Intake/Output from previous day: 06/26 0701 - 06/27 0700 In: 1305 [P.O.:480; I.V.:825] Out: 200 [Urine:200] Intake/Output this shift:    General appearance: alert and cooperative Resp: clear to auscultation bilaterally Chest wall: no tenderness, left sided chest wall tenderness Cardio: regular rate and rhythm, S1, S2 normal, no murmur, click, rub or gallop GI: soft, non-tender; bowel sounds normal; no masses,  no organomegaly Extremities: extremities normal, atraumatic, no cyanosis or edema  Lab Results: No results found for this basename: WBC:2,HGB:2,HCT:2,PLT:2 in the last 72 hours BMET  [facility] 02/04/12 0346 02/03/12 0620  NA 141 140  K 3.0* 2.7*  CL 103 102  CO2 31 31  GLUCOSE 155* 233*  BUN 20 27*  CREATININE 0.51 0.60  CALCIUM 8.6 8.9    Studies/Results: No results found.  Medications: I have reviewed the patient's current medications.  Assessment/Plan: Principal Problem:  *Low back pain radiating to left leg some improvement but still needing prn dilaudid.  Will leave to palliative care to titrate meds.  On laxatives, no bm.  Did sit up with OT Active Problems:  Diabetes mellitus fair control, increase Lantus and SSI with meal coverage UTI (lower urinary tract infection) treated  Neurogenic Bladder  I and O cath q8  HTN (hypertension) BP acceptable  Delirium resolved  Nausea/Regurgitation was on PPI at home prior to admission. Start IV protonix  Hypokalemia, replete po  Chest Pain doubt cardiac, ? Reflux vs musculoskeletal  Volume, repleted, D/C IVFs  Disposition  NHP early next week, have PT see again   LOS: 9 days   [facility]  [phone] 02/04/2012, 6:44 AM

## 2012-02-05 ENCOUNTER — Encounter (HOSPITAL_COMMUNITY): Payer: Self-pay | Admitting: *Deleted

## 2012-02-05 LAB — GLUCOSE, CAPILLARY
Glucose-Capillary: 117 mg/dL — ABNORMAL HIGH (ref 70–99)
Glucose-Capillary: 160 mg/dL — ABNORMAL HIGH (ref 70–99)
Glucose-Capillary: 202 mg/dL — ABNORMAL HIGH (ref 70–99)
Glucose-Capillary: 160 mg/dL — ABNORMAL HIGH (ref 70–99)

## 2012-02-05 LAB — BASIC METABOLIC PANEL
GFR calc Af Amer: >90 mL/min (ref >90)
GFR calc non Af Amer: 89 mL/min — ABNORMAL LOW (ref >90)
Glucose, Bld: 138 mg/dL — ABNORMAL HIGH (ref 70–99)
Potassium: 2.9 mEq/L — ABNORMAL LOW (ref 3.5–5.1)
Sodium: 140 mEq/L (ref 135–145)

## 2012-02-05 MED ORDER — POTASSIUM CHLORIDE CRYS ER 20 MEQ PO TBCR
20.0000 meq | EXTENDED_RELEASE_TABLET | Freq: Two times a day (BID) | ORAL | Status: DC
  Administered 2012-02-05 – 2012-02-07 (×6): 20 meq via ORAL
  Filled 2012-02-05 (×8): qty 1

## 2012-02-05 MED ORDER — MAGNESIUM OXIDE 400 (241.3 MG) MG PO TABS
400.0000 mg | ORAL_TABLET | Freq: Every day | ORAL | Status: DC
  Administered 2012-02-05 – 2012-02-07 (×3): 400 mg via ORAL
  Filled 2012-02-05 (×4): qty 1

## 2012-02-05 MED ORDER — ACETAMINOPHEN 325 MG PO TABS
650.0000 mg | ORAL_TABLET | Freq: Four times a day (QID) | ORAL | Status: DC | PRN
  Administered 2012-02-05 – 2012-02-08 (×2): 650 mg via ORAL
  Filled 2012-02-05 (×2): qty 2

## 2012-02-05 MED ORDER — MORPHINE SULFATE (CONCENTRATE) 20 MG/ML PO SOLN
5.0000 mg | ORAL | Status: DC | PRN
  Administered 2012-02-05 – 2012-02-08 (×6): 5 mg via ORAL
  Filled 2012-02-05 (×7): qty 1

## 2012-02-05 MED ORDER — BISACODYL 10 MG RE SUPP
10.0000 mg | Freq: Once | RECTAL | Status: AC
  Administered 2012-02-05: 10 mg via RECTAL
  Filled 2012-02-05: qty 1

## 2012-02-05 MED ORDER — POTASSIUM CHLORIDE 10 MEQ/100ML IV SOLN
10.0000 meq | INTRAVENOUS | Status: AC
  Administered 2012-02-05 (×2): 10 meq via INTRAVENOUS
  Filled 2012-02-05 (×6): qty 100

## 2012-02-05 MED ORDER — PANTOPRAZOLE SODIUM 40 MG PO TBEC
40.0000 mg | DELAYED_RELEASE_TABLET | Freq: Every day | ORAL | Status: DC
  Administered 2012-02-06 – 2012-02-08 (×3): 40 mg via ORAL
  Filled 2012-02-05 (×3): qty 1

## 2012-02-05 NOTE — Progress Notes (Signed)
The patient is receiving Protonix  by the intravenous route.  Based on criteria approved by the Pharmacy and Therapeutics Committee and the Medical Executive Committee, the medication is being converted to the equivalent oral dose form.  These criteria include: -No Active GI bleeding -Able to tolerate diet of full liquids (or better) or tube feeding OR able to tolerate other medications by the oral or enteral route  If you have any questions about this conversion, please contact the Pharmacy Department (ext [phone]).  Thank you.  Clydene Fake, [facility] 02/05/2012 11:17 AM

## 2012-02-05 NOTE — Progress Notes (Signed)
Progress Note from the Palliative Medicine Team at [facility]  Subjective: patient alert and oriented, continues to hold onto green emesis bag,(only spitting)    Objective: Allergies  Allergen Reactions  . Neosporin (Neomycin-Bacitracin Zn-Polymyx) Rash  . Sulfa Antibiotics Rash   Scheduled Meds:   . bisacodyl  10 mg Rectal Once  . DULoxetine  30 mg Oral Daily  . enoxaparin (LOVENOX) injection  40 mg Subcutaneous Q24H  . feeding supplement  237 mL Oral QPC supper  . insulin aspart  0-15 Units Subcutaneous TID WC  . insulin aspart  3 Units Subcutaneous TID WC  . insulin glargine  25 Units Subcutaneous Daily  . morphine  30 mg Oral Q12H  . pantoprazole (PROTONIX) IV  40 mg Intravenous Q24H  . polyethylene glycol  17 g Oral Daily  . potassium chloride  10 mEq Intravenous Q1 Hr x 4  . potassium chloride  20 mEq Oral BID  . senna-docusate  2 tablet Oral QHS  . DISCONTD: potassium chloride  20 mEq Oral TID   Continuous Infusions:  PRN Meds:.acetaminophen, morphine, ondansetron (ZOFRAN) IV, ondansetron, DISCONTD:  HYDROmorphone (DILAUDID) injection  BP 163/78  Pulse 70  Temp 98.5 F (36.9 C) (Oral)  Resp 16  Ht 5\' 4"  (1.626 m)  Wt 67.132 kg (148 lb)  BMI 25.40 kg/m2  SpO2 94%   PPS: 30% at best  Pain Score:unable to verbalize scale Pain Location lower back   Intake/Output Summary (Last 24 hours) at 02/05/12 1002 Last data filed at 02/05/12 0230  Gross per 24 hour  Intake    120 ml  Output    300 ml  Net   -180 ml       Physical Exam:  General: chronically ill appearing, NAD HEENT:  mosit mucous membranes Chest:   RRR CVS: CTA Abdomen:soft NT +BS Ext: without edeam Neuro:alert and oriented  Psych: pt flat and non committal when asked to engage in her plan of care, usual answer is "I don't know"   Labs: CBC    Component Value Date/Time   WBC 7.7 02/01/2012 0331   RBC 4.63 02/01/2012 0331   HGB 12.5 02/01/2012 0331   HCT 39.1 02/01/2012 0331   PLT 232  02/01/2012 0331   MCV 84.4 02/01/2012 0331   MCH 27.0 02/01/2012 0331   MCHC 32.0 02/01/2012 0331   RDW 13.6 02/01/2012 0331   LYMPHSABS 1.1 01/30/2012 1655   MONOABS 0.9 01/30/2012 1655   EOSABS 0.2 01/30/2012 1655   BASOSABS 0.0 01/30/2012 1655    BMET    Component Value Date/Time   NA 140 02/05/2012 0337   K 2.9* 02/05/2012 0337   CL 102 02/05/2012 0337   CO2 32 02/05/2012 0337   GLUCOSE 138* 02/05/2012 0337   BUN 18 02/05/2012 0337   CREATININE 0.55 02/05/2012 0337   CALCIUM 8.7 02/05/2012 0337   GFRNONAA 89* 02/05/2012 0337   GFRAA >90 02/05/2012 0337    CMP     Component Value Date/Time   NA 140 02/05/2012 0337   K 2.9* 02/05/2012 0337   CL 102 02/05/2012 0337   CO2 32 02/05/2012 0337   GLUCOSE 138* 02/05/2012 0337   BUN 18 02/05/2012 0337   CREATININE 0.55 02/05/2012 0337   CALCIUM 8.7 02/05/2012 0337   PROT 6.5 01/30/2012 1655   ALBUMIN 2.7* 01/30/2012 1655   AST 14 01/30/2012 1655   ALT 11 01/30/2012 1655   ALKPHOS 84 01/30/2012 1655   BILITOT 0.3 01/30/2012 1655  GFRNONAA 89* 02/05/2012 0337   GFRAA >90 02/05/2012 0337      1  Assessment and Plan: 1. Code Status:Full code 2. Symptom Control: PAIN: used only two doses of Dilaudid in 24 hr period for breakthrough, will dc IV meds and shift to full oral regime, will add Roxanol  Prn ( this will be easily to swallow than more pills), encouraged nursing to utilize verbal comfort que and positioning    Anxiety:  I feel there is an anxiety component to this situation, would consider a low dose scheduled anti       anxiety medication  3. Psycho/Social: emotional support to husband at bedside 4. Spiritual: strong community church support 5. Disposition: looking to SNF for rehab  Lorinda Creed NP  [phone]

## 2012-02-05 NOTE — Progress Notes (Signed)
Nutrition Follow-up Received consult to see pt regarding dietary preferences.  Intervention:  Encourage PO intake at meals. Will order snacks for pt per pt preferences. Will add mechanical soft to diet order.   Diet Order:  Carbohydrate Modified, Medium Calorie (5-20% PO intake documented since last seen by RD)  Discussed snack preferences with pt and family. Pt also would like sign placed on door for food service to remove lid from meal tray prior to entering. Pt family stated pt's appetite was decreased PTA but has decreased further during addmision. Pt also stated that all the foods she orders from the kitchen are too hard and expressed interest in mechanical soft diet.   Meds: Scheduled Meds:   . bisacodyl  10 mg Rectal Once  . DULoxetine  30 mg Oral Daily  . enoxaparin (LOVENOX) injection  40 mg Subcutaneous Q24H  . feeding supplement  237 mL Oral QPC supper  . insulin aspart  0-15 Units Subcutaneous TID WC  . insulin aspart  3 Units Subcutaneous TID WC  . insulin glargine  25 Units Subcutaneous Daily  . morphine  30 mg Oral Q12H  . pantoprazole (PROTONIX) IV  40 mg Intravenous Q24H  . polyethylene glycol  17 g Oral Daily  . potassium chloride  10 mEq Intravenous Q1 Hr x 4  . potassium chloride  20 mEq Oral BID  . senna-docusate  2 tablet Oral QHS  . DISCONTD: potassium chloride  20 mEq Oral TID   Continuous Infusions:  PRN Meds:.acetaminophen, HYDROmorphone (DILAUDID) injection, ondansetron (ZOFRAN) IV, ondansetron  Labs:  CMP     Component Value Date/Time   NA 140 02/05/2012 0337   K 2.9* 02/05/2012 0337   CL 102 02/05/2012 0337   CO2 32 02/05/2012 0337   GLUCOSE 138* 02/05/2012 0337   BUN 18 02/05/2012 0337   CREATININE 0.55 02/05/2012 0337   CALCIUM 8.7 02/05/2012 0337   PROT 6.5 01/30/2012 1655   ALBUMIN 2.7* 01/30/2012 1655   AST 14 01/30/2012 1655   ALT 11 01/30/2012 1655   ALKPHOS 84 01/30/2012 1655   BILITOT 0.3 01/30/2012 1655   GFRNONAA 89* 02/05/2012 0337   GFRAA >90  02/05/2012 0337     Intake/Output Summary (Last 24 hours) at 02/05/12 0937 Last data filed at 02/05/12 0230  Gross per 24 hour  Intake    120 ml  Output    580 ml  Net   -460 ml    Weight Status:  67.1 kg (02/01/12); no new weight  Nutrition Dx:  Inadequate oral intake, -Ongoing.  Goal:  PO intake > 75% at meals.  Monitor:  PO intake, weights , labs.

## 2012-02-05 NOTE — Progress Notes (Signed)
Subjective: Still taking frequent doses of dilaudid iv.  Did work with PT yesterday.  Still some nausea, not eating much, doesn't like food. No BM. Doesn't tolerate liquid KCL  Objective: Vital signs in last 24 hours: Temp:  [97.2 F (36.2 C)-98.5 F (36.9 C)] 98.5 F (36.9 C) (06/28 0650) Pulse Rate:  [70-77] 70  (06/28 0650) Resp:  [16-18] 16  (06/28 0650) BP: (148-163)/(69-86) 163/78 mmHg (06/28 0650) SpO2:  [94 %-96 %] 94 % (06/28 0650) Weight change:  Last BM Date: 01/30/12  Intake/Output from previous day: 06/27 0701 - 06/28 0700 In: 240 [P.O.:240] Out: 580 [Urine:580] Intake/Output this shift:    General appearance: alert and cooperative Resp: clear to auscultation bilaterally Cardio: regular rate and rhythm, S1, S2 normal, no murmur, click, rub or gallop GI: soft, non-tender; bowel sounds normal; no masses,  no organomegaly Extremities: extremities normal, atraumatic, no cyanosis or edema  Lab Results: No results found for this basename: WBC:2,HGB:2,HCT:2,PLT:2 in the last 72 hours BMET  [facility] 02/05/12 0337 02/04/12 0346  NA 140 141  K 2.9* 3.0*  CL 102 103  CO2 32 31  GLUCOSE 138* 155*  BUN 18 20  CREATININE 0.55 0.51  CALCIUM 8.7 8.6    Studies/Results: No results found.  Medications: I have reviewed the patient's current medications.  Assessment/Plan: Principal Problem:  *Low back pain radiating to left leg definitely improved but still requiring frequent doses of dilaudid, defer adjustments to regimen to palliative care, will need all orals on discharge to NH.  Give dulcolax for BM. Did make progress with PT and OOB yesterday Active Problems:  Diabetes mellitus reasonable control, no change  Neurogenic Bladder  I/O cath q8  HTN (hypertension) holding BP meds  Hypokalemia, replete po and IV, check magnesium GER/Nausea  PPI started, zofran prn  FEN  Ask nutritionist to see re: dietary preferences  Disposition  NHP for rehab next week, Would like  PT to see again this weekend   LOS: 10 days   Romero Letizia JOSEPH 02/05/2012, 7:12 AM

## 2012-02-06 DIAGNOSIS — F411 Generalized anxiety disorder: Secondary | ICD-10-CM

## 2012-02-06 LAB — GLUCOSE, CAPILLARY
Glucose-Capillary: 142 mg/dL — ABNORMAL HIGH (ref 70–99)
Glucose-Capillary: 159 mg/dL — ABNORMAL HIGH (ref 70–99)

## 2012-02-06 LAB — BASIC METABOLIC PANEL
CO2: 31 mEq/L (ref 19–32)
Chloride: 99 mEq/L (ref 96–112)
Creatinine, Ser: 0.55 mg/dL (ref 0.50–1.10)
Potassium: 3.7 mEq/L (ref 3.5–5.1)

## 2012-02-06 MED ORDER — POTASSIUM CHLORIDE 10 MEQ/100ML IV SOLN
10.0000 meq | INTRAVENOUS | Status: AC
  Administered 2012-02-06 (×2): 10 meq via INTRAVENOUS
  Filled 2012-02-06 (×2): qty 100

## 2012-02-06 MED ORDER — PROMETHAZINE HCL 25 MG/ML IJ SOLN
12.5000 mg | Freq: Four times a day (QID) | INTRAMUSCULAR | Status: DC | PRN
  Administered 2012-02-06 (×2): 12.5 mg via INTRAVENOUS
  Filled 2012-02-06 (×2): qty 1

## 2012-02-06 MED ORDER — LORAZEPAM 0.5 MG PO TABS
0.5000 mg | ORAL_TABLET | Freq: Three times a day (TID) | ORAL | Status: DC
  Administered 2012-02-06 – 2012-02-08 (×4): 0.5 mg via ORAL
  Filled 2012-02-06 (×4): qty 1

## 2012-02-06 MED ORDER — BIOTENE DRY MOUTH MT LIQD
15.0000 mL | Freq: Two times a day (BID) | OROMUCOSAL | Status: DC
  Administered 2012-02-06 – 2012-02-08 (×4): 15 mL via OROMUCOSAL

## 2012-02-06 NOTE — Progress Notes (Signed)
Pt is nauseated and vomiting. Unresponsive to zofran. I have paged the physician on call twice since 1 o'clock this morning and have not heard back.

## 2012-02-06 NOTE — Progress Notes (Signed)
Subjective: Still experiencing low back and leg pain.  However, having more trouble with nausea and vomiting. Did have small bowel movement yesterday  Objective: Vital signs in last 24 hours: Temp:  [98.2 F (36.8 C)-98.7 F (37.1 C)] 98.2 F (36.8 C) (06/29 0433) Pulse Rate:  [76-110] 110  (06/29 0433) Resp:  [16-20] 20  (06/29 0433) BP: (149-158)/(62-91) 158/91 mmHg (06/29 0433) SpO2:  [94 %-95 %] 95 % (06/29 0433) Weight change:  Last BM Date: 02/05/12  Intake/Output from previous day: 06/28 0701 - 06/29 0700 In: 240 [P.O.:240] Out: 650 [Urine:650] Intake/Output this shift:    Resp: clear to auscultation bilaterally Cardio: regular rate and rhythm, S1, S2 normal, no murmur, click, rub or gallop GI: soft, non-tender; bowel sounds normal; no masses,  no organomegaly and mild tenderness left side of abdomen  Lab Results: No results found for this basename: WBC:2,HGB:2,HCT:2,PLT:2 in the last 72 hours BMET  [facility] 02/06/12 0349 02/05/12 0337  NA 137 140  K 3.7 2.9*  CL 99 102  CO2 31 32  GLUCOSE 145* 138*  BUN 19 18  CREATININE 0.55 0.55  CALCIUM 8.9 8.7    Studies/Results: No results found.  Medications: I have reviewed the patient's current medications.  Assessment/Plan: HTN good control Nausea likely secondary to dilaudid, await input from palliative care DM good control Potassium now normal Await NHP  LOS: 11 days   [name] 02/06/2012, 7:12 AM

## 2012-02-06 NOTE — Progress Notes (Signed)
Progress Note from the Palliative Medicine Team at [facility]  Subjective: pt alert and oriented, husband at bedside, still with green emesis bag in hand     Objective: Allergies  Allergen Reactions  . Neosporin (Neomycin-Bacitracin Zn-Polymyx) Rash  . Sulfa Antibiotics Rash   Scheduled Meds:   . antiseptic oral rinse  15 mL Mouth Rinse BID  . bisacodyl  10 mg Rectal Once  . DULoxetine  30 mg Oral Daily  . enoxaparin (LOVENOX) injection  40 mg Subcutaneous Q24H  . feeding supplement  237 mL Oral QPC supper  . insulin aspart  0-15 Units Subcutaneous TID WC  . insulin aspart  3 Units Subcutaneous TID WC  . insulin glargine  25 Units Subcutaneous Daily  . LORazepam  0.5 mg Oral Q8H  . magnesium oxide  400 mg Oral Daily  . morphine  30 mg Oral Q12H  . pantoprazole  40 mg Oral Q1200  . polyethylene glycol  17 g Oral Daily  . potassium chloride  10 mEq Intravenous Q1 Hr x 4  . potassium chloride  10 mEq Intravenous Q1 Hr x 4  . potassium chloride  20 mEq Oral BID  . senna-docusate  2 tablet Oral QHS   Continuous Infusions:  PRN Meds:.acetaminophen, morphine, ondansetron (ZOFRAN) IV, ondansetron, promethazine  BP 158/91  Pulse 110  Temp 98.2 F (36.8 C) (Axillary)  Resp 20  Ht 5\' 4"  (1.626 m)  Wt 67.132 kg (148 lb)  BMI 25.40 kg/m2  SpO2 95%   PPS:30%  Pain Score:denies Pain Locationadmitted with low back pain   Intake/Output Summary (Last 24 hours) at 02/06/12 1133 Last data filed at 02/05/12 2030  Gross per 24 hour  Intake    120 ml  Output    350 ml  Net   -230 ml      LBM: 02-06-12    Stool Softner:yes  Physical Exam:  General: chronically ill appearing, NAD HEENT:  mosit mucosu membranes Chest:   CTA CVS: RRR Abdomen:soft NT +BS Ext: without edema Neuro:flt, husband reports this is her baseleine  Labs: CBC    Component Value Date/Time   WBC 7.7 02/01/2012 0331   RBC 4.63 02/01/2012 0331   HGB 12.5 02/01/2012 0331   HCT 39.1 02/01/2012 0331   PLT  232 02/01/2012 0331   MCV 84.4 02/01/2012 0331   MCH 27.0 02/01/2012 0331   MCHC 32.0 02/01/2012 0331   RDW 13.6 02/01/2012 0331   LYMPHSABS 1.1 01/30/2012 1655   MONOABS 0.9 01/30/2012 1655   EOSABS 0.2 01/30/2012 1655   BASOSABS 0.0 01/30/2012 1655    BMET    Component Value Date/Time   NA 137 02/06/2012 0349   K 3.7 02/06/2012 0349   CL 99 02/06/2012 0349   CO2 31 02/06/2012 0349   GLUCOSE 145* 02/06/2012 0349   BUN 19 02/06/2012 0349   CREATININE 0.55 02/06/2012 0349   CALCIUM 8.9 02/06/2012 0349   GFRNONAA 89* 02/06/2012 0349   GFRAA >90 02/06/2012 0349    CMP     Component Value Date/Time   NA 137 02/06/2012 0349   K 3.7 02/06/2012 0349   CL 99 02/06/2012 0349   CO2 31 02/06/2012 0349   GLUCOSE 145* 02/06/2012 0349   BUN 19 02/06/2012 0349   CREATININE 0.55 02/06/2012 0349   CALCIUM 8.9 02/06/2012 0349   PROT 6.5 01/30/2012 1655   ALBUMIN 2.7* 01/30/2012 1655   AST 14 01/30/2012 1655   ALT 11 01/30/2012 1655   ALKPHOS  84 01/30/2012 1655   BILITOT 0.3 01/30/2012 1655   GFRNONAA 89* 02/06/2012 0349   GFRAA >90 02/06/2012 0349         Assessment and Plan: 1. Code Status: Full code 2. Symptom Control:      Pain: Pain is managed at this time, Dilaudid was stopped yesterday morning, now utilizing Roxanol for      breakthrough pain (required only 3 doses in 24 hour period) no need to increase ER Morphine      Nausea: per family this has been an ongoing problem, prior to admission,(pt does much better with encouragement and I feel there is an anxiety component      Anxiety: Ativan 0.5 mg every 8 hours   3. Psycho/Social: emotional support offered to patient and husband at bedside, spoke to [place] (son) by phone, attempted to answer questions and address concerns in this difficult situation, he is questioning if perhaps his mother's main focus is comfort not cure 4. Spiritual: [employer] support 5. Disposition:family is hopeful for SNF for rehab    Time In Time Out Total Time Spent  with Patient Total Overall Time  1115 1200 35 min 45 min    Greater than 50%  of this time was spent counseling and coordinating care related to the above assessment and plan.  Lorinda Creed NP  [phone]   1

## 2012-02-07 LAB — GLUCOSE, CAPILLARY
Glucose-Capillary: 276 mg/dL — ABNORMAL HIGH (ref 70–99)
Glucose-Capillary: 230 mg/dL — ABNORMAL HIGH (ref 70–99)

## 2012-02-07 MED ORDER — MORPHINE SULFATE (CONCENTRATE) 20 MG/ML PO SOLN
5.0000 mg | Freq: Once | ORAL | Status: AC
  Administered 2012-02-07: 5 mg via ORAL

## 2012-02-07 NOTE — Progress Notes (Signed)
Pt awakens again complaining of pain in left breast chest area still. States I am going to need something else for pain No diaphoresis, denies anxiety, no nausea, heart sounds unchanged from initial assessment. Vital signs taken and charted by NT. . Dr. Pete Glatter notified verbally by phone of complaints of pain left breast/chest area. Rates 8/10. Notified of vital signs, elevated BP ranges, and prior given dose of Roxanol. New order given for pain control. Will follow.

## 2012-02-07 NOTE — Progress Notes (Signed)
Scheduled 0600 Ativan dose moved out to 0800 to monitor for any possible sedative effects of additional one time Roxanol dose given for pain control.

## 2012-02-07 NOTE — Progress Notes (Signed)
Subjective: The patient is alert, pleasant better night  Objective: Vital signs in last 24 hours: Temp:  [98.3 F (36.8 C)-99 F (37.2 C)] 98.6 F (37 C) (06/30 0519) Pulse Rate:  [79-97] 97  (06/30 0519) Resp:  [18] 18  (06/30 0519) BP: (160-193)/(78-83) 160/78 mmHg (06/30 0519) SpO2:  [94 %-97 %] 97 % (06/30 0519) Weight change:  Last BM Date: 02/05/12  Intake/Output from previous day: 06/29 0701 - 06/30 0700 In: 640 [P.O.:640] Out: 1050 [Urine:1050] Intake/Output this shift:    Resp: clear to auscultation bilaterally Cardio: regular rate and rhythm, S1, S2 normal, no murmur, click, rub or gallop  Lab Results: No results found for this basename: WBC:2,HGB:2,HCT:2,PLT:2 in the last 72 hours BMET  [facility] 02/06/12 0349 02/05/12 0337  NA 137 140  K 3.7 2.9*  CL 99 102  CO2 31 32  GLUCOSE 145* 138*  BUN 19 18  CREATININE 0.55 0.55  CALCIUM 8.9 8.7    Studies/Results: No results found.  Medications:  Scheduled:   . antiseptic oral rinse  15 mL Mouth Rinse BID  . DULoxetine  30 mg Oral Daily  . enoxaparin (LOVENOX) injection  40 mg Subcutaneous Q24H  . feeding supplement  237 mL Oral QPC supper  . insulin aspart  0-15 Units Subcutaneous TID WC  . insulin aspart  3 Units Subcutaneous TID WC  . insulin glargine  25 Units Subcutaneous Daily  . LORazepam  0.5 mg Oral Q8H  . magnesium oxide  400 mg Oral Daily  . morphine  30 mg Oral Q12H  . morphine  5 mg Oral Once  . pantoprazole  40 mg Oral Q1200  . polyethylene glycol  17 g Oral Daily  . potassium chloride  20 mEq Oral BID  . senna-docusate  2 tablet Oral QHS    Assessment/Plan: 1. Htn fair control 2. Nausea much better off dilaudid 3. DM cbg 160 fair control 4.back pain better  LOS: 12 days   [facility] 02/07/2012, 8:42 AM

## 2012-02-07 NOTE — Progress Notes (Signed)
Pt reports pain in epigastric area and left breast tissue. No diaphoresis. Denies SOB. Reports pain is a 8/10 scale. Reports she also has coinciding pain in her upper back as well. Will follow.

## 2012-02-07 NOTE — Progress Notes (Signed)
Pt calm and comfortable. Resting with eyes closed. No signs of distress at this point. Will continue to follow.

## 2012-02-08 LAB — GLUCOSE, CAPILLARY: Glucose-Capillary: 373 mg/dL — ABNORMAL HIGH (ref 70–99)

## 2012-02-08 MED ORDER — MORPHINE SULFATE ER 30 MG PO TBCR: 30.0000 mg | EXTENDED_RELEASE_TABLET | Freq: Two times a day (BID) | ORAL | Status: DC

## 2012-02-08 MED ORDER — BIOTENE DRY MOUTH MT LIQD: 15.0000 mL | Freq: Two times a day (BID) | OROMUCOSAL | Status: DC

## 2012-02-08 MED ORDER — INSULIN GLARGINE 100 UNIT/ML ~~LOC~~ SOLN: 30.0000 [IU] | Freq: Every day | SUBCUTANEOUS | Status: DC

## 2012-02-08 MED ORDER — POLYETHYLENE GLYCOL 3350 17 G PO PACK: 17.0000 g | PACK | Freq: Every day | ORAL | Status: AC

## 2012-02-08 MED ORDER — DULOXETINE HCL 30 MG PO CPEP: 30.0000 mg | ORAL_CAPSULE | Freq: Every day | ORAL | Status: DC

## 2012-02-08 MED ORDER — MORPHINE SULFATE (CONCENTRATE) 20 MG/ML PO SOLN: 5.0000 mg | ORAL | Status: DC | PRN

## 2012-02-08 MED ORDER — LORAZEPAM 0.5 MG PO TABS: 0.5000 mg | ORAL_TABLET | Freq: Three times a day (TID) | ORAL | Status: AC

## 2012-02-08 MED ORDER — PANTOPRAZOLE SODIUM 40 MG PO TBEC: 40.0000 mg | DELAYED_RELEASE_TABLET | Freq: Every day | ORAL | Status: DC

## 2012-02-08 MED ORDER — INSULIN ASPART 100 UNIT/ML ~~LOC~~ SOLN: 3.0000 [IU] | Freq: Three times a day (TID) | SUBCUTANEOUS | Status: DC

## 2012-02-08 MED ORDER — SENNOSIDES-DOCUSATE SODIUM 8.6-50 MG PO TABS: 2.0000 | ORAL_TABLET | Freq: Every day | ORAL | Status: AC

## 2012-02-08 MED ORDER — INSULIN GLARGINE 100 UNIT/ML ~~LOC~~ SOLN
30.0000 [IU] | Freq: Every day | SUBCUTANEOUS | Status: DC
  Administered 2012-02-08: 30 [IU] via SUBCUTANEOUS

## 2012-02-08 MED ORDER — ONDANSETRON HCL 4 MG PO TABS: 4.0000 mg | ORAL_TABLET | Freq: Four times a day (QID) | ORAL | Status: AC | PRN

## 2012-02-08 MED ORDER — ACETAMINOPHEN 325 MG PO TABS: 650.0000 mg | ORAL_TABLET | Freq: Four times a day (QID) | ORAL | Status: DC | PRN

## 2012-02-08 NOTE — Discharge Summary (Signed)
Physician Discharge Summary  Patient ID: [name] MRN: [id number] DOB/AGE: Apr 15, 1933 78 y.o.  Admit date: 01/26/2012 Discharge date: 02/08/2012  Admission Diagnoses:lumbar radiculopathy/spinal stenosis Diabetes mellitus UTI Neurogenic bladder Hypertension Gastroesophageal reflux disease  Discharge Diagnoses:  Principal Problem:  *Low back pain radiating to left leg Active Problems:  Diabetes mellitus  UTI (lower urinary tract infection) Neurogenic bladder  HTN (hypertension)  Delirium Gastroesophageal reflux disease Nausea   Discharged Condition: fair  Hospital Course: the patient was admitted on June 19 with severe low back pain radiating to legs secondary to known lumbar degenerative disc disease and spinal stenosis. The patient's pain was uncontrolled at home despite efforts with OxyContin and then fentanyl transdermal patch and steroid injections.the patient was admitted and started on higher dose of fentanyl patch.she was seen by Dr. Newell Coral of the neurosurgery service his evaluation with disabling neurogenic claudication secondary to multilevel and multifactorial lumbar stenosis or marked L3-4 and severe at the L4-5 level. Surgical intervention would involve a major surgery with L3-L5 decompressive lumbar laminectomy and fusion.after discussion of the possible complications and prolonged recovery period we opted to continue conservative care and pain control with surgery as a last resort  an option in the future if pain control cannot be achieved.  a palliative care consult obtained for help with pain control. Fentanyl was discontinued and the patient was placed on morphine sulfate 100 mg every 12 hours. The patient was also treated with lyrica for neurogenic pain. Because of morphine proved to be too high and the patient did develop respiratory depression requiring Narcan x2 and a one-day stay in the ICU with close observation. All narcotics were briefly stopped. The patient  did develop delirium which is likely multifactorial secondary to pain medications and medication withdrawal as well as possibly Levaquin which was used for UTI. The delirium cleared within 2 days the patient's mental status has been at baseline since. The patient was restarted on morphine at a much lower dose 30 mg every 12 hours which is tolerated for a well and this provided good pain control. She was initially treated with Dilaudid IV for breakthrough pain but this then switched over to Roxanol as needed. She was also started on Cymbalta at a low dose for both neurogenic pain and depression. At discharge her pain was under reasonable control especially at rest although still some pain with movement and she has begun working with physical therapy. the patient did have a UTI at admission which grew out enterococcus and culture. She was initially treated with amoxicillin but when she develop delirium was switched to Levaquin to complete her course of 7 days.she was seen by the urology service including her urologist Dr. Patsi Sears his assessment was hypotonic neurogenic and probably areflexic bladder with inability to voluntarily void but fixed bladder outlet allowing some flow incontinence.  clean catheterization 3 times a day was recommended as well as control of constipation and consider trimethoprim 100 mg a day for UTI suppression.and the patient did have some constipation which was treated with both MiraLAX and Senokot and was having some bowel movements at discharge.  She also had nausea which was felt likely related to medications. She was requiring high doses of potassium to correct hypo-kalemia. Potassium and magnesium were given and then discontinued prior to discharge because of possible nausea. She was started on a PPI for nausea and her history of reflux. Dilaudid IV was stopped. If nausea continues as an outpatient consider stopping Cymbalta.  The patient's diabetes was  under fair control and  she'll be treated with Lantus and small doses of NovoLog pre-meal as an outpatient. At discharge she can be switched back to her original NPH twice a day dose.she was seen by nutritionist for dietary preferences and switch to a mechanical soft carbohydrate modified diabetic diet.  The patient did develop some volume depletion during her delirium and was treated with IV fluids and diuretic discontinued, BUN creatinine normal at discharge.  She also had significant anxiety and possibly developing some depression being treated with lorazepam 3 times a day and Cymbalta.  Diet: Mechanical soft carbohydrate modified diabetic diet Activity as per therapy CODE STATUS full code  Consults: urology and neuro surgery and palliative care  Significant Diagnostic Studies: labs: at discharge sodium 137, potassium 3.7, chloride 99, bicarbonate 31, glucose 145, BUN 19, creatinine 0.55 and radiology: CT scan: of the brain on June 22 for delirium showed no acute intracranial abnormality  Treatments: IV hydration, antibiotics: Levaquin, analgesia: Dilaudid and Morphine and anticoagulation: LMW heparin  Discharge Exam: Blood pressure 153/72, pulse 71, temperature 98.5 F (36.9 C), temperature source Oral, resp. rate 18, height 5\' 4"  (1.626 m), weight 67.132 kg (148 lb), SpO2 92.00%. Resp: clear to auscultation bilaterally Cardio: regular rate and rhythm, S1, S2 normal, no murmur, click, rub or gallop GI: soft, non-tender; bowel sounds normal; no masses,  no organomegaly Extremities: extremities normal, atraumatic, no cyanosis or edema  Disposition:nursing home placement  Discharge Orders    Future Appointments: Provider: Department: Dept Phone: Center:   03/14/2012 7:45 AM Sherrie George, MD [facility] [phone] None     Medication List  As of 02/08/2012  6:55 AM   STOP taking these medications         amiloride-hydrochlorothiazide 5-50 MG tablet      fentaNYL 50 MCG/HR      insulin lispro  100 UNIT/ML injection      insulin NPH 100 UNIT/ML injection      potassium chloride SA 20 MEQ tablet      traMADol 50 MG tablet      vitamin E 400 UNIT capsule         TAKE these medications         acetaminophen 325 MG tablet   Commonly known as: TYLENOL   Take 2 tablets (650 mg total) by mouth every 6 (six) hours as needed (offer before Roxanly is utilized).      antiseptic oral rinse Liqd   15 mLs by Mouth Rinse route 2 (two) times daily.      aspirin EC 81 MG tablet   Take 81 mg by mouth daily.      cholecalciferol 1000 UNITS tablet   Commonly known as: VITAMIN D   Take 1,000 Units by mouth daily.      DULoxetine 30 MG capsule   Commonly known as: CYMBALTA   Take 1 capsule (30 mg total) by mouth daily.      insulin aspart 100 UNIT/ML injection   Commonly known as: novoLOG   Inject 3 Units into the skin 3 (three) times daily with meals.      insulin glargine 100 UNIT/ML injection   Commonly known as: LANTUS   Inject 30 Units into the skin daily.      LORazepam 0.5 MG tablet   Commonly known as: ATIVAN   Take 1 tablet (0.5 mg total) by mouth every 8 (eight) hours.      morphine 30 MG 12 hr tablet  Commonly known as: MS CONTIN   Take 1 tablet (30 mg total) by mouth every 12 (twelve) hours.      morphine 20 MG/ML concentrated solution   Commonly known as: ROXANOL   Take 0.25 mLs (5 mg total) by mouth every 4 (four) hours as needed.      ondansetron 4 MG tablet   Commonly known as: ZOFRAN   Take 1 tablet (4 mg total) by mouth every 6 (six) hours as needed for nausea.      pantoprazole 40 MG tablet   Commonly known as: PROTONIX   Take 1 tablet (40 mg total) by mouth daily at 12 noon.      polyethylene glycol packet   Commonly known as: MIRALAX / GLYCOLAX   Take 17 g by mouth daily.      senna-docusate 8.6-50 MG per tablet   Commonly known as: Senokot-S   Take 2 tablets by mouth at bedtime.           Follow-up Information    Follow up with  Lillia Mountain, MD in 6 weeks.   Contact information:   [address], [address] [place], [place]. [place] [address] [phone]          Signed: Lillia Mountain 02/08/2012, 6:55 AM

## 2012-02-08 NOTE — Progress Notes (Signed)
CSW spoke with [facility] and the son Tresa Endo. Patient is ready for discharge. Pt for D/C today to  [facility]     . Plan transport via EMS. Pt and family are agreeable to plans.   Kayleen Memos. Leighton Ruff [phone]

## 2012-02-08 NOTE — Progress Notes (Signed)
Occupational Therapy Treatment Patient Details Name: [name] MRN: [id number] DOB: Apr 08, 1933 Today's Date: 02/08/2012 Time: 4098-1191 OT Time Calculation (min): 15 min  OT Assessment / Plan / Recommendation Comments on Treatment Session pt willing to work with therapy:  fearful during transfer with stedy    Follow Up Recommendations  Skilled nursing facility    Barriers to Discharge       Equipment Recommendations  Defer to next venue    Recommendations for Other Services    Frequency Min 1X/week   Plan Discharge plan remains appropriate    Precautions / Restrictions Precautions Precautions: Fall Restrictions Weight Bearing Restrictions: No   Pertinent Vitals/Pain Back:  Not rated but worse when weightbearing in stedy    ADL  Transfers/Ambulation Related to ADLs: came in during PT session.  Pt was sitting unsupported at EOB.  Used stedy to get to chair:  +2 A, Pt 30% to stand and bring hips forward.  Pt did have increased pain with weightbearing.  Also very fearful.  Performed sit to squat to place pillow under buttocks:  total A x 1 for unweighting hips and second person to place pillow. ADL Comments: Pt set up with grooming supplies in the chair    OT Diagnosis:    OT Problem List:   OT Treatment Interventions:     OT Goals Acute Rehab OT Goals Time For Goal Achievement: 02/17/12 ADL Goals Additional ADL Goal #1: Pt will go from sit to stand with total A x 2, 50% for adls ADL Goal: Additional Goal #1 - Progress: Goal set today  Visit Information  Last OT Received On: 02/08/12 Assistance Needed: +2 PT/OT Co-Evaluation/Treatment: Yes    Subjective Data      Prior Functioning       Cognition  Overall Cognitive Status: Appears within functional limits for tasks assessed/performed Arousal/Alertness: Awake/alert Orientation Level: Appears intact for tasks assessed Behavior During Session: Anxious Cognition - Other Comments: less anxious than previously  recorded    Mobility  Transfers Sit to Stand: 1: +2 Total assist;From bed;With upper extremity assist;From chair/3-in-1 Sit to Stand: Patient Percentage: 30% Stand to Sit: 2: Max assist;To chair/3-in-1 (decreased control of descent) Transfer via Lift Equipment: Stedy Details for Transfer Assistance: Stedy provided pt w/ lees fear of falling and allowed pt to practice standing multiple times.   Exercises    Balance Static Sitting Balance Static Sitting - Balance Support: No upper extremity supported;Feet supported Static Sitting - Level of Assistance: 5: Stand by assistance  End of Session OT - End of Session Activity Tolerance: Patient limited by fatigue;Patient limited by pain;Other (comment) (fearful) Patient left: in chair;with bed alarm set;with family/visitor present  GO     Kieth Hartis 02/08/2012, 10:48 AM Marica Otter, OTR/L [phone] 02/08/2012

## 2012-02-08 NOTE — Progress Notes (Signed)
CSW spoke with [facility] and they are choosing not to make an offer because the PT, unless PT notes indicate that patient is cooperative with therapy. CSW is waiting on an answer from [facility]. CSW spoke with the son Tresa Endo about the 14 other bed offers provided and consulted with him about making another offer. The son has had offer on the table for about 9 days now and is continuing to putt all his energy into his first two choices. CSW requested that the son to make another choice in the best interest of the patient and discharge planning. The son did note that the doctor informed the pat is ready for discharge. The son indicated he would let CSW know by lunch another bed option. The sons fears to make a decision are related to not wanting to be the sole person making the decision for his mother.   Kayleen Memos. Leighton Ruff [phone]

## 2012-02-08 NOTE — Progress Notes (Signed)
Subjective: Pain is much better.  Still some nausea, RN says it occurs right after evening meds which include potassium.  Last BM Saturday  Objective: Vital signs in last 24 hours: Temp:  [97.8 F (36.6 C)-98.7 F (37.1 C)] 98.5 F (36.9 C) (07/01 0605) Pulse Rate:  [71-89] 71  (07/01 0605) Resp:  [18] 18  (07/01 0605) BP: (104-153)/(61-72) 153/72 mmHg (07/01 0605) SpO2:  [92 %-96 %] 92 % (07/01 0605) Weight change:  Last BM Date: 02/05/12  Intake/Output from previous day: 06/30 0701 - 07/01 0700 In: 720 [P.O.:720] Out: 500 [Urine:500] Intake/Output this shift: Total I/O In: -  Out: 400 [Urine:400]  General appearance: alert and cooperative Resp: clear to auscultation bilaterally Cardio: regular rate and rhythm, S1, S2 normal, no murmur, click, rub or gallop GI: soft, non-tender; bowel sounds normal; no masses,  no organomegaly Extremities: extremities normal, atraumatic, no cyanosis or edema  Lab Results: No results found for this basename: WBC:2,HGB:2,HCT:2,PLT:2 in the last 72 hours BMET  [facility] 02/06/12 0349  NA 137  K 3.7  CL 99  CO2 31  GLUCOSE 145*  BUN 19  CREATININE 0.55  CALCIUM 8.9    Studies/Results: No results found.  Medications: I have reviewed the patient's current medications.  Assessment/Plan: Principal Problem:  *Low back pain radiating to left leg has improved on current regimen or morphine and cymbalta Active Problems:  Diabetes mellitus variable control.  Continue Lantus, mealtime novolog, novolog SSI  Neurogenic Bladder  I/O cath q shift  HTN (hypertension) acceptable BP off meds.  Hold diuretic as she has had trouble with hypokalemia and difficulty tolerating po potassium.  Multiple other BP meds in past not tolerated  Nausea?Ger  D/C potassium and magnesium as may be contributing to nausea.  Consider D/C of cymbalta as can cause nausea.  Dilaudid discontinued.  Morphine can cause nausea but essential to pain control, options  limited.  Hypokalemia resolved.    Disposition  Ready for NHP    LOS: 13 days   Asia Favata JOSEPH 02/08/2012, 6:25 AM

## 2012-02-08 NOTE — Progress Notes (Signed)
DC to [facility]. Belongings taken y son Tresa Endo, & husband. To SNF by [facility]. No change from am assessment.[name]

## 2012-02-08 NOTE — Discharge Instructions (Signed)
Back Pain, Adult Low back pain is very common. About 1 in 5 people have back pain.The cause of low back pain is rarely dangerous. The pain often gets better over time.About half of people with a sudden onset of back pain feel better in just 2 weeks. About 8 in 10 people feel better by 6 weeks.  CAUSES Some common causes of back pain include:  Strain of the muscles or ligaments supporting the spine.   Wear and tear (degeneration) of the spinal discs.   Arthritis.   Direct injury to the back.  DIAGNOSIS Most of the time, the direct cause of low back pain is not known.However, back pain can be treated effectively even when the exact cause of the pain is unknown.Answering your caregiver's questions about your overall health and symptoms is one of the most accurate ways to make sure the cause of your pain is not dangerous. If your caregiver needs more information, he or she may order lab work or imaging tests (X-rays or MRIs).However, even if imaging tests show changes in your back, this usually does not require surgery. HOME CARE INSTRUCTIONS For many people, back pain returns.Since low back pain is rarely dangerous, it is often a condition that people can learn to manageon their own.   Remain active. It is stressful on the back to sit or stand in one place. Do not sit, drive, or stand in one place for more than 30 minutes at a time. Take short walks on level surfaces as soon as pain allows.Try to increase the length of time you walk each day.   Do not stay in bed.Resting more than 1 or 2 days can delay your recovery.   Do not avoid exercise or work.Your body is made to move.It is not dangerous to be active, even though your back may hurt.Your back will likely heal faster if you return to being active before your pain is gone.   Pay attention to your body when you bend and lift. Many people have less discomfortwhen lifting if they bend their knees, keep the load close to their  bodies,and avoid twisting. Often, the most comfortable positions are those that put less stress on your recovering back.   Find a comfortable position to sleep. Use a firm mattress and lie on your side with your knees slightly bent. If you lie on your back, put a pillow under your knees.   Only take over-the-counter or prescription medicines as directed by your caregiver. Over-the-counter medicines to reduce pain and inflammation are often the most helpful.Your caregiver may prescribe muscle relaxant drugs.These medicines help dull your pain so you can more quickly return to your normal activities and healthy exercise.   Put ice on the injured area.   Put ice in a plastic bag.   Place a towel between your skin and the bag.   Leave the ice on for 15 to 20 minutes, 3 to 4 times a day for the first 2 to 3 days. After that, ice and heat may be alternated to reduce pain and spasms.   Ask your caregiver about trying back exercises and gentle massage. This may be of some benefit.   Avoid feeling anxious or stressed.Stress increases muscle tension and can worsen back pain.It is important to recognize when you are anxious or stressed and learn ways to manage it.Exercise is a great option.  SEEK MEDICAL CARE IF:  You have pain that is not relieved with rest or medicine.   You have   pain that does not improve in 1 week.   You have new symptoms.   You are generally not feeling well.  SEEK IMMEDIATE MEDICAL CARE IF:   You have pain that radiates from your back into your legs.   You develop new bowel or bladder control problems.   You have unusual weakness or numbness in your arms or legs.   You develop nausea or vomiting.   You develop abdominal pain.   You feel faint.  Document Released: 07/27/2005 Document Revised: 07/16/2011 Document Reviewed: 12/15/2010 [facility] Patient Information 2012 [place], [place].Urinary Tract Infection Infections of the urinary tract can start in several  places. A bladder infection (cystitis), a kidney infection (pyelonephritis), and a prostate infection (prostatitis) are different types of urinary tract infections (UTIs). They usually get better if treated with medicines (antibiotics) that kill germs. Take all the medicine until it is gone. You or your child may feel better in a few days, but TAKE ALL MEDICINE or the infection may not respond and may become more difficult to treat. HOME CARE INSTRUCTIONS   Drink enough water and fluids to keep the urine clear or pale yellow. Cranberry juice is especially recommended, in addition to large amounts of water.   Avoid caffeine, tea, and carbonated beverages. They tend to irritate the bladder.   Alcohol may irritate the prostate.   Only take over-the-counter or prescription medicines for pain, discomfort, or fever as directed by your caregiver.  To prevent further infections:  Empty the bladder often. Avoid holding urine for long periods of time.   After a bowel movement, women should cleanse from front to back. Use each tissue only once.   Empty the bladder before and after sexual intercourse.  FINDING OUT THE RESULTS OF YOUR TEST Not all test results are available during your visit. If your or your child's test results are not back during the visit, make an appointment with your caregiver to find out the results. Do not assume everything is normal if you have not heard from your caregiver or the medical facility. It is important for you to follow up on all test results. SEEK MEDICAL CARE IF:   There is back pain.   Your baby is older than 3 months with a rectal temperature of 100.5 F (38.1 C) or higher for more than 1 day.   Your or your child's problems (symptoms) are no better in 3 days. Return sooner if you or your child is getting worse.  SEEK IMMEDIATE MEDICAL CARE IF:   There is severe back pain or lower abdominal pain.   You or your child develops chills.   You have a fever.    Your baby is older than 3 months with a rectal temperature of 102 F (38.9 C) or higher.   Your baby is 99 months old or younger with a rectal temperature of 100.4 F (38 C) or higher.   There is nausea or vomiting.   There is continued burning or discomfort with urination.  MAKE SURE YOU:   I/O cath every 8 hours  Understand these instructions.   Will watch your condition.   Will get help right away if you are not doing well or get worse.  Document Released: 05/06/2005 Document Revised: 07/16/2011 Document Reviewed: 12/09/2006 [facility] Patient Information 2012 [place], [place].

## 2012-02-08 NOTE — Progress Notes (Signed)
Physical Therapy Treatment Patient Details Name: [name] MRN: [id number] DOB: [birth year] Today's Date: 02/08/2012 Time: 1010-1037 PT Time Calculation (min): 27 min  PT Assessment / Plan / Recommendation Comments on Treatment Session  PT. PARTICIPATED W/ THERPY TODAY AND WAS ABLE TO STAND W/ ASSITANCE. UTILIZED stedy TO ENSURE SAFETY AND ALLOW PT TO BE LESS ANXIOUS W/ TRANSFER DUE TO PT'S EXPRESSING FEAR OF FALLING.  REC. SNF. PT HAS POTENTIAL TO IMPROVE.    Follow Up Recommendations  Skilled nursing facility    Barriers to Discharge        Equipment Recommendations  Defer to next venue    Recommendations for Other Services    Frequency Min 3X/week   Plan Discharge plan remains appropriate;Frequency remains appropriate    Precautions / Restrictions Precautions Precautions: Fall Restrictions Weight Bearing Restrictions: No   Pertinent Vitals/Pain PT C/O BOTH LEGS HURTING WITH ATTEMPTS TO stand. After getting settled in chair, pt did not c/o pain. RN notified for pain meds,.    Mobility  Bed Mobility Bed Mobility: Sitting - Scoot to Edge of Bed;Supine to Sit Rolling Right: Patient Percentage: 40% Supine to Sit: 3: Mod assist;HOB elevated;With rails Supine to Sit: Patient Percentage: 40% Sitting - Scoot to Edge of Bed: 3: Mod assist;With rail Transfers Transfers: Sit to Stand;Stand to Sit Sit to Stand: 1: +2 Total assist;From bed;With upper extremity assist;From chair/3-in-1 Sit to Stand: Patient Percentage: 30% Stand to Sit: 2: Max assist;To chair/3-in-1 (decreased control of descent) Transfer via Lift Equipment: Stedy Details for Transfer Assistance: Stedy provided pt w/ lees fear of falling and allowed pt to practice standing multiple times.    Exercises   attempted leg exercises in sitting but pt stated legs were painful.   PT Diagnosis:    PT Problem List:   PT Treatment Interventions:     PT Goals Acute Rehab PT Goals Pt will go Supine/Side to Sit: with max  assist;with min assist PT Goal: Supine/Side to Sit - Progress: Updated due to goal met Pt will go Stand to Sit: with max assist PT Goal: Stand to Sit - Progress: Progressing toward goal  Visit Information  Last PT Received On: 02/08/12 Assistance Needed: +2 PT/OT Co-Evaluation/Treatment: Yes    Subjective Data  Subjective: Don't let me fall   Cognition  Overall Cognitive Status: Appears within functional limits for tasks assessed/performed Arousal/Alertness: Awake/alert Orientation Level: Appears intact for tasks assessed Behavior During Session: Anxious Cognition - Other Comments: less anxious than previously recorded    Balance  Static Sitting Balance Static Sitting - Balance Support: No upper extremity supported;Feet supported Static Sitting - Level of Assistance: 5: Stand by assistance  End of Session PT - End of Session Activity Tolerance: Patient tolerated treatment well Patient left: in chair;with call bell/phone within reach;with family/visitor present Nurse Communication: Mobility status;Need for lift equipment       Rada Hay 02/08/2012, 10:53 [id number]

## 2012-02-08 NOTE — Progress Notes (Signed)
Inpatient Diabetes Program Recommendations  AACE/ADA: New Consensus Statement on Inpatient Glycemic Control (2009)  Target Ranges:  Prepandial:   less than 140 mg/dL      Peak postprandial:   less than 180 mg/dL (1-2 hours)      Critically ill patients:  140 - 180 mg/dL   Reason for Visit: Hyperglycemia  Results for [name], [name] (MRN [id number]) as of 02/08/2012 13:41  Ref. Range 02/07/2012 12:04 02/07/2012 16:47 02/07/2012 21:01 02/08/2012 07:48 02/08/2012 11:46  Glucose-Capillary Latest Range: 70-99 mg/dL 621 (H) 308 (H) 657 (H) 201 (H) 373 (H)      Note: Lantus increased today.  Would recommend increasing the meal coverage insulin to Novolog 6 units tidwc if pt eats 50% meal.  Will follow.

## 2012-02-09 NOTE — Progress Notes (Signed)
Clinical Social Work Department CLINICAL SOCIAL WORK PLACEMENT NOTE 02/09/2012  Patient:  [name], [name]  Account Number:  [id number] Admit date:  01/26/2012  Clinical Social Worker:  Jodelle Red  Date/time:  02/01/2012 10:08 AM  Clinical Social Work is seeking post-discharge placement for this patient at the following level of care:   SKILLED NURSING   (*CSW will update this form in Epic as items are completed)   02/01/2012  Patient/family provided with [facility] Department of Clinical Social Work's list of facilities offering this level of care within the geographic area requested by the patient (or if unable, by the patient's family).  02/01/2012  Patient/family informed of their freedom to choose among providers that offer the needed level of care, that participate in Medicare, Medicaid or managed care program needed by the patient, have an available bed and are willing to accept the patient.  02/01/2012  Patient/family informed of [facility]' ownership interest in [facility], as well as of the fact that they are under no obligation to receive care at this facility.  PASARR submitted to EDS on 02/01/2012 PASARR number received from EDS on 02/01/2012  FL2 transmitted to all facilities in geographic area requested by pt/family on  02/01/2012 FL2 transmitted to all facilities within larger geographic area on 02/01/2012  Patient informed that his/her managed care company has contracts with or will negotiate with  certain facilities, including the following:     Patient/family informed of bed offers received:  03/03/2012 Patient chooses bed at [facility] Physician recommends and patient chooses bed at  [facility]  Patient to be transferred to [facility] on  03/07/2012 Patient to be transferred to facility by   The following physician request were entered in Epic:   Additional Comments:  Clyda Smyth C.  Leighton Ruff [phone]

## 2012-02-10 NOTE — Consult Note (Signed)
Agree with above 

## 2012-02-26 ENCOUNTER — Inpatient Hospital Stay (HOSPITAL_COMMUNITY): Payer: Medicare Other

## 2012-02-26 ENCOUNTER — Encounter (HOSPITAL_COMMUNITY): Payer: Self-pay | Admitting: Emergency Medicine

## 2012-02-26 ENCOUNTER — Inpatient Hospital Stay (HOSPITAL_COMMUNITY)
Admission: EM | Admit: 2012-02-26 | Discharge: 2012-03-03 | DRG: 378 | Disposition: A | Discharge status: Skilled Nursing Facility | Payer: Medicare Other | Attending: Internal Medicine | Admitting: Internal Medicine

## 2012-02-26 DIAGNOSIS — E119 Type 2 diabetes mellitus without complications: Secondary | ICD-10-CM | POA: Diagnosis present

## 2012-02-26 DIAGNOSIS — K922 Gastrointestinal hemorrhage, unspecified: Secondary | ICD-10-CM | POA: Diagnosis present

## 2012-02-26 DIAGNOSIS — K626 Ulcer of anus and rectum: Secondary | ICD-10-CM | POA: Diagnosis present

## 2012-02-26 DIAGNOSIS — R404 Transient alteration of awareness: Secondary | ICD-10-CM

## 2012-02-26 DIAGNOSIS — K921 Melena: Principal | ICD-10-CM | POA: Diagnosis present

## 2012-02-26 DIAGNOSIS — L8992 Pressure ulcer of unspecified site, stage 2: Secondary | ICD-10-CM | POA: Diagnosis present

## 2012-02-26 DIAGNOSIS — M79605 Pain in left leg: Secondary | ICD-10-CM | POA: Diagnosis present

## 2012-02-26 DIAGNOSIS — K625 Hemorrhage of anus and rectum: Secondary | ICD-10-CM

## 2012-02-26 DIAGNOSIS — L89109 Pressure ulcer of unspecified part of back, unspecified stage: Secondary | ICD-10-CM | POA: Diagnosis present

## 2012-02-26 DIAGNOSIS — I10 Essential (primary) hypertension: Secondary | ICD-10-CM | POA: Diagnosis present

## 2012-02-26 DIAGNOSIS — Z66 Do not resuscitate: Secondary | ICD-10-CM | POA: Diagnosis present

## 2012-02-26 DIAGNOSIS — M545 Low back pain, unspecified: Secondary | ICD-10-CM | POA: Diagnosis present

## 2012-02-26 DIAGNOSIS — R410 Disorientation, unspecified: Secondary | ICD-10-CM

## 2012-02-26 DIAGNOSIS — E876 Hypokalemia: Secondary | ICD-10-CM | POA: Diagnosis present

## 2012-02-26 DIAGNOSIS — M4800 Spinal stenosis, site unspecified: Secondary | ICD-10-CM | POA: Diagnosis present

## 2012-02-26 DIAGNOSIS — K5900 Constipation, unspecified: Secondary | ICD-10-CM | POA: Diagnosis present

## 2012-02-26 LAB — URINE MICROSCOPIC-ADD ON

## 2012-02-26 LAB — ABO/RH: ABO/RH(D): O POS

## 2012-02-26 LAB — HEMOGLOBIN AND HEMATOCRIT, BLOOD
HCT: 30.1 % — ABNORMAL LOW (ref 36.0–46.0)
HCT: 28.7 % — ABNORMAL LOW (ref 36.0–46.0)
Hemoglobin: 9.8 g/dL — ABNORMAL LOW (ref 12.0–15.0)
Hemoglobin: 11.4 g/dL — ABNORMAL LOW (ref 12.0–15.0)

## 2012-02-26 LAB — CBC WITH DIFFERENTIAL/PLATELET
Eosinophils Relative: 3 % (ref 0–5)
Lymphocytes Relative: 27 % (ref 12–46)
Lymphs Abs: 2.1 10*3/uL (ref 0.7–4.0)
MCV: 82.7 fL (ref 78.0–100.0)
Neutro Abs: 4.8 10*3/uL (ref 1.7–7.7)
Neutrophils Relative %: 61 % (ref 43–77)
Platelets: 278 10*3/uL (ref 150–400)
RBC: 4.11 MIL/uL (ref 3.87–5.11)
WBC: 7.8 10*3/uL (ref 4.0–10.5)

## 2012-02-26 LAB — COMPREHENSIVE METABOLIC PANEL
ALT: 8 U/L (ref 0–35)
AST: 11 U/L (ref 0–37)
Albumin: 2.4 g/dL — ABNORMAL LOW (ref 3.5–5.2)
CO2: 31 mEq/L (ref 19–32)
Chloride: 98 mEq/L (ref 96–112)
GFR calc non Af Amer: 89 mL/min — ABNORMAL LOW (ref >90)
Sodium: 136 mEq/L (ref 135–145)
Total Bilirubin: 0.3 mg/dL (ref 0.3–1.2)

## 2012-02-26 LAB — TYPE AND SCREEN

## 2012-02-26 LAB — URINALYSIS, ROUTINE W REFLEX MICROSCOPIC
Glucose, UA: 250 mg/dL — AB
Nitrite: NEGATIVE
Specific Gravity, Urine: 1.018 (ref 1.005–1.030)
pH: 7.5 (ref 5.0–8.0)

## 2012-02-26 LAB — GLUCOSE, CAPILLARY: Glucose-Capillary: 88 mg/dL (ref 70–99)

## 2012-02-26 MED ORDER — SODIUM CHLORIDE 0.9 % IV BOLUS (SEPSIS)
500.0000 mL | Freq: Once | INTRAVENOUS | Status: AC
  Administered 2012-02-26: 500 mL via INTRAVENOUS

## 2012-02-26 MED ORDER — MORPHINE SULFATE 2 MG/ML IJ SOLN
2.0000 mg | INTRAMUSCULAR | Status: DC | PRN
  Administered 2012-02-26 – 2012-03-03 (×17): 2 mg via INTRAVENOUS
  Filled 2012-02-26 (×18): qty 1

## 2012-02-26 MED ORDER — ONDANSETRON HCL 4 MG/2ML IJ SOLN
4.0000 mg | Freq: Four times a day (QID) | INTRAMUSCULAR | Status: DC | PRN
  Administered 2012-02-26 – 2012-02-28 (×2): 4 mg via INTRAVENOUS
  Filled 2012-02-26 (×2): qty 2

## 2012-02-26 MED ORDER — IOHEXOL 300 MG/ML  SOLN
100.0000 mL | Freq: Once | INTRAMUSCULAR | Status: AC | PRN
  Administered 2012-02-26: 100 mL via INTRAVENOUS

## 2012-02-26 MED ORDER — ONDANSETRON HCL 4 MG PO TABS: 4.0000 mg | ORAL_TABLET | Freq: Four times a day (QID) | ORAL | Status: DC | PRN

## 2012-02-26 MED ORDER — SENNOSIDES-DOCUSATE SODIUM 8.6-50 MG PO TABS
2.0000 | ORAL_TABLET | Freq: Every day | ORAL | Status: DC
  Administered 2012-02-26: 2 via ORAL
  Filled 2012-02-26 (×2): qty 2

## 2012-02-26 MED ORDER — POLYETHYLENE GLYCOL 3350 17 G PO PACK
17.0000 g | PACK | Freq: Three times a day (TID) | ORAL | Status: DC
  Administered 2012-02-26 – 2012-02-28 (×5): 17 g via ORAL
  Filled 2012-02-26 (×7): qty 1

## 2012-02-26 MED ORDER — LIDOCAINE 5 % EX PTCH
1.0000 | MEDICATED_PATCH | CUTANEOUS | Status: DC
  Administered 2012-02-27 – 2012-03-03 (×6): 1 via TRANSDERMAL
  Filled 2012-02-26 (×6): qty 1

## 2012-02-26 MED ORDER — BISACODYL 10 MG RE SUPP
10.0000 mg | Freq: Once | RECTAL | Status: AC
  Administered 2012-02-26: 10 mg via RECTAL
  Filled 2012-02-26: qty 1

## 2012-02-26 MED ORDER — ALBUTEROL SULFATE (5 MG/ML) 0.5% IN NEBU: 2.5000 mg | INHALATION_SOLUTION | RESPIRATORY_TRACT | Status: DC | PRN

## 2012-02-26 MED ORDER — SODIUM CHLORIDE 0.9 % IV SOLN: INTRAVENOUS | Status: DC

## 2012-02-26 MED ORDER — VITAMIN D3 25 MCG (1000 UNIT) PO TABS
1000.0000 [IU] | ORAL_TABLET | Freq: Every day | ORAL | Status: DC
  Administered 2012-02-26 – 2012-03-03 (×7): 1000 [IU] via ORAL
  Filled 2012-02-26 (×7): qty 1

## 2012-02-26 MED ORDER — POLYETHYLENE GLYCOL 3350 17 G PO PACK: 17.0000 g | PACK | Freq: Every day | ORAL | Status: DC

## 2012-02-26 MED ORDER — MORPHINE SULFATE ER 30 MG PO TBCR
30.0000 mg | EXTENDED_RELEASE_TABLET | Freq: Two times a day (BID) | ORAL | Status: DC
  Administered 2012-02-26 – 2012-03-03 (×11): 30 mg via ORAL
  Filled 2012-02-26 (×11): qty 1

## 2012-02-26 MED ORDER — DEXTROSE-NACL 5-0.45 % IV SOLN
INTRAVENOUS | Status: AC
  Administered 2012-02-26: 50 mL/h via INTRAVENOUS

## 2012-02-26 MED ORDER — LORAZEPAM 0.5 MG PO TABS
0.5000 mg | ORAL_TABLET | Freq: Three times a day (TID) | ORAL | Status: DC
  Administered 2012-02-26 – 2012-03-03 (×16): 0.5 mg via ORAL
  Filled 2012-02-26 (×16): qty 1

## 2012-02-26 MED ORDER — INSULIN ASPART 100 UNIT/ML ~~LOC~~ SOLN
0.0000 [IU] | SUBCUTANEOUS | Status: DC
  Administered 2012-02-27 (×2): 1 [IU] via SUBCUTANEOUS
  Administered 2012-02-27 (×3): 2 [IU] via SUBCUTANEOUS
  Administered 2012-02-28: 1 [IU] via SUBCUTANEOUS
  Administered 2012-02-28: 5 [IU] via SUBCUTANEOUS
  Administered 2012-02-28: 1 [IU] via SUBCUTANEOUS
  Administered 2012-02-29: 2 [IU] via SUBCUTANEOUS
  Administered 2012-02-29: 3 [IU] via SUBCUTANEOUS
  Administered 2012-02-29: 2 [IU] via SUBCUTANEOUS
  Administered 2012-02-29 (×2): 1 [IU] via SUBCUTANEOUS
  Administered 2012-02-29: 3 [IU] via SUBCUTANEOUS
  Administered 2012-03-01: 1 [IU] via SUBCUTANEOUS
  Administered 2012-03-01: 2 [IU] via SUBCUTANEOUS
  Administered 2012-03-01 (×2): 1 [IU] via SUBCUTANEOUS
  Administered 2012-03-02: 2 [IU] via SUBCUTANEOUS
  Administered 2012-03-02: 1 [IU] via SUBCUTANEOUS
  Administered 2012-03-03: 3 [IU] via SUBCUTANEOUS
  Administered 2012-03-03: 2 [IU] via SUBCUTANEOUS
  Administered 2012-03-03: 1 [IU] via SUBCUTANEOUS
  Administered 2012-03-03: 2 [IU] via SUBCUTANEOUS

## 2012-02-26 MED ORDER — GUAIFENESIN-DM 100-10 MG/5ML PO SYRP: 5.0000 mL | ORAL_SOLUTION | ORAL | Status: DC | PRN

## 2012-02-26 MED ORDER — SODIUM CHLORIDE 0.9 % IJ SOLN
3.0000 mL | Freq: Two times a day (BID) | INTRAMUSCULAR | Status: DC
  Administered 2012-02-26 – 2012-03-03 (×5): 3 mL via INTRAVENOUS

## 2012-02-26 MED ORDER — DULOXETINE HCL 30 MG PO CPEP
30.0000 mg | ORAL_CAPSULE | Freq: Every day | ORAL | Status: DC
  Administered 2012-02-26 – 2012-03-03 (×7): 30 mg via ORAL
  Filled 2012-02-26 (×7): qty 1

## 2012-02-26 MED ORDER — ASPIRIN EC 81 MG PO TBEC
81.0000 mg | DELAYED_RELEASE_TABLET | Freq: Every day | ORAL | Status: DC
  Administered 2012-02-27: 81 mg via ORAL
  Filled 2012-02-26 (×2): qty 1

## 2012-02-26 MED ORDER — PANTOPRAZOLE SODIUM 40 MG IV SOLR
40.0000 mg | Freq: Two times a day (BID) | INTRAVENOUS | Status: DC
  Administered 2012-02-26 – 2012-03-03 (×12): 40 mg via INTRAVENOUS
  Filled 2012-02-26 (×13): qty 40

## 2012-02-26 NOTE — Consult Note (Signed)
[facility] Gastroenterology Consultation Note  Referring Provider:  Dr. Susa Raring [facility]) Primary Care Physician:  Lillia Mountain, MD Primary Gastroenterologist:  Dr. Danise Edge  Reason for Consultation:  Blood in stool, constipation  HPI: [name] is a 78 y.o. female admitted for evaluation of blood in stool.  Patient has multiple medical problems, as outlined above, including vertebral compression fractures on chronic narcotics.  Since being on narcotics, over the past few months, she has developed progressive constipation.  Takes Miralax, but with incomplete success.  Has chronic lower abdominal pain and lower back pain, no significant change over the past few months.  Is resident of [place] nursing home, and is largely bed-bound from her abdominal pain.  Presents to ED with complaints of bright red blood per rectum.  Started today, bright red, unclear volume.  Had screening colonoscopy in 2009 by Dr. Josefa Half showed diminutive polyp otherwise normal; no mention of diverticulosis was seen.    Past Medical History  Diagnosis Date  . Diabetes mellitus     greater than 30 years  . Hypertension   . Incontinence of urine   . Diarrhea   . Neuropathy     bilateral feet  . Arthritis     lower back/hands  . GERD (gastroesophageal reflux disease)     Past Surgical History  Procedure Date  . Eye surgery     bil.cataract-unknown year  . Incontinence surgery 2005  . Tonsillectomy 1964  . Abdominal hysterectomy     1982  . Ovarian cyst surgery 1972  . Breast biopsy 1990  . Cholecystectomy 1988  . Cystoscopy 2009    macroplastique inj  . Rectocele repair 10/15/2011    Procedure: POSTERIOR REPAIR (RECTOCELE);  Surgeon: Kathi Ludwig, MD;  Location: [facility];  Service: Urology;;  posterior vaginal vault repair with sacral spinus repair with graft  . Cystoscopy 10/15/2011    Procedure: CYSTOSCOPY;  Surgeon: Kathi Ludwig, MD;  Location: [facility];  Service: Urology;  Laterality: N/A;    Prior to Admission medications   Medication Sig Start Date End Date Taking? Authorizing Provider  acetaminophen (TYLENOL) 650 MG CR tablet Take 650 mg by mouth every 6 (six) hours as needed. For pain.   Yes Historical Provider, MD  aspirin EC 81 MG tablet Take 81 mg by mouth daily.   Yes Historical Provider, MD  cholecalciferol (VITAMIN D) 1000 UNITS tablet Take 1,000 Units by mouth daily.   Yes Historical Provider, MD  DULoxetine (CYMBALTA) 30 MG capsule Take 1 capsule (30 mg total) by mouth daily. 02/08/12 02/07/13 Yes Lillia Mountain, MD  insulin aspart (NOVOLOG) 100 UNIT/ML injection Inject 3 Units into the skin 3 (three) times daily with meals. 02/08/12 02/07/13 Yes Lillia Mountain, MD  insulin glargine (LANTUS) 100 UNIT/ML injection Inject 30 Units into the skin daily. 02/08/12 02/07/13 Yes Lillia Mountain, MD  lansoprazole (PREVACID) 30 MG capsule Take 30 mg by mouth daily.   Yes Historical Provider, MD  lidocaine (LIDODERM) 5 % Place 1 patch onto the skin daily. Remove & Discard patch within 12 hours or as directed by MD   Yes Historical Provider, MD  LORazepam (ATIVAN) 0.5 MG tablet Take 0.5 mg by mouth every 8 (eight) hours. For anxiety.   Yes Historical Provider, MD  morphine (MS CONTIN) 30 MG 12 hr tablet Take 1 tablet (30 mg total) by mouth every 12 (twelve) hours. 02/08/12  Yes Lillia Mountain, MD  morphine (  ROXANOL) 20 MG/ML concentrated solution Take 5 mg by mouth every 4 (four) hours as needed. For pain.   Yes Historical Provider, MD  ondansetron (ZOFRAN) 4 MG tablet Take 4 mg by mouth every 6 (six) hours as needed. For nausea.   Yes Historical Provider, MD  polyethylene glycol (MIRALAX / GLYCOLAX) packet Take 17 g by mouth daily.   Yes Historical Provider, MD  senna-docusate (SENOKOT-S) 8.6-50 MG per tablet Take 2 tablets by mouth at bedtime. 02/08/12 02/07/13 Yes Lillia Mountain, MD    Current Facility-Administered  Medications  Medication Dose Route Frequency Provider Last Rate Last Dose  . albuterol (PROVENTIL) (5 MG/ML) 0.5% nebulizer solution 2.5 mg  2.5 mg Nebulization Q2H PRN Leroy Sea, MD      . aspirin EC tablet 81 mg  81 mg Oral Daily Leroy Sea, MD      . cholecalciferol (VITAMIN D) tablet 1,000 Units  1,000 Units Oral Daily Leroy Sea, MD      . dextrose 5 %-0.45 % sodium chloride infusion   Intravenous Continuous Leroy Sea, MD      . DULoxetine (CYMBALTA) DR capsule 30 mg  30 mg Oral Daily Leroy Sea, MD      . guaiFENesin-dextromethorphan (ROBITUSSIN DM) 100-10 MG/5ML syrup 5 mL  5 mL Oral Q4H PRN Leroy Sea, MD      . insulin aspart (novoLOG) injection 0-9 Units  0-9 Units Subcutaneous Q4H Leroy Sea, MD      . iohexol (OMNIPAQUE) 300 MG/ML solution 100 mL  100 mL Intravenous Once PRN Medication Radiologist, MD   100 mL at 02/26/12 1716  . lidocaine (LIDODERM) 5 % 1 patch  1 patch Transdermal Q24H Leroy Sea, MD      . LORazepam (ATIVAN) tablet 0.5 mg  0.5 mg Oral Q8H Leroy Sea, MD      . morphine (MS CONTIN) 12 hr tablet 30 mg  30 mg Oral Q12H Leroy Sea, MD      . morphine 2 MG/ML injection 2 mg  2 mg Intravenous Q4H PRN Leroy Sea, MD   2 mg at 02/26/12 1519  . ondansetron (ZOFRAN) tablet 4 mg  4 mg Oral Q6H PRN Leroy Sea, MD       Or  . ondansetron (ZOFRAN) injection 4 mg  4 mg Intravenous Q6H PRN Leroy Sea, MD      . pantoprazole (PROTONIX) injection 40 mg  40 mg Intravenous Q12H Leroy Sea, MD      . polyethylene glycol (MIRALAX / GLYCOLAX) packet 17 g  17 g Oral Daily Leroy Sea, MD      . senna-docusate (Senokot-S) tablet 2 tablet  2 tablet Oral QHS Leroy Sea, MD      . sodium chloride 0.9 % bolus 500 mL  500 mL Intravenous Once Remi Haggard, NP   500 mL at 02/26/12 1152  . sodium chloride 0.9 % injection 3 mL  3 mL Intravenous Q12H Leroy Sea, MD      . DISCONTD: 0.9 %  sodium  chloride infusion   Intravenous STAT Remi Haggard, NP        Allergies as of 02/26/2012 - Review Complete 02/26/2012  Allergen Reaction Noted  . Neosporin (neomycin-bacitracin zn-polymyx) Rash 10/09/2011  . Sulfa antibiotics Rash 10/09/2011    Family History  Problem Relation Age of Onset  . Osteoarthritis Sister   . Osteoarthritis Brother  History   Social History  . Marital Status: Married    Spouse Name: N/A    Number of Children: N/A  . Years of Education: N/A   Occupational History  . Not on file.   Social History Main Topics  . Smoking status: Never Smoker   . Smokeless tobacco: Never Used  . Alcohol Use: No  . Drug Use: No  . Sexually Active:    Other Topics Concern  . Not on file   Social History Narrative  . No narrative on file    Review of Systems (Per ROS from Dr. Thedore Mins dated today 5/19) In addition to the HPI above,  No Fever-chills,  No Headache, No changes with Vision or hearing,  No problems swallowing food or Liquids,  No Chest pain, Cough or Shortness of Breath,  No Abdominal pain, No Nausea or Vommitting, Bowel movements are regular,  No Blood in stool or Urine,  No dysuria,  No new skin rashes or bruises, does have some skin breakdown above her coccyx  No new joints pains-aches, she does have constant low back pain generalized lower extremity pain and some abdominal discomfort but says all of this is chronic  No new weakness, tingling, numbness in any extremity, she is chronically weak in both legs left more than right and is largely bedbound  No recent weight gain or loss,  No polyuria, polydypsia or polyphagia,  No significant Mental Stressors.   Physical Exam: Vital signs in last 24 hours: Temp:  [97.5 F (36.4 C)-98.9 F (37.2 C)] 97.5 F (36.4 C) (07/19 1650) Pulse Rate:  [82-108] 108  (07/19 1650) Resp:  [16-29] 16  (07/19 1650) BP: (121-145)/(59-78) 145/77 mmHg (07/19 1650) SpO2:  [96 %-98 %] 98 % (07/19 1650) Weight:   [72.2 kg (159 lb 2.8 oz)] 72.2 kg (159 lb 2.8 oz) (07/19 1650) Last BM Date: 02/26/12 General:   Alert,  Limited mobility, chronically ill-appearing but is not acutely toxic-appearing Head:  Normocephalic and atraumatic. Eyes:  Sclera clear, no icterus.   Conjunctiva slight pallor. Ears:  Normal auditory acuity. Nose:  No deformity, discharge,  or lesions. Neck:  Supple; no masses or thyromegaly. Lungs:  Clear throughout to auscultation.   No wheezes, crackles, or rhonchi. No acute distress. Heart:  Regular rate and rhythm; no murmurs, clicks, rubs,  or gallops. Abdomen: Mildly distended; soft; mild generalized tenderness; no peritonitis; no obvious ascites     Msk:  Very limited mobility (essentially bed-bound) Pulses:  Normal pulses noted. Extremities:  Without clubbing or edema. Neurologic:  Very limited mobility Psych:  Alert and cooperative. Normal mood and affect.   Lab Results:  Basename 02/26/12 1656 02/26/12 1435 02/26/12 1109  WBC -- -- 7.8  HGB 11.4* 9.8* 11.0*  HCT 35.3* 30.1* 34.0*  PLT -- -- 278   BMET  Basename 02/26/12 1109  NA 136  K 3.9  CL 98  CO2 31  GLUCOSE 94  BUN 14  CREATININE 0.55  CALCIUM 9.0   LFT  Basename 02/26/12 1109  PROT 5.3*  ALBUMIN 2.4*  AST 11  ALT 8  ALKPHOS 81  BILITOT 0.3  BILIDIR --  IBILI --   PT/INR No results found for this basename: LABPROT:2,INR:2 in the last 72 hours  Studies/Results: Ct Abdomen Pelvis W Contrast  02/26/2012  *RADIOLOGY REPORT*  Clinical Data: Back pain.  Lower GI bleed.  CT ABDOMEN AND PELVIS WITH CONTRAST  Technique:  Multidetector CT imaging of the abdomen and pelvis was performed  following the standard protocol during bolus administration of intravenous contrast.  Contrast: OMNIPAQUE IOHEXOL 300 MG/ML  SOLN  Comparison: CT of abdomen and pelvis 12/09/2011.  Findings:  Lung Bases: Linear opacities in the lower lobes of the lungs bilaterally consistent with areas of subsegmental atelectasis  and/or scarring.  Atherosclerosis.  Abdomen/Pelvis:  Status post cholecystectomy.  Mild intrahepatic biliary ductal dilatation.  Common bile duct measures up to 14 mm in the porta hepatis (increased).  The pancreas is atrophic.  No definite pancreatic ductal dilatation.  Spleen and bilateral adrenal glands are unremarkable.  Numerous low attenuation lesions in the kidneys bilaterally, many of which are too small to definitively characterize.  The largest renal lesion in the interpolar region of the left kidney measures 2.1 cm and is compatible with a simple cyst.  1.5 cm short-axis celiac axis lymph node has enlarged compared to prior study. No other definite pathologically enlarged lymph nodes are noted within the abdomen or pelvis.  Atherosclerosis of the abdominal and pelvic vasculature, without definite aneurysm or dissection. There is a large amount of well formed stool in the rectal vault and distal colon, suggestive of constipation.  No ascites or pneumoperitoneum and no pathologic distension of bowel. Asymmetric and irregular thickening of the urinary bladder wall, increased compared to prior examinations.  Musculoskeletal: Compared to prior examinations there is a new compression of L5 with approximately 30% loss of vertebral body height at this time.  Previously noted compression fracture of superior endplate of T12 with approximately 20% loss of height anteriorly is unchanged. There are no aggressive appearing lytic or blastic lesions noted in the visualized portions of the skeleton.  IMPRESSION: 1.  New compression fracture of L5 with approximately 30% loss of vertebral body height. 2.  Large volume of stool in the rectal vault and distal colon, suggesting constipation. 3.  Asymmetric bladder wall thickening which has significantly increased compared to prior examinations.  Clinical correlation and consideration for evaluation of urine cytology is recommended to exclude a urothelial neoplasm. 4.  Slight  increase in dilatation of common bile duct and intrahepatic biliary ductal dilatation compared to the prior examination.  No definite ductal stone is identified.  Correlation for signs and symptoms of biliary tract obstruction is recommended, as this could be indicative of a distal ductal stricture.  No pancreatic ductal dilatation is noted. 5.  Single mildly enlarged celiac axis lymph node has increased in size compared to the prior examination, but is nonspecific. Attention on follow-up studies is recommended. 6.  Low attenuation renal lesions, the largest of which is compatible with a simple cyst in the left kidney, with the smaller lesions too small to definitively characterize. 7.  Atherosclerosis.  Original Report Authenticated By: Florencia Reasons, M.D.   Impression:  1.  Abdominal pain, chronic.  Suspect multifactorial (lumbar vertebral compression fractures, constipation). 2.  Constipation.  Likely multifactorial (compression fractures, narcotic requirement). 3.  Blood in stool.  Repeat hgb now 11.4.  No rampant ongoing bleeding seen.  Principal differential considerations include stercoral ulcer (from constipation) versus diverticular bleed (though not appreciated on colonoscopy in 2009) versus solitary rectal ulcer syndrome.  Plan:  1.  Treat constipation. 2.  If bleeding persists overnight, consider tagged RBC study. 3.  If bleeding persists over the next day or two, after treatment of constipation, would consider sigmoidoscopy to exclude treatable source. 4.  If no bleeding after treatment of constipation, would follow supportively without further diagnostic evaluation. 5.  Case and logic discussed  at length with patient and her husband, who voiced understanding of our course of action.   LOS: 0 days   Schylar Allard M  02/26/2012, 6:06 PM

## 2012-02-26 NOTE — ED Provider Notes (Signed)
History     CSN: [id number]  Arrival date & time 02/26/12  1051   First MD Initiated Contact with Patient 02/26/12 1115      Chief Complaint  Patient presents with  . Rectal Bleeding    (Consider location/radiation/quality/duration/timing/severity/associated sxs/prior treatment) Patient is a 78 y.o. female presenting with hematochezia. The history is provided by the patient. No language interpreter was used.  Rectal Bleeding  The current episode started today. The onset was gradual. The problem occurs continuously. The problem has been unchanged. The pain is mild. Associated symptoms include abdominal pain. Pertinent negatives include no fever, no diarrhea, no hemorrhoids, no nausea, no rectal pain, no vomiting, no vaginal bleeding, no vaginal discharge, no chest pain, no headaches and no difficulty breathing. She has been behaving normally. She has been eating and drinking normally. Her past medical history does not include inflammatory bowel disease or recent abdominal injury. There were no sick contacts. Recently, medical care has been given by EMS.   78 year old female coming in today with rectal bleeding that is profuse. Patient states that they were getting her up around 8:30 at [facility] x a couple months and they noticed that  she had bleeding from her rectum. Patient states her last colonoscopy was greater than 5 years ago with [facility] physicians. Patient states that she has been slightly dizzy. Denies abdominal pain but has pain with palpitation to the left lower quadrant greater than the right lower quadrants. Records show that she had a rectocele repaired in March 2013. Patient also states that she has been unable to cannulate since the surgery. She does have chronic back pain and was on a fentanyl patch and OxyContin and tramadol. Denies constipation. Patient is on Protonix daily. She is an insulin-dependent diabetic as well.  Past Medical History  Diagnosis Date    . Diabetes mellitus     greater than 30 years  . Hypertension   . Incontinence of urine   . Diarrhea   . Neuropathy     bilateral feet  . Arthritis     lower back/hands  . GERD (gastroesophageal reflux disease)     Past Surgical History  Procedure Date  . Eye surgery     bil.cataract-unknown year  . Incontinence surgery 2005  . Tonsillectomy 1964  . Abdominal hysterectomy     1982  . Ovarian cyst surgery 1972  . Breast biopsy 1990  . Cholecystectomy 1988  . Cystoscopy 2009    macroplastique inj  . Rectocele repair 10/15/2011    Procedure: POSTERIOR REPAIR (RECTOCELE);  Surgeon: Kathi Ludwig, MD;  Location: [facility];  Service: Urology;;  posterior vaginal vault repair with sacral spinus repair with graft  . Cystoscopy 10/15/2011    Procedure: CYSTOSCOPY;  Surgeon: Kathi Ludwig, MD;  Location: [facility];  Service: Urology;  Laterality: N/A;    Family History  Problem Relation Age of Onset  . Osteoarthritis Sister   . Osteoarthritis Brother     History  Substance Use Topics  . Smoking status: Never Smoker   . Smokeless tobacco: Not on file  . Alcohol Use: No    OB History    Grav Para Term Preterm Abortions TAB SAB Ect Mult Living                  Review of Systems  Constitutional: Negative.  Negative for fever.  HENT: Negative.   Eyes: Negative.   Respiratory: Negative.  Negative for shortness  of breath.   Cardiovascular: Negative.  Negative for chest pain.  Gastrointestinal: Positive for abdominal pain, blood in stool, hematochezia and anal bleeding. Negative for nausea, vomiting, diarrhea, abdominal distention, rectal pain and hemorrhoids.       LLQ  Genitourinary: Negative for vaginal bleeding, vaginal discharge and menstrual problem.  Musculoskeletal: Positive for back pain and gait problem.  Neurological: Positive for dizziness and light-headedness. Negative for facial asymmetry, weakness and headaches.   Psychiatric/Behavioral: Negative.   All other systems reviewed and are negative.    Allergies  Neosporin and Sulfa antibiotics  Home Medications   Current Outpatient Rx  Name Route Sig Dispense Refill  . FENTANYL 50 MCG/HR TD PT72 Transdermal Place 1 patch onto the skin every 3 (three) days.    Marland Kitchen LORAZEPAM 0.5 MG PO TABS Oral Take 0.5 mg by mouth every 8 (eight) hours as needed.    . OXYCODONE HCL ER 10 MG PO TB12 Oral Take 10 mg by mouth every 12 (twelve) hours.    . TRAMADOL HCL 50 MG PO TABS Oral Take 50 mg by mouth every 6 (six) hours as needed.    . ACETAMINOPHEN 325 MG PO TABS Oral Take 2 tablets (650 mg total) by mouth every 6 (six) hours as needed (offer before Roxanly is utilized). 60 tablet 0  . BIOTENE DRY MOUTH MT LIQD Mouth Rinse 15 mLs by Mouth Rinse route 2 (two) times daily. 900 Bottle 0  . ASPIRIN EC 81 MG PO TBEC Oral Take 81 mg by mouth daily.    Marland Kitchen VITAMIN D 1000 UNITS PO TABS Oral Take 1,000 Units by mouth daily.    . DULOXETINE HCL 30 MG PO CPEP Oral Take 1 capsule (30 mg total) by mouth daily. 30 capsule 0  . INSULIN ASPART 100 UNIT/ML [place] SOLN Subcutaneous Inject 3 Units into the skin 3 (three) times daily with meals. 1 vial 0  . INSULIN GLARGINE 100 UNIT/ML [place] SOLN Subcutaneous Inject 30 Units into the skin daily. 10 mL 0  . MORPHINE SULFATE ER 30 MG PO TBCR Oral Take 1 tablet (30 mg total) by mouth every 12 (twelve) hours. 60 tablet 0  . MORPHINE SULFATE (CONCENTRATE) 20 MG/ML PO SOLN Oral Take 0.25 mLs (5 mg total) by mouth every 4 (four) hours as needed. 30 mL 0  . PANTOPRAZOLE SODIUM 40 MG PO TBEC Oral Take 1 tablet (40 mg total) by mouth daily at 12 noon. 30 tablet 0  . SENNOSIDES-DOCUSATE SODIUM 8.6-50 MG PO TABS Oral Take 2 tablets by mouth at bedtime. 60 tablet 0    BP 123/78  Pulse 94  Temp 97.8 F (36.6 C) (Oral)  Resp 26  SpO2 97%  Physical Exam  Nursing note and vitals reviewed. Constitutional: She is oriented to person, place, and time. She  appears well-developed and well-nourished.  HENT:  Head: Normocephalic and atraumatic.  Eyes: Conjunctivae and EOM are normal. Pupils are equal, round, and reactive to light.  Neck: Normal range of motion. Neck supple.  Cardiovascular: Normal rate.   Pulmonary/Chest: Effort normal and breath sounds normal. No respiratory distress.  Abdominal: Soft. She exhibits no distension and no mass. There is tenderness. There is no rebound and no guarding.  Genitourinary: Guaiac positive stool. No vaginal discharge found.       Rectal bleeding with gross blood moderate amount  Musculoskeletal: Normal range of motion. She exhibits no edema and no tenderness.  Neurological: She is alert and oriented to person, place, and time. She  has normal reflexes.  Skin: Skin is warm and dry.  Psychiatric: She has a normal mood and affect.    ED Course  Procedures (including critical care time)   Labs Reviewed  CBC WITH DIFFERENTIAL  URINALYSIS, ROUTINE W REFLEX MICROSCOPIC  COMPREHENSIVE METABOLIC PANEL  TYPE AND SCREEN   No results found.   No diagnosis found.    MDM  Patient will be admitted by Dr. Thedore Mins  team 2 hospitalist for profuse rectal bleeding. Pt is on prevecid and asa daily.   Patient is symptomatic with dizziness. Vital signs stable presently. No other abnormal  labs and UA are normal.  Hgb 11.  Bleeding started 830am.  She is a NCB   Labs Reviewed  CBC WITH DIFFERENTIAL - Abnormal; Notable for the following:    Hemoglobin 11.0 (*)     HCT 34.0 (*)     All other components within normal limits  URINALYSIS, ROUTINE W REFLEX MICROSCOPIC - Abnormal; Notable for the following:    APPearance CLOUDY (*)     Glucose, UA 250 (*)     Leukocytes, UA SMALL (*)     All other components within normal limits  COMPREHENSIVE METABOLIC PANEL - Abnormal; Notable for the following:    Total Protein 5.3 (*)     Albumin 2.4 (*)     GFR calc non Af Amer 89 (*)     All other components within normal  limits  URINE MICROSCOPIC-ADD ON - Abnormal; Notable for the following:    Bacteria, UA MANY (*)     Crystals TRIPLE PHOSPHATE CRYSTALS (*)     All other components within normal limits  TYPE AND SCREEN  OCCULT BLOOD, POC DEVICE  ABO/RH          Remi Haggard, NP 02/26/12 1329 0150  Dr Matthias Hughs will consult for this pt.    Remi Haggard, NP 02/26/12 1348

## 2012-02-26 NOTE — H&P (Signed)
[facility]                                                                                    Patient Demographics  [name], is a 78 y.o. female  CSN: [id number]  MRN: [id number]  DOB - 03-16-1933  Admit Date - 02/26/2012  Outpatient Primary MD for the patient is Lillia Mountain, MD   With History of -  Past Medical History  Diagnosis Date  . Diabetes mellitus     greater than 30 years  . Hypertension   . Incontinence of urine   . Diarrhea   . Neuropathy     bilateral feet  . Arthritis     lower back/hands  . GERD (gastroesophageal reflux disease)       Past Surgical History  Procedure Date  . Eye surgery     bil.cataract-unknown year  . Incontinence surgery 2005  . Tonsillectomy 1964  . Abdominal hysterectomy     1982  . Ovarian cyst surgery 1972  . Breast biopsy 1990  . Cholecystectomy 1988  . Cystoscopy 2009    macroplastique inj  . Rectocele repair 10/15/2011    Procedure: POSTERIOR REPAIR (RECTOCELE);  Surgeon: Kathi Ludwig, MD;  Location: [facility];  Service: Urology;;  posterior vaginal vault repair with sacral spinus repair with graft  . Cystoscopy 10/15/2011    Procedure: CYSTOSCOPY;  Surgeon: Kathi Ludwig, MD;  Location: [facility];  Service: Urology;  Laterality: N/A;    in for   Chief Complaint  Patient presents with  . Rectal Bleeding     HPI  [name]  is a 78 y.o. female, who lives at [facility] has history of chronic L. spine pain for which she was hospitalized one month ago, she is largely bedbound due to constant back pain radiating to both of her legs, she does follow with back surgery as an outpatient for now she is not considered to be a surgical candidate, she is brought in from the nursing home after the nursing home staff noticed that patient appeared weak, in the ER she was found to have bright red blood per rectum, her hemoglobin has dropped a  gram and a half as compared to her last hemoglobin from few weeks ago, patient does complain of constipation arising from high-dose narcotics that she takes on a chronic basis, she did have a colonoscopy a year and a half ago by Dr. Laural Benes and was told to get a repeat colonoscopy in 5 years but she does not remember if any abnormalities were found. Denies any previous history of lower GI bleed or peptic ulcer disease, eyes any known history of internal or external hemorrhoids.   Review of Systems    In addition to the HPI above, No Fever-chills, No Headache, No changes with Vision or hearing, No problems swallowing food or Liquids, No Chest pain, Cough or Shortness of Breath, No Abdominal pain, No Nausea or Vommitting, Bowel movements are regular, No Blood in stool or Urine, No dysuria, No new skin rashes or bruises, does have some skin breakdown above her coccyx No new joints pains-aches, she  does have constant low back pain generalized lower extremity pain and some abdominal discomfort but says all of this is chronic No new weakness, tingling, numbness in any extremity, she is chronically weak in both legs left more than right and is largely bedbound No recent weight gain or loss, No polyuria, polydypsia or polyphagia, No significant Mental Stressors.  A full 10 point Review of Systems was done, except as stated above, all other Review of Systems were negative.   Social History History  Substance Use Topics  . Smoking status: Never Smoker   . Smokeless tobacco: Never Used  . Alcohol Use: No     Family History Family History  Problem Relation Age of Onset  . Osteoarthritis Sister   . Osteoarthritis Brother      Prior to Admission medications   Medication Sig Start Date End Date Taking? Authorizing Provider  acetaminophen (TYLENOL) 650 MG CR tablet Take 650 mg by mouth every 6 (six) hours as needed. For pain.   Yes Historical Provider, MD  aspirin EC 81 MG tablet Take 81  mg by mouth daily.   Yes Historical Provider, MD  cholecalciferol (VITAMIN D) 1000 UNITS tablet Take 1,000 Units by mouth daily.   Yes Historical Provider, MD  DULoxetine (CYMBALTA) 30 MG capsule Take 1 capsule (30 mg total) by mouth daily. 02/08/12 02/07/13 Yes Lillia Mountain, MD  insulin aspart (NOVOLOG) 100 UNIT/ML injection Inject 3 Units into the skin 3 (three) times daily with meals. 02/08/12 02/07/13 Yes Lillia Mountain, MD  insulin glargine (LANTUS) 100 UNIT/ML injection Inject 30 Units into the skin daily. 02/08/12 02/07/13 Yes Lillia Mountain, MD  lansoprazole (PREVACID) 30 MG capsule Take 30 mg by mouth daily.   Yes Historical Provider, MD  lidocaine (LIDODERM) 5 % Place 1 patch onto the skin daily. Remove & Discard patch within 12 hours or as directed by MD   Yes Historical Provider, MD  LORazepam (ATIVAN) 0.5 MG tablet Take 0.5 mg by mouth every 8 (eight) hours. For anxiety.   Yes Historical Provider, MD  morphine (MS CONTIN) 30 MG 12 hr tablet Take 1 tablet (30 mg total) by mouth every 12 (twelve) hours. 02/08/12  Yes Lillia Mountain, MD  morphine (ROXANOL) 20 MG/ML concentrated solution Take 5 mg by mouth every 4 (four) hours as needed. For pain.   Yes Historical Provider, MD  ondansetron (ZOFRAN) 4 MG tablet Take 4 mg by mouth every 6 (six) hours as needed. For nausea.   Yes Historical Provider, MD  polyethylene glycol (MIRALAX / GLYCOLAX) packet Take 17 g by mouth daily.   Yes Historical Provider, MD  senna-docusate (SENOKOT-S) 8.6-50 MG per tablet Take 2 tablets by mouth at bedtime. 02/08/12 02/07/13 Yes Lillia Mountain, MD    Allergies  Allergen Reactions  . Neosporin (Neomycin-Bacitracin Zn-Polymyx) Rash  . Sulfa Antibiotics Rash    Physical Exam  Vitals  Blood pressure 121/59, pulse 88, temperature 98.9 F (37.2 C), temperature source Oral, resp. rate 29, SpO2 96.00%.   1. General elderly female lying in bed in NAD,   2. Normal affect and insight, Not Suicidal  or Homicidal, Awake Alert, Oriented X 2  3. No F.N deficits, ALL C.Nerves Intact, Strength 5/5 all 4 extremities, Sensation intact all 4 extremities, Plantars down going.  4. Ears and Eyes appear Normal, Conjunctivae clear, PERRLA. Moist Oral Mucosa.  5. Supple Neck, No JVD, No cervical lymphadenopathy appriciated, No Carotid Bruits.  6. Symmetrical Chest wall movement, Good air  movement bilaterally, CTAB.  7. RRR, No Gallops, Rubs or Murmurs, No Parasternal Heave.  8. Positive Bowel Sounds, Abdomen Soft, Non tender, No organomegaly appriciated,No rebound -guarding or rigidity. There is about 200 cc of bright red blood around her rectal area, there couple of old skin tags around her rectum no external hemorrhoids.  9.  No Cyanosis, Normal Skin Turgor, No Skin Rash or Bruise.  10. Good muscle tone,  joints appear normal , no effusions, Normal ROM.  11. No Palpable Lymph Nodes in Neck or Axillae    Data Review  CBC  Lab 02/26/12 1109  WBC 7.8  HGB 11.0*  HCT 34.0*  PLT 278  MCV 82.7  MCH 26.8  MCHC 32.4  RDW 14.8  LYMPHSABS 2.1  MONOABS 0.7  EOSABS 0.2  BASOSABS 0.0  BANDABS --   ------------------------------------------------------------------------------------------------------------------  Chemistries   Lab 02/26/12 1109  NA 136  K 3.9  CL 98  CO2 31  GLUCOSE 94  BUN 14  CREATININE 0.55  CALCIUM 9.0  MG --  AST 11  ALT 8  ALKPHOS 81  BILITOT 0.3   ------------------------------------------------------------------------------------------------------------------ CrCl is unknown because both a height and weight (above a minimum accepted value) are required for this calculation. ------------------------------------------------------------------------------------------------------------------ No results found for this basename: TSH,T4TOTAL,FREET3,T3FREE,THYROIDAB in the last 72 hours   Coagulation profile No results found for this basename:  INR:5,PROTIME:5 in the last 168 hours ------------------------------------------------------------------------------------------------------------------- No results found for this basename: DDIMER:2 in the last 72 hours -------------------------------------------------------------------------------------------------------------------  Cardiac Enzymes No results found for this basename: CK:3,CKMB:3,TROPONINI:3,MYOGLOBIN:3 in the last 168 hours ------------------------------------------------------------------------------------------------------------------ No components found with this basename: POCBNP:3   ---------------------------------------------------------------------------------------------------------------  Urinalysis    Component Value Date/Time   COLORURINE YELLOW 02/26/2012 1209   APPEARANCEUR CLOUDY* 02/26/2012 1209   LABSPEC 1.018 02/26/2012 1209   PHURINE 7.5 02/26/2012 1209   GLUCOSEU 250* 02/26/2012 1209   HGBUR NEGATIVE 02/26/2012 1209   BILIRUBINUR NEGATIVE 02/26/2012 1209   KETONESUR NEGATIVE 02/26/2012 1209   PROTEINUR NEGATIVE 02/26/2012 1209   UROBILINOGEN 1.0 02/26/2012 1209   NITRITE NEGATIVE 02/26/2012 1209   LEUKOCYTESUR SMALL* 02/26/2012 1209     Assessment & Plan   1. Lower GI bleed - unclear etiology question silent diverticular bleed, have discussed the case with Dr. Betsy Coder GI physician who will see the patient shortly, for now will order CT abdomen pelvis with oral and IV contrast, monitor H&H closely, type screen packed RBCs, IV PPI, bowel regimen to avoid constipation and any internal rectal tear or hemorrhoidal erosion. Transfuse if needed goal hemoglobin above 8. N.p.o. except for medications with sips. Gentle IV fluids.   2. Diabetes mellitus type 2- currently n.p.o. we'll place her on low-dose D5 half-normal as she took Lantus last night, Q4 hour Accu-Cheks with low-dose sliding scale insulin only no Lantus.   3. Chronic lower back pain, bilateral  lower extremity weakness left more than right  -Patient is largely bedbound will resume PT once she is stable then outpatient followup with back surgery as before.   4. Constipation- narcotic related we'll continue bowel regimen when necessary enemas as needed.   5. Borderline UA will order urine cultures and monitor.   DVT Prophylaxis SCDs    AM Labs Ordered, also please review Full Orders  Family Communication: Admission, patients condition and plan of care including tests being ordered have been discussed with the patient and husband who indicate understanding and agree with the plan and Code Status.  Code Status DO NOT RESUSCITATE  Disposition Plan: S N [facility].  Time spent: 60 minutes  Condition GUARDED  Leroy Sea M.D on 02/26/2012 at 2:17 PM  Between 7am to 7pm - Pager - [phone]  After 7pm go to [email or web address] - password [email or web address]  And look for the night coverage person covering me after hours  [facility] Office  [phone]

## 2012-02-26 NOTE — ED Notes (Signed)
Bed:WA22<BR> Expected date:<BR> Expected time:<BR> Means of arrival:Ambulance<BR> Comments:<BR> Rectal bleeding

## 2012-02-26 NOTE — ED Notes (Signed)
Report called nurse unavailable at this will call back

## 2012-02-26 NOTE — ED Notes (Signed)
Pt brought in by ems  From [facility] reectal bleeding started this am bright in color denies pain

## 2012-02-26 NOTE — Consult Note (Signed)
WOC consult Note Reason for Consult:sacral ulcer Wound type:Moisture associated skin damage vs Stage II Pressure Ulcer Pressure Ulcer POA: Yes Measurement:1cm x 2cm x .2cm open area in center of 6cm x 8cm area of erythema Wound WUJ:WJXBJ, pink, granulating Drainage (amount, consistency, odor) none Periwound:erythematous with maceration secondary to urinary incontinence Dressing procedure/placement/frequency:low air loss overlay to assist with moisture, existing skin breakdown and to prevent recurrence of previously healed ulceration. Patient admitted for GI bleed; further increased risk due to diminished ability to self-position in bed.  I will implement a soft silicone foam dressing to the sacrum and expect reepithelialization consistent with improvement in status. Side to side positioning is ordered for in bed, a chair cushion is provided for her use when OOB. I will not follow, but will remain available as needed to this patient and her medical team.  Please re-consult if needed. Thanks, Ladona Mow, MSN, RN, [facility], CWOCN [phone])

## 2012-02-26 NOTE — ED Provider Notes (Signed)
Medical screening examination/treatment/procedure(s) were conducted as a shared visit with non-physician practitioner(s) and myself.  I personally evaluated the patient during the encounter   Pt presents from the nursing home with rectal bleeding.  No distress in the ED but she does appear pale and does have a large amount of blood on rectal on exam.  Will admit for observation, serial HCT, GI evaluation.   Celene Kras, MD 02/26/12 [phone]

## 2012-02-27 DIAGNOSIS — K5900 Constipation, unspecified: Secondary | ICD-10-CM

## 2012-02-27 LAB — GLUCOSE, CAPILLARY
Glucose-Capillary: 151 mg/dL — ABNORMAL HIGH (ref 70–99)
Glucose-Capillary: 145 mg/dL — ABNORMAL HIGH (ref 70–99)
Glucose-Capillary: 178 mg/dL — ABNORMAL HIGH (ref 70–99)
Glucose-Capillary: 171 mg/dL — ABNORMAL HIGH (ref 70–99)

## 2012-02-27 LAB — PROTIME-INR: INR: 0.94 (ref 0.00–1.49)

## 2012-02-27 LAB — BASIC METABOLIC PANEL
BUN: 11 mg/dL (ref 6–23)
Calcium: 8.8 mg/dL (ref 8.4–10.5)
Creatinine, Ser: 0.58 mg/dL (ref 0.50–1.10)
GFR calc Af Amer: >90 mL/min (ref >90)
GFR calc non Af Amer: 87 mL/min — ABNORMAL LOW (ref >90)
Glucose, Bld: 162 mg/dL — ABNORMAL HIGH (ref 70–99)
Potassium: 3.5 mEq/L (ref 3.5–5.1)

## 2012-02-27 LAB — HEMOGLOBIN AND HEMATOCRIT, BLOOD
HCT: 28.4 % — ABNORMAL LOW (ref 36.0–46.0)
Hemoglobin: 9.4 g/dL — ABNORMAL LOW (ref 12.0–15.0)
Hemoglobin: 9.2 g/dL — ABNORMAL LOW (ref 12.0–15.0)

## 2012-02-27 LAB — CBC
Hemoglobin: 10.9 g/dL — ABNORMAL LOW (ref 12.0–15.0)
MCH: 27.1 pg (ref 26.0–34.0)
MCHC: 32.6 g/dL (ref 30.0–36.0)
Platelets: 289 10*3/uL (ref 150–400)
RDW: 14.9 % (ref 11.5–15.5)

## 2012-02-27 MED ORDER — BISACODYL 5 MG PO TBEC
10.0000 mg | DELAYED_RELEASE_TABLET | Freq: Every day | ORAL | Status: DC
  Administered 2012-02-27: 10 mg via ORAL
  Filled 2012-02-27: qty 2

## 2012-02-27 MED ORDER — SENNOSIDES-DOCUSATE SODIUM 8.6-50 MG PO TABS
2.0000 | ORAL_TABLET | Freq: Every day | ORAL | Status: DC
  Administered 2012-02-27 – 2012-02-28 (×2): 2 via ORAL
  Filled 2012-02-27 (×2): qty 2

## 2012-02-27 MED ORDER — MORPHINE SULFATE (CONCENTRATE) 20 MG/ML PO SOLN
5.0000 mg | ORAL | Status: DC | PRN
  Administered 2012-02-27 – 2012-03-01 (×5): 5 mg via ORAL
  Filled 2012-02-27 (×5): qty 1

## 2012-02-27 MED ORDER — METHYLNALTREXONE BROMIDE 12 MG/0.6ML ~~LOC~~ SOLN: 12.0000 mg | Freq: Once | SUBCUTANEOUS | Status: DC

## 2012-02-27 MED ORDER — MAGNESIUM HYDROXIDE 400 MG/5ML PO SUSP
30.0000 mL | ORAL | Status: AC
  Administered 2012-02-27: 30 mL via ORAL
  Filled 2012-02-27: qty 30

## 2012-02-27 MED ORDER — DOCUSATE SODIUM 100 MG PO CAPS
200.0000 mg | ORAL_CAPSULE | Freq: Two times a day (BID) | ORAL | Status: DC
  Administered 2012-02-27 (×2): 200 mg via ORAL
  Filled 2012-02-27 (×4): qty 2

## 2012-02-27 NOTE — Progress Notes (Addendum)
[facility]                                                                                Patient Demographics  [name], is a 78 y.o. female  ZOX:[id number]  WJX:[id number]  DOB - Mar 28, 1933  Admit date - 02/26/2012  Admitting Physician Leroy Sea, MD  Outpatient Primary MD for the patient is Lillia Mountain, MD  LOS - 1   Chief Complaint  Patient presents with  . Rectal Bleeding        Assessment & Plan    1. Lower GI bleed - unclear etiology question silent diverticular bleed, CT scan noted with no evidence of diverticulosis or diverticulitis, continue to monitor H&H closely, type screen packed RBCs, IV PPI, bowel regimen to avoid constipation and any internal rectal tear or hemorrhoidal erosion. Transfuse if needed goal hemoglobin above 8. N.p.o. except for medications with sips. Gentle IV fluids. Still has some blood coming out however in the light of stable H&H it appears that bleeding might become and from an internal rectal tear from hard stools.    2. Diabetes mellitus type 2- currently n.p.o. we'll place her on low-dose D5 half-normal as she took Lantus last night, Q4 hour Accu-Cheks with low-dose sliding scale insulin only no Lantus.   CBG (last 3)   Basename 02/27/12 0810 02/27/12 0404 02/27/12 0012  GLUCAP 145* 151* 130*      3. Chronic lower back pain, bilateral lower extremity weakness left more than right -CT shows L-spine fracture , Patient is largely bedbound will discuss the CT findings with neurosurgeon on call for Dr. Earl Gala group, have D/W Dr. Lovell Sheehan 7-20- 13, he reviewed all new and old images and recommended no need to see the patient, he recommends outpt follow with Dr Jule Ser in 2-3 weeks.    4. Constipation- narcotic related we'll continue bowel regimen does are adjusted with pharmacy, if everything fails will try to Relistor in am.    5. Borderline UA will order urine cultures and monitor.    6.  Questionable bladder changes on CT scan along with possible renal cyst on CT scan. -  Urine cytology ordered, she does have a liter of urine in her bladder Foley will be placed. Will Request outpatient urology followup.    Code Status: DNR/DNI  Family Communication: Assist with patient bedside 02/27/2012, with husband 02/26/2019  Disposition Plan: SNF    Procedures CT abdomen pelvis   Consults  GI   Antibiotics  none   Time Spent in minutes   30   DVT Prophylaxis   SCDs    Susa Raring K M.D on 02/27/2012 at 11:01 AM  Between 7am to 7pm - Pager - [phone]  After 7pm go to [email or web address] - password [email or web address]  And look for the night coverage person covering for me after hours  [facility] Office  [phone]    Subjective:   [name] today has, No headache, No chest pain, No Nausea, No new weakness tingling or numbness, No Cough - SOB. Tonic low back pain , better abdominal pain.  Objective:   Filed Vitals:   02/26/12 1522 02/26/12 1650 02/26/12 2154 02/27/12 0500  BP: 144/60 145/77 131/68 146/76  Pulse:  108 93 91  Temp:  97.5 F (36.4 C) 98.4 F (36.9 C) 98.2 F (36.8 C)  TempSrc:  Oral Oral Oral  Resp:  16 18 18   Height:  5\' 4"  (1.626 m)    Weight:  72.2 kg (159 lb 2.8 oz)  72.1 kg (158 lb 15.2 oz)  SpO2:  98% 94% 90%    Wt Readings from Last 3 Encounters:  02/27/12 72.1 kg (158 lb 15.2 oz)  02/01/12 67.132 kg (148 lb)  12/12/11 79.924 kg (176 lb 3.2 oz)     Intake/Output Summary (Last 24 hours) at 02/27/12 1101 Last data filed at 02/27/12 1004  Gross per 24 hour  Intake 695.83 ml  Output   2200 ml  Net -1504.17 ml    Exam Awake Alert, Oriented X 3, No new F.N deficits, Normal affect [place].AT,PERRAL Supple Neck,No JVD, No cervical lymphadenopathy appriciated.  Symmetrical Chest wall movement, Good air movement bilaterally, CTAB RRR,No Gallops,Rubs or new Murmurs, No Parasternal Heave +ve B.Sounds, Abd Soft, Non tender, No  organomegaly appriciated, No rebound - guarding or rigidity. No Cyanosis, Clubbing or edema, No new Rash or bruise      Data Review   CBC  Lab 02/27/12 0420 02/26/12 2237 02/26/12 2019 02/26/12 1656 02/26/12 1435 02/26/12 1109  WBC 9.3 -- -- -- -- 7.8  HGB 10.9* 10.1* 9.3* 11.4* 9.8* --  HCT 33.4* 30.9* 28.7* 35.3* 30.1* --  PLT 289 -- -- -- -- 278  MCV 83.1 -- -- -- -- 82.7  MCH 27.1 -- -- -- -- 26.8  MCHC 32.6 -- -- -- -- 32.4  RDW 14.9 -- -- -- -- 14.8  LYMPHSABS -- -- -- -- -- 2.1  MONOABS -- -- -- -- -- 0.7  EOSABS -- -- -- -- -- 0.2  BASOSABS -- -- -- -- -- 0.0  BANDABS -- -- -- -- -- --    Chemistries   Lab 02/27/12 0420 02/26/12 1109  NA 135 136  K 3.5 3.9  CL 96 98  CO2 28 31  GLUCOSE 162* 94  BUN 11 14  CREATININE 0.58 0.55  CALCIUM 8.8 9.0  MG -- --  AST -- 11  ALT -- 8  ALKPHOS -- 81  BILITOT -- 0.3   ------------------------------------------------------------------------------------------------------------------ estimated creatinine clearance is 58.3 ml/min (by C-G formula based on Cr of 0.58). ------------------------------------------------------------------------------------------------------------------ No results found for this basename: HGBA1C:2 in the last 72 hours ------------------------------------------------------------------------------------------------------------------ No results found for this basename: CHOL:2,HDL:2,LDLCALC:2,TRIG:2,CHOLHDL:2,LDLDIRECT:2 in the last 72 hours ------------------------------------------------------------------------------------------------------------------ No results found for this basename: TSH,T4TOTAL,FREET3,T3FREE,THYROIDAB in the last 72 hours ------------------------------------------------------------------------------------------------------------------ No results found for this basename: VITAMINB12:2,FOLATE:2,FERRITIN:2,TIBC:2,IRON:2,RETICCTPCT:2 in the last 72 hours  Coagulation profile  Lab  02/27/12 0420  INR 0.94  PROTIME --    No results found for this basename: DDIMER:2 in the last 72 hours  Cardiac Enzymes No results found for this basename: CK:3,CKMB:3,TROPONINI:3,MYOGLOBIN:3 in the last 168 hours ------------------------------------------------------------------------------------------------------------------ No components found with this basename: POCBNP:3  Micro Results Recent Results (from the past 240 hour(s))  MRSA PCR SCREENING     Status: Normal   Collection Time   02/26/12  7:28 PM      Component Value Range Status Comment   MRSA by PCR NEGATIVE  NEGATIVE Final     Radiology Reports Ct Head Wo Contrast  01/30/2012  *RADIOLOGY REPORT*  Clinical Data: Acute mental status changes.  CT HEAD WITHOUT CONTRAST  Technique:  Contiguous axial images were obtained  from the base of the skull through the vertex without contrast.  Comparison: None.  Findings: Moderate cortical atrophy and mild deep atrophy consistent with age.  Mild changes of small vessel disease of the white matter diffusely.  No mass lesion.  No midline shift.  No acute hemorrhage or hematoma.  No extra-axial fluid collections. No evidence of acute infarction.  Mild changes of hyperostosis frontalis interna.  Visualized paranasal sinuses, mastoid air cells, and middle ear cavities well- aerated.  Extensive bilateral carotid siphon and left vertebral artery atherosclerosis.  IMPRESSION:  1.  No acute intracranial abnormality. 2.  Mild to moderate generalized atrophy and mild chronic microvascular ischemic changes of the white matter diffusely.  Original Report Authenticated By: Arnell Sieving, M.D.   Ct Abdomen Pelvis W Contrast  02/26/2012  *RADIOLOGY REPORT*  Clinical Data: Back pain.  Lower GI bleed.  CT ABDOMEN AND PELVIS WITH CONTRAST  Technique:  Multidetector CT imaging of the abdomen and pelvis was performed following the standard protocol during bolus administration of intravenous contrast.   Contrast: OMNIPAQUE IOHEXOL 300 MG/ML  SOLN  Comparison: CT of abdomen and pelvis 12/09/2011.  Findings:  Lung Bases: Linear opacities in the lower lobes of the lungs bilaterally consistent with areas of subsegmental atelectasis and/or scarring.  Atherosclerosis.  Abdomen/Pelvis:  Status post cholecystectomy.  Mild intrahepatic biliary ductal dilatation.  Common bile duct measures up to 14 mm in the porta hepatis (increased).  The pancreas is atrophic.  No definite pancreatic ductal dilatation.  Spleen and bilateral adrenal glands are unremarkable.  Numerous low attenuation lesions in the kidneys bilaterally, many of which are too small to definitively characterize.  The largest renal lesion in the interpolar region of the left kidney measures 2.1 cm and is compatible with a simple cyst.  1.5 cm short-axis celiac axis lymph node has enlarged compared to prior study. No other definite pathologically enlarged lymph nodes are noted within the abdomen or pelvis.  Atherosclerosis of the abdominal and pelvic vasculature, without definite aneurysm or dissection. There is a large amount of well formed stool in the rectal vault and distal colon, suggestive of constipation.  No ascites or pneumoperitoneum and no pathologic distension of bowel. Asymmetric and irregular thickening of the urinary bladder wall, increased compared to prior examinations.  Musculoskeletal: Compared to prior examinations there is a new compression of L5 with approximately 30% loss of vertebral body height at this time.  Previously noted compression fracture of superior endplate of T12 with approximately 20% loss of height anteriorly is unchanged. There are no aggressive appearing lytic or blastic lesions noted in the visualized portions of the skeleton.  IMPRESSION: 1.  New compression fracture of L5 with approximately 30% loss of vertebral body height. 2.  Large volume of stool in the rectal vault and distal colon, suggesting constipation. 3.   Asymmetric bladder wall thickening which has significantly increased compared to prior examinations.  Clinical correlation and consideration for evaluation of urine cytology is recommended to exclude a urothelial neoplasm. 4.  Slight increase in dilatation of common bile duct and intrahepatic biliary ductal dilatation compared to the prior examination.  No definite ductal stone is identified.  Correlation for signs and symptoms of biliary tract obstruction is recommended, as this could be indicative of a distal ductal stricture.  No pancreatic ductal dilatation is noted. 5.  Single mildly enlarged celiac axis lymph node has increased in size compared to the prior examination, but is nonspecific. Attention on follow-up studies is recommended.  6.  Low attenuation renal lesions, the largest of which is compatible with a simple cyst in the left kidney, with the smaller lesions too small to definitively characterize. 7.  Atherosclerosis.  Original Report Authenticated By: Florencia Reasons, M.D.    Scheduled Meds:   . aspirin EC  81 mg Oral Daily  . bisacodyl  10 mg Oral Daily  . bisacodyl  10 mg Rectal Once  . cholecalciferol  1,000 Units Oral Daily  . docusate sodium  200 mg Oral BID  . DULoxetine  30 mg Oral Daily  . insulin aspart  0-9 Units Subcutaneous Q4H  . lidocaine  1 patch Transdermal Q24H  . LORazepam  0.5 mg Oral Q8H  . magnesium hydroxide  30 mL Oral NOW  . morphine  30 mg Oral Q12H  . pantoprazole (PROTONIX) IV  40 mg Intravenous Q12H  . polyethylene glycol  17 g Oral TID  . senna-docusate  2 tablet Oral Daily  . sodium chloride  500 mL Intravenous Once  . sodium chloride  3 mL Intravenous Q12H  . DISCONTD: sodium chloride   Intravenous STAT  . DISCONTD: methylnaltrexone  12 mg Subcutaneous Once  . DISCONTD: polyethylene glycol  17 g Oral Daily  . DISCONTD: senna-docusate  2 tablet Oral QHS   Continuous Infusions:   . dextrose 5 % and 0.45% NaCl 50 mL/hr (02/26/12 1829)   PRN  Meds:.albuterol, guaiFENesin-dextromethorphan, iohexol, morphine, morphine injection, ondansetron (ZOFRAN) IV, ondansetron

## 2012-02-27 NOTE — Progress Notes (Signed)
Subjective: Few episodes of brown stool admixed with red blood overnight; one episode this morning.  Objective: Vital signs in last 24 hours: Temp:  [97.5 F (36.4 C)-98.9 F (37.2 C)] 98.2 F (36.8 C) (07/20 0500) Pulse Rate:  [82-108] 91  (07/20 0500) Resp:  [16-29] 18  (07/20 0500) BP: (121-146)/(59-77) 146/76 mmHg (07/20 0500) SpO2:  [90 %-98 %] 90 % (07/20 0500) Weight:  [72.1 kg (158 lb 15.2 oz)-72.2 kg (159 lb 2.8 oz)] 72.1 kg (158 lb 15.2 oz) (07/20 0500) Weight change:  Last BM Date: 02/27/12  PE: GEN:  Chronically ill-appearing, is in NAD HEENT:  Slightly pale conjunctiva. ABD:  Mild distended; soft non-tender.  Lab Results: CBC    Component Value Date/Time   WBC 9.3 02/27/2012 0420   RBC 4.02 02/27/2012 0420   HGB 10.9* 02/27/2012 0420   HCT 33.4* 02/27/2012 0420   PLT 289 02/27/2012 0420   MCV 83.1 02/27/2012 0420   MCH 27.1 02/27/2012 0420   MCHC 32.6 02/27/2012 0420   RDW 14.9 02/27/2012 0420   LYMPHSABS 2.1 02/26/2012 1109   MONOABS 0.7 02/26/2012 1109   EOSABS 0.2 02/26/2012 1109   BASOSABS 0.0 02/26/2012 1109   Assessment:  1.  Hematochezia.  Last colonoscopy in 2009.  Mild but persistent.  No hemodynamic instability.  Diverticular bleed is leading consideration, stercoral ulcer remains possibility. 2.  Acute blood loss anemia.  Hgb this morning 10.9 is unchanged (cf 11.0) from her Hgb yesterday morning.  Plan:  1.  Clear liquid diet ok. 2.  If patient has escalating degree and frequency of bleeding, would obtain tagged RBC study. 3.  If patient has persistent slow oozing today, will consider sigmoidoscopy in the next day or two.  Given her multiple comorbidities (bedbound state, vertebral compression fractures, etc.), I would like to try to avoid endoscopic evaluation if possible.    [name] 02/27/2012, 11:10 AM

## 2012-02-28 ENCOUNTER — Encounter (HOSPITAL_COMMUNITY)
Admission: EM | Disposition: A | Discharge status: Skilled Nursing Facility | Payer: Self-pay | Source: Home / Self Care | Attending: Internal Medicine

## 2012-02-28 ENCOUNTER — Ambulatory Visit (HOSPITAL_COMMUNITY): Admit: 2012-02-28 | Payer: Self-pay | Admitting: Gastroenterology

## 2012-02-28 HISTORY — PX: FLEXIBLE SIGMOIDOSCOPY: SHX5431

## 2012-02-28 LAB — CBC
MCH: 26.5 pg (ref 26.0–34.0)
MCHC: 31.8 g/dL (ref 30.0–36.0)
MCV: 83.1 fL (ref 78.0–100.0)
Platelets: 238 10*3/uL (ref 150–400)
RDW: 15.1 % (ref 11.5–15.5)

## 2012-02-28 LAB — BASIC METABOLIC PANEL
CO2: 30 mEq/L (ref 19–32)
Chloride: 99 mEq/L (ref 96–112)
Potassium: 3.7 mEq/L (ref 3.5–5.1)
Sodium: 135 mEq/L (ref 135–145)

## 2012-02-28 LAB — GLUCOSE, CAPILLARY
Glucose-Capillary: 118 mg/dL — ABNORMAL HIGH (ref 70–99)
Glucose-Capillary: 134 mg/dL — ABNORMAL HIGH (ref 70–99)
Glucose-Capillary: 145 mg/dL — ABNORMAL HIGH (ref 70–99)

## 2012-02-28 SURGERY — SIGMOIDOSCOPY, FLEXIBLE
Anesthesia: Moderate Sedation

## 2012-02-28 MED ORDER — FENTANYL CITRATE 0.05 MG/ML IJ SOLN
INTRAMUSCULAR | Status: DC | PRN
  Administered 2012-02-28: 25 ug via INTRAVENOUS

## 2012-02-28 MED ORDER — FLEET ENEMA 7-19 GM/118ML RE ENEM
1.0000 | ENEMA | Freq: Once | RECTAL | Status: AC
  Administered 2012-02-28: 10:00:00 via RECTAL
  Filled 2012-02-28: qty 1

## 2012-02-28 MED ORDER — METRONIDAZOLE IN NACL 5-0.79 MG/ML-% IV SOLN
500.0000 mg | Freq: Three times a day (TID) | INTRAVENOUS | Status: DC
  Filled 2012-02-28 (×2): qty 100

## 2012-02-28 MED ORDER — SODIUM CHLORIDE 0.9 % IV SOLN: Freq: Once | INTRAVENOUS | Status: DC

## 2012-02-28 MED ORDER — MIDAZOLAM HCL 10 MG/2ML IJ SOLN
INTRAMUSCULAR | Status: DC | PRN
  Administered 2012-02-28: 1 mg via INTRAVENOUS

## 2012-02-28 MED ORDER — SODIUM CHLORIDE 0.9 % IJ SOLN
INTRAMUSCULAR | Status: DC | PRN
  Administered 2012-02-28: 12:00:00

## 2012-02-28 MED ORDER — CIPROFLOXACIN IN D5W 400 MG/200ML IV SOLN
400.0000 mg | Freq: Two times a day (BID) | INTRAVENOUS | Status: DC
  Filled 2012-02-28: qty 200

## 2012-02-28 MED ORDER — PEG 3350-KCL-NA BICARB-NACL 420 G PO SOLR
4000.0000 mL | Freq: Once | ORAL | Status: AC
  Administered 2012-02-29: 4000 mL via ORAL

## 2012-02-28 MED ORDER — SODIUM CHLORIDE 0.9 % IV SOLN
INTRAVENOUS | Status: DC
  Administered 2012-02-28 – 2012-02-29 (×2): 75 mL/h via INTRAVENOUS
  Administered 2012-03-01 (×2): via INTRAVENOUS

## 2012-02-28 NOTE — Interval H&P Note (Signed)
History and Physical Interval Note:  02/28/2012 11:57 AM  [name]  has presented today for surgery, with the diagnosis of gi bleed  The various methods of treatment have been discussed with the patient and family. After consideration of risks, benefits and other options for treatment, the patient has consented to  Procedure(s) (LRB): FLEXIBLE SIGMOIDOSCOPY (N/A) as a surgical intervention .  The patient's history has been reviewed, patient examined, no change in status, stable for surgery.  I have reviewed the patient's chart and labs.  Questions were answered to the patient's satisfaction.     Freddy Jaksch

## 2012-02-28 NOTE — Progress Notes (Signed)
Subjective: Pt c/o pain all over Lower abd x2 episode of rectal bleed last  One at 10 pm mod - report fresh blood and clots C/o abd pain also CT- no diverticulitis,  Chronic constipation, narcotic use chronic- pain  Objective: Vital signs in last 24 hours: Temp:  [98.4 F (36.9 C)-98.6 F (37 C)] 98.4 F (36.9 C) (07/21 0549) Pulse Rate:  [84-108] 108  (07/21 0549) Resp:  [18] 18  (07/21 0549) BP: (109-129)/(66-70) 109/66 mmHg (07/21 0549) SpO2:  [93 %-96 %] 93 % (07/21 0549) Weight:  [72.7 kg (160 lb 4.4 oz)] 72.7 kg (160 lb 4.4 oz) (07/21 0549) Weight change: 0.5 kg (1 lb 1.6 oz) Last BM Date: 02/27/12  Intake/Output from previous day: 07/20 0701 - 07/21 0700 In: 860 [P.O.:360; I.V.:500] Out: 3600 [Urine:3600] Intake/Output this shift:    General appearance: alert Cardio: regular rate and rhythm GI: soft tender lower abd BS ok  Lab Results:  [facility] 02/28/12 0518 02/27/12 2113 02/27/12 0420  WBC 7.3 -- 9.3  HGB 9.1* 9.2* --  HCT 28.6* 27.8* --  PLT 238 -- 289   BMET  Basename 02/28/12 0518 02/27/12 0420  NA 135 135  K 3.7 3.5  CL 99 96  CO2 30 28  GLUCOSE 150* 162*  BUN 9 11  CREATININE 0.48* 0.58  CALCIUM 8.2* 8.8    Studies/Results: Ct Abdomen Pelvis W Contrast  02/26/2012  *RADIOLOGY REPORT*  Clinical Data: Back pain.  Lower GI bleed.  CT ABDOMEN AND PELVIS WITH CONTRAST  Technique:  Multidetector CT imaging of the abdomen and pelvis was performed following the standard protocol during bolus administration of intravenous contrast.  Contrast: OMNIPAQUE IOHEXOL 300 MG/ML  SOLN  Comparison: CT of abdomen and pelvis 12/09/2011.  Findings:  Lung Bases: Linear opacities in the lower lobes of the lungs bilaterally consistent with areas of subsegmental atelectasis and/or scarring.  Atherosclerosis.  Abdomen/Pelvis:  Status post cholecystectomy.  Mild intrahepatic biliary ductal dilatation.  Common bile duct measures up to 14 mm in the porta hepatis  (increased).  The pancreas is atrophic.  No definite pancreatic ductal dilatation.  Spleen and bilateral adrenal glands are unremarkable.  Numerous low attenuation lesions in the kidneys bilaterally, many of which are too small to definitively characterize.  The largest renal lesion in the interpolar region of the left kidney measures 2.1 cm and is compatible with a simple cyst.  1.5 cm short-axis celiac axis lymph node has enlarged compared to prior study. No other definite pathologically enlarged lymph nodes are noted within the abdomen or pelvis.  Atherosclerosis of the abdominal and pelvic vasculature, without definite aneurysm or dissection. There is a large amount of well formed stool in the rectal vault and distal colon, suggestive of constipation.  No ascites or pneumoperitoneum and no pathologic distension of bowel. Asymmetric and irregular thickening of the urinary bladder wall, increased compared to prior examinations.  Musculoskeletal: Compared to prior examinations there is a new compression of L5 with approximately 30% loss of vertebral body height at this time.  Previously noted compression fracture of superior endplate of T12 with approximately 20% loss of height anteriorly is unchanged. There are no aggressive appearing lytic or blastic lesions noted in the visualized portions of the skeleton.  IMPRESSION: 1.  New compression fracture of L5 with approximately 30% loss of vertebral body height. 2.  Large volume of stool in the rectal vault and distal colon, suggesting constipation. 3.  Asymmetric bladder wall thickening which has significantly increased  compared to prior examinations.  Clinical correlation and consideration for evaluation of urine cytology is recommended to exclude a urothelial neoplasm. 4.  Slight increase in dilatation of common bile duct and intrahepatic biliary ductal dilatation compared to the prior examination.  No definite ductal stone is identified.  Correlation for signs and  symptoms of biliary tract obstruction is recommended, as this could be indicative of a distal ductal stricture.  No pancreatic ductal dilatation is noted. 5.  Single mildly enlarged celiac axis lymph node has increased in size compared to the prior examination, but is nonspecific. Attention on follow-up studies is recommended. 6.  Low attenuation renal lesions, the largest of which is compatible with a simple cyst in the left kidney, with the smaller lesions too small to definitively characterize. 7.  Atherosclerosis.  Original Report Authenticated By: Florencia Reasons, M.D.    Medications: I have reviewed the patient's current medications.  Assessment/Plan: LGI Bleed Constipation DDX diverticular or rectal tear? GI following No acute diverticulitis-   Hold off ABX Blood count stable Bowel regimen-  ? Enema would be helpful Chronic back pain- on narcotic- still continue to have pain DM BS- ok Mild Tacy, low BP- IVF; Clear diet. HTN: BP low- no med  LOS: 2 days   Tanganyika Bowlds 02/28/2012, 8:24 AM

## 2012-02-28 NOTE — Op Note (Signed)
[facility] [address] [place], [place]  [address]  FLEXIBLE SIGMOIDOSCOPY PROCEDURE REPORT  PATIENT:  [name], [name]  MR#:  [id number] BIRTHDATE:  06-23-1933, 78 yrs. old  GENDER:  female  ENDOSCOPIST:  Willis Modena, MD Referred by:  Kirby Funk, M.D.  PROCEDURE DATE:  02/28/2012 PROCEDURE:  Flexible Sigmoidoscopy for control of bleeding, Flexible Sigmoidoscopy with Submucosal Injection ASA CLASS:  Class III INDICATIONS:  constipation, hematochezia  MEDICATIONS:   Fentanyl 25 mcg IV, Versed 1 mg IV  DESCRIPTION OF PROCEDURE:   After the risks benefits and alternatives of the procedure were thoroughly explained, informed consent was obtained.  palpable stool The [id number] endoscope was introduced through the anus and advanced to the rectum, without limitations.  The quality of the prep was  oor.  The instrument was then slowly withdrawn as the mucosa was fully examined.  <<PROCEDUREIMAGES>>  FINDINGS:  Digital rectal exam:  Small rectal prolapse; poor anal sphincter tone; external hemorrhoids; palpable stool in rectal vault.  I proceeded with manual disempaction, with large amount of dark brown stool extracted.  Colonoscope subsequently inserted. Large amount of residual stool in rectal vault; this stool appeared normal and there were no clots or fresh blood seen.  Some red streaks in distal rectum likely trauma from recent enema.  After extensive lavage, I was able to appreciate a couple fairly small but moderately deep ulcers in the very distal rectum.  One of this ulcers had a visible vessel.  1.5 cc of diluted 1:10,00 EPI was injected around the ulcer with good blanching effect. Subsequently, the vessel was obliterated with BiCap electrocautery (20W). There were no immediate complications.  It should be noted that a sizeable portion of the rectum, despite extensive lavage, was still unable to be visualized due to large amount of stool seen.  ENDOSCOPIC  IMPRESSION:    1.  Suspected constipation-related stercoral ulcers, one requiring endoscopic therapy as above. 2.  No old or fresh blood seen. 3.  Suboptimal views of rectum, as described above.  RECOMMENDATIONS:      1.  Watch for potential complicatons of procedure. 2.  Golytely bowel prep, one half today and other one half tomorrow. 3.  Henceforth, will need aggressive bowel regimen to avoid this happening again. 4.  If rebleeding, would repeat sigmoidoscopy.  ______________________________ Willis Modena  CC:  n. eSIGNEDWillis Modena at 02/28/2012 12:32 PM  Particia Lather, [phone]

## 2012-02-28 NOTE — Progress Notes (Signed)
Pt to endoscopy lab via bed.

## 2012-02-28 NOTE — H&P (View-Only) (Signed)
Subjective: Still experiencing low back and leg pain.  However, having more trouble with nausea and vomiting. Did have small bowel movement yesterday  Objective: Vital signs in last 24 hours: Temp:  [98.2 F (36.8 C)-98.7 F (37.1 C)] 98.2 F (36.8 C) (06/29 0433) Pulse Rate:  [76-110] 110  (06/29 0433) Resp:  [16-20] 20  (06/29 0433) BP: (149-158)/(62-91) 158/91 mmHg (06/29 0433) SpO2:  [94 %-95 %] 95 % (06/29 0433) Weight change:  Last BM Date: 02/05/12  Intake/Output from previous day: 06/28 0701 - 06/29 0700 In: 240 [P.O.:240] Out: 650 [Urine:650] Intake/Output this shift:    Resp: clear to auscultation bilaterally Cardio: regular rate and rhythm, S1, S2 normal, no murmur, click, rub or gallop GI: soft, non-tender; bowel sounds normal; no masses,  no organomegaly and mild tenderness left side of abdomen  Lab Results: No results found for this basename: WBC:2,HGB:2,HCT:2,PLT:2 in the last 72 hours BMET  Basename 02/06/12 0349 02/05/12 0337  NA 137 140  K 3.7 2.9*  CL 99 102  CO2 31 32  GLUCOSE 145* 138*  BUN 19 18  CREATININE 0.55 0.55  CALCIUM 8.9 8.7    Studies/Results: No results found.  Medications: I have reviewed the patient's current medications.  Assessment/Plan: HTN good control Nausea likely secondary to dilaudid, await input from palliative care DM good control Potassium now normal Await NHP  LOS: 11 days   [name] 02/06/2012, 7:12 AM   

## 2012-02-28 NOTE — Progress Notes (Signed)
Pt with moderate amount of bright red blood from rectum on pads, enema given as ordered. Pt tolerated without problem.

## 2012-02-29 ENCOUNTER — Encounter (HOSPITAL_COMMUNITY): Payer: Self-pay | Admitting: Gastroenterology

## 2012-02-29 LAB — GLUCOSE, CAPILLARY
Glucose-Capillary: 212 mg/dL — ABNORMAL HIGH (ref 70–99)
Glucose-Capillary: 148 mg/dL — ABNORMAL HIGH (ref 70–99)
Glucose-Capillary: 125 mg/dL — ABNORMAL HIGH (ref 70–99)
Glucose-Capillary: 165 mg/dL — ABNORMAL HIGH (ref 70–99)

## 2012-02-29 LAB — CBC: HCT: 25.4 % — ABNORMAL LOW (ref 36.0–46.0)

## 2012-02-29 MED ORDER — INSULIN GLARGINE 100 UNIT/ML ~~LOC~~ SOLN
10.0000 [IU] | Freq: Every day | SUBCUTANEOUS | Status: DC
  Administered 2012-02-29 – 2012-03-03 (×4): 10 [IU] via SUBCUTANEOUS

## 2012-02-29 NOTE — Progress Notes (Signed)
Patient's hemoglobin is down 1 g compared to yesterday, but per discussion with the patient's nurse, there has apparently been no overt bleeding recently.  For unclear [name], the patient did not get her first half of the GoLYTELY colonic lavage yesterday, so we will aim for that today.  In the absence of ongoing or acute bleeding, I do not feel that repeat sigmoidoscopic evaluation is necessary.  We will continue to follow this patient with you.  [name], M.D. [phone]

## 2012-02-29 NOTE — Progress Notes (Signed)
Subjective: Some BM, no bleeding  Objective: Vital signs in last 24 hours: Temp:  [97.3 F (36.3 C)-98.6 F (37 C)] 98.1 F (36.7 C) (07/22 0533) Pulse Rate:  [81-114] 81  (07/22 0533) Resp:  [12-30] 18  (07/22 0533) BP: (110-142)/(55-73) 111/62 mmHg (07/22 0533) SpO2:  [92 %-100 %] 94 % (07/22 0533) Weight:  [72.4 kg (159 lb 9.8 oz)] 72.4 kg (159 lb 9.8 oz) (07/22 0533) Weight change: -0.3 kg (-10.6 oz) Last BM Date: 02/27/12  Intake/Output from previous day: 07/21 0701 - 07/22 0700 In: -  Out: 1000 [Urine:1000] Intake/Output this shift: Total I/O In: -  Out: 1000 [Urine:1000]  General appearance: alert and cooperative Resp: clear to auscultation bilaterally Cardio: regular rate and rhythm, S1, S2 normal, no murmur, click, rub or gallop GI: soft, non-tender; bowel sounds normal; no masses,  no organomegaly  Lab Results:  [facility] 02/29/12 0421 02/28/12 0518  WBC 7.8 7.3  HGB 8.3* 9.1*  HCT 25.4* 28.6*  PLT 255 238   BMET  Basename 02/28/12 0518 02/27/12 0420  NA 135 135  K 3.7 3.5  CL 99 96  CO2 30 28  GLUCOSE 150* 162*  BUN 9 11  CREATININE 0.48* 0.58  CALCIUM 8.2* 8.8    Studies/Results: No results found.  Medications: I have reviewed the patient's current medications.  Assessment/Plan: Principal Problem:  *Lower GI bleeding  Follow hgb, no indication for transfusion, Flex sig noted Active Problems:  Constipation golytely prep  Diabetes mellitus fair control, add lantus  Low back pain radiating to left leg continue narcotics and cymbalta  HTN (hypertension) ok    LOS: 3 days   Kaysen Sefcik JOSEPH 02/29/2012, 6:12 AM

## 2012-02-29 NOTE — Progress Notes (Signed)
Golytely started this morning, patient able to tolerating well. Had BM in moderate amount soft and brown, no signs of bleeding noted at this time.

## 2012-02-29 NOTE — Progress Notes (Signed)
Clinical Social Work Department BRIEF PSYCHOSOCIAL ASSESSMENT 02/29/2012  Patient:  [name], [name]     Account Number:  [id number]     Admit date:  02/26/2012  Clinical Social Worker:  Orpah Greek  Date/Time:  02/29/2012 04:23 PM  Referred by:  Physician  Date Referred:  02/29/2012 Referred for  Other - See comment   Other Referral:   Admitted from: [facility] (short-term rehab)   Interview type:  Patient Other interview type:    PSYCHOSOCIAL DATA Living Status:  FACILITY Admitted from facility:  [facility] Level of care:  Skilled Nursing Facility Primary support name:  [name] (husband) h#: [phone] Primary support relationship to patient:  SPOUSE Degree of support available:   good    CURRENT CONCERNS Current Concerns  Post-Acute Placement   Other Concerns:    SOCIAL WORK ASSESSMENT / PLAN CSW spoke with patient re: discharge planning. Patient was admitted from [facility] where she was there for short-term rehab. She plans to return there to complete her rehab course at discharge.   Assessment/plan status:  Information/Referral to [facility] Other assessment/ plan:   Information/referral to community resources:   CSW completed FL2 and faxed information to [employer], spoke with [facility] @ SNF who stated they would be able to take patient, pending bed availablility.    PATIENT'S/FAMILY'S RESPONSE TO PLAN OF CARE: Patient is hopeful that they will have a bed available @ [facility] at discharge.        Unice Bailey, LCSW [facility] Clinical Social Worker cell #: [phone]

## 2012-03-01 LAB — CBC
Hemoglobin: 9.4 g/dL — ABNORMAL LOW (ref 12.0–15.0)
MCH: 26.9 pg (ref 26.0–34.0)
MCHC: 32.3 g/dL (ref 30.0–36.0)
MCV: 83.1 fL (ref 78.0–100.0)
RBC: 3.5 MIL/uL — ABNORMAL LOW (ref 3.87–5.11)

## 2012-03-01 LAB — GLUCOSE, CAPILLARY
Glucose-Capillary: 113 mg/dL — ABNORMAL HIGH (ref 70–99)
Glucose-Capillary: 118 mg/dL — ABNORMAL HIGH (ref 70–99)
Glucose-Capillary: 129 mg/dL — ABNORMAL HIGH (ref 70–99)
Glucose-Capillary: 133 mg/dL — ABNORMAL HIGH (ref 70–99)
Glucose-Capillary: 137 mg/dL — ABNORMAL HIGH (ref 70–99)
Glucose-Capillary: 153 mg/dL — ABNORMAL HIGH (ref 70–99)

## 2012-03-01 NOTE — Progress Notes (Signed)
Hemoglobin unchanged from 2 days ago, improved from yesterday.  Patient's somnolent today.  Patient has consumed almost the entire gallon Nu-Lytely, and has had 2 large bowel movements, the first of which have a lot of formed stool, the second of which has thick liquid stool (per conversation with nurse). No evidence of recurrent rectal bleeding.  Impression: Resolution of GI bleeding related to stercoral ulceration secondary to constipation (from talking with the patient's son at the bedside, it sounds as though she was not continued on MiraLAX, which had worked well for her as an outpatient, when she was recently in a nursing home for several weeks).  Recommendation:  1. Okay for discharge at any time from the GI tract standpoint. The patient is probably quite low risk for recurrent bleeding from her stercoral ulcerations at this point. 2. Care will be needed to maintain adequate defecation when she returns to the nursing home. I would recommend MiraLAX once daily, with the dose titrated up or down so as to achieve one or 2 soft bowel movements per day.  I will plan to sign off at this time. Please call if you have questions pertaining to her case or if I can be of further assistance in her care.  Florencia Reasons, M.D. [phone]

## 2012-03-01 NOTE — Progress Notes (Signed)
Patient had a large BM brown  formed /soft,loose (on GoLytely),  no  signs of bleeding noted. Patient verbalized relief.

## 2012-03-01 NOTE — Progress Notes (Signed)
Subjective: 3 BMS.  No blood.  NOt hungry  Objective: Vital signs in last 24 hours: Temp:  [97.7 F (36.5 C)-98.3 F (36.8 C)] 98.2 F (36.8 C) (07/23 0515) Pulse Rate:  [86-105] 105  (07/23 0515) Resp:  [20] 20  (07/23 0515) BP: (134-159)/(69-76) 159/76 mmHg (07/23 0515) SpO2:  [92 %-94 %] 92 % (07/23 0515) Weight:  [75 kg (165 lb 5.5 oz)] 75 kg (165 lb 5.5 oz) (07/23 0515) Weight change: 2.6 kg (5 lb 11.7 oz) Last BM Date: 02/29/12  Intake/Output from previous day: 07/22 0701 - 07/23 0700 In: 1020 [P.O.:720; I.V.:300] Out: 1825 [Urine:1825] Intake/Output this shift:    General appearance: alert and cooperative Resp: clear to auscultation bilaterally Cardio: regular rate and rhythm, S1, S2 normal, no murmur, click, rub or gallop GI: soft, non-tender; bowel sounds normal; no masses,  no organomegaly Extremities: extremities normal, atraumatic, no cyanosis or edema  Lab Results:  Basename 02/29/12 0421 02/28/12 0518  WBC 7.8 7.3  HGB 8.3* 9.1*  HCT 25.4* 28.6*  PLT 255 238   BMET  Basename 02/28/12 0518  NA 135  K 3.7  CL 99  CO2 30  GLUCOSE 150*  BUN 9  CREATININE 0.48*  CALCIUM 8.2*    Studies/Results: No results found.  Medications: I have reviewed the patient's current medications.  Assessment/Plan: Principal Problem:  *Lower GI bleeding appears to have stopped.  Hgb pending.  She wants to continue clear liquids one more day Active Problems:  Constipation continue golytely, having good result.  Was on Miralax 17 qd and Senekot S 2 qd at nursing home,  GI, would you suggest another bowel regimen after discharge  Diabetes mellitus better control with addition of lantus to SSI  Low back pain radiating to left leg,  continue current pain control regimen  HTN (hypertension) ok   LOS: 4 days   [name] JOSEPH 03/01/2012, 7:20 AM

## 2012-03-02 LAB — GLUCOSE, CAPILLARY
Glucose-Capillary: 241 mg/dL — ABNORMAL HIGH (ref 70–99)
Glucose-Capillary: 94 mg/dL (ref 70–99)

## 2012-03-02 LAB — BASIC METABOLIC PANEL
Chloride: 101 mEq/L (ref 96–112)
GFR calc Af Amer: >90 mL/min (ref >90)
GFR calc non Af Amer: >90 mL/min (ref >90)
Potassium: 2.6 mEq/L — CL (ref 3.5–5.1)
Sodium: 139 mEq/L (ref 135–145)

## 2012-03-02 LAB — CBC
Hemoglobin: 8.9 g/dL — ABNORMAL LOW (ref 12.0–15.0)
MCHC: 32.6 g/dL (ref 30.0–36.0)
RBC: 3.32 MIL/uL — ABNORMAL LOW (ref 3.87–5.11)
WBC: 6.2 10*3/uL (ref 4.0–10.5)

## 2012-03-02 LAB — URINE CULTURE: Colony Count: 40000

## 2012-03-02 MED ORDER — SENNOSIDES-DOCUSATE SODIUM 8.6-50 MG PO TABS
2.0000 | ORAL_TABLET | Freq: Every day | ORAL | Status: DC
  Administered 2012-03-02: 2 via ORAL
  Filled 2012-03-02 (×3): qty 2

## 2012-03-02 MED ORDER — POTASSIUM CHLORIDE CRYS ER 20 MEQ PO TBCR
20.0000 meq | EXTENDED_RELEASE_TABLET | Freq: Three times a day (TID) | ORAL | Status: DC
  Administered 2012-03-02 – 2012-03-03 (×4): 20 meq via ORAL
  Filled 2012-03-02 (×6): qty 1

## 2012-03-02 MED ORDER — POLYETHYLENE GLYCOL 3350 17 G PO PACK
17.0000 g | PACK | Freq: Two times a day (BID) | ORAL | Status: DC
  Administered 2012-03-02 (×2): 17 g via ORAL
  Filled 2012-03-02 (×4): qty 1

## 2012-03-02 MED ORDER — POTASSIUM CHLORIDE 10 MEQ/100ML IV SOLN
10.0000 meq | INTRAVENOUS | Status: AC
  Administered 2012-03-02 (×4): 10 meq via INTRAVENOUS
  Filled 2012-03-02 (×4): qty 100

## 2012-03-02 NOTE — Progress Notes (Signed)
CRITICAL VALUE ALERT  Critical value received:  K+ 2.6  Date of notification:  03/02/12  Time of notification:  0611  Critical value read back:yes  Nurse who received alert:  Judi Cong, RN  MD notified (1st page):  Dr. Valentina Lucks  Time of first page:  [phone]  MD notified (2nd page):  Time of second page:  Responding MD:  Dr. Valentina Lucks  Time MD responded:  [phone]

## 2012-03-02 NOTE — Progress Notes (Signed)
Subjective: 2 BMs yesterday.  No bleeding.  Some confusion.  Still in pain  Objective: Vital signs in last 24 hours: Temp:  [97.8 F (36.6 C)-98.7 F (37.1 C)] 98.2 F (36.8 C) (07/24 0522) Pulse Rate:  [81-108] 108  (07/24 0522) Resp:  [20-22] 20  (07/24 0522) BP: (132-172)/(67-76) 138/67 mmHg (07/24 0522) SpO2:  [92 %-95 %] 95 % (07/24 0522) Weight:  [72.6 kg (160 lb 0.9 oz)] 72.6 kg (160 lb 0.9 oz) (07/24 0500) Weight change: -2.4 kg (-5 lb 4.7 oz) Last BM Date: 03/01/12  Intake/Output from previous day: 07/23 0701 - 07/24 0700 In: 240 [P.O.:240] Out: 1950 [Urine:1950] Intake/Output this shift:    General appearance: alert GI: soft, non-tender; bowel sounds normal; no masses,  no organomegaly  Lab Results:  [facility] 03/02/12 0420 03/01/12 0655  WBC 6.2 6.2  HGB 8.9* 9.4*  HCT 27.3* 29.1*  PLT 282 272   BMET  Basename 03/02/12 0420  NA 139  K 2.6*  CL 101  CO2 31  GLUCOSE 103*  BUN 3*  CREATININE 0.51  CALCIUM 8.1*    Studies/Results: No results found.  Medications: I have reviewed the patient's current medications.  Assessment/Plan: Principal Problem:  *Lower GI bleeding resolved Active Problems:  Constipation improving, will treat with miralax bid for now, restart senekot S  Diabetes mellitus controlled   Low back pain radiating to left leg continue medical therapy  HTN (hypertension) ok  Disposition  Should be ready to return to NH in am   LOS: 5 days   Sarrinah Gardin JOSEPH 03/02/2012, 7:24 AM

## 2012-03-03 LAB — GLUCOSE, CAPILLARY: Glucose-Capillary: 136 mg/dL — ABNORMAL HIGH (ref 70–99)

## 2012-03-03 LAB — BASIC METABOLIC PANEL
Chloride: 98 mEq/L (ref 96–112)
GFR calc non Af Amer: >90 mL/min (ref >90)
Glucose, Bld: 204 mg/dL — ABNORMAL HIGH (ref 70–99)
Potassium: 3.2 mEq/L — ABNORMAL LOW (ref 3.5–5.1)
Sodium: 135 mEq/L (ref 135–145)

## 2012-03-03 MED ORDER — POLYETHYLENE GLYCOL 3350 17 G PO PACK: 17.0000 g | PACK | Freq: Two times a day (BID) | ORAL | Status: DC

## 2012-03-03 MED ORDER — POLYETHYLENE GLYCOL 3350 17 G PO PACK: 17.0000 g | PACK | Freq: Every day | ORAL | Status: DC

## 2012-03-03 MED ORDER — POTASSIUM CHLORIDE CRYS ER 20 MEQ PO TBCR: 20.0000 meq | EXTENDED_RELEASE_TABLET | Freq: Three times a day (TID) | ORAL | Status: DC

## 2012-03-03 NOTE — Discharge Summary (Addendum)
Physician Discharge Summary  Patient ID: [name] MRN: [id number] DOB/AGE: 76-Sep-1934 76 y.o.  Admit date: 02/26/2012 Discharge date: 03/03/2012  Admission Diagnoses: Lower GI bleeding Constipation Diabetes mellitus Spinal stenosis Hypertension  Discharge Diagnoses:  Principal Problem:  *Lower GI bleeding Active Problems:  Constipation Rectal ulcer  Diabetes mellitus  Spinal stenosis  HTN (hypertension)  Hypokalemia   Discharged Condition: good  Hospital Course: The patient was admitted on July 19 with primary blood per rectum hemoglobin down a grandmother hand compared to the last hemoglobin at last admission and constipation from high-dose narcotics. At admission hemoglobin 11.0. Electrolytes were normal. The patient was admitted for lower GI bleed. A CT scan of the abdomen and pelvis were done and showed a new compression fracture at L5, large-volume of stool in the rectal vault, asymmetric bladder wall thickening and renal cysts. The patient was seen by the gastroenterology service. A flexible sigmoidoscopy was performed and showed 2 stercoral rectal ulcers, one of which had a visible vessel was injected with epinephrine and treated with BiCAP successfully. The patient had no further bleeding after this point. Hemoglobin stabilized at about 9, no transfusion was necessary. The patient was treated aggressively for her constipation with GoLYTELY and then started on MiraLAX twice a day. At discharge the patient was having frequent loose stools and her MiraLAX was decreased to once a day. Her bowel regimen will need to be followed carefully as an outpatient. She was also restarted on Senokot. She has some hypokalemia which was repleted orally and will continue for 2 days as an outpatient. The patient does continue to have severe back and leg pain which makes it very difficult for her to do physical therapy. Her case was discussed with her neurosurgeon Dr. Newell Coral. Dr. Newell Coral  discussed the case with the patient's son Tresa Endo and the plan was made to continue pain control and physical therapy for 3-4 weeks with follow up with neurosurgery and consideration for surgery at that time if pain is not improving. The patient's blood pressure remained under reasonable control during admission without any medication for blood pressure. Her diabetes is under good control and she is eating a carb modified diet at discharge.  CODE STATUS no CODE BLUE Diet carb modified diet Activity as per physical and occupational therapy TED hose stockings applied daily in a.m. and off at bedtime Blood sugar a.c. and at bedtime   Consults: GI  Significant Diagnostic Studies: labs: At discharge potassium 3.2, BUN 4, creatinine 0.85, hemoglobin 8.9, radiology: CT scan: As above and endoscopy: flexible sigmoidoscopy as above  Treatments: IV hydration and procedures: Flexible sigmoidoscopy with epinephrine injection and BiCAP  Discharge Exam: Blood pressure 161/70, pulse 83, temperature 98.3 F (36.8 C), temperature source Oral, resp. rate 20, height 5\' 4"  (1.626 m), weight 72.3 kg (159 lb 6.3 oz), SpO2 94.00%. Resp: clear to auscultation bilaterally Cardio: regular rate and rhythm, S1, S2 normal, no murmur, click, rub or gallop GI: soft, non-tender; bowel sounds normal; no masses,  no organomegaly  Disposition: 03-Skilled Nursing Facility  Discharge Orders    Future Appointments: Provider: Department: Dept Phone: Center:   03/14/2012 7:45 AM Sherrie George, MD [facility] [phone] None     Medication List  As of 03/03/2012  7:07 AM   TAKE these medications         acetaminophen 650 MG CR tablet   Commonly known as: TYLENOL   Take 650 mg by mouth every 6 (six) hours as needed. For pain.  aspirin EC 81 MG tablet   Take 81 mg by mouth daily.      cholecalciferol 1000 UNITS tablet   Commonly known as: VITAMIN D   Take 1,000 Units by mouth daily.      DULoxetine 30 MG  capsule   Commonly known as: CYMBALTA   Take 1 capsule (30 mg total) by mouth daily.      insulin aspart 100 UNIT/ML injection   Commonly known as: novoLOG   Inject 3 Units into the skin 3 (three) times daily with meals.      insulin glargine 100 UNIT/ML injection   Commonly known as: LANTUS   Inject 30 Units into the skin daily.      lansoprazole 30 MG capsule   Commonly known as: PREVACID   Take 30 mg by mouth daily.      lidocaine 5 %   Commonly known as: LIDODERM   Place 1 patch onto the skin daily. Remove & Discard patch within 12 hours or as directed by MD      LORazepam 0.5 MG tablet   Commonly known as: ATIVAN   Take 0.5 mg by mouth every 8 (eight) hours. For anxiety.      morphine 30 MG 12 hr tablet   Commonly known as: MS CONTIN   Take 1 tablet (30 mg total) by mouth every 12 (twelve) hours.      morphine 20 MG/ML concentrated solution   Commonly known as: ROXANOL   Take 5 mg by mouth every 4 (four) hours as needed. For pain.      ondansetron 4 MG tablet   Commonly known as: ZOFRAN   Take 4 mg by mouth every 6 (six) hours as needed. For nausea.      polyethylene glycol packet   Commonly known as: MIRALAX / GLYCOLAX   Take 17 g by mouth 1 (one) time daily.      potassium chloride SA 20 MEQ tablet   Commonly known as: K-DUR,KLOR-CON   Take 1 tablet (20 mEq total) by mouth 3 (three) times daily.      senna-docusate 8.6-50 MG per tablet   Commonly known as: Senokot-S   Take 2 tablets by mouth at bedtime.           Follow-up Information                Follow up with Lillia Mountain, MD. (after discharge from nursing home)    Contact information:   [address], [address] [employer], [place]. [place] [address] [phone]          Signed: Lillia Mountain 03/03/2012, 7:07 AM

## 2012-03-03 NOTE — Progress Notes (Signed)
Patient is set to return to [facility] SNF today. Patient & husband at bedside aware. [facility] called for transport.   Unice Bailey, LCSW [facility] Clinical Social Worker cell #: [phone]

## 2012-03-12 ENCOUNTER — Emergency Department (HOSPITAL_COMMUNITY): Payer: Medicare Other

## 2012-03-12 ENCOUNTER — Emergency Department (HOSPITAL_COMMUNITY)
Admission: EM | Admit: 2012-03-12 | Discharge: 2012-03-12 | Disposition: A | Discharge status: Skilled Nursing Facility | Payer: Medicare Other | Attending: Emergency Medicine | Admitting: Emergency Medicine

## 2012-03-12 ENCOUNTER — Encounter (HOSPITAL_COMMUNITY): Payer: Self-pay | Admitting: Emergency Medicine

## 2012-03-12 DIAGNOSIS — M545 Low back pain, unspecified: Secondary | ICD-10-CM | POA: Insufficient documentation

## 2012-03-12 DIAGNOSIS — I10 Essential (primary) hypertension: Secondary | ICD-10-CM | POA: Insufficient documentation

## 2012-03-12 DIAGNOSIS — M25519 Pain in unspecified shoulder: Secondary | ICD-10-CM | POA: Insufficient documentation

## 2012-03-12 DIAGNOSIS — Z794 Long term (current) use of insulin: Secondary | ICD-10-CM | POA: Insufficient documentation

## 2012-03-12 DIAGNOSIS — E1142 Type 2 diabetes mellitus with diabetic polyneuropathy: Secondary | ICD-10-CM | POA: Insufficient documentation

## 2012-03-12 DIAGNOSIS — W050XXA Fall from non-moving wheelchair, initial encounter: Secondary | ICD-10-CM | POA: Insufficient documentation

## 2012-03-12 DIAGNOSIS — M79609 Pain in unspecified limb: Secondary | ICD-10-CM | POA: Insufficient documentation

## 2012-03-12 DIAGNOSIS — W19XXXA Unspecified fall, initial encounter: Secondary | ICD-10-CM

## 2012-03-12 DIAGNOSIS — K219 Gastro-esophageal reflux disease without esophagitis: Secondary | ICD-10-CM | POA: Insufficient documentation

## 2012-03-12 DIAGNOSIS — Z79899 Other long term (current) drug therapy: Secondary | ICD-10-CM | POA: Insufficient documentation

## 2012-03-12 DIAGNOSIS — E1149 Type 2 diabetes mellitus with other diabetic neurological complication: Secondary | ICD-10-CM | POA: Insufficient documentation

## 2012-03-12 DIAGNOSIS — M542 Cervicalgia: Secondary | ICD-10-CM | POA: Insufficient documentation

## 2012-03-12 NOTE — ED Notes (Signed)
ZOX:[id number]<AV> Expected date:03/12/12<BR> Expected time: 5:35 PM<BR> Means of arrival:Ambulance<BR> Comments:<BR> Fall from W/C

## 2012-03-12 NOTE — ED Notes (Signed)
Pt removed from backboard

## 2012-03-12 NOTE — ED Notes (Signed)
Pt has pain patch located in center of lower back, name unknown.

## 2012-03-12 NOTE — ED Notes (Signed)
Per EMS-pt fell out of wheelchair while trying to reach for something. Pt c/o of right hip, knee, and leg pain. Pt has hx of chronic pain, currently taking multiple pain medications. Pt on spinal board.

## 2012-03-12 NOTE — ED Notes (Signed)
Patient transported to X-ray 

## 2012-03-12 NOTE — ED Provider Notes (Signed)
History     CSN: [id number]  Arrival date & time 03/12/12  1756   First MD Initiated Contact with Patient 03/12/12 1846      Chief Complaint  Patient presents with  . Fall    (Consider location/radiation/quality/duration/timing/severity/associated sxs/prior treatment) Patient is a 78 y.o. female presenting with fall. The history is provided by the patient.  Fall   Patient's fell out of a wheel chair while trying to reach for an object today. No loss of consciousness. Denies any head or neck pain. Complains of pain to her right shoulder lumbar sacral spine and right upper thigh. History of chronic pain and is on multiple medications for this. History of chronic back pain as well 2. She also notes increased lower lumbar sacral spine. No neurological changes of the extremities lower. Patient called EMS was placed on a backboard and transported here Past Medical History  Diagnosis Date  . Diabetes mellitus     greater than 30 years  . Hypertension   . Incontinence of urine   . Diarrhea   . Neuropathy     bilateral feet  . Arthritis     lower back/hands  . GERD (gastroesophageal reflux disease)     Past Surgical History  Procedure Date  . Eye surgery     bil.cataract-unknown year  . Incontinence surgery 2005  . Tonsillectomy 1964  . Abdominal hysterectomy     1982  . Ovarian cyst surgery 1972  . Breast biopsy 1990  . Cholecystectomy 1988  . Cystoscopy 2009    macroplastique inj  . Rectocele repair 10/15/2011    Procedure: POSTERIOR REPAIR (RECTOCELE);  Surgeon: Kathi Ludwig, MD;  Location: [facility];  Service: Urology;;  posterior vaginal vault repair with sacral spinus repair with graft  . Cystoscopy 10/15/2011    Procedure: CYSTOSCOPY;  Surgeon: Kathi Ludwig, MD;  Location: [facility];  Service: Urology;  Laterality: N/A;  . Flexible sigmoidoscopy 02/28/2012    Procedure: FLEXIBLE SIGMOIDOSCOPY;  Surgeon: Willis Modena, MD;   Location: [facility];  Service: Endoscopy;  Laterality: N/A;    Family History  Problem Relation Age of Onset  . Osteoarthritis Sister   . Osteoarthritis Brother     History  Substance Use Topics  . Smoking status: Never Smoker   . Smokeless tobacco: Never Used  . Alcohol Use: No    OB History    Grav Para Term Preterm Abortions TAB SAB Ect Mult Living                  Review of Systems  All other systems reviewed and are negative.    Allergies  Neosporin and Sulfa antibiotics  Home Medications   Current Outpatient Rx  Name Route Sig Dispense Refill  . ACETAMINOPHEN ER 650 MG PO TBCR Oral Take 650 mg by mouth every 6 (six) hours as needed. For pain.    . ASPIRIN EC 81 MG PO TBEC Oral Take 81 mg by mouth daily.    Marland Kitchen VITAMIN D 1000 UNITS PO TABS Oral Take 1,000 Units by mouth daily.    . DULOXETINE HCL 30 MG PO CPEP Oral Take 1 capsule (30 mg total) by mouth daily. 30 capsule 0  . INSULIN ASPART 100 UNIT/ML [place] SOLN Subcutaneous Inject 3 Units into the skin 3 (three) times daily with meals. 1 vial 0  . INSULIN GLARGINE 100 UNIT/ML [place] SOLN Subcutaneous Inject 30 Units into the skin daily. 10 mL 0  .  LANSOPRAZOLE 30 MG PO CPDR Oral Take 30 mg by mouth daily.    Marland Kitchen LIDOCAINE 5 % EX PTCH Transdermal Place 1 patch onto the skin daily. Remove & Discard patch within 12 hours or as directed by MD    . LORAZEPAM 0.5 MG PO TABS Oral Take 0.5 mg by mouth every 8 (eight) hours. For anxiety.    . MORPHINE SULFATE ER 30 MG PO TBCR Oral Take 1 tablet (30 mg total) by mouth every 12 (twelve) hours. 60 tablet 0  . MORPHINE SULFATE (CONCENTRATE) 20 MG/ML PO SOLN Oral Take 5 mg by mouth every 4 (four) hours as needed. For pain.    Marland Kitchen ONDANSETRON HCL 4 MG PO TABS Oral Take 4 mg by mouth every 6 (six) hours as needed. For nausea.    Marland Kitchen POLYETHYLENE GLYCOL 3350 PO PACK Oral Take 17 g by mouth daily. 14 each 11  . POTASSIUM CHLORIDE CRYS ER 20 MEQ PO TBCR Oral Take 1 tablet (20 mEq total) by  mouth 3 (three) times daily. 6 tablet 0    Take for 2 days then discontinue  . SENNOSIDES-DOCUSATE SODIUM 8.6-50 MG PO TABS Oral Take 2 tablets by mouth at bedtime. 60 tablet 0    BP 144/79  Pulse 84  Temp 98.4 F (36.9 C) (Oral)  Resp 26  SpO2 100%  Physical Exam  Nursing note and vitals reviewed. Constitutional: She is oriented to person, place, and time. She appears well-developed and well-nourished.  Non-toxic appearance. No distress.  HENT:  Head: Normocephalic and atraumatic.  Eyes: Conjunctivae, EOM and lids are normal. Pupils are equal, round, and reactive to light.  Neck: Normal range of motion. Neck supple. No tracheal deviation present. No mass present.  Cardiovascular: Normal rate, regular rhythm and normal heart sounds.  Exam reveals no gallop.   No murmur heard. Pulmonary/Chest: Effort normal and breath sounds normal. No stridor. No respiratory distress. She has no decreased breath sounds. She has no wheezes. She has no rhonchi. She has no rales.  Abdominal: Soft. Normal appearance and bowel sounds are normal. She exhibits no distension. There is no tenderness. There is no rebound and no CVA tenderness.  Musculoskeletal: Normal range of motion. She exhibits no edema and no tenderness.       Pain to palpation at right posterior deltoid. Right shoulder with full range of motion and no gross deformities. Skin is intact.  Right distal femur with tenderness to palpation without ecchymosis. Skin intact. Right knee is normal.  Lumbar sacral spine with midline and paraspinal muscle tenderness.  Neurological: She is alert and oriented to person, place, and time. She has normal strength. No cranial nerve deficit or sensory deficit. GCS eye subscore is 4. GCS verbal subscore is 5. GCS motor subscore is 6.  Skin: Skin is warm and dry. No abrasion and no rash noted.  Psychiatric: She has a normal mood and affect. Her speech is normal and behavior is normal.    ED Course    Procedures (including critical care time)  Labs Reviewed - No data to display No results found.   No diagnosis found.    MDM  All xrays neg, will d/c        Toy Baker, MD 03/12/12 2120

## 2012-03-12 NOTE — ED Notes (Signed)
Ptar notified, spoke operatory 1800, [place].

## 2012-03-14 ENCOUNTER — Ambulatory Visit (INDEPENDENT_AMBULATORY_CARE_PROVIDER_SITE_OTHER): Payer: Medicare Other | Admitting: Ophthalmology

## 2012-04-15 ENCOUNTER — Other Ambulatory Visit: Payer: Self-pay | Admitting: Neurosurgery

## 2012-04-26 ENCOUNTER — Encounter (HOSPITAL_COMMUNITY): Payer: Self-pay | Admitting: Pharmacy Technician

## 2012-05-03 ENCOUNTER — Encounter (HOSPITAL_COMMUNITY): Payer: Self-pay

## 2012-05-03 ENCOUNTER — Encounter (HOSPITAL_COMMUNITY)
Admission: RE | Admit: 2012-05-03 | Discharge: 2012-05-03 | Disposition: A | Discharge status: Home / Self Care | Payer: Medicare Other | Source: Ambulatory Visit | Attending: Neurosurgery | Admitting: Neurosurgery

## 2012-05-03 HISTORY — DX: Depression, unspecified: F32.A

## 2012-05-03 HISTORY — DX: Personal history of other infectious and parasitic diseases: Z86.19

## 2012-05-03 HISTORY — DX: Major depressive disorder, single episode, unspecified: F32.9

## 2012-05-03 HISTORY — DX: Spinal stenosis, lumbar region without neurogenic claudication: M48.061

## 2012-05-03 HISTORY — DX: Stress incontinence (female) (male): N39.3

## 2012-05-03 LAB — BASIC METABOLIC PANEL
BUN: 13 mg/dL (ref 6–23)
CO2: 29 mEq/L (ref 19–32)
Calcium: 9.8 mg/dL (ref 8.4–10.5)
Chloride: 97 mEq/L (ref 96–112)
Creatinine, Ser: 0.65 mg/dL (ref 0.50–1.10)
GFR calc Af Amer: >90 mL/min (ref >90)
GFR calc non Af Amer: 84 mL/min — ABNORMAL LOW (ref >90)
Glucose, Bld: 272 mg/dL — ABNORMAL HIGH (ref 70–99)
Potassium: 4.8 mEq/L (ref 3.5–5.1)
Sodium: 135 mEq/L (ref 135–145)

## 2012-05-03 LAB — CBC
HCT: 42.1 % (ref 36.0–46.0)
Hemoglobin: 13.5 g/dL (ref 12.0–15.0)
MCH: 25.5 pg — ABNORMAL LOW (ref 26.0–34.0)
MCHC: 32.1 g/dL (ref 30.0–36.0)
MCV: 79.4 fL (ref 78.0–100.0)
Platelets: 321 10*3/uL (ref 150–400)
RBC: 5.3 MIL/uL — ABNORMAL HIGH (ref 3.87–5.11)
RDW: 15.1 % (ref 11.5–15.5)
WBC: 9.6 10*3/uL (ref 4.0–10.5)

## 2012-05-03 LAB — ABO/RH: ABO/RH(D): O POS

## 2012-05-03 LAB — PREPARE RBC (CROSSMATCH)

## 2012-05-03 LAB — SURGICAL PCR SCREEN
MRSA, PCR: NEGATIVE
Staphylococcus aureus: NEGATIVE

## 2012-05-03 NOTE — Pre-Procedure Instructions (Signed)
20 [name]  05/03/2012   Your procedure is scheduled on:  Monday September 30  Report to [facility] at 0530 AM.  Call this number if you have problems the morning of surgery: [phone]   Remember:   Do not eat food or drink liquids:After Midnight.              Take only 15 units of Lantus insulin at bedtime with a snack      Take these medicines the morning of surgery with A SIP OF WATER: Cymbalta, Prevacid,MS Contin    Do not wear jewelry, make-up or nail polish.  Do not wear lotions, powders, or perfumes. You may wear deodorant.  Do not shave 48 hours prior to surgery. Men may shave face and neck.  Do not bring valuables to the hospital.  Contacts, dentures or bridgework may not be worn into surgery.  Leave suitcase in the car. After surgery it may be brought to your room.  For patients admitted to the hospital, checkout time is 11:00 AM the day of discharge.   Patients discharged the day of surgery will not be allowed to drive home.  Name and phone number of your driver: n/a  Special Instructions: Shower using CHG 2 nights before surgery and the night before surgery.  If you shower the day of surgery use CHG.  Use special wash - you have one bottle of CHG for all showers.  You should use approximately 1/3 of the bottle for each shower.   Please read over the following fact sheets that you were given: Pain Booklet, Coughing and Deep Breathing, Blood Transfusion Information, MRSA Information and Surgical Site Infection Prevention

## 2012-05-08 MED ORDER — CEFAZOLIN SODIUM-DEXTROSE 2-3 GM-% IV SOLR
2.0000 g | INTRAVENOUS | Status: AC
  Administered 2012-05-09 (×2): 2 g via INTRAVENOUS
  Filled 2012-05-08: qty 50

## 2012-05-09 ENCOUNTER — Encounter (HOSPITAL_COMMUNITY): Payer: Self-pay | Admitting: Certified Registered"

## 2012-05-09 ENCOUNTER — Encounter (HOSPITAL_COMMUNITY)
Admission: RE | Disposition: A | Discharge status: Skilled Nursing Facility | Payer: Self-pay | Source: Ambulatory Visit | Attending: Neurosurgery

## 2012-05-09 ENCOUNTER — Inpatient Hospital Stay (HOSPITAL_COMMUNITY): Payer: Medicare Other

## 2012-05-09 ENCOUNTER — Inpatient Hospital Stay (HOSPITAL_COMMUNITY)
Admission: RE | Admit: 2012-05-09 | Discharge: 2012-05-14 | DRG: 458 | Disposition: A | Discharge status: Skilled Nursing Facility | Payer: Medicare Other | Source: Ambulatory Visit | Attending: Neurosurgery | Admitting: Neurosurgery

## 2012-05-09 ENCOUNTER — Encounter (HOSPITAL_COMMUNITY): Payer: Self-pay | Admitting: *Deleted

## 2012-05-09 ENCOUNTER — Inpatient Hospital Stay (HOSPITAL_COMMUNITY): Payer: Medicare Other | Admitting: Certified Registered"

## 2012-05-09 DIAGNOSIS — F411 Generalized anxiety disorder: Secondary | ICD-10-CM | POA: Diagnosis present

## 2012-05-09 DIAGNOSIS — E119 Type 2 diabetes mellitus without complications: Secondary | ICD-10-CM | POA: Diagnosis present

## 2012-05-09 DIAGNOSIS — Z794 Long term (current) use of insulin: Secondary | ICD-10-CM

## 2012-05-09 DIAGNOSIS — F3289 Other specified depressive episodes: Secondary | ICD-10-CM | POA: Diagnosis present

## 2012-05-09 DIAGNOSIS — M4120 Other idiopathic scoliosis, site unspecified: Principal | ICD-10-CM | POA: Diagnosis present

## 2012-05-09 DIAGNOSIS — Z01812 Encounter for preprocedural laboratory examination: Secondary | ICD-10-CM

## 2012-05-09 DIAGNOSIS — F329 Major depressive disorder, single episode, unspecified: Secondary | ICD-10-CM | POA: Diagnosis present

## 2012-05-09 DIAGNOSIS — R5381 Other malaise: Secondary | ICD-10-CM | POA: Diagnosis present

## 2012-05-09 DIAGNOSIS — M47817 Spondylosis without myelopathy or radiculopathy, lumbosacral region: Secondary | ICD-10-CM | POA: Diagnosis present

## 2012-05-09 DIAGNOSIS — G609 Hereditary and idiopathic neuropathy, unspecified: Secondary | ICD-10-CM | POA: Diagnosis present

## 2012-05-09 DIAGNOSIS — K5900 Constipation, unspecified: Secondary | ICD-10-CM | POA: Diagnosis present

## 2012-05-09 DIAGNOSIS — Q762 Congenital spondylolisthesis: Secondary | ICD-10-CM

## 2012-05-09 DIAGNOSIS — Z79899 Other long term (current) drug therapy: Secondary | ICD-10-CM

## 2012-05-09 DIAGNOSIS — K219 Gastro-esophageal reflux disease without esophagitis: Secondary | ICD-10-CM | POA: Diagnosis present

## 2012-05-09 DIAGNOSIS — Z7982 Long term (current) use of aspirin: Secondary | ICD-10-CM

## 2012-05-09 DIAGNOSIS — I10 Essential (primary) hypertension: Secondary | ICD-10-CM | POA: Diagnosis present

## 2012-05-09 HISTORY — PX: POSTERIOR FUSION LUMBAR SPINE: SUR632

## 2012-05-09 HISTORY — DX: Type 2 diabetes mellitus without complications: E11.9

## 2012-05-09 LAB — GLUCOSE, CAPILLARY
Glucose-Capillary: 163 mg/dL — ABNORMAL HIGH (ref 70–99)
Glucose-Capillary: 103 mg/dL — ABNORMAL HIGH (ref 70–99)
Glucose-Capillary: 110 mg/dL — ABNORMAL HIGH (ref 70–99)
Glucose-Capillary: 142 mg/dL — ABNORMAL HIGH (ref 70–99)
Glucose-Capillary: 145 mg/dL — ABNORMAL HIGH (ref 70–99)
Glucose-Capillary: 185 mg/dL — ABNORMAL HIGH (ref 70–99)
Glucose-Capillary: 95 mg/dL (ref 70–99)

## 2012-05-09 SURGERY — POSTERIOR LUMBAR FUSION 2 LEVEL
Anesthesia: General | Site: Back | Wound class: Clean

## 2012-05-09 MED ORDER — BACITRACIN 50000 UNITS IM SOLR
INTRAMUSCULAR | Status: AC
  Filled 2012-05-09: qty 1

## 2012-05-09 MED ORDER — DULOXETINE HCL 30 MG PO CPEP
30.0000 mg | ORAL_CAPSULE | Freq: Every day | ORAL | Status: DC
  Administered 2012-05-09 – 2012-05-14 (×6): 30 mg via ORAL
  Filled 2012-05-09 (×6): qty 1

## 2012-05-09 MED ORDER — KETOROLAC TROMETHAMINE 30 MG/ML IJ SOLN
INTRAMUSCULAR | Status: AC
  Filled 2012-05-09: qty 1

## 2012-05-09 MED ORDER — SODIUM CHLORIDE 0.9 % IV SOLN
INTRAVENOUS | Status: AC
  Filled 2012-05-09: qty 500

## 2012-05-09 MED ORDER — SODIUM CHLORIDE 0.9 % IV SOLN: 250.0000 mL | INTRAVENOUS | Status: DC

## 2012-05-09 MED ORDER — MIDAZOLAM HCL 5 MG/5ML IJ SOLN
INTRAMUSCULAR | Status: DC | PRN
  Administered 2012-05-09: 1 mg via INTRAVENOUS

## 2012-05-09 MED ORDER — SODIUM CHLORIDE 0.9 % IV SOLN
INTRAVENOUS | Status: DC
  Administered 2012-05-09 (×2): via INTRAVENOUS
  Administered 2012-05-10: 40 mL/h via INTRAVENOUS

## 2012-05-09 MED ORDER — ACETAMINOPHEN 325 MG PO TABS: 650.0000 mg | ORAL_TABLET | ORAL | Status: DC | PRN

## 2012-05-09 MED ORDER — BISACODYL 10 MG RE SUPP: 10.0000 mg | Freq: Every day | RECTAL | Status: DC | PRN

## 2012-05-09 MED ORDER — ZOLPIDEM TARTRATE 5 MG PO TABS: 5.0000 mg | ORAL_TABLET | Freq: Every evening | ORAL | Status: DC | PRN

## 2012-05-09 MED ORDER — LACTATED RINGERS IV SOLN
INTRAVENOUS | Status: DC | PRN
  Administered 2012-05-09 (×3): via INTRAVENOUS

## 2012-05-09 MED ORDER — GLYCOPYRROLATE 0.2 MG/ML IJ SOLN
INTRAMUSCULAR | Status: DC | PRN
  Administered 2012-05-09: .8 mg via INTRAVENOUS

## 2012-05-09 MED ORDER — PROMETHAZINE HCL 25 MG/ML IJ SOLN
INTRAMUSCULAR | Status: AC
  Filled 2012-05-09: qty 1

## 2012-05-09 MED ORDER — KETOROLAC TROMETHAMINE 30 MG/ML IJ SOLN
15.0000 mg | Freq: Once | INTRAMUSCULAR | Status: AC
  Administered 2012-05-09: 15 mg via INTRAVENOUS

## 2012-05-09 MED ORDER — DEXTROSE 50 % IV SOLN
INTRAVENOUS | Status: AC
  Filled 2012-05-09: qty 50

## 2012-05-09 MED ORDER — MIDAZOLAM HCL 2 MG/2ML IJ SOLN: 0.5000 mg | Freq: Once | INTRAMUSCULAR | Status: DC | PRN

## 2012-05-09 MED ORDER — SENNOSIDES-DOCUSATE SODIUM 8.6-50 MG PO TABS
2.0000 | ORAL_TABLET | Freq: Every day | ORAL | Status: DC
  Administered 2012-05-09 – 2012-05-12 (×4): 2 via ORAL
  Filled 2012-05-09 (×5): qty 2
  Filled 2012-05-09: qty 1

## 2012-05-09 MED ORDER — PANTOPRAZOLE SODIUM 40 MG PO TBEC
40.0000 mg | DELAYED_RELEASE_TABLET | Freq: Every day | ORAL | Status: DC
  Administered 2012-05-09 – 2012-05-14 (×6): 40 mg via ORAL
  Filled 2012-05-09 (×6): qty 1

## 2012-05-09 MED ORDER — NEOSTIGMINE METHYLSULFATE 1 MG/ML IJ SOLN
INTRAMUSCULAR | Status: DC | PRN
  Administered 2012-05-09: 4 mg via INTRAVENOUS

## 2012-05-09 MED ORDER — SODIUM CHLORIDE 0.9 % IJ SOLN: 3.0000 mL | INTRAMUSCULAR | Status: DC | PRN

## 2012-05-09 MED ORDER — BUPIVACAINE HCL (PF) 0.5 % IJ SOLN
INTRAMUSCULAR | Status: DC | PRN
  Administered 2012-05-09: 20 mL

## 2012-05-09 MED ORDER — ACETAMINOPHEN 10 MG/ML IV SOLN
INTRAVENOUS | Status: AC
  Administered 2012-05-09: 1000 mg via INTRAVENOUS
  Filled 2012-05-09: qty 100

## 2012-05-09 MED ORDER — PHENOL 1.4 % MT LIQD: 1.0000 | OROMUCOSAL | Status: DC | PRN

## 2012-05-09 MED ORDER — LORAZEPAM 0.5 MG PO TABS
0.5000 mg | ORAL_TABLET | Freq: Three times a day (TID) | ORAL | Status: DC
  Administered 2012-05-09 – 2012-05-12 (×9): 0.5 mg via ORAL
  Filled 2012-05-09 (×9): qty 1

## 2012-05-09 MED ORDER — HYDROMORPHONE HCL PF 1 MG/ML IJ SOLN
0.2500 mg | INTRAMUSCULAR | Status: DC | PRN
  Administered 2012-05-09 (×2): 0.5 mg via INTRAVENOUS

## 2012-05-09 MED ORDER — POLYETHYLENE GLYCOL 3350 17 G PO PACK
17.0000 g | PACK | Freq: Every evening | ORAL | Status: DC
  Administered 2012-05-09 – 2012-05-13 (×5): 17 g via ORAL
  Filled 2012-05-09 (×6): qty 1

## 2012-05-09 MED ORDER — MORPHINE SULFATE 4 MG/ML IJ SOLN
4.0000 mg | INTRAMUSCULAR | Status: DC | PRN
  Administered 2012-05-09: 4 mg via INTRAMUSCULAR
  Filled 2012-05-09: qty 2
  Filled 2012-05-09 (×2): qty 1

## 2012-05-09 MED ORDER — HYDROXYZINE HCL 25 MG PO TABS
50.0000 mg | ORAL_TABLET | ORAL | Status: DC | PRN
  Administered 2012-05-10 – 2012-05-13 (×3): 50 mg via ORAL
  Filled 2012-05-09 (×2): qty 1
  Filled 2012-05-09 (×2): qty 2

## 2012-05-09 MED ORDER — LIDOCAINE HCL (CARDIAC) 20 MG/ML IV SOLN
INTRAVENOUS | Status: DC | PRN
  Administered 2012-05-09: 80 mg via INTRAVENOUS

## 2012-05-09 MED ORDER — 0.9 % SODIUM CHLORIDE (POUR BTL) OPTIME
TOPICAL | Status: DC | PRN
  Administered 2012-05-09: 1000 mL

## 2012-05-09 MED ORDER — PROPOFOL 10 MG/ML IV BOLUS
INTRAVENOUS | Status: DC | PRN
  Administered 2012-05-09: 80 mg via INTRAVENOUS

## 2012-05-09 MED ORDER — ROCURONIUM BROMIDE 100 MG/10ML IV SOLN
INTRAVENOUS | Status: DC | PRN
  Administered 2012-05-09: 20 mg via INTRAVENOUS
  Administered 2012-05-09 (×2): 10 mg via INTRAVENOUS
  Administered 2012-05-09: 50 mg via INTRAVENOUS

## 2012-05-09 MED ORDER — HEMOSTATIC AGENTS (NO CHARGE) OPTIME
TOPICAL | Status: DC | PRN
  Administered 2012-05-09: 1 via TOPICAL

## 2012-05-09 MED ORDER — HYDROMORPHONE HCL PF 1 MG/ML IJ SOLN
INTRAMUSCULAR | Status: AC
  Filled 2012-05-09: qty 1

## 2012-05-09 MED ORDER — CYCLOBENZAPRINE HCL 10 MG PO TABS
10.0000 mg | ORAL_TABLET | Freq: Three times a day (TID) | ORAL | Status: DC | PRN
  Administered 2012-05-09 – 2012-05-14 (×6): 10 mg via ORAL
  Filled 2012-05-09 (×7): qty 1

## 2012-05-09 MED ORDER — SODIUM CHLORIDE 0.9 % IV SOLN
INTRAVENOUS | Status: DC | PRN
  Administered 2012-05-09 (×2): via INTRAVENOUS

## 2012-05-09 MED ORDER — CEFAZOLIN SODIUM-DEXTROSE 2-3 GM-% IV SOLR
INTRAVENOUS | Status: AC
  Filled 2012-05-09: qty 50

## 2012-05-09 MED ORDER — ACETAMINOPHEN 10 MG/ML IV SOLN
1000.0000 mg | Freq: Four times a day (QID) | INTRAVENOUS | Status: AC
  Administered 2012-05-09 – 2012-05-10 (×4): 1000 mg via INTRAVENOUS
  Filled 2012-05-09 (×5): qty 100

## 2012-05-09 MED ORDER — HEPARIN SODIUM (PORCINE) 1000 UNIT/ML IJ SOLN
INTRAMUSCULAR | Status: AC
  Filled 2012-05-09: qty 1

## 2012-05-09 MED ORDER — LIDOCAINE HCL 4 % MT SOLN
OROMUCOSAL | Status: DC | PRN
  Administered 2012-05-09: 4 mL via TOPICAL

## 2012-05-09 MED ORDER — ACETAMINOPHEN 650 MG RE SUPP: 650.0000 mg | RECTAL | Status: DC | PRN

## 2012-05-09 MED ORDER — ONDANSETRON HCL 4 MG/2ML IJ SOLN
INTRAMUSCULAR | Status: DC | PRN
  Administered 2012-05-09: 4 mg via INTRAVENOUS

## 2012-05-09 MED ORDER — MENTHOL 3 MG MT LOZG: 1.0000 | LOZENGE | OROMUCOSAL | Status: DC | PRN

## 2012-05-09 MED ORDER — SODIUM CHLORIDE 0.9 % IR SOLN
Status: DC | PRN
  Administered 2012-05-09: 10:00:00

## 2012-05-09 MED ORDER — POTASSIUM CHLORIDE CRYS ER 20 MEQ PO TBCR: 20.0000 meq | EXTENDED_RELEASE_TABLET | Freq: Three times a day (TID) | ORAL | Status: DC

## 2012-05-09 MED ORDER — OXYCODONE HCL 5 MG PO TABS
5.0000 mg | ORAL_TABLET | ORAL | Status: DC | PRN
  Administered 2012-05-09 – 2012-05-10 (×4): 10 mg via ORAL
  Administered 2012-05-10: 5 mg via ORAL
  Administered 2012-05-11 – 2012-05-14 (×11): 10 mg via ORAL
  Administered 2012-05-14: 5 mg via ORAL
  Administered 2012-05-14: 10 mg via ORAL
  Filled 2012-05-09: qty 2
  Filled 2012-05-09: qty 1
  Filled 2012-05-09 (×2): qty 2
  Filled 2012-05-09: qty 1
  Filled 2012-05-09 (×3): qty 2
  Filled 2012-05-09: qty 1
  Filled 2012-05-09 (×11): qty 2

## 2012-05-09 MED ORDER — SODIUM CHLORIDE 0.9 % IJ SOLN
3.0000 mL | Freq: Two times a day (BID) | INTRAMUSCULAR | Status: DC
  Administered 2012-05-10 – 2012-05-14 (×7): 3 mL via INTRAVENOUS

## 2012-05-09 MED ORDER — INSULIN GLARGINE 100 UNIT/ML ~~LOC~~ SOLN
30.0000 [IU] | Freq: Every day | SUBCUTANEOUS | Status: DC
  Administered 2012-05-09 – 2012-05-13 (×4): 30 [IU] via SUBCUTANEOUS

## 2012-05-09 MED ORDER — PROMETHAZINE HCL 25 MG/ML IJ SOLN
6.2500 mg | INTRAMUSCULAR | Status: DC | PRN
  Administered 2012-05-09: 12.5 mg via INTRAVENOUS

## 2012-05-09 MED ORDER — KETOROLAC TROMETHAMINE 30 MG/ML IJ SOLN
15.0000 mg | Freq: Four times a day (QID) | INTRAMUSCULAR | Status: AC
  Administered 2012-05-09: 30 mg via INTRAVENOUS
  Administered 2012-05-10 – 2012-05-11 (×7): 15 mg via INTRAVENOUS
  Filled 2012-05-09 (×11): qty 1

## 2012-05-09 MED ORDER — VITAMIN D3 25 MCG (1000 UNIT) PO TABS
1000.0000 [IU] | ORAL_TABLET | Freq: Every day | ORAL | Status: DC
  Administered 2012-05-09 – 2012-05-14 (×6): 1000 [IU] via ORAL
  Filled 2012-05-09 (×6): qty 1

## 2012-05-09 MED ORDER — HYDROXYZINE HCL 50 MG/ML IM SOLN
50.0000 mg | INTRAMUSCULAR | Status: DC | PRN
  Administered 2012-05-09: 50 mg via INTRAMUSCULAR
  Filled 2012-05-09: qty 1

## 2012-05-09 MED ORDER — ALUM & MAG HYDROXIDE-SIMETH 200-200-20 MG/5ML PO SUSP
30.0000 mL | Freq: Four times a day (QID) | ORAL | Status: DC | PRN
  Administered 2012-05-11: 30 mL via ORAL
  Filled 2012-05-09: qty 30

## 2012-05-09 MED ORDER — FENTANYL CITRATE 0.05 MG/ML IJ SOLN
INTRAMUSCULAR | Status: DC | PRN
  Administered 2012-05-09 (×5): 50 ug via INTRAVENOUS
  Administered 2012-05-09: 250 ug via INTRAVENOUS

## 2012-05-09 MED ORDER — LIDOCAINE-EPINEPHRINE 1 %-1:100000 IJ SOLN
INTRAMUSCULAR | Status: DC | PRN
  Administered 2012-05-09: 20 mL via INTRADERMAL

## 2012-05-09 MED ORDER — INSULIN NPH (HUMAN) (ISOPHANE) 100 UNIT/ML ~~LOC~~ SUSP: 8.0000 [IU] | Freq: Three times a day (TID) | SUBCUTANEOUS | Status: DC

## 2012-05-09 MED ORDER — MORPHINE SULFATE ER 30 MG PO TBCR
30.0000 mg | EXTENDED_RELEASE_TABLET | Freq: Two times a day (BID) | ORAL | Status: DC
  Administered 2012-05-09 – 2012-05-12 (×6): 30 mg via ORAL
  Filled 2012-05-09 (×6): qty 1

## 2012-05-09 MED ORDER — MEPERIDINE HCL 25 MG/ML IJ SOLN: 6.2500 mg | INTRAMUSCULAR | Status: DC | PRN

## 2012-05-09 MED ORDER — THROMBIN 20000 UNITS EX KIT
PACK | CUTANEOUS | Status: DC | PRN
  Administered 2012-05-09: 20000 [IU] via TOPICAL

## 2012-05-09 MED ORDER — PHENYLEPHRINE HCL 10 MG/ML IJ SOLN
INTRAMUSCULAR | Status: DC | PRN
  Administered 2012-05-09 (×3): 40 ug via INTRAVENOUS

## 2012-05-09 MED ORDER — MAGNESIUM HYDROXIDE 400 MG/5ML PO SUSP: 30.0000 mL | Freq: Every day | ORAL | Status: DC | PRN

## 2012-05-09 MED ORDER — INSULIN ASPART 100 UNIT/ML ~~LOC~~ SOLN
8.0000 [IU] | Freq: Three times a day (TID) | SUBCUTANEOUS | Status: DC
  Administered 2012-05-09 – 2012-05-14 (×7): 8 [IU] via SUBCUTANEOUS

## 2012-05-09 SURGICAL SUPPLY — 93 items
ADH SKN CLS APL DERMABOND .7 (GAUZE/BANDAGES/DRESSINGS) ×2
APL SKNCLS STERI-STRIP NONHPOA (GAUZE/BANDAGES/DRESSINGS)
BAG DECANTER FOR FLEXI CONT (MISCELLANEOUS) ×2 IMPLANT
BENZOIN TINCTURE PRP APPL 2/3 (GAUZE/BANDAGES/DRESSINGS) IMPLANT
BLADE SURG ROTATE 9660 (MISCELLANEOUS) IMPLANT
BRUSH SCRUB EZ PLAIN DRY (MISCELLANEOUS) ×2 IMPLANT
BUR ACORN 6.0 ACORN (BURR) IMPLANT
BUR ACRON 5.0MM COATED (BURR) ×2 IMPLANT
BUR MATCHSTICK NEURO 3.0 LAGG (BURR) ×2 IMPLANT
CANISTER SUCTION 2500CC (MISCELLANEOUS) ×2 IMPLANT
CAP LCK SPNE (Orthopedic Implant) ×6 IMPLANT
CAP LOCK SPINE RADIUS (Orthopedic Implant) IMPLANT
CAP LOCKING (Orthopedic Implant) ×12 IMPLANT
CLOTH BEACON ORANGE TIMEOUT ST (SAFETY) ×2 IMPLANT
CONT SPEC 4OZ CLIKSEAL STRL BL (MISCELLANEOUS) ×3 IMPLANT
COVER BACK TABLE 24X17X13 BIG (DRAPES) ×1 IMPLANT
COVER TABLE BACK 60X90 (DRAPES) ×1 IMPLANT
DERMABOND ADVANCED (GAUZE/BANDAGES/DRESSINGS) ×2
DERMABOND ADVANCED .7 DNX12 (GAUZE/BANDAGES/DRESSINGS) ×2 IMPLANT
DRAPE C-ARM 42X72 X-RAY (DRAPES) ×5 IMPLANT
DRAPE LAPAROTOMY 100X72X124 (DRAPES) ×2 IMPLANT
DRAPE POUCH INSTRU U-SHP 10X18 (DRAPES) ×2 IMPLANT
DRAPE PROXIMA HALF (DRAPES) ×3 IMPLANT
DRAPE SURG 17X23 STRL (DRAPES) ×1 IMPLANT
DRESSING TELFA 8X3 (GAUZE/BANDAGES/DRESSINGS) IMPLANT
DRSG EMULSION OIL 3X3 NADH (GAUZE/BANDAGES/DRESSINGS) IMPLANT
DURAPREP 26ML APPLICATOR (WOUND CARE) ×1 IMPLANT
ELECT REM PT RETURN 9FT ADLT (ELECTROSURGICAL) ×2
ELECTRODE REM PT RTRN 9FT ADLT (ELECTROSURGICAL) ×1 IMPLANT
GAUZE SPONGE 4X4 16PLY XRAY LF (GAUZE/BANDAGES/DRESSINGS) IMPLANT
GLOVE BIOGEL PI IND STRL 8 (GLOVE) ×2 IMPLANT
GLOVE BIOGEL PI IND STRL 8.5 (GLOVE) IMPLANT
GLOVE BIOGEL PI INDICATOR 8 (GLOVE) ×2
GLOVE BIOGEL PI INDICATOR 8.5 (GLOVE) ×1
GLOVE ECLIPSE 7.5 STRL STRAW (GLOVE) ×5 IMPLANT
GLOVE EXAM NITRILE LRG STRL (GLOVE) IMPLANT
GLOVE EXAM NITRILE MD LF STRL (GLOVE) ×2 IMPLANT
GLOVE EXAM NITRILE XL STR (GLOVE) IMPLANT
GLOVE EXAM NITRILE XS STR PU (GLOVE) IMPLANT
GLOVE SURG SS PI 8.0 STRL IVOR (GLOVE) ×3 IMPLANT
GOWN BRE IMP SLV AUR LG STRL (GOWN DISPOSABLE) IMPLANT
GOWN BRE IMP SLV AUR XL STRL (GOWN DISPOSABLE) ×5 IMPLANT
GOWN STRL REIN 2XL LVL4 (GOWN DISPOSABLE) ×1 IMPLANT
KIT BASIN OR (CUSTOM PROCEDURE TRAY) ×2 IMPLANT
KIT INFUSE SMALL (Orthopedic Implant) ×1 IMPLANT
KIT POSITION SURG JACKSON T1 (MISCELLANEOUS) ×2 IMPLANT
KIT ROOM TURNOVER OR (KITS) ×2 IMPLANT
MILL MEDIUM DISP (BLADE) ×2 IMPLANT
NDL 18GX1X1/2 (RX/OR ONLY) (NEEDLE) ×1 IMPLANT
NDL HYPO 25X1 1.5 SAFETY (NEEDLE) ×1 IMPLANT
NDL SPNL 18GX3.5 QUINCKE PK (NEEDLE) IMPLANT
NDL SPNL 22GX3.5 QUINCKE BK (NEEDLE) ×1 IMPLANT
NEEDLE 18GX1X1/2 (RX/OR ONLY) (NEEDLE) IMPLANT
NEEDLE BONE MARROW 8GAX6 (NEEDLE) ×1 IMPLANT
NEEDLE HYPO 25X1 1.5 SAFETY (NEEDLE) ×2 IMPLANT
NEEDLE SPNL 18GX3.5 QUINCKE PK (NEEDLE) ×2 IMPLANT
NEEDLE SPNL 22GX3.5 QUINCKE BK (NEEDLE) ×4 IMPLANT
NS IRRIG 1000ML POUR BTL (IV SOLUTION) ×2 IMPLANT
PACK LAMINECTOMY NEURO (CUSTOM PROCEDURE TRAY) ×2 IMPLANT
PAD ARMBOARD 7.5X6 YLW CONV (MISCELLANEOUS) ×6 IMPLANT
PATTIES SURGICAL .5 X.5 (GAUZE/BANDAGES/DRESSINGS) ×1 IMPLANT
PATTIES SURGICAL .5 X1 (DISPOSABLE) IMPLANT
PATTIES SURGICAL 1X1 (DISPOSABLE) IMPLANT
PEEK PLIF AVS 10X25X4 (Peek) ×1 IMPLANT
PEEK PLIF AVS 12X25X4 (Peek) ×1 IMPLANT
PEEK PLIF AVS 13X25X4 (Peek) ×1 IMPLANT
PEEK PLIF AVS 8X25X4 DEGREE (Peek) ×1 IMPLANT
ROD 5.5X60MM PURPLE (Rod) ×1 IMPLANT
ROD MAX 50MM (Rod) ×1 IMPLANT
SCREW 5.75X40M (Screw) ×2 IMPLANT
SCREW 5.75X45MM (Screw) ×4 IMPLANT
SPONGE GAUZE 4X4 12PLY (GAUZE/BANDAGES/DRESSINGS) ×2 IMPLANT
SPONGE LAP 4X18 X RAY DECT (DISPOSABLE) IMPLANT
SPONGE NEURO XRAY DETECT 1X3 (DISPOSABLE) ×1 IMPLANT
SPONGE SURGIFOAM ABS GEL 100 (HEMOSTASIS) ×1 IMPLANT
STAPLER SKIN PROX WIDE 3.9 (STAPLE) IMPLANT
STRIP BIOACTIVE VITOSS 25X100X (Neuro Prosthesis/Implant) ×2 IMPLANT
STRIP CLOSURE SKIN 1/2X4 (GAUZE/BANDAGES/DRESSINGS) ×1 IMPLANT
SUT PROLENE 6 0 BV (SUTURE) IMPLANT
SUT VIC AB 1 CT1 18XBRD ANBCTR (SUTURE) ×2 IMPLANT
SUT VIC AB 1 CT1 8-18 (SUTURE) ×4
SUT VIC AB 2-0 CP2 18 (SUTURE) ×5 IMPLANT
SYR 20CC LL (SYRINGE) ×2 IMPLANT
SYR 3ML LL SCALE MARK (SYRINGE) ×8 IMPLANT
SYR 5ML LL (SYRINGE) IMPLANT
SYR CONTROL 10ML LL (SYRINGE) ×2 IMPLANT
SYR INSULIN 1ML 31GX6 SAFETY (SYRINGE) IMPLANT
TAPE CLOTH SURG 4X10 WHT LF (GAUZE/BANDAGES/DRESSINGS) ×1 IMPLANT
TOWEL OR 17X24 6PK STRL BLUE (TOWEL DISPOSABLE) ×2 IMPLANT
TOWEL OR 17X26 10 PK STRL BLUE (TOWEL DISPOSABLE) ×2 IMPLANT
TRAP SPECIMEN MUCOUS 40CC (MISCELLANEOUS) ×1 IMPLANT
TRAY FOLEY CATH 14FRSI W/METER (CATHETERS) ×2 IMPLANT
WATER STERILE IRR 1000ML POUR (IV SOLUTION) ×2 IMPLANT

## 2012-05-09 NOTE — Op Note (Signed)
05/09/2012  12:52 PM  PATIENT:  [name]  78 y.o. female  PRE-OPERATIVE DIAGNOSIS:  lumbar stenosis with claudication, spondylolisthesis, degenerative lumbar scoliosis, lumbar spondylosis  POST-OPERATIVE DIAGNOSIS:  lumbar stenosis with claudication, spondylolisthesis, degenerative lumbar scoliosis, lumbar spondylosis  PROCEDURE:  Procedure(s): POSTERIOR LUMBAR FUSION 2 LEVEL:  L3-L5 decompressive lumbar laminectomy, including bilateral facetectomy and foraminotomies, with decompression of the exiting L3, L4, and L5 nerve roots bilaterally, with microdissection microsurgical technique, with decompression beyond that required for interbody arthrodesis, bilateral L3-4 and L4-5 posterior lumbar interbody arthrodesis with AVS peek interbody implants, Vitoss BA, and infuse, and a bilateral L3-L5 posterior lateral arthrodesis with radius posterior sedation, Vitoss BA, infuse, and locally harvested morcellized autograft  SURGEON:  Surgeon(s): Hewitt Shorts, MD Temple Pacini, MD  ASSISTANTS: Julio Sicks, M.D.  ANESTHESIA:   general  EBL:  Total I/O In: 3120 [I.V.:3000; Blood:120] Out: 1265 [Urine:865; Blood:400]  BLOOD ADMINISTERED:125 CC CELLSAVER  COUNT: Correct per nursing staff  DICTATION: Patient was brought to the operating room placed under general endotracheal anesthesia. The patient was turned to prone position, the lumbar region was prepped with Betadine soap and solution and draped in a sterile fashion. The midline was infiltrated with local anesthesia with epinephrine. A localizing x-ray was taken and then a midline incision was made and carried down through the subcutaneous tissue, bipolar cautery and electrocautery were used to maintain hemostasis. Dissection was carried down to the lumbar fascia. The fascia was incised bilaterally and the paraspinal muscles were dissected with a spinous process and lamina in a subperiosteal fashion. Another x-ray was taken for localization  and the L3, L4, and L5 levels was localized. Dissection was then carried out laterally over the facet complexes and the transverse processes of L3, L4, and L5 were exposed and decorticated. Decompression via inferior L3, complete L4, and superior L5 laminectomy  was performed using double-action rongeurs, the high-speed drill, and Kerrison punches. We were able to decompress severe central canal stenosis. Dissection was carried out laterally including facetectomy and foraminotomies with decompression of the stenotic compression of the L3, L4, and L5 nerve roots. Once the decompression of the stenotic compression of the thecal sac and exiting nerve roots was completed we proceeded with the posterior lumbar interbody arthrodesis. The annulus at each level was incised bilaterally and the disc space entered. A thorough discectomy was performed using pituitary rongeurs and curettes. Once the discectomy was completed we began to prepare the endplate surfaces, removing the cartilaginous endplates surface with paddle curettes. We then measured the height of the intervertebral disc space. Because of the patient's scoliosis and asymmetric loss of disc space height at each level different-sized peek implants were required. We selected 10 x 25 x 4 by 8 AVS peek interbody implants for the left L3-4 level, 8 x 25 x 40 by 8 AVS peek interbody implants with the right L3-4 level, 12 x 25 x 4 by 11 AVS peek interbody implants for the left L4-5 level, and the 13 x 25 x 4 bilaterally AVS peek interbody implant for the right L4-5 level.  The C-arm fluoroscope was then draped and brought in the field and we identified the pedicle entry points bilaterally at the L3, L4, and L5 levels. Each of the 6 pedicles was probed, we aspirated bone marrow aspirate from the vertebral bodies, this was injected over a 2 10 cc strips of Vitoss BA. Then each of the pedicles was examined with the ball probe, good bony surfaces were found and  no bony  cuts were found. Each of the pedicles was then tapped with a 5.25 mm tap, again examined with the ball probe good threading was found and no bony cuts were found. We then placed 5.75 by 45 millimeter screws bilaterally at the L3 level, 5.75 by 45 millimeter screws bilaterally at the L4 level, and 5.75 by 40 millimeter screws bilaterally at the L5 level.  We then packed the AVS peek interbody implants with Vitoss BA with bone marrow aspirate and infuse, and then placed the first implant at the L4-5 level on the left side, carefully retracting the thecal sac and nerve root medially. We then went back to the right side and packed the midline with additional Vitoss BA with bone marrow aspirate and infuse, and then placed a second implant on the right side again retracting the thecal sac and nerve root medially. Additional Vitoss BA with bone marrow aspirate was packed lateral to the implants.  Then at the L3-4 level, we placed the first implant on the right side, carefully retracting the thecal sac and nerve root medially. We then went back to the left side and placed a second implant on the left side, again retracting the thecal sac and nerve root medially. Additional Vitoss BA with bone marrow aspirate and infuse was packed lateral to the implants.   We then packed the lateral gutter over the transverse processes and intertransverse space with locally harvested morcellized autograft, Vitoss BA with bone marrow aspirate, and infuse. We then selected a hyperlordosed rods, using a 50 mm rod on the right and a 60 mm rod on the left.  They were placed within the screw heads and secured with locking caps once all 6 locking caps were placed final tightening was performed against a counter torque.  The wound had been irrigated multiple times during the procedure with saline solution and bacitracin solution, good hemostasis was established with a combination of bipolar cautery and Gelfoam with thrombin. Once good  hemostasis was confirmed we proceeded with closure paraspinal muscles, deep fascia, and Scarpa's fascia were closed with interrupted undyed 1 Vicryl sutures the subcutaneous and subcuticular closed with interrupted inverted 2-0 undyed Vicryl sutures the skin edges were approximated with Dermabond.  Following surgery the patient was turned back to the supine position to be reversed and the anesthetic extubated and transferred to the recovery room for further care.  PLAN OF CARE: Admit to inpatient   PATIENT DISPOSITION:  PACU - hemodynamically stable.   Delay start of Pharmacological VTE agent (>24hrs) due to surgical blood loss or risk of bleeding:  yes

## 2012-05-09 NOTE — H&P (Signed)
Subjective:  Patient is a 78 y.o. female who is admitted for treatment of advanced multilevel degenerative changes in the lumbar spine.patient has a dynamic degenerative grade 1 spondylolisthesis of L4 and 5, degenerative scoliosis lumbar spine and stenosis at L4-5 worse than at L3-4.she is admitted for an L3-L5 decompressive lumbar laminectomy, L3-4 and L4-5 motion lumbar interbody arthrodesis and posterior lateral arthrodesis with interbody implants, postreduction patient, and bone graft. She's been treated with extensive nonsurgical management  without relief.     Patient Active Problem List   Diagnosis Date Noted  . Lower GI bleeding 02/26/2012  . Delirium 02/02/2012  . UTI (lower urinary tract infection) 01/27/2012  . HTN (hypertension) 01/27/2012  . Nausea & vomiting 12/12/2011  . Constipation 12/12/2011  . Diabetes mellitus 12/12/2011  . Low back pain radiating to left leg 12/12/2011   Past Medical History  Diagnosis Date  . Incontinence of urine   . Diarrhea   . Neuropathy     bilateral feet  . Arthritis     lower back/hands  . GERD (gastroesophageal reflux disease)   . Depression   . Diabetes mellitus     Type 2 IDDM  X 40 years;   . Urinary, incontinence, stress female   . History of shingles 2009  . Degenerative lumbar spinal stenosis     Past Surgical History  Procedure Date  . Eye surgery     bil.cataract-unknown year  . Incontinence surgery 2005  . Tonsillectomy 1964  . Abdominal hysterectomy     1982  . Ovarian cyst surgery 1972  . Breast biopsy 1990  . Cholecystectomy 1988  . Cystoscopy 2009    macroplastique inj  . Rectocele repair 10/15/2011    Procedure: POSTERIOR REPAIR (RECTOCELE);  Surgeon: Kathi Ludwig, MD;  Location: [facility];  Service: Urology;;  posterior vaginal vault repair with sacral spinus repair with graft  . Cystoscopy 10/15/2011    Procedure: CYSTOSCOPY;  Surgeon: Kathi Ludwig, MD;  Location: [facility];  Service: Urology;  Laterality: N/A;  . Flexible sigmoidoscopy 02/28/2012    Procedure: FLEXIBLE SIGMOIDOSCOPY;  Surgeon: Willis Modena, MD;  Location: [facility];  Service: Endoscopy;  Laterality: N/A;    Prescriptions prior to admission  Medication Sig Dispense Refill  . acetaminophen (TYLENOL) 325 MG tablet Take 650 mg by mouth every 6 (six) hours as needed. For pain      . aspirin EC 81 MG tablet Take 81 mg by mouth daily.      . cholecalciferol (VITAMIN D) 1000 UNITS tablet Take 1,000 Units by mouth daily.      . DULoxetine (CYMBALTA) 30 MG capsule Take 1 capsule (30 mg total) by mouth daily.  30 capsule  0  . insulin glargine (LANTUS) 100 UNIT/ML injection Inject 30 Units into the skin at bedtime.      . insulin NPH (HUMULIN N,NOVOLIN N) 100 UNIT/ML injection Inject 8 Units into the skin 3 (three) times daily with meals.      . lansoprazole (PREVACID) 30 MG capsule Take 30 mg by mouth daily.      Marland Kitchen morphine (MS CONTIN) 15 MG 12 hr tablet Take 15 mg by mouth daily at 12 noon.      Marland Kitchen morphine (MS CONTIN) 30 MG 12 hr tablet Take 30 mg by mouth 2 (two) times daily.      Marland Kitchen morphine (ROXANOL) 20 MG/ML concentrated solution Take 5 mg by mouth every 4 (four) hours as needed. For  pain.      . ondansetron (ZOFRAN) 4 MG tablet Take 4 mg by mouth every 6 (six) hours as needed. For nausea.      . polyethylene glycol (MIRALAX / GLYCOLAX) packet Take 17 g by mouth every evening.       . potassium chloride SA (K-DUR,KLOR-CON) 20 MEQ tablet Take 20 mEq by mouth 3 (three) times daily.      Marland Kitchen lidocaine (LIDODERM) 5 % Place 1 patch onto the skin daily. Remove & Discard patch within 12 hours or as directed by MD      . LORazepam (ATIVAN) 0.5 MG tablet Take 0.5 mg by mouth every 8 (eight) hours. For anxiety.      . senna-docusate (SENOKOT-S) 8.6-50 MG per tablet Take 2 tablets by mouth at bedtime.  60 tablet  0   Allergies  Allergen Reactions  . Ciprofloxacin Rash  . Neosporin  (Neomycin-Bacitracin Zn-Polymyx) Rash  . Sulfa Antibiotics Rash    History  Substance Use Topics  . Smoking status: Never Smoker   . Smokeless tobacco: Never Used  . Alcohol Use: No    Family History  Problem Relation Age of Onset  . Osteoarthritis Sister   . Osteoarthritis Brother      Review of Systems A comprehensive review of systems was negative.  Objective: Vital signs in last 24 hours: Temp:  [97.7 F (36.5 C)] 97.7 F (36.5 C) (09/30 0616) Pulse Rate:  [97] 97  (09/30 0616) Resp:  [16] 16  (09/30 0616) BP: (200)/(90) 200/90 mmHg (09/30 0616) SpO2:  [100 %] 100 % (09/30 0616)  EXAM: Patient is a elderly white female in no acute distress.  Lungs are clear to auscultation , the patient has symmetrical respiratory excursion. Heart has a regular rate and rhythm normal S1 and S2 no murmur.   Abdomen is soft nontender nondistended bowel sounds are present. Extremity examination shows no clubbing cyanosis or edema. Musculoskeletal examination shows minimal discomfort to palpation diffusely in the lumbar region without specific point tenderness. She has limited mobility in both flexion and extension. Straight leg raising is negative bilaterally.  Neurologic examination shows 5 or 5 strength in the iliopsoas, quadriceps, dorsiflexor, and plantar flexor bilaterally, although she does tend to give way somewhat with the iliopsoas and quadriceps, presumably due to deconditioning.. sensory examination shows decreased pinprick in the lateral left foot, otherwise intact. Reflexes are 1 the quadriceps, minimal gastrocnemius, has symmetrical body, toes are downgoing bilaterally. Gait and stance are both severely limited due to pain and discomfort.   Data Review:CBC    Component Value Date/Time   WBC 9.6 05/03/2012 1133   RBC 5.30* 05/03/2012 1133   HGB 13.5 05/03/2012 1133   HCT 42.1 05/03/2012 1133   PLT 321 05/03/2012 1133   MCV 79.4 05/03/2012 1133   MCH 25.5* 05/03/2012 1133   MCHC 32.1  05/03/2012 1133   RDW 15.1 05/03/2012 1133   LYMPHSABS 2.1 02/26/2012 1109   MONOABS 0.7 02/26/2012 1109   EOSABS 0.2 02/26/2012 1109   BASOSABS 0.0 02/26/2012 1109                          BMET    Component Value Date/Time   NA 135 05/03/2012 1133   K 4.8 05/03/2012 1133   CL 97 05/03/2012 1133   CO2 29 05/03/2012 1133   GLUCOSE 272* 05/03/2012 1133   BUN 13 05/03/2012 1133   CREATININE 0.65 05/03/2012 1133   CALCIUM  9.8 05/03/2012 1133   GFRNONAA 84* 05/03/2012 1133   GFRAA >90 05/03/2012 1133     Assessment/Plan:  elderly debilitated female with advanced disabling degenerative changes in her low back, treated with extensive nonsurgical management and is now admitted for surgical decompression and stabilization. I spoke with the patient and her family on multiple occasions regarding the risks of surgery of from it neurologic as well as general perspective. Another set all this they wished for [place] to proceed with surgery.  I've discussed with the patient and her family the nature of his condition, the nature the surgical procedure, the typical length of surgery, hospital stay, and overall recuperation, the limitations postoperatively, and risks of surgery. I discussed risks including risks of infection, bleeding, possibly need for transfusion, the risk of nerve root dysfunction with pain, weakness, numbness, or paresthesias, the risk of dural tear and CSF leakage and possible need for further surgery, the risk of failure of the arthrodesis and possibly for further surgery, the risk of anesthetic complications including myocardial infarction, stroke, pneumonia, and death. We discussed the need for postoperative immobilization in a lumbar brace. Understanding all this the patient does wish to proceed with surgery and is admitted for such.     Hewitt Shorts, MD 05/09/2012 7:43 AM

## 2012-05-09 NOTE — Anesthesia Procedure Notes (Addendum)
Performed by: Campbell Lerner M    Procedure Name: Intubation Date/Time: 05/09/2012 8:05 AM Performed by: Ellin Goodie Pre-anesthesia Checklist: Patient identified, Emergency Drugs available, Suction available, Patient being monitored and Timeout performed Patient Re-evaluated:Patient Re-evaluated prior to inductionOxygen Delivery Method: Circle system utilized Preoxygenation: Pre-oxygenation with 100% oxygen Intubation Type: IV induction Ventilation: Mask ventilation without difficulty Laryngoscope Size: Mac and 4 Grade View: Grade I Tube type: Oral Tube size: 7.5 mm Number of attempts: 1 Airway Equipment and Method: Stylet Placement Confirmation: ETT inserted through vocal cords under direct vision,  positive ETCO2 and breath sounds checked- equal and bilateral Secured at: 22 cm Tube secured with: Tape Dental Injury: Teeth and Oropharynx as per pre-operative assessment

## 2012-05-09 NOTE — Clinical Social Work Note (Signed)
Clinical Social Work   CSW received consult for SNF placement. Assessment to follow, as pt had surgery today. Awaiting PT/OT evals. Per RN, pt was admitted from SNF and the family is agreeable to pt return to SNF. CSW will continue to follow. Please call with any urgent needs.  Dede Query, MSW, Theresia Majors [phone]

## 2012-05-09 NOTE — Progress Notes (Signed)
Filed Vitals:   05/09/12 1450 05/09/12 1451 05/09/12 1452 05/09/12 1510  BP: 162/70   170/77  Pulse: 81 84 78 72  Temp: 98.2 F (36.8 C)   98.5 F (36.9 C)  TempSrc:    Oral  Resp: 14 13 14 14   SpO2: 100% 100% 100% 100%    Patient resting in bed, describes mild incisional discomfort. Taking by mouth, but has mild nausea, to be given some Vistaril. Dressing clean and dry. Moving all extremities well. Foley to straight drainage.  Plan: Awaiting PT and OT to work with patient on mobilization and ADLs. Lumbar brace to be donned and doffed out of bed, ideally when standing. Spoke with patient, her husband, her son, and her daughter-in-law, regarding disposition to skilled nursing facility for rehabilitation following initial recovery in hospital. They would like to consider options in addition to [facility], and I discussed with them [facility] and [facility]. They plan on discussing this with social work. Will leave Foley in until patient begins to mobilize.   Hewitt Shorts, MD 05/09/2012, 6:25 PM

## 2012-05-09 NOTE — Progress Notes (Signed)
UR COMPLETED  

## 2012-05-09 NOTE — Anesthesia Preprocedure Evaluation (Addendum)
Anesthesia Evaluation  Patient identified by MRN, date of birth, ID band Patient awake    Reviewed: Allergy & Precautions, H&P , NPO status , Patient's Chart, lab work & pertinent test results, reviewed documented beta blocker date and time   History of Anesthesia Complications Negative for: history of anesthetic complications  Airway Mallampati: I TM Distance: >3 FB Neck ROM: Full    Dental  (+) Partial Lower, Edentulous Upper and Dental Advisory Given   Pulmonary neg pulmonary ROS,  breath sounds clear to auscultation  Pulmonary exam normal       Cardiovascular hypertension (borderline, off meds presently), Rhythm:Regular Rate:Normal     Neuro/Psych PSYCHIATRIC DISORDERS Anxiety Depression Chronic back pain: wheelchair bound, daily narcotics    GI/Hepatic Neg liver ROS, GERD-  Medicated and Controlled,  Endo/Other  diabetes (glu 162), Well Controlled, Type 1, Insulin Dependent  Renal/GU negative Renal ROS     Musculoskeletal   Abdominal   Peds  Hematology negative hematology ROS (+)   Anesthesia Other Findings   Reproductive/Obstetrics                          Anesthesia Physical Anesthesia Plan  ASA: III  Anesthesia Plan: General   Post-op Pain Management:    Induction: Intravenous  Airway Management Planned: Oral ETT  Additional Equipment:   Intra-op Plan:   Post-operative Plan: Extubation in OR  Informed Consent: I have reviewed the patients History and Physical, chart, labs and discussed the procedure including the risks, benefits and alternatives for the proposed anesthesia with the patient or authorized representative who has indicated his/her understanding and acceptance.   Dental advisory given  Plan Discussed with: Surgeon and CRNA  Anesthesia Plan Comments: (Plan routine monitors, GETA)        Anesthesia Quick Evaluation

## 2012-05-09 NOTE — Transfer of Care (Signed)
Immediate Anesthesia Transfer of Care Note  Patient: [name]  Procedure(s) Performed: Procedure(s) (LRB) with comments: POSTERIOR LUMBAR FUSION 2 LEVEL (N/A) - Lumbar Three-Four, Four-Five laminectomies with Lumbar Three-Four, Lumbar Four-Five Posterior lumbar interbody fusion with interbody prothesis posterolateral arthrodesis and posterior segmental instrumentation  Patient Location: PACU  Anesthesia Type: General  Level of Consciousness: awake, alert  and oriented  Airway & Oxygen Therapy: Patient Spontanous Breathing  Post-op Assessment: Report given to PACU RN  Post vital signs: stable  Complications: No apparent anesthesia complications

## 2012-05-09 NOTE — Anesthesia Postprocedure Evaluation (Signed)
  Anesthesia Post-op Note  Patient: [name]  Procedure(s) Performed: Procedure(s) (LRB) with comments: POSTERIOR LUMBAR FUSION 2 LEVEL (N/A) - Lumbar Three-Four, Four-Five laminectomies with Lumbar Three-Four, Lumbar Four-Five Posterior lumbar interbody fusion with interbody prothesis posterolateral arthrodesis and posterior segmental instrumentation  Patient Location: PACU  Anesthesia Type: General  Level of Consciousness: awake, alert , oriented, patient cooperative and responds to stimulation  Airway and Oxygen Therapy: Patient Spontanous Breathing and Patient connected to nasal cannula oxygen  Post-op Pain: none  Post-op Assessment: Post-op Vital signs reviewed, Patient's Cardiovascular Status Stable, Respiratory Function Stable, Patent Airway and Pain level controlled, nausea improved  Post-op Vital Signs: Reviewed and stable  Complications: No apparent anesthesia complications

## 2012-05-10 LAB — GLUCOSE, CAPILLARY
Glucose-Capillary: 76 mg/dL (ref 70–99)
Glucose-Capillary: 69 mg/dL — ABNORMAL LOW (ref 70–99)
Glucose-Capillary: 77 mg/dL (ref 70–99)
Glucose-Capillary: 99 mg/dL (ref 70–99)

## 2012-05-10 NOTE — Clinical Social Work Placement (Addendum)
    Clinical Social Work Department CLINICAL SOCIAL WORK PLACEMENT NOTE 05/10/2012  Patient:  ORENE, ABBASI  Account Number:  [id number] Admit date:  05/09/2012  Clinical Social Worker:  Peggyann Shoals  Date/time:  05/10/2012 11:29 AM  Clinical Social Work is seeking post-discharge placement for this patient at the following level of care:   SKILLED NURSING   (*CSW will update this form in Epic as items are completed)   05/10/2012  Patient/family provided with [facility] Department of Clinical Social Work's list of facilities offering this level of care within the geographic area requested by the patient (or if unable, by the patient's family).  05/10/2012  Patient/family informed of their freedom to choose among providers that offer the needed level of care, that participate in Medicare, Medicaid or managed care program needed by the patient, have an available bed and are willing to accept the patient.  05/10/2012  Patient/family informed of [facility]' ownership interest in [facility], as well as of the fact that they are under no obligation to receive care at this facility.  PASARR submitted to EDS on 02/01/2012 PASARR number received from [employer] on 02/01/2012  FL2 transmitted to all facilities in geographic area requested by pt/family on  05/10/2012 FL2 transmitted to all facilities within larger geographic area on   Patient informed that his/her managed care company has contracts with or will negotiate with  certain facilities, including the following:     Patient/family informed of bed offers received:  05/12/2012 Patient chooses bed at [facility] Physician recommends and patient chooses bed at  SNF  Patient to be transferred to [facility] on 05/14/2012 (JB) Patient to be transferred to facility by [facility])  The following physician request were entered in Epic:   Additional Comments:

## 2012-05-10 NOTE — Evaluation (Signed)
Physical Therapy Evaluation Patient Details Name: [name] MRN: [id number] DOB: June 02, 1933 Today's Date: 05/10/2012 Time: 6213-0865 PT Time Calculation (min): 21 min  PT Assessment / Plan / Recommendation Clinical Impression  Mrs. Jonaitis is 78 y/o female s/p back fusion who presents to PT with below problem list. Will benefit physical therapy to maximize function and facilitate a safe d/c to next venue. Rec continued therapies at SNF level prior to d/c home.     PT Assessment  Patient needs continued PT services    Follow Up Recommendations  Skilled Nursing Facility   Barriers to Discharge        Equipment Recommendations  None recommended by PT    Recommendations for Other Services     Frequency Min 5X/week    Precautions / Restrictions Precautions Precautions: Fall;Back Required Braces or Orthoses: Spinal Brace Spinal Brace: Lumbar corset;Applied in standing position Restrictions Weight Bearing Restrictions: No         Mobility  Bed Mobility Details for Bed Mobility Assistance: Pt already up in recliner upon arrival Transfers Sit to Stand: 4: Min assist;With upper extremity assist;With armrests;From chair/3-in-1 Stand to Sit: 4: Min assist;With upper extremity assist;With armrests;To chair/3-in-1 Details for Transfer Assistance: min facilitation at hips for anterior translation of trunk over BOS, cues for safe hand placement and to lock her walker in prep to stand, sequencing cues to sit Ambulation/Gait Ambulation/Gait Assistance: 4: Min assist Ambulation Distance (Feet): 60 Feet Assistive device: 4-wheeled walker Ambulation/Gait Assistance Details: min tactle cues for upright posture and wider BOS  Gait Pattern: Step-through pattern;Decreased step length - left;Decreased step length - right;Trunk flexed;Left flexed knee in stance;Right flexed knee in stance Gait velocity: slowed         Exercises General Exercises - Lower Extremity Ankle Circles/Pumps:  AROM;Both;10 reps;Seated   PT Diagnosis: Difficulty walking;Abnormality of gait;Generalized weakness;Acute pain  PT Problem List: Decreased strength;Decreased balance;Decreased mobility;Decreased knowledge of precautions;Decreased safety awareness;Decreased knowledge of use of DME;Decreased activity tolerance;Decreased cognition;Pain;Impaired sensation PT Treatment Interventions: DME instruction;Gait training;Functional mobility training;Therapeutic activities;Therapeutic exercise;Balance training;Neuromuscular re-education;Patient/family education   PT Goals Acute Rehab PT Goals PT Goal Formulation: With patient Time For Goal Achievement: 05/17/12 Potential to Achieve Goals: Good Pt will Roll Supine to Right Side: with supervision PT Goal: Rolling Supine to Right Side - Progress: Goal set today Pt will Roll Supine to Left Side: with supervision PT Goal: Rolling Supine to Left Side - Progress: Goal set today Pt will go Supine/Side to Sit: with supervision PT Goal: Supine/Side to Sit - Progress: Goal set today Pt will go Sit to Supine/Side: with supervision PT Goal: Sit to Supine/Side - Progress: Goal set today Pt will go Sit to Stand: with supervision PT Goal: Sit to Stand - Progress: Goal set today Pt will go Stand to Sit: with supervision PT Goal: Stand to Sit - Progress: Goal set today Pt will Transfer Bed to Chair/Chair to Bed: with supervision PT Transfer Goal: Bed to Chair/Chair to Bed - Progress: Goal set today Pt will Ambulate: >150 feet;with supervision;with least restrictive assistive device PT Goal: Ambulate - Progress: Goal set today Pt will Perform Home Exercise Program: with supervision, verbal cues required/provided PT Goal: Perform Home Exercise Program - Progress: Goal set today Additional Goals Additional Goal #1: Pt will demonstrate and verbalize understanding of 3/3 back precautions.  PT Goal: Additional Goal #1 - Progress: Goal set today  Visit Information  Last  PT Received On: 05/10/12 Assistance Needed: +1 PT/OT Co-Evaluation/Treatment: Yes    Subjective  Data  Subjective: I haven't been walking because of my back pain.    Prior Functioning  Home Living Lives With:  (has been at SNF since this summer) Available Help at Discharge: Skilled Nursing Facility Home Adaptive Equipment: Dan Humphreys - four wheeled Prior Function Level of Independence: Needs assistance Needs Assistance: Bathing;Dressing;Toileting;Meal Prep;Light Housekeeping Bath: Moderate Dressing: Moderate Toileting: Moderate Meal Prep: Total Light Housekeeping: Total Able to Take Stairs?: No Driving:  (prior to last admission to the hospital) Vocation: Retired Musician: No difficulties Dominant Hand: Right    Cognition  Overall Cognitive Status: Impaired Area of Impairment: Memory;Following commands;Safety/judgement Arousal/Alertness: Awake/alert Orientation Level: Appears intact for tasks assessed Behavior During Session: [facility] for tasks performed Memory Deficits: Said she was at home prior to admission, has been at a SNF since July Safety/Judgement: Decreased awareness of safety precautions Safety/Judgement - Other Comments: Verbal cues for hand placement for sit to stand and stand to sit    Extremity/Trunk Assessment Right Upper Extremity Assessment RUE ROM/Strength/Tone: Within functional levels Left Upper Extremity Assessment LUE ROM/Strength/Tone: Within functional levels Right Lower Extremity Assessment RLE ROM/Strength/Tone: Deficits RLE ROM/Strength/Tone Deficits: grossly 3-4/5, generally weak 2/2 having not ambulated since July RLE Sensation: History of peripheral neuropathy Left Lower Extremity Assessment LLE ROM/Strength/Tone: Deficits LLE ROM/Strength/Tone Deficits: grossly 3-4/5, generally weak 2/2 having not ambulated since July LLE Sensation: History of peripheral neuropathy   Balance    End of Session PT - End of Session Equipment  Utilized During Treatment: Gait belt Activity Tolerance: Patient tolerated treatment well;Patient limited by pain Patient left: in chair;with call bell/phone within reach Nurse Communication: Mobility status  GP     [facility] [name] 05/10/2012, 11:37 AM

## 2012-05-10 NOTE — Progress Notes (Signed)
Inpatient Diabetes Program Recommendations  AACE/ADA: New Consensus Statement on Inpatient Glycemic Control (2013)  Target Ranges:  Prepandial:   less than 140 mg/dL      Peak postprandial:   less than 180 mg/dL (1-2 hours)      Critically ill patients:  140 - 180 mg/dL   Reason for Visit: Hyperglycemia  Inpatient Diabetes Program Recommendations Correction (SSI): Add Novolog moderate tidwc

## 2012-05-10 NOTE — Clinical Social Work Psychosocial (Signed)
     Clinical Social Work Department BRIEF PSYCHOSOCIAL ASSESSMENT 05/10/2012  Patient:  [name], [name]     Account Number:  [id number]     Admit date:  05/09/2012  Clinical Social Worker:  Peggyann Shoals  Date/Time:  05/10/2012 11:17 AM  Referred by:  Physician  Date Referred:  05/09/2012 Referred for  SNF Placement  Other - See comment   Other Referral:   Pt was admitted from a facility.   Interview type:  Family Other interview type:    PSYCHOSOCIAL DATA Living Status:  FACILITY Admitted from facility:  [facility] Level of care:  Skilled Nursing Facility Primary support name:  [name] [phone]) Primary support relationship to patient:  CHILD, ADULT Degree of support available:   Very supportive. Pt also has a very supportive husband, [name].    CURRENT CONCERNS Current Concerns  Post-Acute Placement   Other Concerns:    SOCIAL WORK ASSESSMENT / PLAN CSW met with pt and husband to address consult. CSW introduced herself and explained role of social work. CSW also explained the process of returnin to facilities. Pt's husband shared that pt has not walked since June 18 and has been a resident of [facility] since then. Pt worked with PT and walked for the first time.    At pt's and husband's request, CSW spoke with pt's son, [name], to address discharge plan on the phone. CSW explained process of SNF placement and answered questions. Pt's son shared that he would be interested in other facilities. Pt's son shared that he and pot's husband applied for Long Term Medicaid (pending # [id number]) and is waiting for the approval. Pt's son stated that he is unsure if pt will be long term or short term placement. Pt's son is agreeable to have pt return to [facility] if nother placement is not available at discharge.    CSW will initiate SNF referral for [facility] and follow up with bed offers. CSW will continue to follow.    Assessment/plan status:  Psychosocial Support/Ongoing Assessment of Needs Other assessment/ plan:   Information/referral to community resources:   SNF List    PATIENTS/FAMILYS RESPONSE TO PLAN OF CARE: Pt was alert and oriented. Pt worked with PT during interview. Pt's huaband and son were very pleasant and agreeable to discharge plan to SNF.

## 2012-05-10 NOTE — Progress Notes (Signed)
Hypoglycemic Event  CBG: 69  Treatment: 15 GM carbohydrate snack  Symptoms: None  Follow-up CBG: Time:1207 CBG Result:77  Possible Reasons for Event: Inadequate meal intake  Comments/MD notified:    Carman Ching, RN  Remember to initiate Hypoglycemia Order Set & complete

## 2012-05-10 NOTE — Evaluation (Signed)
Occupational Therapy Evaluation Patient Details Name: [name] MRN: [id number] DOB: 05/24/1933 Today's Date: 05/10/2012 Time: 1308-6578 OT Time Calculation (min): 23 min  OT Assessment / Plan / Recommendation Clinical Impression  This 78 yo female s/p back fusion presents to acute OT with problems below. WIll benefit from acute OT with follow up at SNF to get to a point where she can D/C home at an I/Mod I/S level.    OT Assessment  Patient needs continued OT Services    Follow Up Recommendations  Skilled nursing facility    Barriers to Discharge Decreased caregiver support    Equipment Recommendations  3 in 1 bedside comode       Frequency  Min 2X/week    Precautions / Restrictions Precautions Precautions: Fall;Back Restrictions Weight Bearing Restrictions: No   Pertinent Vitals/Pain 5/10 back    ADL  Eating/Feeding: Simulated;Supervision/safety;Set up Where Assessed - Eating/Feeding: Chair Grooming: Simulated;Supervision/safety;Set up Where Assessed - Grooming: Supported sitting Upper Body Bathing: Simulated;Set up;Supervision/safety Where Assessed - Upper Body Bathing: Unsupported sitting Lower Body Bathing: Simulated;Moderate assistance Where Assessed - Lower Body Bathing: Supported sit to stand Upper Body Dressing: Simulated;Set up;Supervision/safety Where Assessed - Upper Body Dressing: Supported sitting Lower Body Dressing: Simulated;+1 Total assistance Where Assessed - Lower Body Dressing: Supported sit to stand Toilet Transfer: Mining engineer Method: Sit to Barista:  (Recliner out into hallway and back to recliner) Toileting - Clothing Manipulation and Hygiene: Simulated;Moderate assistance Where Assessed - Toileting Clothing Manipulation and Hygiene: Standing Equipment Used: Back brace;Rolling walker;Gait belt Transfers/Ambulation Related to ADLs: Min A sit to stand and ambulation ADL Comments: Pt  cannot cross one leg over the other for LB ADLs    OT Diagnosis: Generalized weakness;Cognitive deficits;Acute pain  OT Problem List: Decreased strength;Impaired balance (sitting and/or standing);Decreased cognition;Decreased safety awareness;Decreased knowledge of use of DME or AE;Decreased knowledge of precautions;Pain OT Treatment Interventions: Self-care/ADL training;DME and/or AE instruction;Cognitive remediation/compensation;Patient/family education;Balance training   OT Goals Acute Rehab OT Goals OT Goal Formulation: With patient Time For Goal Achievement: 05/17/12 Potential to Achieve Goals: Good ADL Goals Pt Will Perform Grooming: with set-up;with supervision;Unsupported;Standing at sink (2 tasks) ADL Goal: Grooming - Progress: Goal set today Pt Will Perform Lower Body Bathing: with set-up;with supervision;Unsupported;Sit to stand from chair;Sit to stand from bed;with adaptive equipment ADL Goal: Lower Body Bathing - Progress: Goal set today Pt Will Perform Lower Body Dressing: with set-up;with supervision;Unsupported;with adaptive equipment;Sit to stand from chair;Sit to stand from bed ADL Goal: Lower Body Dressing - Progress: Goal set today Pt Will Transfer to Toilet: with supervision;Ambulation;Comfort height toilet;Grab bars;3-in-1 ADL Goal: Toilet Transfer - Progress: Goal set today Pt Will Perform Toileting - Clothing Manipulation: with supervision;Standing ADL Goal: Toileting - Clothing Manipulation - Progress: Goal set today Pt Will Perform Toileting - Hygiene: with supervision;Standing at 3-in-1/toilet ADL Goal: Toileting - Hygiene - Progress: Goal set today Miscellaneous OT Goals Miscellaneous OT Goal #1: Pt will be Supervision with in and OOB for BADLs. OT Goal: Miscellaneous Goal #1 - Progress: Goal set today Miscellaneous OT Goal #2: Pt will be able to state 3/3 back precautions OT Goal: Miscellaneous Goal #2 - Progress: Goal set today  Visit Information  Last OT  Received On: 05/10/12 Assistance Needed: +1 PT/OT Co-Evaluation/Treatment: Yes    Subjective Data  Patient Stated Goal: To be able to walk again with my walker   Prior Functioning     Home Living Lives With:  (at SNF) Available Help at Discharge:  Skilled Nursing Facility Home Adaptive Equipment: Dan Humphreys - four wheeled Prior Function Level of Independence: Needs assistance Needs Assistance: Bathing;Dressing;Toileting;Meal Prep;Light Housekeeping Bath: Moderate Dressing: Moderate Toileting: Moderate Meal Prep: Total Light Housekeeping: Total Able to Take Stairs?: No Driving: Yes Vocation: Retired Musician: No difficulties Dominant Hand: Right            Cognition  Overall Cognitive Status: Impaired Area of Impairment: Memory;Following commands;Safety/judgement Arousal/Alertness: Awake/alert Orientation Level: Appears intact for tasks assessed Behavior During Session: [facility] for tasks performed Memory Deficits: Said she was at home prior to admission, has been at a SNF since July Safety/Judgement: Decreased awareness of safety precautions Safety/Judgement - Other Comments: Verbal cues for hand placement for sit to stand and stand to sit    Extremity/Trunk Assessment Right Upper Extremity Assessment RUE ROM/Strength/Tone: Within functional levels Left Upper Extremity Assessment LUE ROM/Strength/Tone: Within functional levels     Mobility Bed Mobility Details for Bed Mobility Assistance: Pt already up in recliner upon arrival Transfers Transfers: Sit to Stand;Stand to Sit Sit to Stand: 4: Min assist;With upper extremity assist;With armrests;From chair/3-in-1 Stand to Sit: 4: Min assist;With upper extremity assist;With armrests;To chair/3-in-1 Details for Transfer Assistance: Verbal cues for hand placement              End of Session OT - End of Session Equipment Utilized During Treatment: Gait belt;Back brace (RW) Activity Tolerance: Patient  tolerated treatment well Patient left: in chair;with call bell/phone within reach;with family/visitor present       Evette Georges [phone] 05/10/2012, 11:31 AM

## 2012-05-10 NOTE — Progress Notes (Signed)
Inpatient Diabetes Program Recommendations  AACE/ADA: New Consensus Statement on Inpatient Glycemic Control (2013)  Target Ranges:  Prepandial:   less than 140 mg/dL      Peak postprandial:   less than 180 mg/dL (1-2 hours)      Critically ill patients:  140 - 180 mg/dL   Reason for Visit: Hyperglycemia  Patient is a 78 y.o. female who is admitted for treatment of advanced multilevel degenerative changes in the lumbar spine.patient has a dynamic degenerative grade 1 spondylolisthesis of L4 and 5, degenerative scoliosis lumbar spine and stenosis at L4-5 worse than at L3-4.she is admitted for an L3-L5 decompressive lumbar laminectomy, L3-4 and L4-5 motion lumbar interbody arthrodesis and posterior lateral arthrodesis with interbody implants, postreduction patient, and bone graft. She's been treated with extensive nonsurgical management without relief.   Results for MARAE, BLAN (MRN [id number]) as of 05/10/2012 15:28  Ref. Range 05/03/2012 11:33  Glucose Latest Range: 70-99 mg/dL 409 (H)  Results for BELLAANN, RODER (MRN [id number]) as of 05/10/2012 15:28  Ref. Range 05/03/2012 11:33  Sodium Latest Range: 135-145 mEq/L 135  Potassium Latest Range: 3.5-5.1 mEq/L 4.8  Chloride Latest Range: 96-112 mEq/L 97  CO2 Latest Range: 19-32 mEq/L 29  BUN Latest Range: 6-23 mg/dL 13  Creatinine Latest Range: 0.50-1.10 mg/dL 9.56  Calcium Latest Range: 8.4-10.5 mg/dL 9.8  GFR calc non Af Amer Latest Range: >90 mL/min 84 (L)  GFR calc Af Amer Latest Range: >90 mL/min >90  Glucose Latest Range: 70-99 mg/dL 213 (H)       Inpatient Diabetes Program Recommendations Correction (SSI): Add Novolog moderate tidwc  Will continue to follow.

## 2012-05-10 NOTE — Progress Notes (Addendum)
INITIAL ADULT NUTRITION ASSESSMENT Date: 05/10/2012   Time: 2:44 PM Reason for Assessment: MST  INTERVENTION:  Reviewed importance of adequate nutrition with pt/family Ensure Complete po BID, each supplement provides 350 kcal and 13 grams of protein. Encouraged pt to order meals based on her pt preferences, explained alternate menu to pt/family   DOCUMENTATION CODES Per approved criteria  -Not Applicable     ASSESSMENT: Female 78 y.o.  Dx: Posterior Lumbar Fusion  Hx:  Past Medical History  Diagnosis Date  . Incontinence of urine   . Diarrhea   . Neuropathy     bilateral feet  . GERD (gastroesophageal reflux disease)   . Depression   . Urinary, incontinence, stress female   . History of shingles 2009  . Degenerative lumbar spinal stenosis   . Type II diabetes mellitus     Type 2 IDDM  X 40 years;   . Arthritis     lower back/hands   Past Surgical History  Procedure Date  . Incontinence surgery 2005  . Tonsillectomy 1964  . Abdominal hysterectomy     1982  . Ovarian cyst surgery 1972  . Cholecystectomy 1988  . Cystoscopy 2009    macroplastique inj  . Rectocele repair 10/15/2011    Procedure: POSTERIOR REPAIR (RECTOCELE);  Surgeon: Kathi Ludwig, MD;  Location: [facility];  Service: Urology;;  posterior vaginal vault repair with sacral spinus repair with graft  . Cystoscopy 10/15/2011    Procedure: CYSTOSCOPY;  Surgeon: Kathi Ludwig, MD;  Location: [facility];  Service: Urology;  Laterality: N/A;  . Flexible sigmoidoscopy 02/28/2012    Procedure: FLEXIBLE SIGMOIDOSCOPY;  Surgeon: Willis Modena, MD;  Location: [facility];  Service: Endoscopy;  Laterality: N/A;  . Breast biopsy 1990    bilaterally  . Cataract extraction w/ intraocular lens implant   . Posterior fusion lumbar spine 05/09/2012    Related Meds:     . acetaminophen  1,000 mg Intravenous Q6H  . bacitracin      . ceFAZolin      . cholecalciferol   1,000 Units Oral Daily  . DULoxetine  30 mg Oral Daily  . HYDROmorphone      . insulin aspart  8 Units Subcutaneous TID WC  . insulin glargine  30 Units Subcutaneous QHS  . ketorolac  15 mg Intravenous Q6H  . ketorolac      . LORazepam  0.5 mg Oral Q8H  . morphine  30 mg Oral BID  . pantoprazole  40 mg Oral Daily  . polyethylene glycol  17 g Oral QPM  . promethazine      . senna-docusate  2 tablet Oral QHS  . sodium chloride      . sodium chloride  3 mL Intravenous Q12H  . DISCONTD: insulin NPH  8 Units Subcutaneous TID WC   Ht: 5\' 3"  (160 cm)  Wt: 159 lb 4.8 oz (72.258 kg)  Ideal Wt: 52.2 kg % Ideal Wt: 139%  Usual Wt: Pt/family unsure Wt Readings from Last 10 Encounters:  05/09/12 159 lb 4.8 oz (72.258 kg)  05/09/12 159 lb 4.8 oz (72.258 kg)  03/03/12 159 lb 6.3 oz (72.3 kg)  03/03/12 159 lb 6.3 oz (72.3 kg)  02/01/12 148 lb (67.132 kg)  12/12/11 176 lb 3.2 oz (79.924 kg)  10/09/11 168 lb (76.204 kg)  10/09/11 168 lb (76.204 kg)   % Usual Wt: -  Body mass index is 28.22 kg/(m^2). Overweight  Food/Nutrition Related Hx: Pt  and family unsure of weight loss but they do report a decreased appetite since 10/2011 due to back pain. Pt reports eating less at SNF due to food preferences. Has been drinking some ensure while there.   Labs:  CMP     Component Value Date/Time   NA 135 05/03/2012 1133   K 4.8 05/03/2012 1133   CL 97 05/03/2012 1133   CO2 29 05/03/2012 1133   GLUCOSE 272* 05/03/2012 1133   BUN 13 05/03/2012 1133   CREATININE 0.65 05/03/2012 1133   CALCIUM 9.8 05/03/2012 1133   PROT 5.3* 02/26/2012 1109   ALBUMIN 2.4* 02/26/2012 1109   AST 11 02/26/2012 1109   ALT 8 02/26/2012 1109   ALKPHOS 81 02/26/2012 1109   BILITOT 0.3 02/26/2012 1109   GFRNONAA 84* 05/03/2012 1133   GFRAA >90 05/03/2012 1133   CBG (last 3)   Basename 05/10/12 1207 05/10/12 1136 05/10/12 0640  GLUCAP 77 69* 76    Intake/Output Summary (Last 24 hours) at 05/10/12 1447 Last data filed at  05/10/12 1140  Gross per 24 hour  Intake    123 ml  Output    900 ml  Net   -777 ml    Diet Order: CHO Modified Medium Meal completion about 25% per family  Supplements/Tube Feeding: none  IVF:    sodium chloride   sodium chloride Last Rate: 40 mL/hr (05/10/12 1017)    Estimated Nutritional Needs:   Kcal:  1550-1700 Protein:  75-85 grams Fluid:  >1.6 L/day  NUTRITION DIAGNOSIS: -Inadequate oral intake (NI-2.1).  Status: Ongoing  RELATED TO: decreased appetite  AS EVIDENCE BY: per pt report and meal completion </= 25%  MONITORING/EVALUATION(Goals): Goal: Pt to meet >/= 90% of their estimated nutrition needs. Monitor: PO intake, supplement acceptance  EDUCATION NEEDS: -Education needs addressed  Kendell Bane RD, LDN, CNSC [phone] Pager [phone] After Hours Pager  05/10/2012, 2:44 PM

## 2012-05-10 NOTE — Progress Notes (Signed)
Filed Vitals:   05/09/12 2000 05/09/12 2237 05/10/12 0243 05/10/12 0553  BP:  129/46 124/46 143/53  Pulse:  94 88 92  Temp:  98.6 F (37 C) 98.3 F (36.8 C) 98 F (36.7 C)  TempSrc:  Oral Oral Oral  Resp:  18 18 20   Height: 5\' 3"  (1.6 m)     Weight: 72.258 kg (159 lb 4.8 oz)     SpO2:  100% 100% 100%    Patient resting fairly comfortably in bed. Does note mild discomfort in incisional area. Not having any pain or discomfort in lower extremities, which is a significant improvement as compared to prior to surgery. Accu-Cheks good. Taking by mouth. Awaiting PT and OT. Discussed with nursing staff disposition plans.  Plan: Mobilize. Don and doff brace while standing. We will decrease of IVF to 40 cc per hour. We'll DC dressing in a.m. Spoke with patient and her son today, and also spoke with her primary physician, Dr. Kirby Funk, who saw patient this morning.  Hewitt Shorts, MD 05/10/2012, 8:22 AM

## 2012-05-11 LAB — GLUCOSE, CAPILLARY
Glucose-Capillary: 89 mg/dL (ref 70–99)
Glucose-Capillary: 180 mg/dL — ABNORMAL HIGH (ref 70–99)
Glucose-Capillary: 70 mg/dL (ref 70–99)
Glucose-Capillary: 90 mg/dL (ref 70–99)
Glucose-Capillary: 196 mg/dL — ABNORMAL HIGH (ref 70–99)
Glucose-Capillary: 155 mg/dL — ABNORMAL HIGH (ref 70–99)

## 2012-05-11 LAB — TYPE AND SCREEN
ABO/RH(D): O POS
Antibody Screen: NEGATIVE
Unit division: 0
Unit division: 0

## 2012-05-11 NOTE — Progress Notes (Signed)
Physical Therapy Treatment Patient Details Name: [name] MRN: [id number] DOB: 16-Dec-1932 Today's Date: 05/11/2012 Time: 7829-5621 PT Time Calculation (min): 20 min  PT Assessment / Plan / Recommendation Comments on Treatment Session  Continues to progress well. Encouraged pt to ambulated with the nursing staff again today.     Follow Up Recommendations  Supervision/Assistance - 24 hour; SNF   Barriers to Discharge        Equipment Recommendations  None recommended by PT    Recommendations for Other Services    Frequency Min 5X/week   Plan Discharge plan remains appropriate;Frequency remains appropriate    Precautions / Restrictions Precautions Precautions: Fall;Back Precaution Booklet Issued: Yes (comment) Spinal Brace: Lumbar corset;Applied in sitting position       Mobility  Transfers Sit to Stand: 4: Min assist;With upper extremity assist;From bed;From chair/3-in-1;With armrests Stand to Sit: With upper extremity assist;To chair/3-in-1;With armrests Details for Transfer Assistance: cues for hand placement, facilitation for tall posture Ambulation/Gait Ambulation/Gait Assistance: 4: Min assist Ambulation Distance (Feet): 200 Feet (with seated rest break after 100 ft) Assistive device: 4-wheeled walker Ambulation/Gait Assistance Details: cues for tall posture and wider BOS, cues for safe use of RW Gait Pattern: Step-through pattern;Trunk flexed    Exercises General Exercises - Lower Extremity Ankle Circles/Pumps: AROM;Both;10 reps;Supine     PT Goals Acute Rehab PT Goals PT Goal: Rolling Supine to Left Side - Progress: Progressing toward goal PT Goal: Supine/Side to Sit - Progress: Progressing toward goal PT Goal: Sit to Stand - Progress: Progressing toward goal PT Goal: Stand to Sit - Progress: Progressing toward goal PT Transfer Goal: Bed to Chair/Chair to Bed - Progress: Progressing toward goal PT Goal: Ambulate - Progress: Progressing toward  goal Additional Goals PT Goal: Additional Goal #1 - Progress: Progressing toward goal  Visit Information  Last PT Received On: 05/11/12 Assistance Needed: +1 (+2 chair follow)    Subjective Data  Subjective: I walked yesterday.    Cognition  Area of Impairment: Memory;Problem solving Cognition - Other Comments: slower processing, problem solving cues for basic functional tasks    Balance     End of Session PT - End of Session Equipment Utilized During Treatment: Gait belt Activity Tolerance: Patient tolerated treatment well Patient left: in chair;with call bell/phone within reach;with family/visitor present Nurse Communication: Mobility status   GP     [facility] [name] 05/11/2012, 3:32 PM

## 2012-05-11 NOTE — Progress Notes (Signed)
RN and tech stood patient to walk her.  Pt began to buckle and knees and slide.  Had to lye her in bed. Pt currently stable and under no s/s distress.

## 2012-05-11 NOTE — Progress Notes (Signed)
Filed Vitals:   05/10/12 1914 05/10/12 2141 05/11/12 0203 05/11/12 0629  BP: 112/40 137/58 137/41 136/45  Pulse: 75 111 87 83  Temp: 98.1 F (36.7 C) 98.4 F (36.9 C) 98 F (36.7 C) 98.8 F (37.1 C)  TempSrc: Oral Oral Oral Oral  Resp: 20 18 18 18   Height:      Weight:      SpO2: 98% 96% 95% 95%    Patient resting comfortably in bed. Make good progress yesterday. Ambulated in the halls with PT. Notes that much less pain in lower extremities as compared to prior surgery. Patient does have incisional discomfort.  Remove dressings morning, wound clean dry, healing nicely.  Plan: Encouraged patient in staff to have her ambulate at least twice today, explained to nurse that will require one ambulation with PT, but also one ambulation with the nursing staff.  We'll DC [place] this morning.  Family and social worker working on Quar[employer] manager.  Hewitt Shorts, MD 05/11/2012, 8:42 AM

## 2012-05-12 LAB — GLUCOSE, CAPILLARY
Glucose-Capillary: 55 mg/dL — ABNORMAL LOW (ref 70–99)
Glucose-Capillary: 67 mg/dL — ABNORMAL LOW (ref 70–99)
Glucose-Capillary: 99 mg/dL (ref 70–99)
Glucose-Capillary: 84 mg/dL (ref 70–99)
Glucose-Capillary: 215 mg/dL — ABNORMAL HIGH (ref 70–99)
Glucose-Capillary: 89 mg/dL (ref 70–99)

## 2012-05-12 MED ORDER — MORPHINE SULFATE ER 15 MG PO TBCR
15.0000 mg | EXTENDED_RELEASE_TABLET | Freq: Two times a day (BID) | ORAL | Status: DC
  Administered 2012-05-12 – 2012-05-14 (×4): 15 mg via ORAL
  Filled 2012-05-12 (×4): qty 1

## 2012-05-12 MED ORDER — LORAZEPAM 0.5 MG PO TABS
0.5000 mg | ORAL_TABLET | Freq: Two times a day (BID) | ORAL | Status: DC
  Administered 2012-05-12 – 2012-05-14 (×4): 0.5 mg via ORAL
  Filled 2012-05-12 (×4): qty 1

## 2012-05-12 MED FILL — Sodium Chloride IV Soln 0.9%: INTRAVENOUS | Qty: 1000 | Status: AC

## 2012-05-12 MED FILL — Sodium Chloride Irrigation Soln 0.9%: Qty: 3000 | Status: AC

## 2012-05-12 MED FILL — Heparin Sodium (Porcine) Inj 1000 Unit/ML: INTRAMUSCULAR | Qty: 30 | Status: AC

## 2012-05-12 NOTE — Progress Notes (Signed)
Hypoglycemic Event  CBG: 55  Treatment: 15 GM carbohydrate snack  Symptoms: None  Follow-up CBG: Time: 0718  CBG Result:67 (Pt is finishing up on 15 gm Carb snack  Possible Reasons for Event: Inadequate meal intake  Comments/MD notified: Breakfast within next 30 mins, Day Shift RN notified    [name], Alexia Freestone  Remember to initiate Hypoglycemia Order Set & complete

## 2012-05-12 NOTE — Progress Notes (Signed)
Physical Therapy Treatment Patient Details Name: [name] MRN: [id number] DOB: Nov 09, 1932 Today's Date: 05/12/2012 Time: 6962-9528 PT Time Calculation (min): 23 min  PT Assessment / Plan / Recommendation Comments on Treatment Session  Slow progress today because of increased leg and back pain. Rn aware. Continued to encourage increased mobility.     Follow Up Recommendations   (SNF)    Barriers to Discharge        Equipment Recommendations  None recommended by PT    Recommendations for Other Services    Frequency     Plan Discharge plan remains appropriate;Frequency remains appropriate    Precautions / Restrictions Precautions Precautions: Fall;Back Precaution Booklet Issued: Yes (comment) Precaution Comments: reviewed all three Required Braces or Orthoses: Spinal Brace Spinal Brace: Lumbar corset;Applied in sitting position   Pertinent Vitals/Pain Pt c/o increased leg pain during ambulation today down the backs of her legs    Mobility  Bed Mobility Bed Mobility: Rolling Left;Left Sidelying to Sit;Sit to Sidelying Left;Scooting to [facility];Rolling Right Rolling Left: 4: Min assist;With rail Left Sidelying to Sit: 4: Min assist;With rails Sit to Sidelying Left: 4: Min assist;With rail Scooting to HOB: 1: +2 Total assist Scooting to [facility]: Patient Percentage: 0% Details for Bed Mobility Assistance: min facilitation and verbal cues for technique and sequencing Transfers Transfers: Sit to Stand;Stand to Sit Sit to Stand: 3: Mod assist;With upper extremity assist;From bed Stand to Sit: 4: Min assist;To bed Details for Transfer Assistance: cues for safe hand placement and facilitation at sacrum for leg and trunk extension during stand Ambulation/Gait Ambulation/Gait Assistance: 4: Min assist Ambulation Distance (Feet): 100 Feet Assistive device: Rolling walker Ambulation/Gait Assistance Details: cues for tall posture and safe foot placement as well as safety, pt became more  impulsive with increased fatigue and pain Gait Pattern: Trunk flexed;Narrow base of support      PT Goals Acute Rehab PT Goals PT Goal: Rolling Supine to Right Side - Progress: Progressing toward goal PT Goal: Rolling Supine to Left Side - Progress: Progressing toward goal PT Goal: Supine/Side to Sit - Progress: Progressing toward goal PT Goal: Sit to Supine/Side - Progress: Progressing toward goal Pt will go Stand to Sit: with modified independence PT Transfer Goal: Bed to Chair/Chair to Bed - Progress: Progressing toward goal PT Goal: Ambulate - Progress: Progressing toward goal Additional Goals PT Goal: Additional Goal #1 - Progress: Progressing toward goal  Visit Information  Last PT Received On: 05/12/12 Assistance Needed: +1    Subjective Data  Subjective: Its just so sore.  Patient Stated Goal: Husband wants her d/c date pushed back.    Cognition  Overall Cognitive Status: Impaired Area of Impairment: Memory;Problem solving Arousal/Alertness: Awake/alert Orientation Level: Appears intact for tasks assessed Behavior During Session: [facility] for tasks performed Memory: Decreased recall of precautions Memory Deficits: Educated pt on precautions at start of session, but unable to recall precautions at end of session. Following Commands: Follows one step commands consistently Safety/Judgement: Decreased awareness of safety precautions Problem Solving: Decreased problem solving when using AE for LB dressing tasks. Cognition - Other Comments: slow processing    Balance     End of Session PT - End of Session Equipment Utilized During Treatment: Gait belt Activity Tolerance: Patient limited by fatigue;Patient limited by pain Patient left: in bed;with call bell/phone within reach Nurse Communication: Mobility status   GP     [facility] [name] 05/12/2012, 3:30 PM

## 2012-05-12 NOTE — Progress Notes (Signed)
Filed Vitals:   05/12/12 0604 05/12/12 0900 05/12/12 1432 05/12/12 1904  BP: 152/63 138/57 156/68 143/44  Pulse: 103 76 109 89  Temp: 98.6 F (37 C) 98.6 F (37 C) 99.1 F (37.3 C) 99.2 F (37.3 C)  TempSrc: Oral  Oral Oral  Resp: 18 20 20 20   Height:      Weight:      SpO2: 94% 97% 95% 100%    Patient's fairly comfortable, having some incisional discomfort, but legs are certainly much less uncomfortable than prior to surgery. Continue to make progress with PT and OT, ambulating in halls and room with assistance, not only with PT, but also with nursing staff.  Spoke with the patient's son Tresa Endo, who is concerned about his mother being somewhat sedated. I reviewed her medications. She had been on Ativan 0.5 mg every 8 hours at [facility] and this was continued at the time of admission. She was also on MS Contin 30 mg twice a day and 15 mg daily. We've only had her on MS Contin 30 mg every 12 hours, as well as OxyIR when necessary. I've gone ahead and ordered the Ativan to be decreased to 0.5 mg every 12 hours, and decreased MS Contin to 15 mg every 12 hours.  We'll monitor how she does with his lower medications, with the hope that she will be more alert.  However when I saw the patient on exam today she was awake and alert.  Plan: Continue to work on disposition plans. Continue to adjust medications as described above.  Hewitt Shorts, MD 05/12/2012, 7:49 PM

## 2012-05-12 NOTE — Clinical Social Work Note (Signed)
Clinical Social Work  CSW spoke with [facility] regarding pt's anticipated discharge on Friday, 10/4. Pt's son in aware and will be contacting facility in the morning to complete admission paperwork.   FL2 is in pt's chart for MD signature.   CSW will continue to follow to facilitate discharge to [facility].   Dede Query, MSW, Theresia Majors [phone]

## 2012-05-12 NOTE — Progress Notes (Signed)
Occupational Therapy Treatment Patient Details Name: [name] MRN: [id number] DOB: October 08, 1932 Today's Date: 05/12/2012 Time: 1027-2536 OT Time Calculation (min): 24 min  OT Assessment / Plan / Recommendation Comments on Treatment Session Pt slowly progressing towards goals.  Continues to have difficulty recalling back precautions.  Continue to recommend SNF to progress rehab.    Follow Up Recommendations  Skilled nursing facility    Barriers to Discharge       Equipment Recommendations  3 in 1 bedside comode    Recommendations for Other Services    Frequency Min 2X/week   Plan Discharge plan remains appropriate    Precautions / Restrictions Precautions Precautions: Fall;Back Precaution Booklet Issued: Yes (comment) Precaution Comments: Pt able to recall 1/3 back precautions.  Reviewed all 3 with pt. Required Braces or Orthoses: Spinal Brace Spinal Brace: Lumbar corset;Applied in sitting position   Pertinent Vitals/Pain See vitals    ADL  Lower Body Dressing: Performed;Maximal assistance Where Assessed - Lower Body Dressing: Unsupported sitting Toilet Transfer: Simulated;Minimal assistance Toilet Transfer Method: Sit to stand Toilet Transfer Equipment:  (recliner throughout room and then returned to recliner) Equipment Used: Back brace;Rolling walker;Sock aid;Reacher Transfers/Ambulation Related to ADLs: min assist with RW for ambulation throughout room ADL Comments: Pt sitting in chair upon OT arrival with back brace, however back brace was positioned too high on trunk.  Pt required max assist to reposition back brace correctly. Educated in depth on 3/3 back precautions.  Pt is able to recall "no bending" but does not have clear understanding of what this means.  Educated pt on use of reacher and sock aid, but pt still required max assist with AE due to unable to problem solve and unable to retain AE education information.      OT Diagnosis:    OT Problem List:   OT  Treatment Interventions:     OT Goals ADL Goals Pt Will Perform Lower Body Dressing: with set-up;with supervision;Unsupported;with adaptive equipment;Sit to stand from chair;Sit to stand from bed ADL Goal: Lower Body Dressing - Progress: Progressing toward goals Pt Will Transfer to Toilet: with supervision;Ambulation;Comfort height toilet;Grab bars;3-in-1 ADL Goal: Toilet Transfer - Progress: Progressing toward goals Miscellaneous OT Goals Miscellaneous OT Goal #2: Pt will be able to state 3/3 back precautions OT Goal: Miscellaneous Goal #2 - Progress: Progressing toward goals  Visit Information  Last OT Received On: 05/12/12 Assistance Needed: +1    Subjective Data      Prior Functioning       Cognition  Overall Cognitive Status: Impaired Area of Impairment: Memory;Problem solving Arousal/Alertness: Awake/alert Orientation Level: Appears intact for tasks assessed Behavior During Session: [facility] for tasks performed Memory: Decreased recall of precautions Memory Deficits: Educated pt on precautions at start of session, but unable to recall precautions at end of session. Safety/Judgement: Decreased awareness of safety precautions Problem Solving: Decreased problem solving when using AE for LB dressing tasks.    Mobility  Shoulder Instructions Bed Mobility Bed Mobility: Not assessed Transfers Transfers: Sit to Stand;Stand to Sit Sit to Stand: 4: Min assist;From chair/3-in-1;With upper extremity assist;With armrests Stand to Sit: 4: Min assist;To chair/3-in-1;With armrests;With upper extremity assist Details for Transfer Assistance: Cueing for safe hand placement       Exercises      Balance     End of Session OT - End of Session Equipment Utilized During Treatment: Back brace Activity Tolerance: Patient tolerated treatment well Patient left: in chair;with call bell/phone within reach Nurse Communication: Mobility status  GO   05/12/2012 Cipriano Mile  OTR/L Pager [phone] Office [phone]   Cipriano Mile 05/12/2012, 1:41 PM

## 2012-05-12 NOTE — Clinical Social Work Note (Signed)
Clinical Social Work  CSW spoke with [facility] and they are able to accept pt at discharge. CSW provided bed offers to pt's son. CSW will continue to follow to facilitate discharge.   Dede Query, MSW, Theresia Majors [phone]

## 2012-05-13 LAB — GLUCOSE, CAPILLARY
Glucose-Capillary: 163 mg/dL — ABNORMAL HIGH (ref 70–99)
Glucose-Capillary: 245 mg/dL — ABNORMAL HIGH (ref 70–99)
Glucose-Capillary: 286 mg/dL — ABNORMAL HIGH (ref 70–99)
Glucose-Capillary: 268 mg/dL — ABNORMAL HIGH (ref 70–99)

## 2012-05-13 MED ORDER — MORPHINE SULFATE ER 15 MG PO TBCR: 15.0000 mg | EXTENDED_RELEASE_TABLET | Freq: Two times a day (BID) | ORAL | Status: DC

## 2012-05-13 MED ORDER — LORAZEPAM 0.5 MG PO TABS: 0.5000 mg | ORAL_TABLET | Freq: Two times a day (BID) | ORAL | Status: DC

## 2012-05-13 MED ORDER — OXYCODONE HCL 5 MG PO TABS: 5.0000 mg | ORAL_TABLET | ORAL | Status: DC | PRN

## 2012-05-13 NOTE — Care Management Note (Signed)
    Page 1 of 1   05/13/2012     1:54:15 PM   CARE MANAGEMENT NOTE 05/13/2012  Patient:  [name], [name]   Account Number:  [id number]  Date Initiated:  05/13/2012  Documentation initiated by:  [name]  Subjective/Objective Assessment:   PT WAS ADMITTED FOR A BACK SURGERY     Action/Plan:   PROGRESSION OF CARE AND DISCHARGE PLANNING   Anticipated DC Date:  05/14/2012   Anticipated DC Plan:  SKILLED NURSING FACILITY  In-house referral  Clinical Social Worker      DC Planning Services  CM consult      Choice offered to / List presented to:             Status of service:  In process, will continue to follow Medicare Important Message given?   (If response is "NO", the following Medicare IM given date fields will be blank) Date Medicare IM given:   Date Additional Medicare IM given:    Discharge Disposition:  SKILLED NURSING FACILITY  Per UR Regulation:  Reviewed for med. necessity/level of care/duration of stay  If discussed at Long Length of Stay Meetings, dates discussed:    Comments:  05/13/12 Tonya Boer, RN, BSN 1353 PT IS TO DC TO [name] TOMORROW

## 2012-05-13 NOTE — Clinical Social Work Note (Signed)
Clinical Social Work  CSW sent discharge information to [facility] and it was received. [facility] is ready to admit pt on Sat, 10/5. Weekend CSW will continue facilitate discharge to [facility] on Sat, 10/5.  Dede Query, MSW, Theresia Majors [phone]

## 2012-05-13 NOTE — Clinical Social Work Note (Signed)
Clinical Social Work  CSW spoke with [facility] regarding a Saturday discharge, if discharge summary and meds are sent to the facility prior. CSW updated pt, husband, and son. MD updated as well of discharge information that is needed for discharge on Friday. CSW will continue to follow.   Dede Query, MSW, Theresia Majors [phone]

## 2012-05-13 NOTE — Progress Notes (Signed)
Filed Vitals:   05/12/12 1904 05/12/12 2230 05/13/12 0225 05/13/12 0528  BP: 143/44 166/77 147/49 139/52  Pulse: 89 108 97 87  Temp: 99.2 F (37.3 C) 100 F (37.8 C) 98.6 F (37 C) 99.6 F (37.6 C)  TempSrc: Oral Oral Oral Oral  Resp: 20 16 20 20   Height:      Weight:      SpO2: 100% 95% 95% 95%    Patient with mild confusion this morning, upon awakening her. We'll continue to progress with PT and OT, and monitor progress. Encouraged to ambulate with staff.  Plan: Continue work with patient's son and social work Haematologist regarding disposition. FL2 signed yesterday.  Hewitt Shorts, MD 05/13/2012, 7:20 AM

## 2012-05-13 NOTE — Discharge Summary (Addendum)
Physician Discharge Summary  Patient ID: YARROW LINHART MRN: [id number] DOB/AGE: July 03, 1933 78 y.o.  Admit date: 05/09/2012 Discharge date: 05/14/2012  Admission Diagnoses:  Lumbar stenosis with neurogenic claudication, lumbar spondylolisthesis, lumbar degenerative disc scoliosis, lumbar spondylosis  Discharge Diagnoses:  Lumbar stenosis with neurogenic claudication, lumbar spondylolisthesis, lumbar degenerative disc scoliosis, lumbar spondylosis  Discharged Condition: good  Hospital Course: Patient was admitted from [facility] skilled nursing facility for definitive lumbar surgery. General and an L3-L5 decompressive lumbar laminectomy, L3-4 and L4-5 posterior lumbar interbody fusion, and a bilateral L3-L5 posterior lateral fusion. Postoperatively she has done well. She had significant relief of her neurogenic claudication, although she did have moderate incisional pain. The wound has been healing nicely. It has been closed with Dermabond, and her family was instructed to remove the Dermabond 2 weeks following surgery.  We've been able to reduce her medications as compared to prior hospitalization. Prior to hospitalization she been taking MS Contin 3 times a day (30, 15, 30), postoperatively her dosage was reduced to 30 mg twice a day, and we have been able to further reduce it to 15 mg twice a day. We did supplement her with OxyIR 5-10 mg every 4 hours when necessary pain. She also been on Ativan 0.5 mg 3 times a day, and we reduced it to twice a day.  She was seen by PT and OT, and has continued to work with them through the hospitalization. She has made significant progress, and is more mobile and ambulatory as compared to prior to hospitalization. The patient notes significant relief of her neurogenic claudication, and it is expected that she'll continue to make progress over the coming weeks.  At her request at the request of her husband, son, and other family members, she is returning to  [facility] skilled nursing facility for further rehabilitation and recovery. She is to return for followup with me in the office in 3-4 weeks, which her son is going to arrange with my Diplomatic Services operational officer. She is going to continue to followup with Dr. Clinton Sawyer at [facility], and Dr. Kirby Funk her primary physician.  From a neurosurgical perspective her narcotic analgesics certainly can be tapered and discontinued it she continues to recover.  Discharge Exam: Blood pressure 139/52, pulse 87, temperature 99.6 F (37.6 C), temperature source Oral, resp. rate 20, height 5\' 3"  (1.6 m), weight 72.258 kg (159 lb 4.8 oz), SpO2 95.00%.  Disposition: [facility].     Medication List     As of 05/13/2012  1:29 PM    TAKE these medications         acetaminophen 325 MG tablet   Commonly known as: TYLENOL   Take 650 mg by mouth every 6 (six) hours as needed. For pain      aspirin EC 81 MG tablet   Take 81 mg by mouth daily.      cholecalciferol 1000 UNITS tablet   Commonly known as: VITAMIN D   Take 1,000 Units by mouth daily.      DULoxetine 30 MG capsule   Commonly known as: CYMBALTA   Take 1 capsule (30 mg total) by mouth daily.      insulin glargine 100 UNIT/ML injection   Commonly known as: LANTUS   Inject 30 Units into the skin at bedtime.      insulin lispro 100 UNIT/ML injection   Commonly known as: HUMALOG   Inject 8 Units into the skin 3 (three) times daily before meals.  lansoprazole 30 MG capsule   Commonly known as: PREVACID   Take 30 mg by mouth daily.      lidocaine 5 %   Commonly known as: LIDODERM   Place 1 patch onto the skin daily. Remove & Discard patch within 12 hours or as directed by MD      LORazepam 0.5 MG tablet   Commonly known as: ATIVAN   Take 0.5 mg by mouth every 8 (eight) hours. For anxiety.      morphine 15 MG 12 hr tablet   Commonly known as: MS CONTIN   Take 1 tablet (15 mg total) by mouth 2 (two) times daily.      morphine 20  MG/ML concentrated solution   Commonly known as: ROXANOL   Take 5 mg by mouth every 4 (four) hours as needed. For pain.      ondansetron 4 MG tablet   Commonly known as: ZOFRAN   Take 4 mg by mouth every 6 (six) hours as needed. For nausea.      oxyCODONE 5 MG immediate release tablet   Commonly known as: Oxy IR/ROXICODONE   Take 1-2 tablets (5-10 mg total) by mouth every 4 (four) hours as needed.      polyethylene glycol packet   Commonly known as: MIRALAX / GLYCOLAX   Take 17 g by mouth every evening.      potassium chloride SA 20 MEQ tablet   Commonly known as: K-DUR,KLOR-CON   Take 20 mEq by mouth 3 (three) times daily.      senna-docusate 8.6-50 MG per tablet   Commonly known as: Senokot-S   Take 2 tablets by mouth at bedtime.         Signed: Hewitt Shorts, MD 05/13/2012, 1:29 PM  And

## 2012-05-13 NOTE — Progress Notes (Signed)
Physical Therapy Treatment Patient Details Name: [name] MRN: [id number] DOB: 06/29/1933 Today's Date: 05/13/2012 Time: 1000-1023 PT Time Calculation (min): 23 min  PT Assessment / Plan / Recommendation Comments on Treatment Session  [name] more limited by pain today and unable to ambulate past 5 ft with c/o pain down both of her legs. RN aware of my concerns. No major progress towards goals today because of pain.     Follow Up Recommendations   (SNF)    Barriers to Discharge        Equipment Recommendations  None recommended by PT    Recommendations for Other Services    Frequency Min 5X/week   Plan Discharge plan remains appropriate;Frequency remains appropriate    Precautions / Restrictions Precautions Precautions: Fall;Back Spinal Brace: Lumbar corset   Pertinent Vitals/Pain Increased back and leg pain today, pt tearful and mobility very limited. RN made aware.     Mobility  Bed Mobility Bed Mobility: Not assessed Transfers Transfers: Sit to Stand;Stand to Sit;Stand Pivot Transfers Sit to Stand: 3: Mod assist;With upper extremity assist;With armrests;From chair/3-in-1 Stand to Sit: To chair/3-in-1;With upper extremity assist;3: Mod assist;With armrests Stand Pivot Transfers: With armrests;3: Mod assist Details for Transfer Assistance: sit<>stand x3 for strengthening and attempts to reposition, pt with poor tolerance for standing needing more facilitation today to get to tall posture and weight over her feet secondary to increased back and leg pain; SPT bed<>3in1 as pt reported needing to urinate pt with very impulsive and quick because of pain needing max verbal cues for safety Ambulation/Gait Ambulation/Gait Assistance: 3: Mod assist Ambulation Distance (Feet): 5 Feet Assistive device: Rolling walker Ambulation/Gait Assistance Details: cues for safety as pt became very impulsive as her pain increased with standing, pt also relatively shaky possibly because of  pain? pt began backing up without warning not knowing where she is going as she was very painful and wanting to sitt, needed modA to maintain upright posture Gait Pattern: Narrow base of support;Trunk flexed;Shuffle;Decreased stride length    Exercises General Exercises - Lower Extremity Toe Raises: AROM;Both;10 reps;Seated Heel Raises: AROM;Both;10 reps;Seated     PT Goals Acute Rehab PT Goals PT Goal: Sit to Stand - Progress: Not progressing PT Goal: Stand to Sit - Progress: Not progressing PT Transfer Goal: Bed to Chair/Chair to Bed - Progress: Not progressing PT Goal: Ambulate - Progress: Not progressing  Visit Information  Last PT Received On: 05/13/12 Assistance Needed: +2    Subjective Data  Subjective: I hurt so bad. My legs hurt!    Cognition  Overall Cognitive Status: Impaired Area of Impairment: Memory;Safety/judgement Arousal/Alertness: Awake/alert Orientation Level: Appears intact for tasks assessed Behavior During Session: Anxious Following Commands: Follows one step commands consistently Safety/Judgement: Impulsive;Decreased safety judgement for tasks assessed    Balance     End of Session PT - End of Session Equipment Utilized During Treatment: Gait belt;Back brace Activity Tolerance: Patient limited by pain;Patient limited by fatigue Patient left: in chair;with call bell/phone within reach Nurse Communication: Mobility status   GP     [facility] [name] 05/13/2012, 10:33 AM

## 2012-05-14 LAB — GLUCOSE, CAPILLARY: Glucose-Capillary: 137 mg/dL — ABNORMAL HIGH (ref 70–99)

## 2012-05-14 NOTE — Progress Notes (Signed)
Patient ID: [name], female   DOB: [birth year], 78 y.o.   MRN: [id number] Patient stable for transfer to nursing facility today

## 2012-05-14 NOTE — Progress Notes (Signed)
Pt for transfer to [facility] today via [facility]. Pt, pt's husband (at bedside), and SNF aware of d/c. D/C packet complete with signed hard Rx, chart copy, and signed FL2. CSW signing off as no other CSW needs identified at this time.  Dellie Burns, MSW, LCSWA [phone] (Weekends 8:00am-4:30pm)

## 2012-05-14 NOTE — Progress Notes (Signed)
Physical Therapy Treatment Patient Details Name: LORELEE [name] MRN: [id number] DOB: [birth year] Today's Date: 05/14/2012 Time: 4098-1191 PT Time Calculation (min): 23 min  PT Assessment / Plan / Recommendation Comments on Treatment Session  Pt progressing well, with increased ambulation this session. Pt still complaining of hip and leg pain. Plan to d/c to SNF this afternoon.     Follow Up Recommendations  Supervision/Assistance - 24 hour     Does the patient have the potential to tolerate intense rehabilitation  No, Recommend SNF  Barriers to Discharge        Equipment Recommendations  None recommended by PT    Recommendations for Other Services    Frequency Min 5X/week   Plan Discharge plan remains appropriate;Frequency remains appropriate    Precautions / Restrictions Precautions Precautions: Fall;Back Precaution Comments: pt able to verbalize 1/3 back precautions. Pt reeducated Spinal Brace: Lumbar corset Restrictions Weight Bearing Restrictions: No   Pertinent Vitals/Pain Pt with 5/10 pain during session.     Mobility  Bed Mobility Bed Mobility: Rolling Right;Rolling Left;Right Sidelying to Sit;Sitting - Scoot to Edge of Bed Rolling Right: 4: Min guard;With rail Rolling Left: 4: Min guard;With rail Right Sidelying to Sit: 4: Min assist;With rails Sitting - Scoot to Edge of Bed: 3: Mod assist Details for Bed Mobility Assistance: VC for sequencing to maintain back precautions. Min assist through trunk into sitting as well as mod assist with weight shifting towards edge of bed. Transfers Transfers: Sit to Stand;Stand to [employer] Transfers Sit to Stand: 4: Min assist;With upper extremity assist;From bed Stand to Sit: 4: Min assist;With upper extremity assist;To chair/3-in-1 Details for Transfer Assistance: Min assist for safety to/from standing. Cueing for safety with locking/unlocking of rollator. Ambulation/Gait Ambulation/Gait Assistance: 4: Min  guard Ambulation Distance (Feet): 75 Feet Assistive device: Rolling walker Ambulation/Gait Assistance Details: VC for upright posture during ambulation and safety due to increased speed. Pt complaining of increased leg pain towards end of ambulation trial.  Gait Pattern: Narrow base of support;Trunk flexed;Shuffle;Decreased stride length Gait velocity: slowed    Exercises     PT Diagnosis:    PT Problem List:   PT Treatment Interventions:     PT Goals Acute Rehab PT Goals PT Goal: Rolling Supine to Right Side - Progress: Progressing toward goal PT Goal: Rolling Supine to Left Side - Progress: Progressing toward goal PT Goal: Supine/Side to Sit - Progress: Progressing toward goal PT Goal: Sit to Supine/Side - Progress: Progressing toward goal PT Goal: Sit to Stand - Progress: Progressing toward goal PT Goal: Stand to Sit - Progress: Progressing toward goal PT Transfer Goal: Bed to Chair/Chair to Bed - Progress: Progressing toward goal PT Goal: Ambulate - Progress: Progressing toward goal Additional Goals PT Goal: Additional Goal #1 - Progress: Progressing toward goal  Visit Information  Last PT Received On: 05/14/12 Assistance Needed: +1    Subjective Data      Cognition  Overall Cognitive Status: Impaired Area of Impairment: Memory;Safety/judgement Arousal/Alertness: Awake/alert Orientation Level: Appears intact for tasks assessed Behavior During Session: Anxious Following Commands: Follows one step commands consistently Safety/Judgement: Impulsive;Decreased safety judgement for tasks assessed    Balance     End of Session PT - End of Session Equipment Utilized During Treatment: Gait belt;Back brace Activity Tolerance: Patient tolerated treatment well Patient left: in chair;with call bell/phone within reach Nurse Communication: Mobility status   GP     Milana Kidney 05/14/2012, 9:55 AM

## 2012-07-05 ENCOUNTER — Emergency Department (HOSPITAL_COMMUNITY): Payer: Medicare Other

## 2012-07-05 ENCOUNTER — Emergency Department (HOSPITAL_COMMUNITY)
Admission: EM | Admit: 2012-07-05 | Discharge: 2012-07-06 | Disposition: A | Discharge status: Home / Self Care | Payer: Medicare Other | Attending: Emergency Medicine | Admitting: Emergency Medicine

## 2012-07-05 ENCOUNTER — Encounter (HOSPITAL_COMMUNITY): Payer: Self-pay

## 2012-07-05 DIAGNOSIS — E119 Type 2 diabetes mellitus without complications: Secondary | ICD-10-CM | POA: Insufficient documentation

## 2012-07-05 DIAGNOSIS — K219 Gastro-esophageal reflux disease without esophagitis: Secondary | ICD-10-CM | POA: Insufficient documentation

## 2012-07-05 DIAGNOSIS — Z8619 Personal history of other infectious and parasitic diseases: Secondary | ICD-10-CM | POA: Insufficient documentation

## 2012-07-05 DIAGNOSIS — Z7982 Long term (current) use of aspirin: Secondary | ICD-10-CM | POA: Insufficient documentation

## 2012-07-05 DIAGNOSIS — M129 Arthropathy, unspecified: Secondary | ICD-10-CM | POA: Insufficient documentation

## 2012-07-05 DIAGNOSIS — M549 Dorsalgia, unspecified: Secondary | ICD-10-CM

## 2012-07-05 DIAGNOSIS — Z87448 Personal history of other diseases of urinary system: Secondary | ICD-10-CM | POA: Insufficient documentation

## 2012-07-05 DIAGNOSIS — W19XXXA Unspecified fall, initial encounter: Secondary | ICD-10-CM | POA: Insufficient documentation

## 2012-07-05 DIAGNOSIS — Z981 Arthrodesis status: Secondary | ICD-10-CM | POA: Insufficient documentation

## 2012-07-05 DIAGNOSIS — Y939 Activity, unspecified: Secondary | ICD-10-CM | POA: Insufficient documentation

## 2012-07-05 DIAGNOSIS — Z79899 Other long term (current) drug therapy: Secondary | ICD-10-CM | POA: Insufficient documentation

## 2012-07-05 DIAGNOSIS — Z794 Long term (current) use of insulin: Secondary | ICD-10-CM | POA: Insufficient documentation

## 2012-07-05 DIAGNOSIS — Y929 Unspecified place or not applicable: Secondary | ICD-10-CM | POA: Insufficient documentation

## 2012-07-05 DIAGNOSIS — M5137 Other intervertebral disc degeneration, lumbosacral region: Secondary | ICD-10-CM | POA: Insufficient documentation

## 2012-07-05 DIAGNOSIS — M51379 Other intervertebral disc degeneration, lumbosacral region without mention of lumbar back pain or lower extremity pain: Secondary | ICD-10-CM | POA: Insufficient documentation

## 2012-07-05 DIAGNOSIS — S32009A Unspecified fracture of unspecified lumbar vertebra, initial encounter for closed fracture: Secondary | ICD-10-CM | POA: Insufficient documentation

## 2012-07-05 DIAGNOSIS — I10 Essential (primary) hypertension: Secondary | ICD-10-CM | POA: Insufficient documentation

## 2012-07-05 DIAGNOSIS — S32000A Wedge compression fracture of unspecified lumbar vertebra, initial encounter for closed fracture: Secondary | ICD-10-CM

## 2012-07-05 LAB — URINALYSIS, ROUTINE W REFLEX MICROSCOPIC
Glucose, UA: NEGATIVE mg/dL
Leukocytes, UA: NEGATIVE
Nitrite: NEGATIVE
Protein, ur: NEGATIVE mg/dL
pH: 8 (ref 5.0–8.0)

## 2012-07-05 MED ORDER — ONDANSETRON 8 MG PO TBDP
8.0000 mg | ORAL_TABLET | Freq: Once | ORAL | Status: AC
  Administered 2012-07-05: 8 mg via ORAL
  Filled 2012-07-05: qty 1

## 2012-07-05 MED ORDER — ONDANSETRON HCL 8 MG PO TABS: 8.0000 mg | ORAL_TABLET | Freq: Three times a day (TID) | ORAL | Status: DC | PRN

## 2012-07-05 MED ORDER — HYDROMORPHONE HCL PF 1 MG/ML IJ SOLN
1.0000 mg | Freq: Once | INTRAMUSCULAR | Status: AC
  Administered 2012-07-05: 1 mg via INTRAMUSCULAR
  Filled 2012-07-05: qty 1

## 2012-07-05 NOTE — ED Notes (Signed)
Dr. Wentz at bedside. 

## 2012-07-05 NOTE — ED Notes (Addendum)
Pt sts increasing weakness, low back pain and nausea since fall x 1 week.  Fall was unwitnessed.  Pt can not give a clear story about fall.  Pt sts legs have been swollen since back surgery x 3 weeks.  Pain score 10/10.  Pt sts abdominal pain and pain with urination, since March.  Pain score 7/10.

## 2012-07-05 NOTE — ED Notes (Signed)
Patient gave water.  Ok to drink by Dr. Effie Shy Patient noticed having difficulty swallowing water. Family stated that she has had a history of swallowing problems

## 2012-07-05 NOTE — ED Notes (Addendum)
Per EMS, Pt, from [facility], had back surgery x 3 weeks.  Sts Pt fell x 1 week ago.  Fall was unwitnessed and Pt can not give clear story about fall.  Pt has not been evaluated since fall.  Per EMS, facility "thought she would get better because she didn't hit her head."  Pt has been having increasing back pain and nausea.  Pain score 10/10.  Pt unable to keep pain medication down.

## 2012-07-05 NOTE — ED Provider Notes (Signed)
History     CSN: [id number]  Arrival date & time 07/05/12  1649   First MD Initiated Contact with Patient 07/05/12 1706      Chief Complaint  Patient presents with  . Back Pain  . Hypertension  . Abdominal Pain    (Consider location/radiation/quality/duration/timing/severity/associated sxs/prior treatment) HPI Comments: [name] is a 78 y.o. Female her for evaluation of back pain. She aggravated her back in a fall one week ago. She's been using her usual medication at the assisted living facility, and not getting better. She had been getting physical therapy, but now has difficulty completing tasks secondary to pain after the fall. She had lumbar fusion 7 weeks ago, and followed up with the surgeon for weeks after. Since the fall, the scheduled followup appointment, was advanced to come in next week. Patient has nausea associated with administration of oxycodone, which she is taking 4 times a day, when necessary pain. Her facility, is premedicated with Zofran, 4 mg,  In advance of the oxycodone treatment. Patient reports dysuria, and urinary frequency. There's been no fever. She denies abdominal pain. Her family members are here with her.  The history is provided by the patient.    Past Medical History  Diagnosis Date  . Incontinence of urine   . Diarrhea   . Neuropathy     bilateral feet  . GERD (gastroesophageal reflux disease)   . Depression   . Urinary, incontinence, stress female   . History of shingles 2009  . Degenerative lumbar spinal stenosis   . Type II diabetes mellitus     Type 2 IDDM  X 40 years;   . Arthritis     lower back/hands    Past Surgical History  Procedure Date  . Incontinence surgery 2005  . Tonsillectomy 1964  . Abdominal hysterectomy     1982  . Ovarian cyst surgery 1972  . Cholecystectomy 1988  . Cystoscopy 2009    macroplastique inj  . Rectocele repair 10/15/2011    Procedure: POSTERIOR REPAIR (RECTOCELE);  Surgeon: Kathi Ludwig, MD;  Location: [facility];  Service: Urology;;  posterior vaginal vault repair with sacral spinus repair with graft  . Cystoscopy 10/15/2011    Procedure: CYSTOSCOPY;  Surgeon: Kathi Ludwig, MD;  Location: [facility];  Service: Urology;  Laterality: N/A;  . Flexible sigmoidoscopy 02/28/2012    Procedure: FLEXIBLE SIGMOIDOSCOPY;  Surgeon: Willis Modena, MD;  Location: [facility];  Service: Endoscopy;  Laterality: N/A;  . Breast biopsy 1990    bilaterally  . Cataract extraction w/ intraocular lens implant   . Posterior fusion lumbar spine 05/09/2012    Family History  Problem Relation Age of Onset  . Osteoarthritis Sister   . Osteoarthritis Brother     History  Substance Use Topics  . Smoking status: Never Smoker   . Smokeless tobacco: Never Used  . Alcohol Use: No    OB History    Grav Para Term Preterm Abortions TAB SAB Ect Mult Living                  Review of Systems  All other systems reviewed and are negative.    Allergies  Ciprofloxacin; Neosporin; and Sulfa antibiotics  Home Medications   Current Outpatient Rx  Name  Route  Sig  Dispense  Refill  . ACETAMINOPHEN 325 MG PO TABS   Oral   Take 650 mg by mouth every 6 (six) hours as needed. For  pain         . ASPIRIN EC 81 MG PO TBEC   Oral   Take 81 mg by mouth daily.         Marland Kitchen VITAMIN D 1000 UNITS PO TABS   Oral   Take 1,000 Units by mouth daily.         . DULOXETINE HCL 30 MG PO CPEP   Oral   Take 1 capsule (30 mg total) by mouth daily.   30 capsule   0   . INSULIN ASPART 100 UNIT/ML [place] SOLN   Subcutaneous   Inject 8 Units into the skin 3 (three) times daily before meals.         . INSULIN GLARGINE 100 UNIT/ML [place] SOLN   Subcutaneous   Inject 30 Units into the skin at bedtime.         Marland Kitchen LANSOPRAZOLE 30 MG PO CPDR   Oral   Take 30 mg by mouth daily.         Marland Kitchen LIDOCAINE 5 % EX PTCH   Transdermal   Place 1 patch onto the skin  daily. Remove & Discard patch within 12 hours or as directed by MD         . LORAZEPAM 0.5 MG PO TABS   Oral   Take 1 tablet (0.5 mg total) by mouth every 12 (twelve) hours. For anxiety.   10 tablet   0   . MORPHINE SULFATE ER 15 MG PO TBCR   Oral   Take 1 tablet (15 mg total) by mouth 2 (two) times daily.   10 tablet   0   . OXYCODONE HCL 5 MG PO TABS   Oral   Take 1-2 tablets (5-10 mg total) by mouth every 4 (four) hours as needed.   20 tablet   0   . POLYETHYLENE GLYCOL 3350 PO PACK   Oral   Take 17 g by mouth every evening.          Marland Kitchen POTASSIUM CHLORIDE CRYS ER 20 MEQ PO TBCR   Oral   Take 20 mEq by mouth 3 (three) times daily.         Bernadette Hoit SODIUM 8.6-50 MG PO TABS   Oral   Take 2 tablets by mouth at bedtime.   60 tablet   0   . ONDANSETRON HCL 8 MG PO TABS   Oral   Take 1 tablet (8 mg total) by mouth every 8 (eight) hours as needed for nausea.   20 tablet   0     BP 169/80  Pulse 117  Temp 99.4 F (37.4 C) (Oral)  Resp 20  SpO2 96%  Physical Exam  Nursing note and vitals reviewed. Constitutional: She is oriented to person, place, and time. She appears well-developed.       Elderly, frail  HENT:  Head: Normocephalic and atraumatic.  Eyes: Conjunctivae normal and EOM are normal. Pupils are equal, round, and reactive to light.  Neck: Normal range of motion and phonation normal. Neck supple.  Cardiovascular: Normal rate, regular rhythm and intact distal pulses.   Pulmonary/Chest: Effort normal and breath sounds normal. She exhibits no tenderness.  Abdominal: Soft. She exhibits no distension and no mass. There is no tenderness. There is no guarding.  Musculoskeletal: Normal range of motion.       She resists voluntary motion of the legs, secondary to pain. However, when I lift her leg is at 45, bilaterally. She can maintain  that position, and control the desent to the stretcher.  Neurological: She is alert and oriented to person,  place, and time. She has normal strength. She exhibits normal muscle tone.  Skin: Skin is warm and dry.  Psychiatric: She has a normal mood and affect. Her behavior is normal. Judgment and thought content normal.    ED Course  Procedures (including critical care time)   ED treatment: IM Dilaudid, and oral Zofran  Patient reports improvement of pain after treatment.  18:10- a new family members in the room, her son. He is very concerned that she might have had a new injury after her recent fall. He would like to proceed with imaging. Plain films were ordered.   Plain film findings were discussed with, and reviewed with Dr. Venetia Maxon, on-call neurosurgery. He recommends continued symptomatic care with special care of using lumbar corset whenever she gets up.  Findings discussed with the patient's PCP regarding her current pain medicines. He recommends continuing on the same treatment program. He will see her in followup next week, as scheduled  Nursing notes, applicable records and vitals reviewed.  Radiologic Images/Reports reviewed.   Labs Reviewed  URINALYSIS, ROUTINE W REFLEX MICROSCOPIC - Abnormal; Notable for the following:    APPearance CLOUDY (*)     Ketones, ur TRACE (*)     All other components within normal limits  URINE CULTURE   Dg Lumbar Spine Complete  07/05/2012  *RADIOLOGY REPORT*  Clinical Data: Fall, low back pain.  LUMBAR SPINE - COMPLETE 4+ VIEW  Comparison: 06/07/2012  Findings: Stable levoscoliosis.  Postoperative changes in the lower lumbar spine with fusion from L3-L5.  There is moderate compression fracture at the L1 vertebral body. This is new since prior study.  Stable mild compression through the superior endplate of T12.  IMPRESSION: New moderate compression fracture of L1.  Stable mild compression through the superior endplate of T12.  Stable postoperative changes.   Original Report Authenticated By: Charlett Nose, M.D.      1. Back pain   2. Lumbar  compression fracture       MDM  L1 lumbar compression fracture, no significant neuropathy. Doubt retropulsion of fracture fragments. No evidence for compressive myelopathy.Doubt metabolic instability, serious bacterial infection or impending vascular collapse; the patient is stable for discharge.    Plan: Home Medications- usual, increase Zofran to 8 mg; Home Treatments- corset; Recommended follow up- PCP and NS next week as scheduled      Flint Melter, MD 07/05/12 2340

## 2012-07-05 NOTE — ED Notes (Signed)
[id number]<AV> Expected date:07/05/12<BR> Expected time: 4:34 PM<BR> Means of arrival:Ambulance<BR> Comments:<BR> Elderly/back pain/HTN

## 2012-07-05 NOTE — Progress Notes (Signed)
CSW met with pt and pt son at bedside. Pt and pt son confirmed that patient is a resident at [facility] and wish to return when medically stable. Pt son stated that if patient were to be admitted and need rehab than skilled nursing would be considered but not at this time.   Catha Gosselin, LCSWA  [phone] .07/05/2012 1008pm

## 2012-07-06 LAB — URINE CULTURE: Colony Count: NO GROWTH

## 2012-07-06 NOTE — ED Notes (Signed)
Patient is alert and oriented x3.  She was given DC instructions with her son and follow up visit instructions.  Son gave verbal understanding. She was DC via [place] to [facility] with son.  V/S stable.  He was not showing any signs of distress on DC

## 2012-09-20 ENCOUNTER — Other Ambulatory Visit (HOSPITAL_COMMUNITY): Payer: Self-pay | Admitting: *Deleted

## 2012-09-20 ENCOUNTER — Other Ambulatory Visit (HOSPITAL_COMMUNITY): Payer: Self-pay

## 2012-09-21 ENCOUNTER — Encounter (HOSPITAL_COMMUNITY)
Admission: RE | Admit: 2012-09-21 | Discharge: 2012-09-21 | Disposition: A | Discharge status: Home / Self Care | Payer: Medicare Other | Source: Ambulatory Visit | Attending: Internal Medicine | Admitting: Internal Medicine

## 2012-09-21 DIAGNOSIS — M810 Age-related osteoporosis without current pathological fracture: Secondary | ICD-10-CM | POA: Insufficient documentation

## 2012-09-21 MED ORDER — ZOLEDRONIC ACID 5 MG/100ML IV SOLN
5.0000 mg | Freq: Once | INTRAVENOUS | Status: AC
  Administered 2012-09-21: 5 mg via INTRAVENOUS

## 2012-12-03 ENCOUNTER — Emergency Department (HOSPITAL_COMMUNITY): Payer: Medicare Other

## 2012-12-03 ENCOUNTER — Encounter (HOSPITAL_COMMUNITY): Payer: Self-pay | Admitting: *Deleted

## 2012-12-03 ENCOUNTER — Emergency Department (HOSPITAL_COMMUNITY)
Admission: EM | Admit: 2012-12-03 | Discharge: 2012-12-03 | Disposition: A | Discharge status: Skilled Nursing Facility | Payer: Medicare Other | Attending: Emergency Medicine | Admitting: Emergency Medicine

## 2012-12-03 DIAGNOSIS — W010XXA Fall on same level from slipping, tripping and stumbling without subsequent striking against object, initial encounter: Secondary | ICD-10-CM | POA: Insufficient documentation

## 2012-12-03 DIAGNOSIS — S79919A Unspecified injury of unspecified hip, initial encounter: Secondary | ICD-10-CM | POA: Insufficient documentation

## 2012-12-03 DIAGNOSIS — Z8619 Personal history of other infectious and parasitic diseases: Secondary | ICD-10-CM | POA: Insufficient documentation

## 2012-12-03 DIAGNOSIS — W1809XA Striking against other object with subsequent fall, initial encounter: Secondary | ICD-10-CM | POA: Insufficient documentation

## 2012-12-03 DIAGNOSIS — G8929 Other chronic pain: Secondary | ICD-10-CM

## 2012-12-03 DIAGNOSIS — Y9389 Activity, other specified: Secondary | ICD-10-CM | POA: Insufficient documentation

## 2012-12-03 DIAGNOSIS — E119 Type 2 diabetes mellitus without complications: Secondary | ICD-10-CM | POA: Insufficient documentation

## 2012-12-03 DIAGNOSIS — Y9289 Other specified places as the place of occurrence of the external cause: Secondary | ICD-10-CM | POA: Insufficient documentation

## 2012-12-03 DIAGNOSIS — F3289 Other specified depressive episodes: Secondary | ICD-10-CM | POA: Insufficient documentation

## 2012-12-03 DIAGNOSIS — Z79899 Other long term (current) drug therapy: Secondary | ICD-10-CM | POA: Insufficient documentation

## 2012-12-03 DIAGNOSIS — Z7982 Long term (current) use of aspirin: Secondary | ICD-10-CM | POA: Insufficient documentation

## 2012-12-03 DIAGNOSIS — F329 Major depressive disorder, single episode, unspecified: Secondary | ICD-10-CM | POA: Insufficient documentation

## 2012-12-03 DIAGNOSIS — Z8669 Personal history of other diseases of the nervous system and sense organs: Secondary | ICD-10-CM | POA: Insufficient documentation

## 2012-12-03 DIAGNOSIS — K219 Gastro-esophageal reflux disease without esophagitis: Secondary | ICD-10-CM | POA: Insufficient documentation

## 2012-12-03 DIAGNOSIS — Z794 Long term (current) use of insulin: Secondary | ICD-10-CM | POA: Insufficient documentation

## 2012-12-03 DIAGNOSIS — S0990XA Unspecified injury of head, initial encounter: Secondary | ICD-10-CM | POA: Insufficient documentation

## 2012-12-03 DIAGNOSIS — Z8739 Personal history of other diseases of the musculoskeletal system and connective tissue: Secondary | ICD-10-CM | POA: Insufficient documentation

## 2012-12-03 NOTE — ED Notes (Signed)
Pt to ER via EMS from [facility]; EMS reports that pt was in the bathroom this am and tripped and bumped head on the wall; no LOC; pt denies neck or back pain; no obvious injury noted.

## 2012-12-03 NOTE — ED Provider Notes (Signed)
History     CSN: [id number]  Arrival date & time 12/03/12  0224   First MD Initiated Contact with Patient 12/03/12 0305      Chief Complaint  Patient presents with  . Fall   HPI [name] is a 79 y.o. female history of arthritis currently taking MS Contin for pain, presents with mechanical fall. She got up during the night to urinate, tripped and fell toward the right heating the top of her head on a wall.  Patient denies loss of consciousness, she has some mild pain on top of her head at this time, there's been no bleeding, no neck pain, no numbness, no tingling, no chest pain, no antecedent dizziness, chest pain or shortness of breath. Patient has no complaints at this time.    Past Medical History  Diagnosis Date  . Incontinence of urine   . Diarrhea   . Neuropathy     bilateral feet  . GERD (gastroesophageal reflux disease)   . Depression   . Urinary, incontinence, stress female   . History of shingles 2009  . Degenerative lumbar spinal stenosis   . Type II diabetes mellitus     Type 2 IDDM  X 40 years;   . Arthritis     lower back/hands    Past Surgical History  Procedure Laterality Date  . Incontinence surgery  2005  . Tonsillectomy  1964  . Abdominal hysterectomy      1982  . Ovarian cyst surgery  1972  . Cholecystectomy  1988  . Cystoscopy  2009    macroplastique inj  . Rectocele repair  10/15/2011    Procedure: POSTERIOR REPAIR (RECTOCELE);  Surgeon: Kathi Ludwig, MD;  Location: [facility];  Service: Urology;;  posterior vaginal vault repair with sacral spinus repair with graft  . Cystoscopy  10/15/2011    Procedure: CYSTOSCOPY;  Surgeon: Kathi Ludwig, MD;  Location: [facility];  Service: Urology;  Laterality: N/A;  . Flexible sigmoidoscopy  02/28/2012    Procedure: FLEXIBLE SIGMOIDOSCOPY;  Surgeon: Willis Modena, MD;  Location: [facility];  Service: Endoscopy;  Laterality: N/A;  . Breast biopsy  1990     bilaterally  . Cataract extraction w/ intraocular lens implant    . Posterior fusion lumbar spine  05/09/2012    Family History  Problem Relation Age of Onset  . Osteoarthritis Sister   . Osteoarthritis Brother     History  Substance Use Topics  . Smoking status: Never Smoker   . Smokeless tobacco: Never Used  . Alcohol Use: No    OB History   Grav Para Term Preterm Abortions TAB SAB Ect Mult Living                  Review of Systems At least 10pt or greater review of systems completed and are negative except where specified in the HPI.   Allergies  Ciprofloxacin; Neosporin; and Sulfa antibiotics  Home Medications   Current Outpatient Rx  Name  Route  Sig  Dispense  Refill  . amiloride-hydrochlorothiazide (MODURETIC) 5-50 MG tablet   Oral   Take 0.5 tablets by mouth daily.         Marland Kitchen aspirin EC 81 MG tablet   Oral   Take 81 mg by mouth daily.         . calcium citrate-vitamin D (CITRACAL+D) 315-200 MG-UNIT per tablet   Oral   Take 2 tablets by mouth daily.         Marland Kitchen  cholecalciferol (VITAMIN D) 1000 UNITS tablet   Oral   Take 1,000 Units by mouth daily.         . cholecalciferol (VITAMIN D) 1000 UNITS tablet   Oral   Take 1,000 Units by mouth daily.         Marland Kitchen conjugated estrogens (PREMARIN) vaginal cream   Vaginal   Place 1 g vaginally 2 (two) times a week. Monday & Thursday         . DULoxetine (CYMBALTA) 30 MG capsule   Oral   Take 1 capsule (30 mg total) by mouth daily.   30 capsule   0   . fexofenadine (ALLEGRA) 180 MG tablet   Oral   Take 180 mg by mouth daily.         . insulin aspart (NOVOLOG) 100 UNIT/ML injection   Subcutaneous   Inject 8 Units into the skin 3 (three) times daily before meals.         . insulin glargine (LANTUS) 100 UNIT/ML injection   Subcutaneous   Inject 30 Units into the skin at bedtime.         . iron polysaccharides (NIFEREX) 150 MG capsule   Oral   Take 150 mg by mouth 2 (two) times daily.          Marland Kitchen lidocaine (LIDODERM) 5 %   Transdermal   Place 1 patch onto the skin daily. Remove & Discard patch within 12 hours or as directed by MD         . LORazepam (ATIVAN) 0.5 MG tablet   Oral   Take 1 tablet (0.5 mg total) by mouth every 12 (twelve) hours. For anxiety.   10 tablet   0   . morphine (MS CONTIN) 30 MG 12 hr tablet   Oral   Take 30 mg by mouth 2 (two) times daily.         Marland Kitchen olopatadine (PATANOL) 0.1 % ophthalmic solution   Both Eyes   Place 1 drop into both eyes 2 (two) times daily.         Marland Kitchen omeprazole (PRILOSEC) 20 MG capsule   Oral   Take 20 mg by mouth daily.         Marland Kitchen oxybutynin (DITROPAN) 5 MG tablet   Oral   Take 5 mg by mouth 2 (two) times daily.         Marland Kitchen oxyCODONE (OXY IR/ROXICODONE) 5 MG immediate release tablet   Oral   Take 5 mg by mouth every 4 (four) hours as needed for pain.         . polyethylene glycol (MIRALAX / GLYCOLAX) packet   Oral   Take 17 g by mouth every evening.          . potassium chloride SA (K-DUR,KLOR-CON) 20 MEQ tablet   Oral   Take 20 mEq by mouth 3 (three) times daily.         . promethazine (PHENERGAN) 12.5 MG tablet   Oral   Take 12.5 mg by mouth every 6 (six) hours as needed for nausea.         Marland Kitchen senna-docusate (SENOKOT-S) 8.6-50 MG per tablet   Oral   Take 2 tablets by mouth at bedtime.   60 tablet   0   . acetaminophen (TYLENOL) 325 MG tablet   Oral   Take 650 mg by mouth every 6 (six) hours as needed. For pain           BP  152/77  Pulse 91  Temp(Src) 98.5 F (36.9 C) (Oral)  Resp 18  SpO2 95%  Physical Exam  PHYSICAL EXAM: VITAL SIGNS:  . Filed Vitals:   12/03/12 0226 12/03/12 0459  BP: 152/77 144/75  Pulse: 91 91  Temp: 98.5 F (36.9 C)   TempSrc: Oral   Resp: 18 18  SpO2: 95% 98%   CONSTITUTIONAL: Awake, oriented, appears non-toxic HENT: Atraumatic, normocephalic, oral mucosa pink and moist, airway patent. Nares patent without drainage. External ears  normal. EYES: Conjunctiva clear, EOMI, PERRLA NECK: Trachea midline, non-tender, supple CARDIOVASCULAR: Normal heart rate, Normal rhythm, No murmurs, rubs, gallops PULMONARY/CHEST: Clear to auscultation, no rhonchi, wheezes, or rales. Symmetrical breath sounds. CHEST WALL: No lesions. Non-tender. ABDOMINAL: Non-distended, soft, non-tender - no rebound or guarding.  BS normal. NEUROLOGIC: ZO:XWRUEA fields intact. PERRLA, EOMI.  Facial sensation equal to light touch bilaterally.  Good muscle bulk in the masseter muscle and good lateral movement of the jaw.  Facial expressions equal and good strength with smile/frown and puffed cheeks.  Hearing grossly intact to finger rub test.  Uvula, tongue are midline with no deviation. Symmetrical palate elevation.  Trapezius and SCM muscles are 5/5 strength bilaterally.   DTR: Brachioradialis, biceps, patellar, Achilles tendon reflexes 2+ bilaterally.  No clonus. Strength: 5/5 strength flexors and extensors in the upper and lower extremities.  Grip strength, finger adduction/abduction 5/5. Sensation: Sensation intact distally to light touch EXTREMITIES: No clubbing, cyanosis, or edema. Mild tenderness bilaterally with greater trochanters are palpated SKIN: Warm, Dry, No erythema, No rash   ED Course  Procedures (including critical care time)  Labs Reviewed - No data to display Dg Pelvis 1-2 Views  12/03/2012  *RADIOLOGY REPORT*  Clinical Data: Bilateral hip pain after fall.  PELVIS - 1-2 VIEW  Comparison: Lumbar spine 10/21/2012.  Findings: Postoperative changes and scoliosis of the lower lumbar spine.  Degenerative changes in both hips with narrowing sclerosis of the superior acetabular joint and subcortical cysts on the femoral heads.  No evidence of acute fracture or subluxation of the pelvis or hips.  SI joints, pelvic rim, and symphysis pubis appear intact.  No focal bone lesion or bone destruction.  Bone cortex and trabecular architecture appear intact.   Vascular calcifications.  IMPRESSION: Degenerative changes in the hips.  No acute displaced fractures identified in the pelvis.   Original Report Authenticated By: Burman Nieves, M.D.    Ct Head Wo Contrast  12/03/2012  *RADIOLOGY REPORT*  Clinical Data: The patient fell with contact of head against the wall.  No loss of consciousness.  CT HEAD WITHOUT CONTRAST  Technique:  Contiguous axial images were obtained from the base of the skull through the vertex without contrast.  Comparison: 01/30/2012  Findings: The diffuse cerebral atrophy.  Mild ventricular dilatation consistent with central atrophy.  Patchy areas of low attenuation change in the deep white matter consistent with small vessel ischemia.  No mass effect or midline shift.  No abnormal extra-axial fluid collections.  Gray-white matter junctions are distinct.  Basal cisterns are not effaced.  No evidence of acute intracranial hemorrhage.  There is mucosal thickening in the paranasal sinuses.  No acute air-fluid levels.  Mastoid air cells are not opacified.  Vascular calcifications.  No depressed skull fractures.  IMPRESSION: No acute intracranial abnormalities.  Chronic-appearing atrophy and small vessel ischemic changes.   Original Report Authenticated By: Burman Nieves, M.D.      1. Fall against object, initial encounter   2. Hip pain, chronic, left  3. Hip pain, chronic, right       MDM  Mechanical fall, patient on multiple pain medicines most recently and of note MS Contin. I have let patient know, son know, husband know that this medicines will increase her risk of falling.  CT head is unremarkable, x-ray of the pelvis is unremarkable as well. She has no other injuries. No other complaints. This sounds like a mechanical fall, no injuries noted. Patient be discharged back to [facility] in good condition.        Jones Skene, MD 12/03/12 [phone]

## 2012-12-03 NOTE — ED Notes (Signed)
Bed:WA11<BR> Expected date:<BR> Expected time:<BR> Means of arrival:<BR> Comments:<BR> EMS

## 2012-12-20 ENCOUNTER — Emergency Department (HOSPITAL_COMMUNITY): Payer: Medicare Other

## 2012-12-20 ENCOUNTER — Emergency Department (HOSPITAL_COMMUNITY)
Admission: EM | Admit: 2012-12-20 | Discharge: 2012-12-20 | Disposition: A | Discharge status: Skilled Nursing Facility | Payer: Medicare Other | Attending: Emergency Medicine | Admitting: Emergency Medicine

## 2012-12-20 ENCOUNTER — Encounter (HOSPITAL_COMMUNITY): Payer: Self-pay | Admitting: Emergency Medicine

## 2012-12-20 DIAGNOSIS — M51379 Other intervertebral disc degeneration, lumbosacral region without mention of lumbar back pain or lower extremity pain: Secondary | ICD-10-CM | POA: Insufficient documentation

## 2012-12-20 DIAGNOSIS — F3289 Other specified depressive episodes: Secondary | ICD-10-CM | POA: Insufficient documentation

## 2012-12-20 DIAGNOSIS — Z79899 Other long term (current) drug therapy: Secondary | ICD-10-CM | POA: Insufficient documentation

## 2012-12-20 DIAGNOSIS — K5900 Constipation, unspecified: Secondary | ICD-10-CM | POA: Insufficient documentation

## 2012-12-20 DIAGNOSIS — E1149 Type 2 diabetes mellitus with other diabetic neurological complication: Secondary | ICD-10-CM | POA: Insufficient documentation

## 2012-12-20 DIAGNOSIS — K219 Gastro-esophageal reflux disease without esophagitis: Secondary | ICD-10-CM | POA: Insufficient documentation

## 2012-12-20 DIAGNOSIS — Z794 Long term (current) use of insulin: Secondary | ICD-10-CM | POA: Insufficient documentation

## 2012-12-20 DIAGNOSIS — R110 Nausea: Secondary | ICD-10-CM

## 2012-12-20 DIAGNOSIS — M159 Polyosteoarthritis, unspecified: Secondary | ICD-10-CM | POA: Insufficient documentation

## 2012-12-20 DIAGNOSIS — Z872 Personal history of diseases of the skin and subcutaneous tissue: Secondary | ICD-10-CM | POA: Insufficient documentation

## 2012-12-20 DIAGNOSIS — R112 Nausea with vomiting, unspecified: Secondary | ICD-10-CM | POA: Insufficient documentation

## 2012-12-20 DIAGNOSIS — M5137 Other intervertebral disc degeneration, lumbosacral region: Secondary | ICD-10-CM | POA: Insufficient documentation

## 2012-12-20 DIAGNOSIS — Z87448 Personal history of other diseases of urinary system: Secondary | ICD-10-CM | POA: Insufficient documentation

## 2012-12-20 DIAGNOSIS — F329 Major depressive disorder, single episode, unspecified: Secondary | ICD-10-CM | POA: Insufficient documentation

## 2012-12-20 DIAGNOSIS — Z8719 Personal history of other diseases of the digestive system: Secondary | ICD-10-CM | POA: Insufficient documentation

## 2012-12-20 DIAGNOSIS — Z7982 Long term (current) use of aspirin: Secondary | ICD-10-CM | POA: Insufficient documentation

## 2012-12-20 DIAGNOSIS — E1142 Type 2 diabetes mellitus with diabetic polyneuropathy: Secondary | ICD-10-CM | POA: Insufficient documentation

## 2012-12-20 LAB — CBC WITH DIFFERENTIAL/PLATELET
Basophils Absolute: 0 10*3/uL (ref 0.0–0.1)
Eosinophils Relative: 2 % (ref 0–5)
HCT: 41 % (ref 36.0–46.0)
Hemoglobin: 12.7 g/dL (ref 12.0–15.0)
Lymphocytes Relative: 25 % (ref 12–46)
Lymphs Abs: 1.7 10*3/uL (ref 0.7–4.0)
MCV: 80.7 fL (ref 78.0–100.0)
Monocytes Absolute: 0.4 10*3/uL (ref 0.1–1.0)
Monocytes Relative: 7 % (ref 3–12)
Neutro Abs: 4.4 10*3/uL (ref 1.7–7.7)
RBC: 5.08 MIL/uL (ref 3.87–5.11)
RDW: 15.8 % — ABNORMAL HIGH (ref 11.5–15.5)
WBC: 6.7 10*3/uL (ref 4.0–10.5)

## 2012-12-20 LAB — URINALYSIS, ROUTINE W REFLEX MICROSCOPIC
Glucose, UA: NEGATIVE mg/dL
Hgb urine dipstick: NEGATIVE
Protein, ur: NEGATIVE mg/dL
pH: 7.5 (ref 5.0–8.0)

## 2012-12-20 LAB — COMPREHENSIVE METABOLIC PANEL
ALT: 16 U/L (ref 0–35)
AST: 28 U/L (ref 0–37)
Albumin: 3.6 g/dL (ref 3.5–5.2)
CO2: 30 mEq/L (ref 19–32)
Calcium: 9.5 mg/dL (ref 8.4–10.5)
Chloride: 100 mEq/L (ref 96–112)
Creatinine, Ser: 0.65 mg/dL (ref 0.50–1.10)
GFR calc non Af Amer: 83 mL/min — ABNORMAL LOW (ref >90)
Sodium: 139 mEq/L (ref 135–145)

## 2012-12-20 LAB — GLUCOSE, CAPILLARY: Glucose-Capillary: 68 mg/dL — ABNORMAL LOW (ref 70–99)

## 2012-12-20 LAB — URINE MICROSCOPIC-ADD ON

## 2012-12-20 MED ORDER — ONDANSETRON 8 MG PO TBDP: 8.0000 mg | ORAL_TABLET | Freq: Three times a day (TID) | ORAL | Status: DC | PRN

## 2012-12-20 MED ORDER — IOHEXOL 300 MG/ML  SOLN: 50.0000 mL | Freq: Once | INTRAMUSCULAR | Status: DC | PRN

## 2012-12-20 MED ORDER — GLUCOSE 40 % PO GEL
1.0000 | Freq: Once | ORAL | Status: AC
  Administered 2012-12-20: 37.5 g via ORAL
  Filled 2012-12-20: qty 1

## 2012-12-20 MED ORDER — SODIUM CHLORIDE 0.9 % IV SOLN
1000.0000 mL | Freq: Once | INTRAVENOUS | Status: AC
  Administered 2012-12-20: 1000 mL via INTRAVENOUS

## 2012-12-20 MED ORDER — HYDROMORPHONE HCL PF 1 MG/ML IJ SOLN
0.5000 mg | Freq: Once | INTRAMUSCULAR | Status: AC
  Administered 2012-12-20: 0.5 mg via INTRAVENOUS
  Filled 2012-12-20: qty 1

## 2012-12-20 MED ORDER — DOCUSATE SODIUM 100 MG PO CAPS: 100.0000 mg | ORAL_CAPSULE | Freq: Two times a day (BID) | ORAL | Status: DC

## 2012-12-20 MED ORDER — IOHEXOL 300 MG/ML  SOLN: 100.0000 mL | Freq: Once | INTRAMUSCULAR | Status: DC | PRN

## 2012-12-20 MED ORDER — ONDANSETRON HCL 4 MG/2ML IJ SOLN
4.0000 mg | Freq: Once | INTRAMUSCULAR | Status: AC
  Administered 2012-12-20: 4 mg via INTRAVENOUS
  Filled 2012-12-20: qty 2

## 2012-12-20 MED ORDER — SODIUM CHLORIDE 0.9 % IV SOLN
1000.0000 mL | INTRAVENOUS | Status: DC
  Administered 2012-12-20: 1000 mL via INTRAVENOUS

## 2012-12-20 NOTE — ED Provider Notes (Signed)
History    CSN: [id number] Arrival date & time 12/20/12  1318 First MD Initiated Contact with Patient 12/20/12 1522      Chief Complaint  Patient presents with  . Weakness  . Emesis    HPI Pt is a resident of [facility].  Pt states she has been feeling nauseated today and has been vomiting.  She is not sure when it started but thinks it started today.  She feels like she has been vomiting all day, several times.  Staff at the facility noted that she had been complaining of feeling weak today.  Husband who is with her, said that she started to vomit when trying to take her medications.  No falls.   No chest pain although she does have pain in the top of her stomach towards her chest.  That pain is aching.  When she tries to eat she gets more naseated but the pain does not increase.  Some dysuria.  She also has pain in her right arm towards her shoulder.   No diarrhea.  No prior abdominal surgeries.  No recent falls.  Husband found her with her head down over the sink today.  Past Medical History  Diagnosis Date  . Incontinence of urine   . Diarrhea   . Neuropathy     bilateral feet  . GERD (gastroesophageal reflux disease)   . Depression   . Urinary, incontinence, stress female   . History of shingles 2009  . Degenerative lumbar spinal stenosis   . Type II diabetes mellitus     Type 2 IDDM  X 40 years;   . Arthritis     lower back/hands    Past Surgical History  Procedure Laterality Date  . Incontinence surgery  2005  . Tonsillectomy  1964  . Abdominal hysterectomy      1982  . Ovarian cyst surgery  1972  . Cholecystectomy  1988  . Cystoscopy  2009    macroplastique inj  . Rectocele repair  10/15/2011    Procedure: POSTERIOR REPAIR (RECTOCELE);  Surgeon: Kathi Ludwig, MD;  Location: [facility];  Service: Urology;;  posterior vaginal vault repair with sacral spinus repair with graft  . Cystoscopy  10/15/2011    Procedure: CYSTOSCOPY;  Surgeon:  Kathi Ludwig, MD;  Location: [facility];  Service: Urology;  Laterality: N/A;  . Flexible sigmoidoscopy  02/28/2012    Procedure: FLEXIBLE SIGMOIDOSCOPY;  Surgeon: Willis Modena, MD;  Location: [facility];  Service: Endoscopy;  Laterality: N/A;  . Breast biopsy  1990    bilaterally  . Cataract extraction w/ intraocular lens implant    . Posterior fusion lumbar spine  05/09/2012    Family History  Problem Relation Age of Onset  . Osteoarthritis Sister   . Osteoarthritis Brother     History  Substance Use Topics  . Smoking status: Never Smoker   . Smokeless tobacco: Never Used  . Alcohol Use: No    OB History   Grav Para Term Preterm Abortions TAB SAB Ect Mult Living                  Review of Systems  Constitutional: Negative for fever.  Respiratory: Negative for shortness of breath.   Neurological: Negative for speech difficulty and headaches.  All other systems reviewed and are negative.    Allergies  Ciprofloxacin; Neosporin; and Sulfa antibiotics  Home Medications   Current Outpatient Rx  Name  Route  Sig  Dispense  Refill  . acetaminophen (TYLENOL) 325 MG tablet   Oral   Take 650 mg by mouth every 6 (six) hours as needed. For pain         . amiloride-hydrochlorothiazide (MODURETIC) 5-50 MG tablet   Oral   Take 0.5 tablets by mouth daily.         Marland Kitchen aspirin EC 81 MG tablet   Oral   Take 81 mg by mouth daily.         . calcium citrate-vitamin D (CITRACAL+D) 315-200 MG-UNIT per tablet   Oral   Take 2 tablets by mouth daily.         . cholecalciferol (VITAMIN D) 1000 UNITS tablet   Oral   Take 1,000 Units by mouth daily.         Marland Kitchen conjugated estrogens (PREMARIN) vaginal cream   Vaginal   Place 1 g vaginally 2 (two) times a week. Tuesdays and Fridays.         . DULoxetine (CYMBALTA) 30 MG capsule   Oral   Take 1 capsule (30 mg total) by mouth daily.   30 capsule   0   . fexofenadine (ALLEGRA) 180 MG tablet    Oral   Take 180 mg by mouth daily.         . insulin aspart (NOVOLOG) 100 UNIT/ML injection   Subcutaneous   Inject 8 Units into the skin 3 (three) times daily before meals.         . insulin glargine (LANTUS) 100 UNIT/ML injection   Subcutaneous   Inject 30 Units into the skin at bedtime.         . iron polysaccharides (NIFEREX) 150 MG capsule   Oral   Take 150 mg by mouth 2 (two) times daily.         Marland Kitchen lidocaine (LIDODERM) 5 %   Transdermal   Place 1 patch onto the skin daily. Remove & Discard patch within 12 hours or as directed by MD         . LORazepam (ATIVAN) 0.5 MG tablet   Oral   Take 1 tablet (0.5 mg total) by mouth every 12 (twelve) hours. For anxiety.   10 tablet   0   . morphine (MS CONTIN) 30 MG 12 hr tablet   Oral   Take 30 mg by mouth 2 (two) times daily.         . Nutritional Supplements (NUTRITIONAL DRINK PO)   Oral   Take 237 mLs by mouth 3 (three) times daily.         Marland Kitchen olopatadine (PATANOL) 0.1 % ophthalmic solution   Both Eyes   Place 1 drop into both eyes 2 (two) times daily as needed for allergies.          Marland Kitchen omeprazole (PRILOSEC) 20 MG capsule   Oral   Take 20 mg by mouth daily.         Marland Kitchen oxybutynin (DITROPAN) 5 MG tablet   Oral   Take 5 mg by mouth 2 (two) times daily.         Marland Kitchen oxyCODONE (OXY IR/ROXICODONE) 5 MG immediate release tablet   Oral   Take 5 mg by mouth every 4 (four) hours as needed for pain.         . polyethylene glycol (MIRALAX / GLYCOLAX) packet   Oral   Take 17 g by mouth daily as needed (for constipation).          Marland Kitchen  potassium chloride SA (K-DUR,KLOR-CON) 20 MEQ tablet   Oral   Take 20 mEq by mouth 3 (three) times daily.         . promethazine (PHENERGAN) 12.5 MG tablet   Oral   Take 12.5 mg by mouth every 6 (six) hours as needed for nausea.         Marland Kitchen senna-docusate (SENOKOT-S) 8.6-50 MG per tablet   Oral   Take 2 tablets by mouth at bedtime.   60 tablet   0   . docusate sodium  (COLACE) 100 MG capsule   Oral   Take 1 capsule (100 mg total) by mouth 2 (two) times daily.   10 capsule   0   . ondansetron (ZOFRAN ODT) 8 MG disintegrating tablet   Oral   Take 1 tablet (8 mg total) by mouth every 8 (eight) hours as needed for nausea.   20 tablet   0     BP 174/73  Pulse 69  Temp(Src) 98.2 F (36.8 C) (Oral)  Resp 17  SpO2 100%  Physical Exam  Nursing note and vitals reviewed. Constitutional: No distress.  Frail   HENT:  Head: Normocephalic and atraumatic.  Right Ear: External ear normal.  Left Ear: External ear normal.  Eyes: Conjunctivae are normal. Right eye exhibits no discharge. Left eye exhibits no discharge. No scleral icterus.  Neck: Neck supple. No tracheal deviation present.  Cardiovascular: Normal rate, regular rhythm and intact distal pulses.   Pulmonary/Chest: Effort normal and breath sounds normal. No stridor. No respiratory distress. She has no wheezes. She has no rales.  Abdominal: Soft. Bowel sounds are normal. She exhibits no distension, no abdominal bruit, no ascites and no mass. There is tenderness in the right lower quadrant and left lower quadrant. There is no rebound and no guarding. No hernia.  Musculoskeletal: She exhibits edema and tenderness.  Mild Pain with range of motion bilateral hips, mild edema lower extremities, normal pulses throughout,  Neurological: She is alert. She displays no tremor. No sensory deficit. Cranial nerve deficit:  no gross defecits noted. She exhibits normal muscle tone. She displays no seizure activity. Coordination normal.  Generalized weakness  Skin: Skin is warm and dry. No rash noted. She is not diaphoretic. There is pallor.  Psychiatric: She has a normal mood and affect.    ED Course  Procedures (including critical care time) EKG  Rate: 71  Rhythm: normal sinus rhythm  QRS Axis: normal  Intervals: normal  ST/T Wave abnormalities: normal  Conduction Disutrbances:none  Narrative  Interpretation: nl  Old EKG Reviewed: no changes  Labs Reviewed  GLUCOSE, CAPILLARY - Abnormal; Notable for the following:    Glucose-Capillary 68 (*)    All other components within normal limits  COMPREHENSIVE METABOLIC PANEL - Abnormal; Notable for the following:    GFR calc non Af Amer 83 (*)    All other components within normal limits  URINALYSIS, ROUTINE W REFLEX MICROSCOPIC - Abnormal; Notable for the following:    Nitrite POSITIVE (*)    Leukocytes, UA TRACE (*)    All other components within normal limits  CBC WITH DIFFERENTIAL - Abnormal; Notable for the following:    MCH 25.0 (*)    RDW 15.8 (*)    All other components within normal limits  URINE MICROSCOPIC-ADD ON - Abnormal; Notable for the following:    Bacteria, UA FEW (*)    All other components within normal limits  URINE CULTURE  LIPASE, BLOOD  TROPONIN I  GLUCOSE, CAPILLARY  GLUCOSE, CAPILLARY   Ct Abdomen Pelvis W Contrast  12/20/2012  *RADIOLOGY REPORT*  Clinical Data: Weakness.  Found then.  CT ABDOMEN AND PELVIS WITH CONTRAST  Technique:  Multidetector CT imaging of the abdomen and pelvis was performed following the standard protocol during bolus administration of intravenous contrast.  Contrast:  100 ml Omnipaque-300  Comparison: Acute abdominal series 12/20/2012 and CT abdomen pelvis 02/26/2012  Findings: Lung bases:  Streaky atelectasis or scarring at the right lung base.  No consolidation or pleural effusion.  Abdomen/pelvis:  Stable mild elevation of the right hemidiaphragm. Status post cholecystectomy.  Common bile duct measures up to approximately 9 mm, stable compared to prior examination.  Pancreas is somewhat atrophic, and stable.  A celiac axis lymph node measures at 14 mm short axis, unchanged compared to prior exam.  Liver is normal in size and enhancement.  There are no focal hepatic lesions.  Portal vein is patent.  Spleen is normal in size and enhancement.  Left adrenal gland is mildly thickened and  stable.  There is no focal nodule in the left adrenal gland.  The right adrenal gland is normal.  Cyst in the mid pole of the left kidney is stable.  A few too small to characterize low density lesions in both kidneys are stable and likely cysts.  There is no hydronephrosis.  The stomach is decompressed and appears within normal limits. Small bowel loops are normal in caliber.  There is a prominent amount of stool in the colon, raising the possibility of constipation.  There is no evidence of fecal impaction.  Urinary bladder moderately distended and demonstrates normal wall thickness on today's study.  Hysterectomy.  Negative for adnexal mass.  There is heavy atherosclerotic calcification of the normal caliber abdominal aorta.  There is scattered atherosclerotic calcification in the superior mesenteric artery.  There is a severe compression fracture of the L1 vertebral body. Approximately 4 mm retropulsion of the posterior vertebral body into the spinal canal.  This appears stable compared to the lumbar spine radiographs of 10/21/2012.  There are postoperative changes of posterior spinal fusion spanning L3-L5.  IMPRESSION:  1.  Prominent amount of stool throughout the colon suggests constipation. 2.  Normal appearance of the urinary bladder (the previously described areas of urinary bladder wall thickening on the CT July 2013 are no longer present). 3.  Post cholecystectomy with mild prominence of the common bile duct, stable. 4.  Stable prominent 14 mm celiac axis lymph node.  No new or enlarging  adenopathy is identified in the abdomen or pelvis. 5.  Atherosclerosis of the normal caliber abdominal aorta and scattered within the superior mesenteric artery. 6.  Stable left renal cyst and scattered low density lesions in the kidneys too small to characterize. 7.  Stable marked compression fracture of the L1 with mild retropulsion of the posterior cortex. 8.  Postsurgical changes of the posterior spinal fusion  spanning L3- L5.   Original Report Authenticated By: Britta Mccreedy, M.D.    Dg Abd Acute W/chest  12/20/2012  *RADIOLOGY REPORT*  Clinical Data: Abdominal pain for 2-3 days with nausea and vomiting  ACUTE ABDOMEN SERIES (ABDOMEN 2 VIEW & CHEST 1 VIEW)  Comparison: Lumbar spine radiographs 10/21/2012. Acute abdominal series 12/12/2011  Findings: Cardiac leads project over the chest.  The heart, mediastinal, and hilar contours are within normal limits.  The lungs are clear.  There is no free intraperitoneal air.  Cholecystectomy clips are noted.  There is a  stable convex left scoliosis of the lumbar spine. There is a stable moderate to severe compression fracture of the L1 vertebral body.  There are postoperative changes of fusion spanning L3 through L5.  The bowel gas pattern is nonobstructive. There are mild degenerative changes of both hips.  No radiopaque urinary tract stone is identified.  Bilateral pelvic phleboliths are stable.  IMPRESSION:  1.  No acute cardiopulmonary disease. 2.  Nonobstructive bowel gas pattern. 3.  Stable chronic compression deformity of the L1 vertebral body, convex left scoliosis and postsurgical changes of the lumbar spine.   Original Report Authenticated By: Britta Mccreedy, M.D.      1. Nausea   2. Constipation       MDM  Patient does not appear to have any evidence of any acute abnormality based on her evaluation in the emergency department. She does not appear to have appendicitis, colitis or other acute abdominal process. She does have possibility of constipation. The patient has some abnormalities in your urinalysis I am not convinced that she is urinary tract infection. I will send off a urine culture.  She has chronic back problems she takes OxyContin daily. This may be contributing to her abdominal pain problems and certainly her constipation.   To discharge her home on stool softeners. I will give her prescription for Zofran. Patient is instructed to followup  with her primary Dr. for further evaluation.  These findings were discussed with the patient and her family.        Celene Kras, MD 12/20/12 [phone]

## 2012-12-20 NOTE — ED Notes (Signed)
Pt O2 sat 86-90 on RA. Pt placed on 2lpm per nurse. Current O2 sat 97 on 2lpm.

## 2012-12-20 NOTE — ED Notes (Signed)
Per EMS: from [facility], staff states that roommate found her in the bathroom this morning bent over in wheelchair with head in b/w legs, generalized weakness. Pt unable to keep any food or drink down.

## 2012-12-20 NOTE — ED Notes (Signed)
[id number]<ZH> Expected date:<BR> Expected time:<BR> Means of arrival:<BR> Comments:<BR> 79 y/o F [place]

## 2012-12-23 LAB — URINE CULTURE

## 2012-12-24 ENCOUNTER — Telehealth (HOSPITAL_COMMUNITY): Payer: Self-pay | Admitting: Emergency Medicine

## 2012-12-24 NOTE — ED Notes (Signed)
Post ED Visit - Positive Culture Follow-up: Successful Patient Follow-Up  Culture assessed and recommendations reviewed by: []  Wes Dulaney, Pharm.D., BCPS []  Celedonio Miyamoto, Pharm.D., BCPS []  Georgina Pillion, Pharm.D., BCPS []  [place], [place].D., BCPS, AAHIVP []  Estella Husk, Pharm.D., BCPS, AAHIVP [x]  Laurence Slate, [address].D.  Positive urine culture  [x]  Patient discharged without antimicrobial prescription and treatment is now indicated []  Organism is resistant to prescribed ED discharge antimicrobial []  Patient with positive blood cultures  Changes discussed with ED provider: Roxy Horseman PA-C New antibiotic prescription Macrobid 100 mg BID x 10 days    Kylie A Holland 12/24/2012, 6:30 PM

## 2012-12-24 NOTE — Progress Notes (Signed)
ED Antimicrobial Stewardship Positive Culture Follow Up   [name] is an 79 y.o. female who presented to [facility] on 12/20/2012 with a chief complaint of  Chief Complaint  Patient presents with  . Weakness  . Emesis    Recent Results (from the past 720 hour(s))  URINE CULTURE     Status: None   Collection Time    12/20/12  4:30 PM      Result Value Range Status   Specimen Description URINE, CATHETERIZED   Final   Special Requests Normal   Final   Culture  Setup Time 12/21/2012 09:15   Final   Colony Count >=100,000 COLONIES/ML   Final   Culture Kathryne Gin   Final   Report Status 12/23/2012 FINAL   Final   Organism ID, Bacteria KLUYVERA ASCORBATA   Final     [x]  Patient discharged originally without antimicrobial agent and treatment is now indicated  New antibiotic prescription: recommend Macrobid 100mg  PO BID x 10 days  ED Provider: Arlyss Repress 12/24/2012, 11:41 AM Infectious Diseases Pharmacist Phone# [phone]

## 2012-12-25 ENCOUNTER — Telehealth (HOSPITAL_COMMUNITY): Payer: Self-pay | Admitting: Emergency Medicine

## 2012-12-27 ENCOUNTER — Telehealth (HOSPITAL_COMMUNITY): Payer: Self-pay | Admitting: Emergency Medicine

## 2012-12-27 NOTE — ED Notes (Signed)
Post ED Visit - Positive Culture Follow-up: Successful Patient Follow-Up  Culture assessed and recommendations made  Positive [facility] culture  [facility] [phone] results and recommendations faxed to [phone] Jaymes Graff, date 12/27/12 , time 10:40 am  New antibiotic prescription Macrobid 100 mg po BID x 10 days     Arvid Right 12/27/2012, 10:41 AM

## 2013-01-13 ENCOUNTER — Emergency Department (HOSPITAL_COMMUNITY): Payer: Medicare Other

## 2013-01-13 ENCOUNTER — Inpatient Hospital Stay (HOSPITAL_COMMUNITY)
Admission: EM | Admit: 2013-01-13 | Discharge: 2013-01-18 | DRG: 689 | Disposition: A | Discharge status: Skilled Nursing Facility | Payer: Medicare Other | Attending: Internal Medicine | Admitting: Internal Medicine

## 2013-01-13 ENCOUNTER — Encounter (HOSPITAL_COMMUNITY): Payer: Self-pay | Admitting: Family Medicine

## 2013-01-13 ENCOUNTER — Other Ambulatory Visit: Payer: Self-pay

## 2013-01-13 DIAGNOSIS — I10 Essential (primary) hypertension: Secondary | ICD-10-CM | POA: Diagnosis present

## 2013-01-13 DIAGNOSIS — R4182 Altered mental status, unspecified: Secondary | ICD-10-CM

## 2013-01-13 DIAGNOSIS — Z961 Presence of intraocular lens: Secondary | ICD-10-CM

## 2013-01-13 DIAGNOSIS — Z888 Allergy status to other drugs, medicaments and biological substances status: Secondary | ICD-10-CM

## 2013-01-13 DIAGNOSIS — M545 Low back pain, unspecified: Secondary | ICD-10-CM | POA: Diagnosis present

## 2013-01-13 DIAGNOSIS — E119 Type 2 diabetes mellitus without complications: Secondary | ICD-10-CM | POA: Diagnosis present

## 2013-01-13 DIAGNOSIS — R32 Unspecified urinary incontinence: Secondary | ICD-10-CM | POA: Diagnosis present

## 2013-01-13 DIAGNOSIS — Z8261 Family history of arthritis: Secondary | ICD-10-CM

## 2013-01-13 DIAGNOSIS — G8929 Other chronic pain: Secondary | ICD-10-CM | POA: Diagnosis present

## 2013-01-13 DIAGNOSIS — Z882 Allergy status to sulfonamides status: Secondary | ICD-10-CM

## 2013-01-13 DIAGNOSIS — K219 Gastro-esophageal reflux disease without esophagitis: Secondary | ICD-10-CM | POA: Diagnosis present

## 2013-01-13 DIAGNOSIS — Z794 Long term (current) use of insulin: Secondary | ICD-10-CM

## 2013-01-13 DIAGNOSIS — Z79899 Other long term (current) drug therapy: Secondary | ICD-10-CM

## 2013-01-13 DIAGNOSIS — G9341 Metabolic encephalopathy: Secondary | ICD-10-CM | POA: Diagnosis present

## 2013-01-13 DIAGNOSIS — G9340 Encephalopathy, unspecified: Secondary | ICD-10-CM

## 2013-01-13 DIAGNOSIS — G5790 Unspecified mononeuropathy of unspecified lower limb: Secondary | ICD-10-CM | POA: Diagnosis present

## 2013-01-13 DIAGNOSIS — B961 Klebsiella pneumoniae [K. pneumoniae] as the cause of diseases classified elsewhere: Secondary | ICD-10-CM | POA: Diagnosis present

## 2013-01-13 DIAGNOSIS — Z7982 Long term (current) use of aspirin: Secondary | ICD-10-CM

## 2013-01-13 DIAGNOSIS — Z66 Do not resuscitate: Secondary | ICD-10-CM | POA: Diagnosis present

## 2013-01-13 DIAGNOSIS — Z881 Allergy status to other antibiotic agents status: Secondary | ICD-10-CM

## 2013-01-13 DIAGNOSIS — F329 Major depressive disorder, single episode, unspecified: Secondary | ICD-10-CM | POA: Diagnosis present

## 2013-01-13 DIAGNOSIS — N390 Urinary tract infection, site not specified: Principal | ICD-10-CM | POA: Diagnosis present

## 2013-01-13 DIAGNOSIS — M79605 Pain in left leg: Secondary | ICD-10-CM | POA: Diagnosis present

## 2013-01-13 DIAGNOSIS — Z9089 Acquired absence of other organs: Secondary | ICD-10-CM

## 2013-01-13 DIAGNOSIS — F3289 Other specified depressive episodes: Secondary | ICD-10-CM | POA: Diagnosis present

## 2013-01-13 DIAGNOSIS — Z9849 Cataract extraction status, unspecified eye: Secondary | ICD-10-CM

## 2013-01-13 HISTORY — DX: Altered mental status, unspecified: R41.82

## 2013-01-13 LAB — CBC WITH DIFFERENTIAL/PLATELET
Basophils Relative: 0 % (ref 0–1)
Hemoglobin: 14 g/dL (ref 12.0–15.0)
MCHC: 33.9 g/dL (ref 30.0–36.0)
Monocytes Relative: 7 % (ref 3–12)
Neutro Abs: 4.2 10*3/uL (ref 1.7–7.7)
Neutrophils Relative %: 64 % (ref 43–77)
Platelets: 252 10*3/uL (ref 150–400)
RBC: 5.16 MIL/uL — ABNORMAL HIGH (ref 3.87–5.11)

## 2013-01-13 LAB — POCT I-STAT, CHEM 8
BUN: 12 mg/dL (ref 6–23)
Calcium, Ion: 1.18 mmol/L (ref 1.13–1.30)
Chloride: 94 mEq/L — ABNORMAL LOW (ref 96–112)
Creatinine, Ser: 0.8 mg/dL (ref 0.50–1.10)
Glucose, Bld: 116 mg/dL — ABNORMAL HIGH (ref 70–99)
TCO2: 33 mmol/L (ref 0–100)

## 2013-01-13 LAB — COMPREHENSIVE METABOLIC PANEL
ALT: 12 U/L (ref 0–35)
AST: 18 U/L (ref 0–37)
Albumin: 3.8 g/dL (ref 3.5–5.2)
Alkaline Phosphatase: 95 U/L (ref 39–117)
BUN: 12 mg/dL (ref 6–23)
Chloride: 95 mEq/L — ABNORMAL LOW (ref 96–112)
Potassium: 3.6 mEq/L (ref 3.5–5.1)
Sodium: 133 mEq/L — ABNORMAL LOW (ref 135–145)
Total Bilirubin: 0.3 mg/dL (ref 0.3–1.2)
Total Protein: 7 g/dL (ref 6.0–8.3)

## 2013-01-13 LAB — GLUCOSE, CAPILLARY
Glucose-Capillary: 109 mg/dL — ABNORMAL HIGH (ref 70–99)
Glucose-Capillary: 111 mg/dL — ABNORMAL HIGH (ref 70–99)
Glucose-Capillary: 217 mg/dL — ABNORMAL HIGH (ref 70–99)

## 2013-01-13 LAB — URINALYSIS, ROUTINE W REFLEX MICROSCOPIC
Bilirubin Urine: NEGATIVE
Glucose, UA: NEGATIVE mg/dL
Hgb urine dipstick: NEGATIVE
Ketones, ur: NEGATIVE mg/dL
Protein, ur: NEGATIVE mg/dL
pH: 7 (ref 5.0–8.0)

## 2013-01-13 LAB — URINE MICROSCOPIC-ADD ON

## 2013-01-13 LAB — CG4 I-STAT (LACTIC ACID): Lactic Acid, Venous: 0.78 mmol/L (ref 0.5–2.2)

## 2013-01-13 MED ORDER — POLYETHYLENE GLYCOL 3350 17 G PO PACK: 17.0000 g | PACK | Freq: Every day | ORAL | Status: DC | PRN

## 2013-01-13 MED ORDER — DEXTROSE 5 % IV SOLN
1.0000 g | INTRAVENOUS | Status: DC
  Administered 2013-01-13 – 2013-01-18 (×6): 1 g via INTRAVENOUS
  Filled 2013-01-13 (×7): qty 10

## 2013-01-13 MED ORDER — INSULIN GLARGINE 100 UNIT/ML ~~LOC~~ SOLN
20.0000 [IU] | Freq: Every day | SUBCUTANEOUS | Status: DC
  Administered 2013-01-13 – 2013-01-16 (×4): 20 [IU] via SUBCUTANEOUS
  Filled 2013-01-13 (×5): qty 0.2

## 2013-01-13 MED ORDER — LORAZEPAM 0.5 MG PO TABS: 0.5000 mg | ORAL_TABLET | Freq: Two times a day (BID) | ORAL | Status: DC | PRN

## 2013-01-13 MED ORDER — DOCUSATE SODIUM 100 MG PO CAPS
100.0000 mg | ORAL_CAPSULE | Freq: Two times a day (BID) | ORAL | Status: DC
  Administered 2013-01-13 – 2013-01-16 (×5): 100 mg via ORAL
  Filled 2013-01-13 (×5): qty 1

## 2013-01-13 MED ORDER — MORPHINE SULFATE ER 15 MG PO TBCR
15.0000 mg | EXTENDED_RELEASE_TABLET | Freq: Two times a day (BID) | ORAL | Status: DC
  Administered 2013-01-13 – 2013-01-18 (×10): 15 mg via ORAL
  Filled 2013-01-13 (×10): qty 1

## 2013-01-13 MED ORDER — OXYCODONE HCL 5 MG PO TABS
5.0000 mg | ORAL_TABLET | ORAL | Status: DC | PRN
  Administered 2013-01-14: 5 mg via ORAL
  Filled 2013-01-13 (×2): qty 1

## 2013-01-13 MED ORDER — OXYBUTYNIN CHLORIDE 5 MG PO TABS
5.0000 mg | ORAL_TABLET | Freq: Two times a day (BID) | ORAL | Status: DC
  Administered 2013-01-13: 5 mg via ORAL
  Filled 2013-01-13 (×3): qty 1

## 2013-01-13 MED ORDER — ONDANSETRON HCL 4 MG/2ML IJ SOLN: 4.0000 mg | Freq: Three times a day (TID) | INTRAMUSCULAR | Status: AC | PRN

## 2013-01-13 MED ORDER — POLYSACCHARIDE IRON COMPLEX 150 MG PO CAPS
150.0000 mg | ORAL_CAPSULE | Freq: Two times a day (BID) | ORAL | Status: DC
  Administered 2013-01-14 – 2013-01-18 (×10): 150 mg via ORAL
  Filled 2013-01-13 (×14): qty 1

## 2013-01-13 MED ORDER — SODIUM CHLORIDE 0.9 % IV SOLN
INTRAVENOUS | Status: DC
  Administered 2013-01-14: 04:00:00 via INTRAVENOUS

## 2013-01-13 MED ORDER — MORPHINE SULFATE 2 MG/ML IJ SOLN
2.0000 mg | Freq: Once | INTRAMUSCULAR | Status: AC
  Administered 2013-01-13: 2 mg via INTRAVENOUS
  Filled 2013-01-13: qty 1

## 2013-01-13 MED ORDER — DULOXETINE HCL 30 MG PO CPEP
30.0000 mg | ORAL_CAPSULE | Freq: Every day | ORAL | Status: DC
  Administered 2013-01-14 – 2013-01-18 (×5): 30 mg via ORAL
  Filled 2013-01-13 (×5): qty 1

## 2013-01-13 MED ORDER — LORAZEPAM 2 MG/ML IJ SOLN
0.5000 mg | Freq: Once | INTRAMUSCULAR | Status: AC
  Administered 2013-01-13: 11:00:00 via INTRAVENOUS
  Filled 2013-01-13: qty 1

## 2013-01-13 MED ORDER — LIDOCAINE 5 % EX PTCH
1.0000 | MEDICATED_PATCH | CUTANEOUS | Status: DC
  Administered 2013-01-13 – 2013-01-18 (×6): 1 via TRANSDERMAL
  Filled 2013-01-13 (×6): qty 1

## 2013-01-13 MED ORDER — INSULIN ASPART 100 UNIT/ML ~~LOC~~ SOLN: 0.0000 [IU] | Freq: Three times a day (TID) | SUBCUTANEOUS | Status: DC

## 2013-01-13 MED ORDER — SODIUM CHLORIDE 0.9 % IV SOLN
INTRAVENOUS | Status: AC
  Administered 2013-01-13: 1000 mL via INTRAVENOUS

## 2013-01-13 MED ORDER — ACETAMINOPHEN 325 MG PO TABS: 650.0000 mg | ORAL_TABLET | Freq: Four times a day (QID) | ORAL | Status: DC | PRN

## 2013-01-13 MED ORDER — ENOXAPARIN SODIUM 30 MG/0.3ML ~~LOC~~ SOLN
30.0000 mg | SUBCUTANEOUS | Status: DC
  Administered 2013-01-13 – 2013-01-18 (×6): 30 mg via SUBCUTANEOUS
  Filled 2013-01-13 (×8): qty 0.3

## 2013-01-13 MED ORDER — NALOXONE HCL 0.4 MG/ML IJ SOLN
0.4000 mg | Freq: Once | INTRAMUSCULAR | Status: AC
  Administered 2013-01-13: 0.4 mg via INTRAVENOUS
  Filled 2013-01-13: qty 1

## 2013-01-13 MED ORDER — ASPIRIN EC 81 MG PO TBEC
81.0000 mg | DELAYED_RELEASE_TABLET | Freq: Every day | ORAL | Status: DC
  Administered 2013-01-14 – 2013-01-18 (×5): 81 mg via ORAL
  Filled 2013-01-13 (×5): qty 1

## 2013-01-13 MED ORDER — PROMETHAZINE HCL 25 MG PO TABS
12.5000 mg | ORAL_TABLET | Freq: Four times a day (QID) | ORAL | Status: DC | PRN
  Administered 2013-01-17: 12.5 mg via ORAL
  Filled 2013-01-13: qty 1

## 2013-01-13 MED ORDER — INSULIN ASPART 100 UNIT/ML ~~LOC~~ SOLN
0.0000 [IU] | Freq: Three times a day (TID) | SUBCUTANEOUS | Status: DC
  Administered 2013-01-14: 2 [IU] via SUBCUTANEOUS
  Administered 2013-01-15: 1 [IU] via SUBCUTANEOUS
  Administered 2013-01-15: 2 [IU] via SUBCUTANEOUS
  Administered 2013-01-16: 3 [IU] via SUBCUTANEOUS
  Administered 2013-01-16: 2 [IU] via SUBCUTANEOUS
  Administered 2013-01-16: 1 [IU] via SUBCUTANEOUS
  Administered 2013-01-17 (×2): 2 [IU] via SUBCUTANEOUS
  Administered 2013-01-17 – 2013-01-18 (×2): 1 [IU] via SUBCUTANEOUS
  Administered 2013-01-18: 2 [IU] via SUBCUTANEOUS

## 2013-01-13 MED ORDER — PANTOPRAZOLE SODIUM 40 MG PO TBEC
40.0000 mg | DELAYED_RELEASE_TABLET | Freq: Every day | ORAL | Status: DC
  Administered 2013-01-14 – 2013-01-18 (×5): 40 mg via ORAL
  Filled 2013-01-13 (×5): qty 1

## 2013-01-13 MED ORDER — SENNOSIDES-DOCUSATE SODIUM 8.6-50 MG PO TABS
2.0000 | ORAL_TABLET | Freq: Every day | ORAL | Status: DC
  Administered 2013-01-13 – 2013-01-16 (×3): 2 via ORAL
  Filled 2013-01-13 (×3): qty 2

## 2013-01-13 NOTE — H&P (Addendum)
[facility] History and Physical  [name] ZOX:[id number] DOB: 1932/09/26 DOA: 01/13/2013  Referring physician: EDP PCP: Tonya Funk, MD Chief Complaint: Decreased responsiveness  HPI: [name] is a 79 y.o. female with DM, DJD, chronic back pain on Oxycontin, resident of ALF presented to the ER with decreased responsiveness this morning. She was seen by her husband yesterday evening and felt to be in usual state of health, usually she is alert, interactive and ambulates with a walker. Today she was noted to be obtunded and very poorly responsive and brought to the ER for evaluation. Upon evaluation in ER, labs unremarkable, CT head and CXR benign, UA unchanged from prior. She was seen in ER on 5/20 after a fall, she was discharged back on Macrodantin for UTI. She was given narcan in the ED and woke up and started writhing in pain.    Review of Systems:  The patient denies anorexia, fever, weight loss,, vision loss, decreased hearing, hoarseness, chest pain, syncope, dyspnea on exertion, peripheral edema, balance deficits, hemoptysis, abdominal pain, melena, hematochezia, severe indigestion/heartburn, hematuria, incontinence, genital sores, muscle weakness, suspicious skin lesions, transient blindness, difficulty walking, depression, unusual weight change, abnormal bleeding, enlarged lymph nodes, angioedema, and breast masses.    Past Medical History  Diagnosis Date  . Incontinence of urine   . Diarrhea   . Neuropathy     bilateral feet  . GERD (gastroesophageal reflux disease)   . Depression   . Urinary, incontinence, stress female   . History of shingles 2009  . Degenerative lumbar spinal stenosis   . Type II diabetes mellitus     Type 2 IDDM  X 40 years;   . Arthritis     lower back/hands   Past Surgical History  Procedure Laterality Date  . Incontinence surgery  2005  . Tonsillectomy  1964  . Abdominal hysterectomy      1982  . Ovarian cyst surgery   1972  . Cholecystectomy  1988  . Cystoscopy  2009    macroplastique inj  . Rectocele repair  10/15/2011    Procedure: POSTERIOR REPAIR (RECTOCELE);  Surgeon: Tonya Ludwig, MD;  Location: [facility];  Service: Urology;;  posterior vaginal vault repair with sacral spinus repair with graft  . Cystoscopy  10/15/2011    Procedure: CYSTOSCOPY;  Surgeon: Tonya Ludwig, MD;  Location: [facility];  Service: Urology;  Laterality: N/A;  . Flexible sigmoidoscopy  02/28/2012    Procedure: FLEXIBLE SIGMOIDOSCOPY;  Surgeon: Tonya Modena, MD;  Location: [facility];  Service: Endoscopy;  Laterality: N/A;  . Breast biopsy  1990    bilaterally  . Cataract extraction w/ intraocular lens implant    . Posterior fusion lumbar spine  05/09/2012   Social History:  reports that she has never smoked. She has never used smokeless tobacco. She reports that she does not drink alcohol or use illicit drugs. Lives at ALF  Allergies  Allergen Reactions  . Ciprofloxacin Rash  . Neosporin (Neomycin-Bacitracin Zn-Polymyx) Rash  . Sulfa Antibiotics Rash    Family History  Problem Relation Age of Onset  . Osteoarthritis Sister   . Osteoarthritis Brother     Prior to Admission medications   Medication Sig Start Date End Date Taking? Authorizing Provider  acetaminophen (TYLENOL) 325 MG tablet Take 650 mg by mouth every 6 (six) hours as needed. For pain   Yes Historical Provider, MD  amiloride-hydrochlorothiazide (MODURETIC) 5-50 MG tablet Take 0.5 tablets by mouth daily.  Yes Historical Provider, MD  aspirin EC 81 MG tablet Take 81 mg by mouth daily.   Yes Historical Provider, MD  calcium citrate-vitamin D (CITRACAL+D) 315-200 MG-UNIT per tablet Take 2 tablets by mouth daily.   Yes Historical Provider, MD  cholecalciferol (VITAMIN D) 1000 UNITS tablet Take 1,000 Units by mouth daily.   Yes Historical Provider, MD  conjugated estrogens (PREMARIN) vaginal cream Place 1 g  vaginally 2 (two) times a week. Tuesdays and Fridays.   Yes Historical Provider, MD  docusate sodium (COLACE) 100 MG capsule Take 1 capsule (100 mg total) by mouth 2 (two) times daily. 12/20/12  Yes Tonya Kras, MD  DULoxetine (CYMBALTA) 30 MG capsule Take 1 capsule (30 mg total) by mouth daily. 02/08/12 02/07/13 Yes Tonya Mountain, MD  fexofenadine (ALLEGRA) 180 MG tablet Take 180 mg by mouth daily.   Yes Historical Provider, MD  insulin aspart (NOVOLOG) 100 UNIT/ML injection Inject 8 Units into the skin 3 (three) times daily before meals.   Yes Historical Provider, MD  insulin glargine (LANTUS) 100 UNIT/ML injection Inject 30 Units into the skin at bedtime.   Yes Historical Provider, MD  iron polysaccharides (NIFEREX) 150 MG capsule Take 150 mg by mouth 2 (two) times daily.   Yes Historical Provider, MD  lidocaine (LIDODERM) 5 % Place 1 patch onto the skin daily. Remove & Discard patch within 12 hours or as directed by MD   Yes Historical Provider, MD  LORazepam (ATIVAN) 0.5 MG tablet Take 1 tablet (0.5 mg total) by mouth every 12 (twelve) hours. For anxiety. 05/13/12  Yes Tonya Shorts, MD  morphine (MS CONTIN) 30 MG 12 hr tablet Take 30 mg by mouth 2 (two) times daily.   Yes Historical Provider, MD  Nutritional Supplements (NUTRITIONAL DRINK PO) Take 237 mLs by mouth 3 (three) times daily.   Yes Historical Provider, MD  olopatadine (PATANOL) 0.1 % ophthalmic solution Place 1 drop into both eyes 2 (two) times daily as needed for allergies.    Yes Historical Provider, MD  omeprazole (PRILOSEC) 20 MG capsule Take 20 mg by mouth daily.   Yes Historical Provider, MD  ondansetron (ZOFRAN ODT) 8 MG disintegrating tablet Take 1 tablet (8 mg total) by mouth every 8 (eight) hours as needed for nausea. 12/20/12  Yes Tonya Kras, MD  oxybutynin (DITROPAN) 5 MG tablet Take 5 mg by mouth 2 (two) times daily.   Yes Historical Provider, MD  oxyCODONE (OXY IR/ROXICODONE) 5 MG immediate release tablet Take 5 mg  by mouth every 4 (four) hours as needed for pain.   Yes Historical Provider, MD  polyethylene glycol (MIRALAX / GLYCOLAX) packet Take 17 g by mouth daily as needed (for constipation).  03/03/12  Yes Tonya Mountain, MD  potassium chloride SA (K-DUR,KLOR-CON) 20 MEQ tablet Take 20 mEq by mouth 3 (three) times daily. 03/03/12 03/03/13 Yes Tonya Mountain, MD  promethazine (PHENERGAN) 12.5 MG tablet Take 12.5 mg by mouth every 6 (six) hours as needed for nausea.   Yes Historical Provider, MD  senna-docusate (SENOKOT-S) 8.6-50 MG per tablet Take 2 tablets by mouth at bedtime. 02/08/12 02/07/13 Yes Tonya Mountain, MD   Physical Exam: Filed Vitals:   01/13/13 1115 01/13/13 1145 01/13/13 1215 01/13/13 1300  BP: 148/123 165/92 151/85 132/63  Pulse: 124 131 114 104  Temp:    99 F (37.2 C)  TempSrc:    Oral  Resp: 20 21 19 16   SpO2: 100% 100% 98% 98%  General:  Awake, crying, talking   HEENT: PERRLA, EOMI, no JVD  Cardiovascular: S1S2/RRR  Respiratory: CTAB  Abdomen: soft, NT, BS present  Skin: no rashes or skin breakdown  Musculoskeletal: no edema c/c  Psychiatric: anxious, agitated,   Neurologic: moves all extremities, non focal  Labs on Admission:  Basic Metabolic Panel:  Recent Labs Lab 01/13/13 0829 01/13/13 0843  NA 133* 135  K 3.6 3.6  CL 95* 94*  CO2 29  --   GLUCOSE 116* 116*  BUN 12 12  CREATININE 0.69 0.80  CALCIUM 9.5  --    Liver Function Tests:  Recent Labs Lab 01/13/13 0829  AST 18  ALT 12  ALKPHOS 95  BILITOT 0.3  PROT 7.0  ALBUMIN 3.8   No results found for this basename: LIPASE, AMYLASE,  in the last 168 hours No results found for this basename: AMMONIA,  in the last 168 hours CBC:  Recent Labs Lab 01/13/13 0829 01/13/13 0843  WBC 6.7  --   NEUTROABS 4.2  --   HGB 14.0 15.0  HCT 41.3 44.0  MCV 80.0  --   PLT 252  --    Cardiac Enzymes: No results found for this basename: CKTOTAL, CKMB, CKMBINDEX, TROPONINI,  in the  last 168 hours  BNP (last 3 results) No results found for this basename: PROBNP,  in the last 8760 hours CBG:  Recent Labs Lab 01/13/13 1057  GLUCAP 109*    Radiological Exams on Admission: Ct Head Wo Contrast (only If Suspected Head Trauma And/or Pt Is On Anticoagulant)  01/13/2013   *RADIOLOGY REPORT*  Clinical Data: Patient found unresponsive.  CT HEAD WITHOUT CONTRAST  Technique:  Contiguous axial images were obtained from the base of the skull through the vertex without contrast.  Comparison: Head CT scan 12/03/2012.  Findings: No evidence of acute intracranial abnormality including infarct, hemorrhage, mass lesion, mass effect, midline shift or abnormal extra-axial fluid collection is identified.  There is some cortical atrophy and chronic microvascular ischemic change. Mucosal thickening in the maxillary sinuses, worse on the right, appears unchanged.  IMPRESSION: No acute finding.  Stable compared to prior exam.   Original Report Authenticated By: Holley Dexter, M.D.   Dg Chest Portable 1 View  01/13/2013   *RADIOLOGY REPORT*  Clinical Data: Altered mental status.  PORTABLE CHEST - 1 VIEW  Comparison: Single view of the chest 12/20/2012.  Findings: Lung volumes are low with mild basilar atelectasis.  The lungs are otherwise clear.  Heart size is normal.  No pneumothorax or pleural effusion.  IMPRESSION: No acute finding.   Original Report Authenticated By: Holley Dexter, M.D.    EKG: Independently reviewed.  Assessment/Plan  1. Metabolic encephalopathy -I suspect partially treated UTI, and polypharmacy related  -Urine CX 5/20 with Kluyvera Ascorbata, treated with Macrodantin then but not clear if sensitive to this. -start IV ceftriaxone -repeat Urine Cx -Cut down narcotics and titrate as tolerated as mentation improved  2. DM:  -resume lantus at lower dose -SSI  3. DJD/chronic low back pain -resume narcotics at lower dose and titrate  4. HTN -stable, hold  HCTZ  DVt proph: -lovenox   Code Status: DNR Family Communication: d/w son at bedside Disposition Plan:  Time spent:  [facility] Pager [phone]  If 7PM-7AM, please contact night-coverage [email or web address] Password [email or web address] 01/13/2013, 1:16 PM

## 2013-01-13 NOTE — ED Provider Notes (Signed)
History     CSN: [id number]  Arrival date & time 01/13/13  0812   First MD Initiated Contact with Patient 01/13/13 [phone]      Chief Complaint  Patient presents with  . Altered Mental Status    (Consider location/radiation/quality/duration/timing/severity/associated sxs/prior treatment) HPI Comments: 79 year old female with a history of diabetes, spinal stenosis and arthritis who presents from her assisted care facility after she was found unresponsive this morning. According to family members she was complaining of some back pain yesterday but after getting pain medication was back to normal and her normal self. Baseline for the patient is able to feed herself, able to get around her room in a wheelchair or able to ambulate with some assistance. She had not been complaining of fevers vomiting or diarrhea. The patient is unable to answer any questions, she is obtunded, she does not follow commands in the room.  Patient is a 79 y.o. female presenting with altered mental status. The history is provided by the patient, medical records, the EMS personnel and the spouse.  Altered Mental Status   Past Medical History  Diagnosis Date  . Incontinence of urine   . Diarrhea   . Neuropathy     bilateral feet  . GERD (gastroesophageal reflux disease)   . Depression   . Urinary, incontinence, stress female   . History of shingles 2009  . Degenerative lumbar spinal stenosis   . Type II diabetes mellitus     Type 2 IDDM  X 40 years;   . Arthritis     lower back/hands    Past Surgical History  Procedure Laterality Date  . Incontinence surgery  2005  . Tonsillectomy  1964  . Abdominal hysterectomy      1982  . Ovarian cyst surgery  1972  . Cholecystectomy  1988  . Cystoscopy  2009    macroplastique inj  . Rectocele repair  10/15/2011    Procedure: POSTERIOR REPAIR (RECTOCELE);  Surgeon: Kathi Ludwig, MD;  Location: [facility];  Service: Urology;;  posterior vaginal  vault repair with sacral spinus repair with graft  . Cystoscopy  10/15/2011    Procedure: CYSTOSCOPY;  Surgeon: Kathi Ludwig, MD;  Location: [facility];  Service: Urology;  Laterality: N/A;  . Flexible sigmoidoscopy  02/28/2012    Procedure: FLEXIBLE SIGMOIDOSCOPY;  Surgeon: Willis Modena, MD;  Location: [facility];  Service: Endoscopy;  Laterality: N/A;  . Breast biopsy  1990    bilaterally  . Cataract extraction w/ intraocular lens implant    . Posterior fusion lumbar spine  05/09/2012    Family History  Problem Relation Age of Onset  . Osteoarthritis Sister   . Osteoarthritis Brother     History  Substance Use Topics  . Smoking status: Never Smoker   . Smokeless tobacco: Never Used  . Alcohol Use: No    OB History   Grav Para Term Preterm Abortions TAB SAB Ect Mult Living                  Review of Systems  Psychiatric/Behavioral: Positive for altered mental status.  All other systems reviewed and are negative.    Allergies  Ciprofloxacin; Neosporin; and Sulfa antibiotics  Home Medications   Current Outpatient Rx  Name  Route  Sig  Dispense  Refill  . acetaminophen (TYLENOL) 325 MG tablet   Oral   Take 650 mg by mouth every 6 (six) hours as needed. For pain         .  amiloride-hydrochlorothiazide (MODURETIC) 5-50 MG tablet   Oral   Take 0.5 tablets by mouth daily.         Marland Kitchen aspirin EC 81 MG tablet   Oral   Take 81 mg by mouth daily.         . calcium citrate-vitamin D (CITRACAL+D) 315-200 MG-UNIT per tablet   Oral   Take 2 tablets by mouth daily.         . cholecalciferol (VITAMIN D) 1000 UNITS tablet   Oral   Take 1,000 Units by mouth daily.         Marland Kitchen conjugated estrogens (PREMARIN) vaginal cream   Vaginal   Place 1 g vaginally 2 (two) times a week. Tuesdays and Fridays.         Marland Kitchen docusate sodium (COLACE) 100 MG capsule   Oral   Take 1 capsule (100 mg total) by mouth 2 (two) times daily.   10 capsule   0    . DULoxetine (CYMBALTA) 30 MG capsule   Oral   Take 1 capsule (30 mg total) by mouth daily.   30 capsule   0   . fexofenadine (ALLEGRA) 180 MG tablet   Oral   Take 180 mg by mouth daily.         . insulin aspart (NOVOLOG) 100 UNIT/ML injection   Subcutaneous   Inject 8 Units into the skin 3 (three) times daily before meals.         . insulin glargine (LANTUS) 100 UNIT/ML injection   Subcutaneous   Inject 30 Units into the skin at bedtime.         . iron polysaccharides (NIFEREX) 150 MG capsule   Oral   Take 150 mg by mouth 2 (two) times daily.         Marland Kitchen lidocaine (LIDODERM) 5 %   Transdermal   Place 1 patch onto the skin daily. Remove & Discard patch within 12 hours or as directed by MD         . LORazepam (ATIVAN) 0.5 MG tablet   Oral   Take 1 tablet (0.5 mg total) by mouth every 12 (twelve) hours. For anxiety.   10 tablet   0   . morphine (MS CONTIN) 30 MG 12 hr tablet   Oral   Take 30 mg by mouth 2 (two) times daily.         . Nutritional Supplements (NUTRITIONAL DRINK PO)   Oral   Take 237 mLs by mouth 3 (three) times daily.         Marland Kitchen olopatadine (PATANOL) 0.1 % ophthalmic solution   Both Eyes   Place 1 drop into both eyes 2 (two) times daily as needed for allergies.          Marland Kitchen omeprazole (PRILOSEC) 20 MG capsule   Oral   Take 20 mg by mouth daily.         . ondansetron (ZOFRAN ODT) 8 MG disintegrating tablet   Oral   Take 1 tablet (8 mg total) by mouth every 8 (eight) hours as needed for nausea.   20 tablet   0   . oxybutynin (DITROPAN) 5 MG tablet   Oral   Take 5 mg by mouth 2 (two) times daily.         Marland Kitchen oxyCODONE (OXY IR/ROXICODONE) 5 MG immediate release tablet   Oral   Take 5 mg by mouth every 4 (four) hours as needed for pain.         Marland Kitchen  polyethylene glycol (MIRALAX / GLYCOLAX) packet   Oral   Take 17 g by mouth daily as needed (for constipation).          . potassium chloride SA (K-DUR,KLOR-CON) 20 MEQ tablet    Oral   Take 20 mEq by mouth 3 (three) times daily.         . promethazine (PHENERGAN) 12.5 MG tablet   Oral   Take 12.5 mg by mouth every 6 (six) hours as needed for nausea.         Marland Kitchen senna-docusate (SENOKOT-S) 8.6-50 MG per tablet   Oral   Take 2 tablets by mouth at bedtime.   60 tablet   0     BP 148/123  Pulse 124  Temp(Src) 97.6 F (36.4 C) (Axillary)  Resp 20  SpO2 100%  Physical Exam  Nursing note and vitals reviewed. Constitutional: She appears well-developed and well-nourished.  Somnolent too obtunded, difficult to arouse  HENT:  Head: Normocephalic and atraumatic.  Mouth/Throat: No oropharyngeal exudate.  Patient clench  jaw when trying to open mouth, mucous membranes appear moist, no obvious trauma or bleeding  Eyes: Conjunctivae and EOM are normal. Pupils are equal, round, and reactive to light. Right eye exhibits no discharge. Left eye exhibits no discharge. No scleral icterus.  Close  eyelids when I attempt to open them  Neck: Normal range of motion. Neck supple. No JVD present. No thyromegaly present.  Cardiovascular: Normal rate, regular rhythm, normal heart sounds and intact distal pulses.  Exam reveals no gallop and no friction rub.   No murmur heard. Pulmonary/Chest: Effort normal and breath sounds normal. No respiratory distress. She has no wheezes. She has no rales.  Abdominal: Soft. Bowel sounds are normal. She exhibits no distension and no mass. There is no tenderness.  Mild tympanitic sounds to percussion across the mid abdomen, no guarding, no peritoneal signs, soft  Musculoskeletal: Normal range of motion. She exhibits no edema and no tenderness.  Resists active flexion and extension at the knee and the hip  Lymphadenopathy:    She has no cervical adenopathy.  Neurological: She is alert. Coordination normal.  Attempts to squeeze my hand on both sides with just very weak attempt, unable to raise arms, unable to raise legs, no obvious facial  droop, minimal opening of the mouth with attempt  Skin: Skin is warm and dry. No rash noted. No erythema.  No rashes, warm and dry  Psychiatric: She has a normal mood and affect. Her behavior is normal.    ED Course  Procedures (including critical care time)  Labs Reviewed  CBC WITH DIFFERENTIAL - Abnormal; Notable for the following:    RBC 5.16 (*)    All other components within normal limits  COMPREHENSIVE METABOLIC PANEL - Abnormal; Notable for the following:    Sodium 133 (*)    Chloride 95 (*)    Glucose, Bld 116 (*)    GFR calc non Af Amer 82 (*)    All other components within normal limits  URINALYSIS, ROUTINE W REFLEX MICROSCOPIC - Abnormal; Notable for the following:    APPearance HAZY (*)    Nitrite POSITIVE (*)    All other components within normal limits  URINE MICROSCOPIC-ADD ON - Abnormal; Notable for the following:    Bacteria, UA MANY (*)    All other components within normal limits  GLUCOSE, CAPILLARY - Abnormal; Notable for the following:    Glucose-Capillary 109 (*)    All other components within  normal limits  POCT I-STAT, CHEM 8 - Abnormal; Notable for the following:    Chloride 94 (*)    Glucose, Bld 116 (*)    All other components within normal limits  CG4 I-STAT (LACTIC ACID)   Ct Head Wo Contrast (only If Suspected Head Trauma And/or Pt Is On Anticoagulant)  01/13/2013   *RADIOLOGY REPORT*  Clinical Data: Patient found unresponsive.  CT HEAD WITHOUT CONTRAST  Technique:  Contiguous axial images were obtained from the base of the skull through the vertex without contrast.  Comparison: Head CT scan 12/03/2012.  Findings: No evidence of acute intracranial abnormality including infarct, hemorrhage, mass lesion, mass effect, midline shift or abnormal extra-axial fluid collection is identified.  There is some cortical atrophy and chronic microvascular ischemic change. Mucosal thickening in the maxillary sinuses, worse on the right, appears unchanged.  IMPRESSION:  No acute finding.  Stable compared to prior exam.   Original Report Authenticated By: Holley Dexter, M.D.   Dg Chest Portable 1 View  01/13/2013   *RADIOLOGY REPORT*  Clinical Data: Altered mental status.  PORTABLE CHEST - 1 VIEW  Comparison: Single view of the chest 12/20/2012.  Findings: Lung volumes are low with mild basilar atelectasis.  The lungs are otherwise clear.  Heart size is normal.  No pneumothorax or pleural effusion.  IMPRESSION: No acute finding.   Original Report Authenticated By: Holley Dexter, M.D.     1. Encephalopathy acute       MDM  The patient is encephalopathic, she is mildly hypertensive but does not have a fever or tachycardia and there is nothing on her external exam that would suggest an obvious cause of her symptoms. Would consider metabolic infectious or intracranial abnormalities as a source, EKG, labs, cardiac monitoring, reevaluate.   ED ECG REPORT  I personally interpreted this EKG   Date: 01/13/2013   Rate: 95  Rhythm: normal sinus rhythm  QRS Axis: normal  Intervals: normal  ST/T Wave abnormalities: normal  Conduction Disutrbances:none  Narrative Interpretation:   Old EKG Reviewed: Compared with 12/20/2012, no significant changes    Discussed with the hospitalist for admission. Labs show essentially no acute findings, urinalysis with bacteria but no other leukocytes seen, positive nitrate. Possibly UTI. The patient was given Narcan and immediately became agitated, combative and started yelling. This speaks more to a narcotic sensitivity than to the urinary infection. The patient will be admitted to a medical surgical bed at the request of Dr. Edison Nasuti cone team 8.    Vida Roller, MD 01/13/13 1134

## 2013-01-13 NOTE — ED Notes (Signed)
Lactic acid results called to primary nurse Traci 

## 2013-01-13 NOTE — ED Notes (Signed)
Per EMS, pt from [facility]. sts this morning was found unresponsive in room after getting her up and dressing her. GCS 9. CBG 107. BP 180/90. Pulse 94. 20 RFA

## 2013-01-13 NOTE — ED Notes (Signed)
Awake and alert at this time. Pt oriented to situation, place, and person but disoriented to time. Pts family at the bedside. Pt complains of pain and is moaning.

## 2013-01-14 LAB — GLUCOSE, CAPILLARY
Glucose-Capillary: 172 mg/dL — ABNORMAL HIGH (ref 70–99)
Glucose-Capillary: 128 mg/dL — ABNORMAL HIGH (ref 70–99)

## 2013-01-14 LAB — CBC
MCH: 26.3 pg (ref 26.0–34.0)
MCHC: 32.4 g/dL (ref 30.0–36.0)
MCV: 81.2 fL (ref 78.0–100.0)
Platelets: 261 10*3/uL (ref 150–400)
RDW: 15.4 % (ref 11.5–15.5)

## 2013-01-14 LAB — BASIC METABOLIC PANEL
Calcium: 8.9 mg/dL (ref 8.4–10.5)
Creatinine, Ser: 0.7 mg/dL (ref 0.50–1.10)
GFR calc Af Amer: >90 mL/min (ref >90)

## 2013-01-14 NOTE — Progress Notes (Signed)
Subjective: She had a fairly good night, denies back pain does c/o low pelvic pain mile  Objective: Vital signs in last 24 hours: Temp:  [97.6 F (36.4 C)-99 F (37.2 C)] 98.7 F (37.1 C) (06/07 0543) Pulse Rate:  [87-134] 93 (06/07 0543) Resp:  [16-21] 16 (06/07 0543) BP: (132-191)/(63-123) 144/75 mmHg (06/07 0543) SpO2:  [98 %-100 %] 100 % (06/07 0543) Weight change:  Last BM Date: 01/13/13  Intake/Output from previous day:   Intake/Output this shift:    Resp: clear to auscultation bilaterally Cardio: regular rate and rhythm, S1, S2 normal, no murmur, click, rub or gallop GI: soft, non-tender; bowel sounds normal; no masses,  no organomegaly Neurologic: Mental status: Alert, oriented, thought content appropriate  Lab Results:  Recent Labs  01/13/13 0829 01/13/13 0843 01/14/13 0605  WBC 6.7  --  6.2  HGB 14.0 15.0 13.6  HCT 41.3 44.0 42.0  PLT 252  --  261   BMET  Recent Labs  01/13/13 0829 01/13/13 0843  NA 133* 135  K 3.6 3.6  CL 95* 94*  CO2 29  --   GLUCOSE 116* 116*  BUN 12 12  CREATININE 0.69 0.80  CALCIUM 9.5  --     Studies/Results: Ct Head Wo Contrast (only If Suspected Head Trauma And/or Pt Is On Anticoagulant)  01/13/2013   *RADIOLOGY REPORT*  Clinical Data: Patient found unresponsive.  CT HEAD WITHOUT CONTRAST  Technique:  Contiguous axial images were obtained from the base of the skull through the vertex without contrast.  Comparison: Head CT scan 12/03/2012.  Findings: No evidence of acute intracranial abnormality including infarct, hemorrhage, mass lesion, mass effect, midline shift or abnormal extra-axial fluid collection is identified.  There is some cortical atrophy and chronic microvascular ischemic change. Mucosal thickening in the maxillary sinuses, worse on the right, appears unchanged.  IMPRESSION: No acute finding.  Stable compared to prior exam.   Original Report Authenticated By: Holley Dexter, M.D.   Dg Chest Portable 1  View  01/13/2013   *RADIOLOGY REPORT*  Clinical Data: Altered mental status.  PORTABLE CHEST - 1 VIEW  Comparison: Single view of the chest 12/20/2012.  Findings: Lung volumes are low with mild basilar atelectasis.  The lungs are otherwise clear.  Heart size is normal.  No pneumothorax or pleural effusion.  IMPRESSION: No acute finding.   Original Report Authenticated By: Holley Dexter, M.D.    Medications:  Scheduled: . aspirin EC  81 mg Oral Daily  . cefTRIAXone (ROCEPHIN)  IV  1 g Intravenous Q24H  . docusate sodium  100 mg Oral BID  . DULoxetine  30 mg Oral Daily  . enoxaparin (LOVENOX) injection  30 mg Subcutaneous Q24H  . insulin aspart  0-9 Units Subcutaneous TID WC  . insulin glargine  20 Units Subcutaneous QHS  . iron polysaccharides  150 mg Oral BID  . lidocaine  1 patch Transdermal Q24H  . morphine  15 mg Oral BID  . oxybutynin  5 mg Oral BID  . pantoprazole  40 mg Oral Daily  . senna-docusate  2 tablet Oral QHS    Assessment/Plan: 1. Delirium improved with treatment of uti, She is interested in limiting medications, I will d/c oxybutinin to avoid anticholinergic side effects   LOS: 1 day   [facility] 01/14/2013, 7:21 AM

## 2013-01-15 LAB — GLUCOSE, CAPILLARY
Glucose-Capillary: 149 mg/dL — ABNORMAL HIGH (ref 70–99)
Glucose-Capillary: 176 mg/dL — ABNORMAL HIGH (ref 70–99)
Glucose-Capillary: 168 mg/dL — ABNORMAL HIGH (ref 70–99)

## 2013-01-15 LAB — URINE CULTURE: Colony Count: >=100000

## 2013-01-15 NOTE — Progress Notes (Signed)
Subjective: Frustrated she is not feeling better achy with urinary freq  Objective: Vital signs in last 24 hours: Temp:  [97.9 F (36.6 C)-98.4 F (36.9 C)] 98.3 F (36.8 C) (06/08 0537) Pulse Rate:  [67-88] 67 (06/08 0537) Resp:  [18] 18 (06/08 0537) BP: (156-172)/(58-69) 172/58 mmHg (06/08 0537) SpO2:  [98 %-99 %] 98 % (06/08 0537) Weight change:  Last BM Date: 01/13/13  Intake/Output from previous day: 06/07 0701 - 06/08 0700 In: 480 [P.O.:480] Out: -  Intake/Output this shift:    Resp: clear to auscultation bilaterally Cardio: regular rate and rhythm, S1, S2 normal, no murmur, click, rub or gallop GI: soft, non-tender; bowel sounds normal; no masses,  no organomegaly  Lab Results:  Recent Labs  01/13/13 0829 01/13/13 0843 01/14/13 0605  WBC 6.7  --  6.2  HGB 14.0 15.0 13.6  HCT 41.3 44.0 42.0  PLT 252  --  261   BMET  Recent Labs  01/13/13 0829 01/13/13 0843 01/14/13 0605  NA 133* 135 139  K 3.6 3.6 3.7  CL 95* 94* 102  CO2 29  --  30  GLUCOSE 116* 116* 92  BUN 12 12 10   CREATININE 0.69 0.80 0.70  CALCIUM 9.5  --  8.9    Studies/Results: Ct Head Wo Contrast (only If Suspected Head Trauma And/or Pt Is On Anticoagulant)  01/13/2013   *RADIOLOGY REPORT*  Clinical Data: Patient found unresponsive.  CT HEAD WITHOUT CONTRAST  Technique:  Contiguous axial images were obtained from the base of the skull through the vertex without contrast.  Comparison: Head CT scan 12/03/2012.  Findings: No evidence of acute intracranial abnormality including infarct, hemorrhage, mass lesion, mass effect, midline shift or abnormal extra-axial fluid collection is identified.  There is some cortical atrophy and chronic microvascular ischemic change. Mucosal thickening in the maxillary sinuses, worse on the right, appears unchanged.  IMPRESSION: No acute finding.  Stable compared to prior exam.   Original Report Authenticated By: Holley Dexter, M.D.   Dg Chest Portable 1  View  01/13/2013   *RADIOLOGY REPORT*  Clinical Data: Altered mental status.  PORTABLE CHEST - 1 VIEW  Comparison: Single view of the chest 12/20/2012.  Findings: Lung volumes are low with mild basilar atelectasis.  The lungs are otherwise clear.  Heart size is normal.  No pneumothorax or pleural effusion.  IMPRESSION: No acute finding.   Original Report Authenticated By: Holley Dexter, M.D.    Medications:  Scheduled: . aspirin EC  81 mg Oral Daily  . cefTRIAXone (ROCEPHIN)  IV  1 g Intravenous Q24H  . docusate sodium  100 mg Oral BID  . DULoxetine  30 mg Oral Daily  . enoxaparin (LOVENOX) injection  30 mg Subcutaneous Q24H  . insulin aspart  0-9 Units Subcutaneous TID WC  . insulin glargine  20 Units Subcutaneous QHS  . iron polysaccharides  150 mg Oral BID  . lidocaine  1 patch Transdermal Q24H  . morphine  15 mg Oral BID  . pantoprazole  40 mg Oral Daily  . senna-docusate  2 tablet Oral QHS    Assessment/Plan: 1. Mental status much better oriented to place and time 2. She needs to get up and I will change IV to saline lock 3. UTI on ab culture pending   LOS: 2 days   [facility] 01/15/2013, 7:43 AM

## 2013-01-16 LAB — GLUCOSE, CAPILLARY
Glucose-Capillary: 146 mg/dL — ABNORMAL HIGH (ref 70–99)
Glucose-Capillary: 202 mg/dL — ABNORMAL HIGH (ref 70–99)

## 2013-01-16 NOTE — Progress Notes (Signed)
Subjective: Feels better  Objective: Vital signs in last 24 hours: Temp:  [97.1 F (36.2 C)-97.9 F (36.6 C)] 97.9 F (36.6 C) (06/09 0532) Pulse Rate:  [70-78] 78 (06/09 0532) Resp:  [18] 18 (06/09 0532) BP: (164-177)/(64-70) 171/70 mmHg (06/09 0532) SpO2:  [98 %-100 %] 100 % (06/09 0532) Weight:  [72.122 kg (159 lb)] 72.122 kg (159 lb) (06/09 0300) Weight change:  Last BM Date: 01/13/13  Intake/Output from previous day: 06/08 0701 - 06/09 0700 In: 730 [P.O.:730] Out: -  Intake/Output this shift: Total I/O In: 250 [P.O.:250] Out: -   General appearance: alert and cooperative Resp: clear to auscultation bilaterally Cardio: regular rate and rhythm, S1, S2 normal, no murmur, click, rub or gallop Extremities: extremities normal, atraumatic, no cyanosis or edema  Lab Results:  Recent Labs  01/13/13 0829 01/13/13 0843 01/14/13 0605  WBC 6.7  --  6.2  HGB 14.0 15.0 13.6  HCT 41.3 44.0 42.0  PLT 252  --  261   BMET  Recent Labs  01/13/13 0829 01/13/13 0843 01/14/13 0605  NA 133* 135 139  K 3.6 3.6 3.7  CL 95* 94* 102  CO2 29  --  30  GLUCOSE 116* 116* 92  BUN 12 12 10   CREATININE 0.69 0.80 0.70  CALCIUM 9.5  --  8.9    Studies/Results: No results found.  Medications: I have reviewed the patient's current medications.  Assessment/Plan: 1. Mental status back at baseline. Lower dose morphine and oxybutinin held 2. Klebsiella UTI, continue on rocephin (allergies to cipro and sulfa listed) 3. Chronic Back Pain, morphine dose reduced.  Senekot, and miralax prn 4. Diabletes mellitus, lantus and SSI, controlled 5. Urinary incontinence, oxybutinin on hold 6.  PT today, OOB, possbile discharge in am     LOS: 3 days   Anie Juniel JOSEPH 01/16/2013, 6:23 AM

## 2013-01-16 NOTE — Clinical Social Work Psychosocial (Signed)
     Clinical Social Work Department BRIEF PSYCHOSOCIAL ASSESSMENT 01/16/2013  Patient:  JOYCELINE, [name]     Account Number:  [id number]     Admit date:  01/13/2013  Clinical Social Worker:  Hulan Fray  Date/Time:  01/16/2013 11:20 AM  Referred by:  RN  Date Referred:  01/16/2013 Referred for  Other - See comment   Other Referral:   Admit from facility   Interview type:  Patient Other interview type:   Husband- [name]    PSYCHOSOCIAL DATA Living Status:  FACILITY Admitted from facility:  [facility] Level of care:  Assisted Living Primary support name:  [name] Primary support relationship to patient:  SPOUSE Degree of support available:   supportive    CURRENT CONCERNS Current Concerns  Post-Acute Placement   Other Concerns:    SOCIAL WORK ASSESSMENT / PLAN Clinical Social Worker received referral for patient being admitted from ALF. CSW introduced self and explained reason for visit. Patient's husband was at bedside. Husband reported that patient is a resident at [facility] and the contact there is [name] [phone]). CSW called facility and left message for her to return call. Husband reported that patient will be transported via ambulance when medically stable.    CSW will complete FL2 for MD's signature and will facilitate discharge back to facility when medically stable.   Assessment/plan status:  Psychosocial Support/Ongoing Assessment of Needs Other assessment/ plan:   Information/referral to community resources:   Patient is from ALF  [facility]    PATIENTS/FAMILYS RESPONSE TO PLAN OF CARE: Per husband, plan is for patient to return at discharged. Husband was appreciative of CSW's visit and assistance.

## 2013-01-16 NOTE — Progress Notes (Signed)
Physical Therapy Evaluation Patient Details Name: [name] MRN: [id number] DOB: 1932/11/26 Today's Date: 01/16/2013 Time: 4098-1191 PT Time Calculation (min): 40 min  PT Assessment / Plan / Recommendation Clinical Impression  79 yo admitted with AMS; Presents overall moving well, but with decr activity tolerance; Will benefit from PT to maximize independence and safety with mobility prior to dc back to ALF    PT Assessment  Patient needs continued PT services    Follow Up Recommendations  Home health PT    Does the patient have the potential to tolerate intense rehabilitation      Barriers to Discharge None      Equipment Recommendations  Rolling walker with 5" wheels (3in1)    Recommendations for Other Services     Frequency Min 3X/week    Precautions / Restrictions Precautions Precautions: Fall   Pertinent Vitals/Pain no apparent distress       Mobility  Bed Mobility Bed Mobility: Supine to Sit;Sitting - Scoot to Edge of Bed Supine to Sit: 5: Supervision Sitting - Scoot to Edge of Bed: 5: Supervision Details for Bed Mobility Assistance: Pretty smooth transitions Transfers Transfers: Sit to Stand;Stand to Sit Sit to Stand: 4: Min guard Stand to Sit: 4: Min guard Details for Transfer Assistance: Cues for safety, control, hand placement Ambulation/Gait Ambulation/Gait Assistance: 4: Min guard Ambulation Distance (Feet): 75 Feet Assistive device: Rolling walker Ambulation/Gait Assistance Details: Cues for RW use and proximity and to self-monitor for activity tolerance Gait Pattern: Decreased stride length;Trunk flexed    Exercises     PT Diagnosis: Difficulty walking  PT Problem List: Decreased activity tolerance;Decreased balance;Decreased mobility PT Treatment Interventions: DME instruction;Gait training;Functional mobility training;Therapeutic activities;Therapeutic exercise;Balance training;Patient/family education   PT Goals Acute Rehab PT  Goals PT Goal Formulation: With patient Time For Goal Achievement: 01/23/13 Potential to Achieve Goals: Good Pt will go Supine/Side to Sit: Independently PT Goal: Supine/Side to Sit - Progress: Goal set today Pt will go Sit to Supine/Side: Independently PT Goal: Sit to Supine/Side - Progress: Goal set today Pt will go Sit to Stand: with modified independence PT Goal: Sit to Stand - Progress: Goal set today Pt will go Stand to Sit: with modified independence PT Goal: Stand to Sit - Progress: Goal set today Pt will Ambulate: >150 feet;with modified independence;with rolling walker PT Goal: Ambulate - Progress: Goal set today  Visit Information  Last PT Received On: 01/16/13 Assistance Needed: +1    Subjective Data  Subjective: Wants t get up and walk   Prior Functioning  Home Living Lives With: Other (Comment) Available Help at Discharge: Personal care attendant;Available PRN/intermittently (ALF) Type of Home: Assisted living Home Access: Level entry Home Layout: One level Home Adaptive Equipment: Walker - rolling Prior Function Level of Independence: Needs assistance Needs Assistance: Bathing;Meal Prep;Light Housekeeping Communication Communication: No difficulties    Cognition  Cognition Arousal/Alertness: Awake/alert Behavior During Therapy: WFL for tasks assessed/performed Overall Cognitive Status: No family/caregiver present to determine baseline cognitive functioning    Extremity/Trunk Assessment Right Upper Extremity Assessment RUE ROM/Strength/Tone: [facility] for tasks assessed Left Upper Extremity Assessment LUE ROM/Strength/Tone: WFL for tasks assessed Right Lower Extremity Assessment RLE ROM/Strength/Tone: WFL for tasks assessed Left Lower Extremity Assessment LLE ROM/Strength/Tone: WFL for tasks assessed Trunk Assessment Trunk Assessment: Kyphotic   Balance    End of Session PT - End of Session Activity Tolerance: Patient tolerated treatment well Patient  left: in chair;with call bell/phone within reach Nurse Communication: Mobility status  GP  Van Clines [place], [place] [phone]  01/16/2013, 4:41 PM

## 2013-01-17 LAB — GLUCOSE, CAPILLARY
Glucose-Capillary: 127 mg/dL — ABNORMAL HIGH (ref 70–99)
Glucose-Capillary: 136 mg/dL — ABNORMAL HIGH (ref 70–99)
Glucose-Capillary: 168 mg/dL — ABNORMAL HIGH (ref 70–99)
Glucose-Capillary: 175 mg/dL — ABNORMAL HIGH (ref 70–99)
Glucose-Capillary: 186 mg/dL — ABNORMAL HIGH (ref 70–99)
Glucose-Capillary: 268 mg/dL — ABNORMAL HIGH (ref 70–99)

## 2013-01-17 MED ORDER — AMILORIDE HCL 5 MG PO TABS
2.5000 mg | ORAL_TABLET | Freq: Every day | ORAL | Status: DC
  Administered 2013-01-17 – 2013-01-18 (×2): 2.5 mg via ORAL
  Filled 2013-01-17 (×4): qty 1

## 2013-01-17 MED ORDER — INSULIN GLARGINE 100 UNIT/ML ~~LOC~~ SOLN
25.0000 [IU] | Freq: Every day | SUBCUTANEOUS | Status: DC
  Administered 2013-01-17: 25 [IU] via SUBCUTANEOUS
  Filled 2013-01-17 (×2): qty 0.25

## 2013-01-17 MED ORDER — HYDROCHLOROTHIAZIDE 25 MG PO TABS
25.0000 mg | ORAL_TABLET | Freq: Every day | ORAL | Status: DC
  Administered 2013-01-17 – 2013-01-18 (×2): 25 mg via ORAL
  Filled 2013-01-17 (×2): qty 1

## 2013-01-17 NOTE — Progress Notes (Signed)
Pt ambulated 195ft today

## 2013-01-17 NOTE — Progress Notes (Signed)
Subjective: Still feels weak on her feet, loose bowel movement this am  Objective: Vital signs in last 24 hours: Temp:  [98.1 F (36.7 C)-98.6 F (37 C)] 98.6 F (37 C) (06/10 0428) Pulse Rate:  [72-84] 72 (06/10 0428) Resp:  [15-18] 16 (06/10 0428) BP: (138-168)/(48-78) 150/78 mmHg (06/10 0428) SpO2:  [98 %-100 %] 100 % (06/10 0428) Weight change:  Last BM Date: 01/13/13  Intake/Output from previous day: 06/09 0701 - 06/10 0700 In: 60 [P.O.:60] Out: -  Intake/Output this shift: Total I/O In: 60 [P.O.:60] Out: -   General appearance: alert and cooperative Resp: clear to auscultation bilaterally Cardio: regular rate and rhythm, S1, S2 normal, no murmur, click, rub or gallop  Lab Results: No results found for this basename: WBC, HGB, HCT, PLT,  in the last 72 hours BMET No results found for this basename: NA, K, CL, CO2, GLUCOSE, BUN, CREATININE, CALCIUM,  in the last 72 hours  Studies/Results: No results found.  Medications: I have reviewed the patient's current medications.  Assessment/Plan: 1. Mental status back at baseline. Lower dose morphine and oxybutinin held  2. Klebsiella UTI, continue on rocephin (allergies to cipro and sulfa listed)  3. Chronic Back Pain, morphine dose reduced. Senekot, and miralax prn  4. Diabletes mellitus, control fair, increase lantus and SSI, controlled  5. Urinary incontinence, oxybutinin on hold  6. Hypertension, restart diuretic 7. Loose stool, D/C senekot and colace, recheck C Diff 8. One more day for strengthening, plan D/C to Assisted Living in AM   LOS: 4 days   Aneisha Skyles JOSEPH 01/17/2013, 6:19 AM

## 2013-01-18 LAB — GLUCOSE, CAPILLARY
Glucose-Capillary: 146 mg/dL — ABNORMAL HIGH (ref 70–99)
Glucose-Capillary: 189 mg/dL — ABNORMAL HIGH (ref 70–99)

## 2013-01-18 MED ORDER — POTASSIUM CHLORIDE CRYS ER 20 MEQ PO TBCR: 20.0000 meq | EXTENDED_RELEASE_TABLET | Freq: Every day | ORAL | Status: AC

## 2013-01-18 MED ORDER — MORPHINE SULFATE ER 15 MG PO TBCR: 15.0000 mg | EXTENDED_RELEASE_TABLET | Freq: Two times a day (BID) | ORAL | Status: DC

## 2013-01-18 NOTE — Discharge Summary (Signed)
Physician Discharge Summary  Patient ID: [name] MRN: [id number] DOB/AGE: 13-Aug-1932 79 y.o.  Admit date: 01/13/2013 Discharge date: 01/18/2013  Admission Diagnoses: Metabolic encephalopathy Urinary tract infection Diabetes mellitus type 2 Chronic low back pain Urinary incontinence  Discharge Diagnoses:  Active Problems: Metabolic encephalopathy Urinary tract infection   Diabetes mellitus   Low back pain radiating to left leg Urinary continence       Discharged Condition: good  Hospital Course: The patient was admitted on June 6 with decreased responsiveness in the morning. She was noted to be poorly responsive to ER for evaluation. In the ER CT scan of the head, lab work and chest x-ray unremarkable. UA did show UTI and she was started on Rocephin. She was given Narcan in the ER and was woke up and started writhing in pain. Her morphine dose was decreased from 30-50 mg twice a day. Oxybutynin was held. Her urine culture grew out Klebsiella. She completed 6 days of Rocephin. Her chronic back pain was under good control on a lower dose of morphine. She has some loose stools, C. difficile PCR was negative. Her laxatives were cut back. Diabetes remained under reasonable control. Seen by physical therapy who felt she was a good candidate for home PT. She ambulated 150 feet with a walker and the day prior to discharge.  Consults: None  Significant Diagnostic Studies: labs: At discharge sodium 139 potassium 3.7 , BUN 10, creatinine 0.7, CBC WBC 6.2 hemoglobin 13.6 and radiology: CXR: normal and CT scan: Brain, no acute findings  Treatments: antibiotics: ceftriaxone, analgesia: Morphine and therapies: PT  Discharge Exam: Blood pressure 150/51, pulse 71, temperature 97.7 F (36.5 C), temperature source Oral, resp. rate 18, height 5\' 5"  (1.651 m), weight 72.122 kg (159 lb), SpO2 100.00%. Resp: clear to auscultation bilaterally Cardio: regular rate and rhythm, S1, S2 normal, no murmur,  click, rub or gallop  Disposition: 03-Skilled Nursing Facility     Medication List    STOP taking these medications       docusate sodium 100 MG capsule  Commonly known as:  COLACE      TAKE these medications       acetaminophen 325 MG tablet  Commonly known as:  TYLENOL  Take 650 mg by mouth every 6 (six) hours as needed. For pain     amiloride-hydrochlorothiazide 5-50 MG tablet  Commonly known as:  MODURETIC  Take 0.5 tablets by mouth daily.     aspirin EC 81 MG tablet  Take 81 mg by mouth daily.     calcium citrate-vitamin D 315-200 MG-UNIT per tablet  Commonly known as:  CITRACAL+D  Take 2 tablets by mouth daily.     cholecalciferol 1000 UNITS tablet  Commonly known as:  VITAMIN D  Take 1,000 Units by mouth daily.     conjugated estrogens vaginal cream  Commonly known as:  PREMARIN  Place 1 g vaginally 2 (two) times a week. Tuesdays and Fridays.     DULoxetine 30 MG capsule  Commonly known as:  CYMBALTA  Take 1 capsule (30 mg total) by mouth daily.     fexofenadine 180 MG tablet  Commonly known as:  ALLEGRA  Take 180 mg by mouth daily.     insulin aspart 100 UNIT/ML injection  Commonly known as:  novoLOG  Inject 8 Units into the skin 3 (three) times daily before meals.     insulin glargine 100 UNIT/ML injection  Commonly known as:  LANTUS  Inject 30 Units into  the skin at bedtime.     iron polysaccharides 150 MG capsule  Commonly known as:  NIFEREX  Take 150 mg by mouth 2 (two) times daily.     lidocaine 5 %  Commonly known as:  LIDODERM  Place 1 patch onto the skin daily. Remove & Discard patch within 12 hours or as directed by MD     LORazepam 0.5 MG tablet  Commonly known as:  ATIVAN  Take 1 tablet (0.5 mg total) by mouth every 12 (twelve) hours. For anxiety.     morphine 15 MG 12 hr tablet  Commonly known as:  MS CONTIN  Take 1 tablet (15 mg total) by mouth 2 (two) times daily.     NUTRITIONAL DRINK PO  Take 237 mLs by mouth 3 (three)  times daily.     olopatadine 0.1 % ophthalmic solution  Commonly known as:  PATANOL  Place 1 drop into both eyes 2 (two) times daily as needed for allergies.     omeprazole 20 MG capsule  Commonly known as:  PRILOSEC  Take 20 mg by mouth daily.     ondansetron 8 MG disintegrating tablet  Commonly known as:  ZOFRAN ODT  Take 1 tablet (8 mg total) by mouth every 8 (eight) hours as needed for nausea.     oxybutynin 5 MG tablet  Commonly known as:  DITROPAN  Take 5 mg by mouth 2 (two) times daily.     oxyCODONE 5 MG immediate release tablet  Commonly known as:  Oxy IR/ROXICODONE  Take 5 mg by mouth every 4 (four) hours as needed for pain.     polyethylene glycol packet  Commonly known as:  MIRALAX / GLYCOLAX  Take 17 g by mouth daily as needed (for constipation).     potassium chloride SA 20 MEQ tablet  Commonly known as:  K-DUR,KLOR-CON  Take 1 tablet (20 mEq total) by mouth daily.     promethazine 12.5 MG tablet  Commonly known as:  PHENERGAN  Take 12.5 mg by mouth every 6 (six) hours as needed for nausea.     senna-docusate 8.6-50 MG per tablet  Commonly known as:  Senokot-S  Take 2 tablets by mouth at bedtime.           Follow-up Information   Follow up with Lillia Mountain, MD In 2 weeks.   Contact information:   [address], [address] [place]nie [place]rum[place] [employer] [place] [address] [phone]       Signed: Lillia Mountain 01/18/2013, 6:54 AM

## 2013-01-18 NOTE — Care Management Note (Signed)
  Page 1 of 1   01/18/2013     10:01:10 AM   CARE MANAGEMENT NOTE 01/18/2013  Patient:  LOWEN, MANSOURI   Account Number:  [id number]  Date Initiated:  01/18/2013  Documentation initiated by:  Ronny Flurry  Subjective/Objective Assessment:     Action/Plan:   Anticipated DC Date:  01/18/2013   Anticipated DC Plan:  ASSISTED LIVING / REST HOME         Choice offered to / List presented to:             Status of service:   Medicare Important Message given?   (If response is "NO", the following Medicare IM given date fields will be blank) Date Medicare IM given:   Date Additional Medicare IM given:    Discharge Disposition:    Per UR Regulation:    If discussed at Long Length of Stay Meetings, dates discussed:    Comments:  01-18-13 Patient discharging to  [facility] [place] today . Order for HHPT . Spoke with Rolanda Lundborg at [facility] [phone] .   Place has their own PT staff , Rolanda Lundborg instructed NCM to send a copy of the order for HHPT in patient's packet and patient will receive PT . Same done . Ronny Flurry RN BSN [phone]

## 2013-01-18 NOTE — Progress Notes (Signed)
Pt's husband was bedside when I arrived. Pt said she would be discharged shortly. Pt was tkful for visit. Marjory Lies Chaplain  01/18/13 1000  Clinical Encounter Type  Visited With Patient and family together

## 2013-01-18 NOTE — Clinical Social Work Note (Signed)
Clinical Social Worker facilitated discharge by contacting husband and facility, [facility]. Patient will be transported via ambulance. CSW will complete discharge packet and place with shadow chart. CSW will sign off, as social work intervention is no longer needed.   Rozetta Nunnery MSW, [employer] [phone]

## 2013-01-18 NOTE — Progress Notes (Addendum)
Report called to [facility], [place], receiving RN, unavailable, Andrey Campanile Rn taking report. Discharge instructions gone over with patient. Follow up appointment is to be made with primary care doctor. Home medications gone over with patient and facility. Patient verbalized understanding and will be discharged after lunch.  15:00 [facility] here to transport patient to [facility]

## 2013-03-31 ENCOUNTER — Encounter (INDEPENDENT_AMBULATORY_CARE_PROVIDER_SITE_OTHER): Payer: Medicare Other | Admitting: Ophthalmology

## 2013-03-31 DIAGNOSIS — I10 Essential (primary) hypertension: Secondary | ICD-10-CM

## 2013-03-31 DIAGNOSIS — E1139 Type 2 diabetes mellitus with other diabetic ophthalmic complication: Secondary | ICD-10-CM

## 2013-03-31 DIAGNOSIS — E11319 Type 2 diabetes mellitus with unspecified diabetic retinopathy without macular edema: Secondary | ICD-10-CM

## 2013-03-31 DIAGNOSIS — H35039 Hypertensive retinopathy, unspecified eye: Secondary | ICD-10-CM

## 2013-03-31 DIAGNOSIS — H43819 Vitreous degeneration, unspecified eye: Secondary | ICD-10-CM

## 2013-05-18 ENCOUNTER — Ambulatory Visit (INDEPENDENT_AMBULATORY_CARE_PROVIDER_SITE_OTHER): Payer: Medicare Other | Admitting: Podiatry

## 2013-05-18 ENCOUNTER — Encounter: Payer: Self-pay | Admitting: Podiatry

## 2013-05-18 VITALS — BP 158/82 | HR 75 | Resp 16

## 2013-05-18 DIAGNOSIS — M79609 Pain in unspecified limb: Secondary | ICD-10-CM

## 2013-05-18 DIAGNOSIS — B351 Tinea unguium: Secondary | ICD-10-CM

## 2013-05-18 NOTE — Progress Notes (Signed)
Subjective:     Patient ID: [name], female   DOB: 09-May-1933, 79 y.o.   MRN: [id number]  HPI please cut my toenails they are painful and too thick for me to do.   Review of Systems  All other systems reviewed and are negative.       Objective:   Physical Exam  Vitals reviewed. Musculoskeletal: Normal range of motion.  Neurological: She is alert.  Skin: Skin is warm.   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    Plan:     Cutting of nailbeds 1-5 bilateral no iatrogenic bleeding noted.

## 2013-05-27 ENCOUNTER — Emergency Department (HOSPITAL_COMMUNITY)
Admission: EM | Admit: 2013-05-27 | Discharge: 2013-05-27 | Disposition: A | Discharge status: Home / Self Care | Payer: Medicare Other | Attending: Emergency Medicine | Admitting: Emergency Medicine

## 2013-05-27 ENCOUNTER — Encounter (HOSPITAL_COMMUNITY): Payer: Self-pay | Admitting: Emergency Medicine

## 2013-05-27 DIAGNOSIS — G8929 Other chronic pain: Secondary | ICD-10-CM | POA: Insufficient documentation

## 2013-05-27 DIAGNOSIS — N390 Urinary tract infection, site not specified: Secondary | ICD-10-CM | POA: Insufficient documentation

## 2013-05-27 DIAGNOSIS — Z79899 Other long term (current) drug therapy: Secondary | ICD-10-CM | POA: Insufficient documentation

## 2013-05-27 DIAGNOSIS — Z7982 Long term (current) use of aspirin: Secondary | ICD-10-CM | POA: Insufficient documentation

## 2013-05-27 DIAGNOSIS — M129 Arthropathy, unspecified: Secondary | ICD-10-CM | POA: Insufficient documentation

## 2013-05-27 DIAGNOSIS — Z794 Long term (current) use of insulin: Secondary | ICD-10-CM | POA: Insufficient documentation

## 2013-05-27 DIAGNOSIS — G589 Mononeuropathy, unspecified: Secondary | ICD-10-CM | POA: Insufficient documentation

## 2013-05-27 DIAGNOSIS — F329 Major depressive disorder, single episode, unspecified: Secondary | ICD-10-CM | POA: Insufficient documentation

## 2013-05-27 DIAGNOSIS — F3289 Other specified depressive episodes: Secondary | ICD-10-CM | POA: Insufficient documentation

## 2013-05-27 DIAGNOSIS — E1149 Type 2 diabetes mellitus with other diabetic neurological complication: Secondary | ICD-10-CM | POA: Insufficient documentation

## 2013-05-27 DIAGNOSIS — M48061 Spinal stenosis, lumbar region without neurogenic claudication: Secondary | ICD-10-CM | POA: Insufficient documentation

## 2013-05-27 DIAGNOSIS — N393 Stress incontinence (female) (male): Secondary | ICD-10-CM | POA: Insufficient documentation

## 2013-05-27 LAB — URINALYSIS, ROUTINE W REFLEX MICROSCOPIC
Bilirubin Urine: NEGATIVE
Hgb urine dipstick: NEGATIVE
Ketones, ur: NEGATIVE mg/dL
Nitrite: POSITIVE — AB
Protein, ur: NEGATIVE mg/dL
Urobilinogen, UA: 0.2 mg/dL (ref 0.0–1.0)

## 2013-05-27 LAB — URINE MICROSCOPIC-ADD ON

## 2013-05-27 MED ORDER — DEXTROSE 5 % IV SOLN
1.0000 g | Freq: Once | INTRAVENOUS | Status: AC
  Administered 2013-05-27: 1 g via INTRAVENOUS
  Filled 2013-05-27: qty 10

## 2013-05-27 MED ORDER — MORPHINE SULFATE 4 MG/ML IJ SOLN
4.0000 mg | Freq: Once | INTRAMUSCULAR | Status: AC
  Administered 2013-05-27: 4 mg via INTRAVENOUS
  Filled 2013-05-27: qty 1

## 2013-05-27 MED ORDER — CEPHALEXIN 500 MG PO CAPS: 500.0000 mg | ORAL_CAPSULE | Freq: Four times a day (QID) | ORAL | Status: DC

## 2013-05-27 NOTE — ED Notes (Signed)
Patient  From [facility] with chronic back pain. Denies falling. Has hx of spinal stenosis, DJD, lumbar compression fx, GI bleed. Complains that her back pain, right leg, right hip are bad.

## 2013-05-27 NOTE — ED Provider Notes (Signed)
Medical screening examination/treatment/procedure(s) were conducted as a shared visit with non-physician practitioner(s) and myself.  I personally evaluated the patient during the encounter  79 yo female with chronic back pain presenting with worsening back pain.  Denies falls or injuries.  Well appearing, not distressed on exam.  Shows some generalized, but nonfocal weakness.  No new component of her back pain, just worse than prior.  Plan to check urinalysis and ambulate.  Anticipate discharge.   Clinical Impression: 1. UTI (urinary tract infection)   2. Chronic lower back pain       Candyce Churn, MD 05/28/13 [phone]

## 2013-05-27 NOTE — ED Notes (Signed)
Bed: MW41 Expected date: 05/27/13 Expected time: 3:44 PM Means of arrival: Ambulance Comments: Chronic pain

## 2013-05-27 NOTE — ED Provider Notes (Signed)
CSN: [id number]     Arrival date & time 05/27/13  1539 History   First MD Initiated Contact with Patient 05/27/13 1541     No chief complaint on file.  (Consider location/radiation/quality/duration/timing/severity/associated sxs/prior Treatment) HPI Comments: Patient brought in today from [facility] with chronic back pain.  Patient with a history of of L3-L5 Decompressive Lumbar Laminectomy performed by Dr Newell Coral approximately one year ago.  She reports that she has had lower back pain for years.  She reports that her Primary Care Physician has been trying to decrease the dose of Morphine that she is currently on for her chronic back pain.  Spoke with the staff at the Nursing Home they report that the patient has been on 15 mg Morphine BID, but that has now been changed to 20 mg qd.  Staff report that the patient has not had a fever.  Staff also report that the patient has not had any recent falls.  Patient denies any new symptoms.  Denies numbness or tingling.    The history is provided by the patient.    Past Medical History  Diagnosis Date  . Incontinence of urine   . Diarrhea   . Neuropathy     bilateral feet  . GERD (gastroesophageal reflux disease)   . Depression   . Urinary, incontinence, stress female   . History of shingles 2009  . Degenerative lumbar spinal stenosis   . Type II diabetes mellitus     Type 2 IDDM  X 40 years;   . Arthritis     lower back/hands  . Altered mental status 01/13/2013   Past Surgical History  Procedure Laterality Date  . Incontinence surgery  2005  . Tonsillectomy  1964  . Abdominal hysterectomy  1982  . Ovarian cyst surgery  1972  . Cholecystectomy  1988  . Cystoscopy  2009    macroplastique inj  . Rectocele repair  10/15/2011    Procedure: POSTERIOR REPAIR (RECTOCELE);  Surgeon: Kathi Ludwig, MD;  Location: [facility];  Service: Urology;;  posterior vaginal vault repair with sacral spinus repair with  graft  . Cystoscopy  10/15/2011    Procedure: CYSTOSCOPY;  Surgeon: Kathi Ludwig, MD;  Location: [facility];  Service: Urology;  Laterality: N/A;  . Flexible sigmoidoscopy  02/28/2012    Procedure: FLEXIBLE SIGMOIDOSCOPY;  Surgeon: Willis Modena, MD;  Location: [facility];  Service: Endoscopy;  Laterality: N/A;  . Breast biopsy  1990    bilaterally  . Cataract extraction w/ intraocular lens implant    . Posterior fusion lumbar spine  05/09/2012   Family History  Problem Relation Age of Onset  . Osteoarthritis Sister   . Osteoarthritis Brother    History  Substance Use Topics  . Smoking status: Never Smoker   . Smokeless tobacco: Never Used  . Alcohol Use: No   OB History   Grav Para Term Preterm Abortions TAB SAB Ect Mult Living                 Review of Systems  Musculoskeletal: Positive for back pain.  All other systems reviewed and are negative.    Allergies  Ciprofloxacin; Neosporin; and Sulfa antibiotics  Home Medications   Current Outpatient Rx  Name  Route  Sig  Dispense  Refill  . acetaminophen (TYLENOL) 325 MG tablet   Oral   Take 650 mg by mouth every 6 (six) hours as needed. For pain         .  amiloride-hydrochlorothiazide (MODURETIC) 5-50 MG tablet   Oral   Take 0.5 tablets by mouth daily.         Marland Kitchen aspirin EC 81 MG tablet   Oral   Take 81 mg by mouth daily.         . calcium citrate-vitamin D (CITRACAL+D) 315-200 MG-UNIT per tablet   Oral   Take 2 tablets by mouth daily.         . cholecalciferol (VITAMIN D) 1000 UNITS tablet   Oral   Take 1,000 Units by mouth daily.         Marland Kitchen conjugated estrogens (PREMARIN) vaginal cream   Vaginal   Place 1 g vaginally 2 (two) times a week. Tuesdays and Fridays.         Marland Kitchen EXPIRED: DULoxetine (CYMBALTA) 30 MG capsule   Oral   Take 1 capsule (30 mg total) by mouth daily.   30 capsule   0   . fexofenadine (ALLEGRA) 180 MG tablet   Oral   Take 180 mg by mouth daily.          . insulin aspart (NOVOLOG) 100 UNIT/ML injection   Subcutaneous   Inject 8 Units into the skin 3 (three) times daily before meals.         . insulin glargine (LANTUS) 100 UNIT/ML injection   Subcutaneous   Inject 30 Units into the skin at bedtime.         . iron polysaccharides (NIFEREX) 150 MG capsule   Oral   Take 150 mg by mouth 2 (two) times daily.         Marland Kitchen lidocaine (LIDODERM) 5 %   Transdermal   Place 1 patch onto the skin daily. Remove & Discard patch within 12 hours or as directed by MD         . LORazepam (ATIVAN) 0.5 MG tablet   Oral   Take 1 tablet (0.5 mg total) by mouth every 12 (twelve) hours. For anxiety.   10 tablet   0   . morphine (MS CONTIN) 15 MG 12 hr tablet   Oral   Take 1 tablet (15 mg total) by mouth 2 (two) times daily.   60 tablet   0   . Nutritional Supplements (NUTRITIONAL DRINK PO)   Oral   Take 237 mLs by mouth 3 (three) times daily.         Marland Kitchen olopatadine (PATANOL) 0.1 % ophthalmic solution   Both Eyes   Place 1 drop into both eyes 2 (two) times daily as needed for allergies.          Marland Kitchen omeprazole (PRILOSEC) 20 MG capsule   Oral   Take 20 mg by mouth daily.         . ondansetron (ZOFRAN ODT) 8 MG disintegrating tablet   Oral   Take 1 tablet (8 mg total) by mouth every 8 (eight) hours as needed for nausea.   20 tablet   0   . oxybutynin (DITROPAN) 5 MG tablet   Oral   Take 5 mg by mouth 2 (two) times daily.         Marland Kitchen oxyCODONE (OXY IR/ROXICODONE) 5 MG immediate release tablet   Oral   Take 5 mg by mouth every 4 (four) hours as needed for pain.         . polyethylene glycol (MIRALAX / GLYCOLAX) packet   Oral   Take 17 g by mouth daily as needed (for constipation).          Marland Kitchen  potassium chloride SA (K-DUR,KLOR-CON) 20 MEQ tablet   Oral   Take 1 tablet (20 mEq total) by mouth daily.   30 tablet   11   . promethazine (PHENERGAN) 12.5 MG tablet   Oral   Take 12.5 mg by mouth every 6 (six) hours as  needed for nausea.          BP 172/68  Pulse 81  Temp(Src) 98.7 F (37.1 C) (Oral)  Resp 18  SpO2 99% Physical Exam  Nursing note and vitals reviewed. Constitutional: She appears well-developed and well-nourished.  HENT:  Head: Normocephalic and atraumatic.  Mouth/Throat: Oropharynx is clear and moist.  Neck: Normal range of motion. Neck supple.  Cardiovascular: Normal rate, regular rhythm and normal heart sounds.   Pulmonary/Chest: Effort normal and breath sounds normal.  Abdominal: Soft. Bowel sounds are normal. She exhibits no distension and no mass. There is no rebound and no guarding.  Mild suprapubic tenderness to palpation  Musculoskeletal: Normal range of motion.       Cervical back: She exhibits normal range of motion, no tenderness, no bony tenderness, no swelling, no edema and no deformity.       Thoracic back: She exhibits normal range of motion, no tenderness, no bony tenderness, no swelling, no edema and no deformity.       Lumbar back: She exhibits tenderness and bony tenderness. She exhibits normal range of motion, no swelling, no edema and no deformity.  Neurological: She is alert. She has normal strength. No sensory deficit.  Reflex Scores:      Patellar reflexes are 2+ on the right side and 2+ on the left side.      Achilles reflexes are 2+ on the right side and 2+ on the left side. Skin: Skin is warm and dry.  Psychiatric: She has a normal mood and affect.    ED Course  Procedures (including critical care time) Labs Review Labs Reviewed - No data to display Imaging Review No results found.  EKG Interpretation   None      5:47 PM Attempted to ambulate patient.  Patient with significant pain with ambulation.  Will order another dose of IV pain medication and reassess. 7:31 PM Ambulated patient.  Patient able to ambulate in the room. MDM  No diagnosis found. Patient with a history of chronic lower back pain presents today with lower back pain.  Her  Morphine dose has recently been decreased by her PCP, which is most likely the reason for her increased pain.  No recent falls or trauma.  Patient given IV pain medications and pain improved.  Patient able to ambulate in the room prior to discharge.  Patient also found to have a UTI.  Patient given dose of IV Rocephin in the ED and discharged home on Keflex.  Feel that the patient is stable for discharge back to Nursing Home.  Patient instructed to follow up with PCP regarding her chronic pain management.    Santiago Glad, PA-C 05/30/13 0005

## 2013-05-30 LAB — URINE CULTURE: Colony Count: >=100000

## 2013-05-31 NOTE — ED Provider Notes (Signed)
Medical screening examination/treatment/procedure(s) were conducted as a shared visit with non-physician practitioner(s) and myself.  I personally evaluated the patient during the encounter.   Please see my separate note.     Candyce Churn, MD 05/31/13 1134

## 2013-05-31 NOTE — ED Notes (Signed)
Copy of labs faxed to [facility]

## 2013-05-31 NOTE — Progress Notes (Signed)
ED Antimicrobial Stewardship Positive Culture Follow Up   [name] is an 79 y.o. female who presented to [facility] on 05/27/2013 with a chief complaint of  Chief Complaint  Patient presents with  . Back Pain    Recent Results (from the past 720 hour(s))  URINE CULTURE     Status: None   Collection Time    05/27/13  4:19 PM      Result Value Range Status   Specimen Description URINE, CATHETERIZED   Final   Special Requests NONE   Final   Culture  Setup Time     Final   Value: 05/27/2013 20:35     Performed at [facility] Count     Final   Value: >=100,000 COLONIES/ML     Performed at [facility]   Culture     Final   Value: ESCHERICHIA COLI     Performed at [facility]   Report Status 05/30/2013 FINAL   Final   Organism ID, Bacteria ESCHERICHIA COLI   Final    [x]  Treated with cephalexin, organism resistant to prescribed antimicrobial   New antibiotic prescription: Macrobid 100mg  BID x 7 days  Stop cephalexin  ED Provider: Junious Silk, PA   [place] 05/31/2013, 10:02 AM Infectious Diseases Pharmacist Phone# [phone]

## 2013-06-09 ENCOUNTER — Emergency Department (HOSPITAL_COMMUNITY)
Admission: EM | Admit: 2013-06-09 | Discharge: 2013-06-09 | Disposition: A | Discharge status: Home / Self Care | Payer: Medicare Other | Attending: Emergency Medicine | Admitting: Emergency Medicine

## 2013-06-09 ENCOUNTER — Encounter (HOSPITAL_COMMUNITY): Payer: Self-pay | Admitting: Emergency Medicine

## 2013-06-09 DIAGNOSIS — M79609 Pain in unspecified limb: Secondary | ICD-10-CM | POA: Insufficient documentation

## 2013-06-09 DIAGNOSIS — Z7982 Long term (current) use of aspirin: Secondary | ICD-10-CM | POA: Insufficient documentation

## 2013-06-09 DIAGNOSIS — G5790 Unspecified mononeuropathy of unspecified lower limb: Secondary | ICD-10-CM | POA: Insufficient documentation

## 2013-06-09 DIAGNOSIS — Z981 Arthrodesis status: Secondary | ICD-10-CM | POA: Insufficient documentation

## 2013-06-09 DIAGNOSIS — Z8619 Personal history of other infectious and parasitic diseases: Secondary | ICD-10-CM | POA: Insufficient documentation

## 2013-06-09 DIAGNOSIS — K219 Gastro-esophageal reflux disease without esophagitis: Secondary | ICD-10-CM | POA: Insufficient documentation

## 2013-06-09 DIAGNOSIS — Z794 Long term (current) use of insulin: Secondary | ICD-10-CM | POA: Insufficient documentation

## 2013-06-09 DIAGNOSIS — F329 Major depressive disorder, single episode, unspecified: Secondary | ICD-10-CM | POA: Insufficient documentation

## 2013-06-09 DIAGNOSIS — F3289 Other specified depressive episodes: Secondary | ICD-10-CM | POA: Insufficient documentation

## 2013-06-09 DIAGNOSIS — M4000 Postural kyphosis, site unspecified: Secondary | ICD-10-CM | POA: Insufficient documentation

## 2013-06-09 DIAGNOSIS — G8929 Other chronic pain: Secondary | ICD-10-CM | POA: Insufficient documentation

## 2013-06-09 DIAGNOSIS — Z8742 Personal history of other diseases of the female genital tract: Secondary | ICD-10-CM | POA: Insufficient documentation

## 2013-06-09 DIAGNOSIS — M129 Arthropathy, unspecified: Secondary | ICD-10-CM | POA: Insufficient documentation

## 2013-06-09 DIAGNOSIS — E119 Type 2 diabetes mellitus without complications: Secondary | ICD-10-CM | POA: Insufficient documentation

## 2013-06-09 DIAGNOSIS — Z79899 Other long term (current) drug therapy: Secondary | ICD-10-CM | POA: Insufficient documentation

## 2013-06-09 LAB — GLUCOSE, CAPILLARY

## 2013-06-09 MED ORDER — HYDROCODONE-ACETAMINOPHEN 5-325 MG PO TABS: 1.0000 | ORAL_TABLET | Freq: Once | ORAL | Status: DC

## 2013-06-09 MED ORDER — OXYCODONE-ACETAMINOPHEN 5-325 MG PO TABS
1.0000 | ORAL_TABLET | Freq: Once | ORAL | Status: AC
  Administered 2013-06-09: 1 via ORAL
  Filled 2013-06-09: qty 1

## 2013-06-09 NOTE — ED Provider Notes (Addendum)
CSN: [id number]     Arrival date & time 06/09/13  4098 History   First MD Initiated Contact with Patient 06/09/13 [phone]     Chief Complaint  Patient presents with  . Leg Pain   (Consider location/radiation/quality/duration/timing/severity/associated sxs/prior Treatment) HPI Complains of right anterior thigh pain and left shin pain onset this morning. Husband reports the patient suffers from chronic pain. She was reportedly treated with morphine and Ativan this morning prior to coming here pain is improved at present. Pain is worse with walking or pressing on the area is mild at present. No other associated symptoms Past Medical History  Diagnosis Date  . Incontinence of urine   . Diarrhea   . Neuropathy     bilateral feet  . GERD (gastroesophageal reflux disease)   . Depression   . Urinary, incontinence, stress female   . History of shingles 2009  . Degenerative lumbar spinal stenosis   . Type II diabetes mellitus     Type 2 IDDM  X 40 years;   . Arthritis     lower back/hands  . Altered mental status 01/13/2013   spinal stenosis with neurogenic claudication Past Surgical History  Procedure Laterality Date  . Incontinence surgery  2005  . Tonsillectomy  1964  . Abdominal hysterectomy  1982  . Ovarian cyst surgery  1972  . Cholecystectomy  1988  . Cystoscopy  2009    macroplastique inj  . Rectocele repair  10/15/2011    Procedure: POSTERIOR REPAIR (RECTOCELE);  Surgeon: Kathi Ludwig, MD;  Location: [facility];  Service: Urology;;  posterior vaginal vault repair with sacral spinus repair with graft  . Cystoscopy  10/15/2011    Procedure: CYSTOSCOPY;  Surgeon: Kathi Ludwig, MD;  Location: [facility];  Service: Urology;  Laterality: N/A;  . Flexible sigmoidoscopy  02/28/2012    Procedure: FLEXIBLE SIGMOIDOSCOPY;  Surgeon: Willis Modena, MD;  Location: [facility];  Service: Endoscopy;  Laterality: N/A;  . Breast biopsy  1990   bilaterally  . Cataract extraction w/ intraocular lens implant    . Posterior fusion lumbar spine  05/09/2012   Family History  Problem Relation Age of Onset  . Osteoarthritis Sister   . Osteoarthritis Brother    History  Substance Use Topics  . Smoking status: Never Smoker   . Smokeless tobacco: Never Used  . Alcohol Use: No   OB History   Grav Para Term Preterm Abortions TAB SAB Ect Mult Living                 Review of Systems  Musculoskeletal: Positive for myalgias.  Neurological:       Walks with walker  All other systems reviewed and are negative.    Allergies  Ciprofloxacin; Neosporin; and Sulfa antibiotics  Home Medications   Current Outpatient Rx  Name  Route  Sig  Dispense  Refill  . acetaminophen (TYLENOL) 325 MG tablet   Oral   Take 650 mg by mouth every 6 (six) hours as needed. For pain         . amiloride-hydrochlorothiazide (MODURETIC) 5-50 MG tablet   Oral   Take 0.5 tablets by mouth daily.         Marland Kitchen aspirin EC 81 MG tablet   Oral   Take 81 mg by mouth daily.         . calcium citrate-vitamin D (CITRACAL+D) 315-200 MG-UNIT per tablet   Oral   Take 2 tablets by mouth  daily.         . cephALEXin (KEFLEX) 500 MG capsule   Oral   Take 1 capsule (500 mg total) by mouth 4 (four) times daily.   40 capsule   0   . cholecalciferol (VITAMIN D) 1000 UNITS tablet   Oral   Take 1,000 Units by mouth daily.         Marland Kitchen conjugated estrogens (PREMARIN) vaginal cream   Vaginal   Place 1 g vaginally 2 (two) times a week. Tuesdays and Fridays.         Marland Kitchen EXPIRED: DULoxetine (CYMBALTA) 30 MG capsule   Oral   Take 1 capsule (30 mg total) by mouth daily.   30 capsule   0   . fexofenadine (ALLEGRA) 180 MG tablet   Oral   Take 180 mg by mouth daily.         . insulin aspart (NOVOLOG) 100 UNIT/ML injection   Subcutaneous   Inject 8 Units into the skin 3 (three) times daily before meals.         . insulin glargine (LANTUS) 100 UNIT/ML  injection   Subcutaneous   Inject 30 Units into the skin at bedtime.         . iron polysaccharides (NIFEREX) 150 MG capsule   Oral   Take 150 mg by mouth 2 (two) times daily.         Marland Kitchen LORazepam (ATIVAN) 0.5 MG tablet   Oral   Take 1 tablet (0.5 mg total) by mouth every 12 (twelve) hours. For anxiety.   10 tablet   0   . morphine (KADIAN) 20 MG 24 hr capsule   Oral   Take 20 mg by mouth daily.         . Nutritional Supplements (NUTRITIONAL DRINK PO)   Oral   Take 237 mLs by mouth 3 (three) times daily.         Marland Kitchen olopatadine (PATANOL) 0.1 % ophthalmic solution   Both Eyes   Place 1 drop into both eyes 2 (two) times daily as needed for allergies.          Marland Kitchen omeprazole (PRILOSEC) 20 MG capsule   Oral   Take 20 mg by mouth daily.         . ondansetron (ZOFRAN ODT) 8 MG disintegrating tablet   Oral   Take 1 tablet (8 mg total) by mouth every 8 (eight) hours as needed for nausea.   20 tablet   0   . oxyCODONE (OXY IR/ROXICODONE) 5 MG immediate release tablet   Oral   Take 5 mg by mouth every 4 (four) hours as needed for pain.         . polyethylene glycol (MIRALAX / GLYCOLAX) packet   Oral   Take 17 g by mouth daily as needed (for constipation).          . potassium chloride SA (K-DUR,KLOR-CON) 20 MEQ tablet   Oral   Take 1 tablet (20 mEq total) by mouth daily.   30 tablet   11   . promethazine (PHENERGAN) 12.5 MG tablet   Oral   Take 12.5 mg by mouth every 6 (six) hours as needed for nausea.          BP 138/68  Pulse 76  Temp(Src) 98.8 F (37.1 C) (Oral)  Resp 24  SpO2 97% Physical Exam  Nursing note and vitals reviewed. Constitutional:  Chronically  HENT:  Head: Normocephalic and atraumatic.  Eyes: Conjunctivae  are normal. Pupils are equal, round, and reactive to light.  Neck: Neck supple. No tracheal deviation present. No thyromegaly present.  Cardiovascular: Normal rate and regular rhythm.   No murmur heard. Pulmonary/Chest:  Effort normal and breath sounds normal.  Abdominal: Soft. Bowel sounds are normal. She exhibits no distension. There is no tenderness.  Musculoskeletal: Normal range of motion. She exhibits no edema and no tenderness.  Thoracic kyphosis. Is stable nontender. No pain admits on internal rotation of either thigh. Both lower extremities without swelling redness or point tenderness. Neurovascular intact.  Neurological: She is alert. Coordination normal.  To walk with a walker with some pain in her legs. Upon walking  Skin: Skin is warm and dry. No rash noted.  Psychiatric: She has a normal mood and affect.   Results for orders placed during the hospital encounter of 06/09/13  GLUCOSE, CAPILLARY      Result Value Range   Glucose-Capillary 116 (*) 70 - 99 mg/dL   Comment 1 Notify RN     No results found.  ED Course  Procedures (including critical care time) Labs Review Labs Reviewed - No data to display Imaging Review No results found.  EKG Interpretation   None      Spoke with Ms Lorella Nimrod, nurse at assisted living. Patient suffers from chronic pain. She was reportedly yelling in pain this morning. Pain is worse after physical therapy Pain is improved at present. MDM  No diagnosis found. Patient suffers from chronic pain. Per discussion withMs harvey we suggest call Dr.Griffin for possible adjustment of medications. Also need family meeting to discuss goals of physical therapy. Diagnosis chronic leg pain    Doug Sou, MD 06/09/13 1059  Doug Sou, MD 06/09/13 1100  Doug Sou, MD 06/09/13 1100

## 2013-06-09 NOTE — ED Notes (Signed)
Bed: WA21 Expected date:  Expected time:  Means of arrival:  Comments: ems- 80 yo F, hx of lower extremity pain

## 2013-06-09 NOTE — ED Notes (Signed)
Per [facility] pt comes from [facility] c/o lower extremity pain. Pt has PMH spinal stenosis and lumbar fusion.  Pt was given morphine 20mg  and ativan 0.5mg  30 mins before [facility] arrived to facility.

## 2013-07-10 IMAGING — CT CT HEAD W/O CM
1 series · 16 of 30 positions shown, 20 images · non-contrast
Comparison: None.

CLINICAL DATA: Acute mental status changes.

CT HEAD WITHOUT CONTRAST
TECHNIQUE: Contiguous axial images were obtained from the base of
the skull through the vertex without contrast.

[Series 2: headseq 4.8 h45s · axial · 0.43mm/px · z∈[-120,+35]mm · 16 of 36 slices shown, 20 images]
[im 2/36  brain]
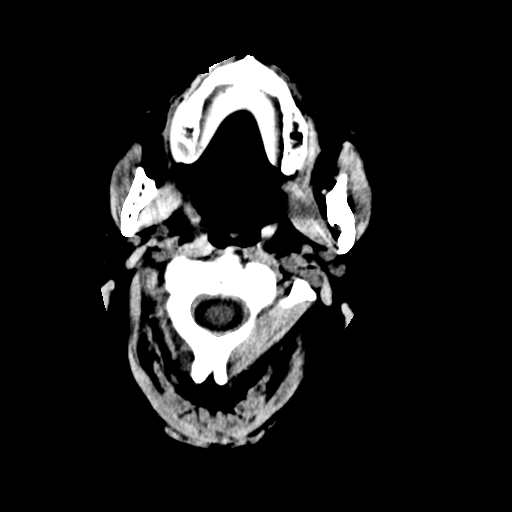
[im 2/36  bone]
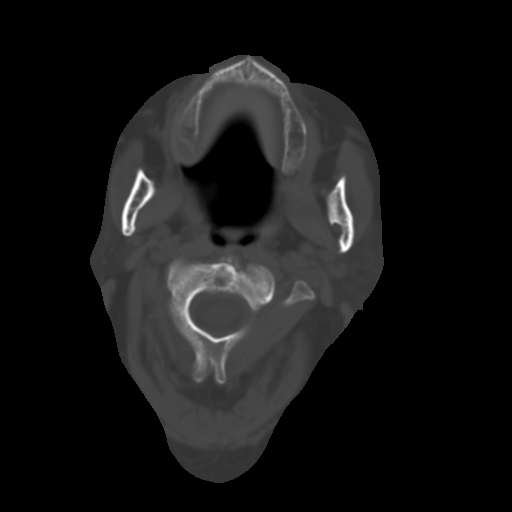
[im 4/36  brain]
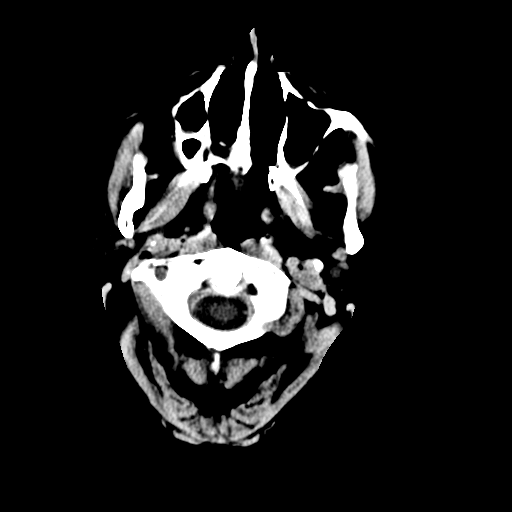
[im 7/36  brain]
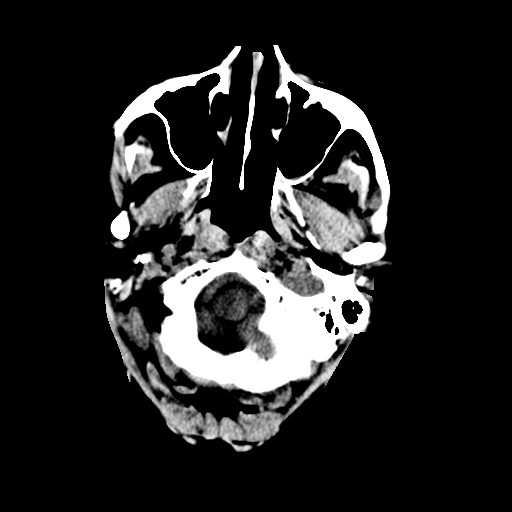
[im 9/36  brain]
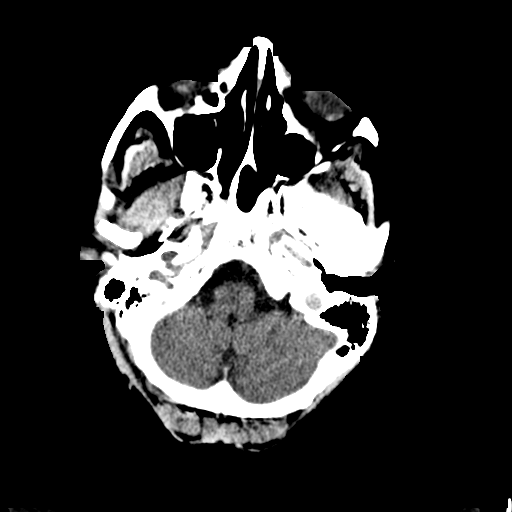
[im 10/36  brain]
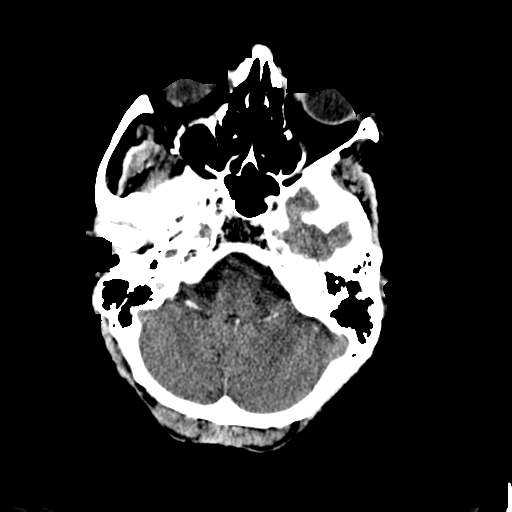
[im 10/36  bone]
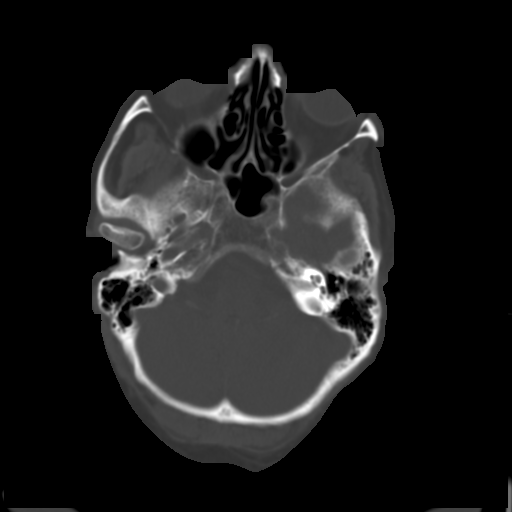
[im 13/36  brain]
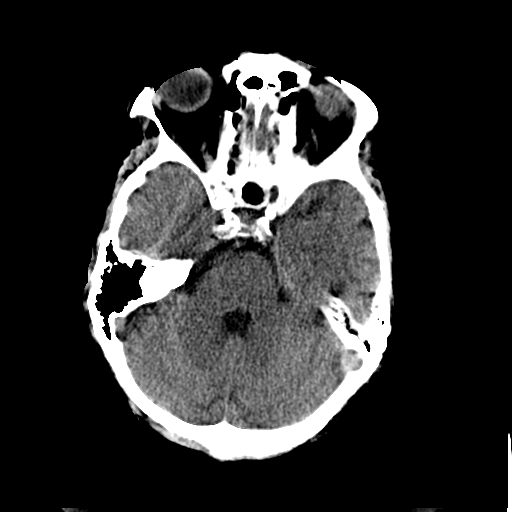
[im 15/36  brain]
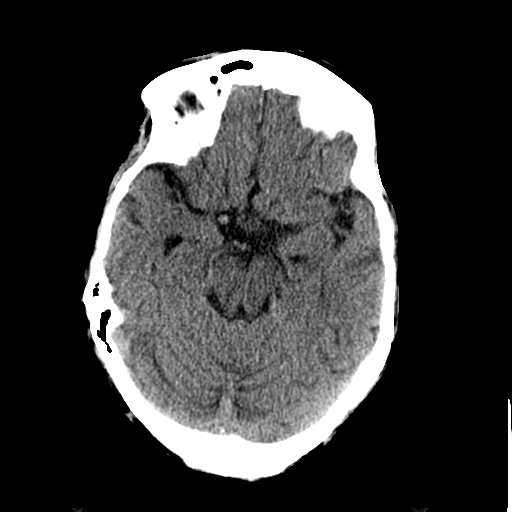
[im 17/36  brain]
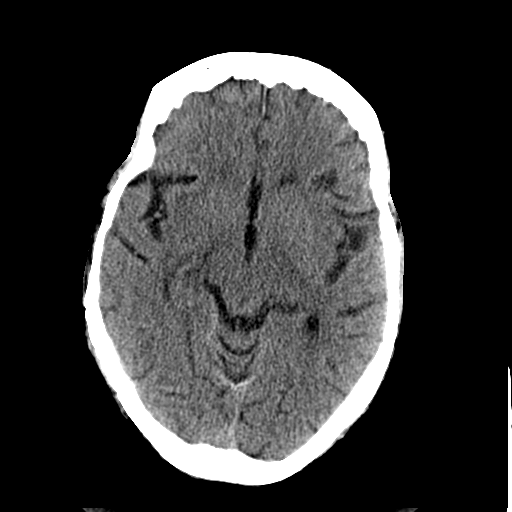
[im 19/36  brain]
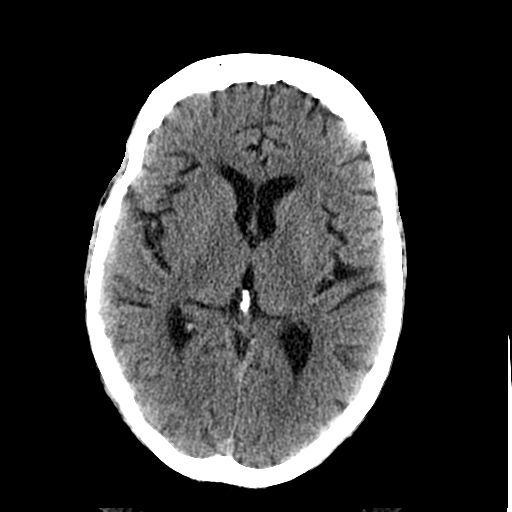
[im 19/36  bone]
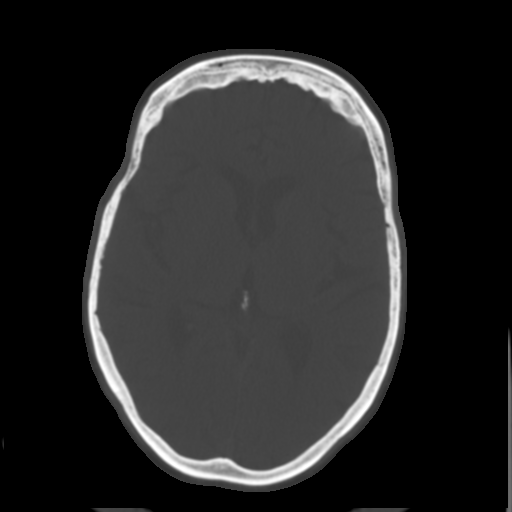
[im 21/36  brain]
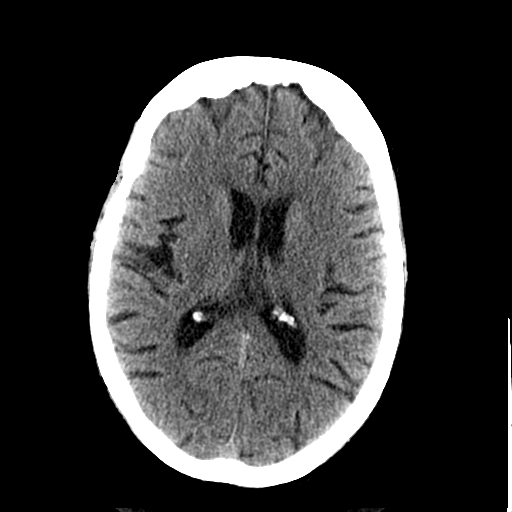
[im 23/36  brain]
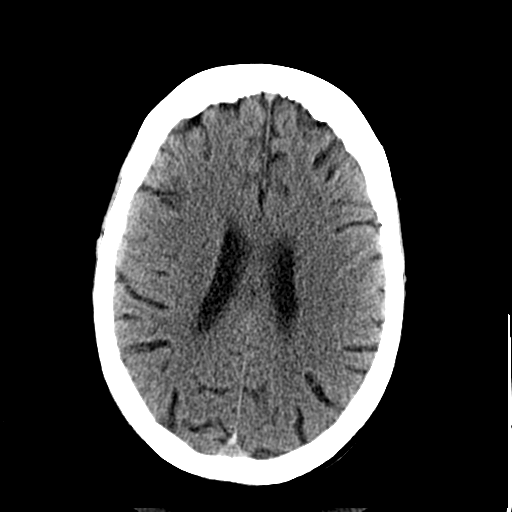
[im 26/36  brain]
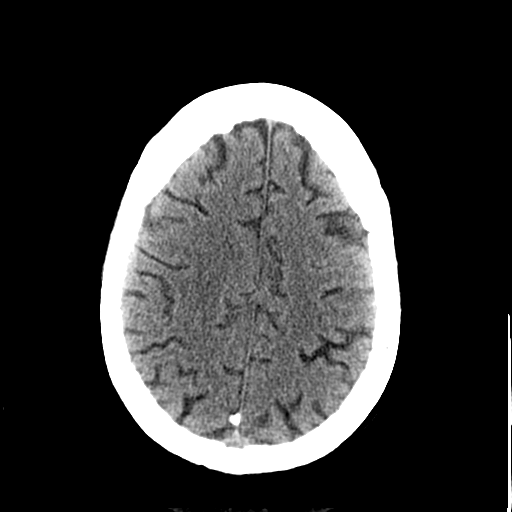
[im 27/36  brain]
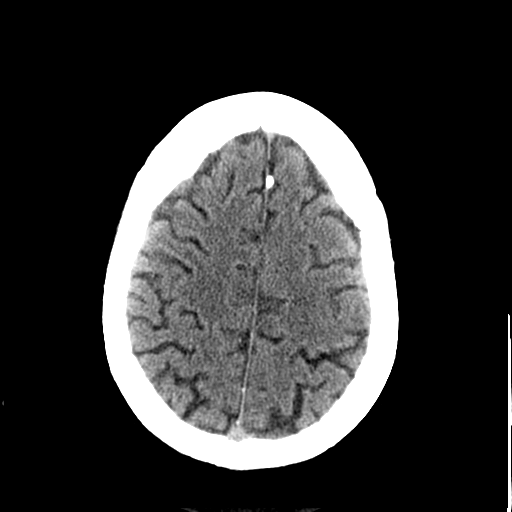
[im 27/36  bone]
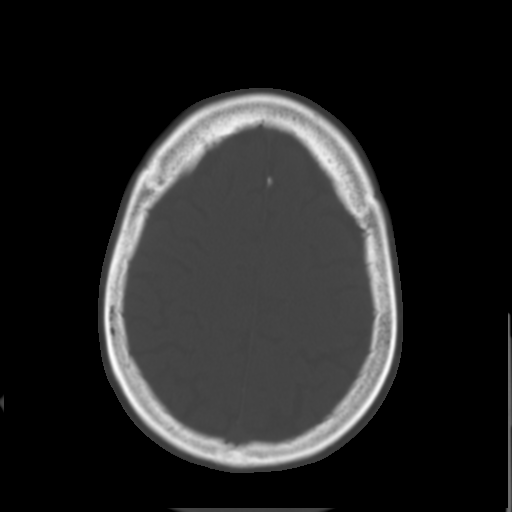
[im 29/36  brain]
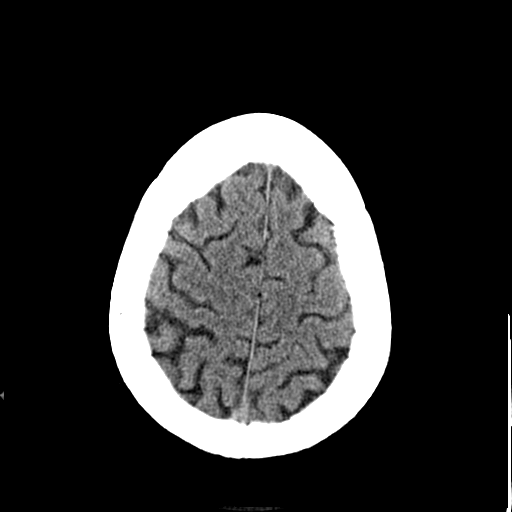
[im 32/36  brain]
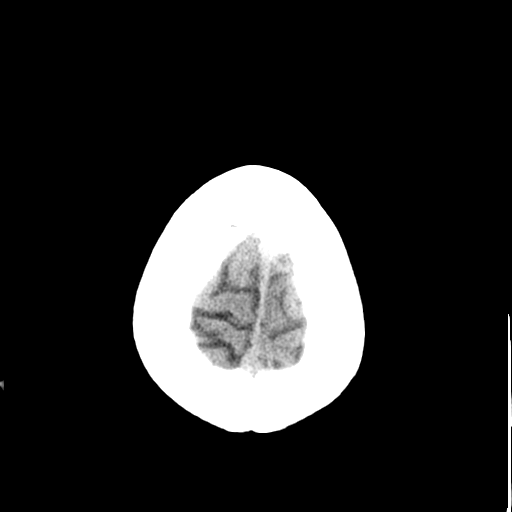
[im 34/36  brain]
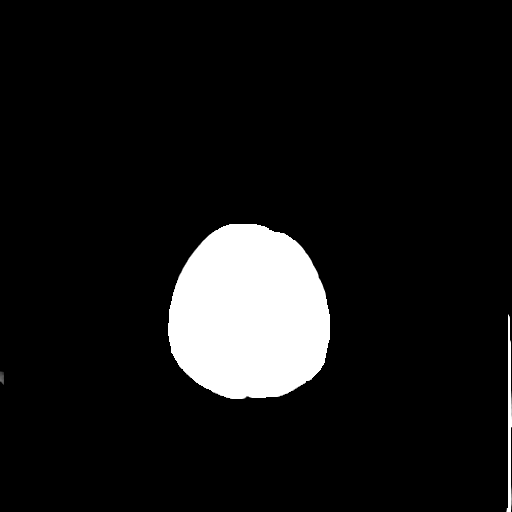

[16 of 30 positions shown; findings below may reference images not displayed]

FINDINGS: Moderate cortical atrophy and mild deep atrophy
consistent with age.  Mild changes of small vessel disease of the
white matter diffusely.  No mass lesion.  No midline shift.  No
acute hemorrhage or hematoma.  No extra-axial fluid collections.
No evidence of acute infarction.

Mild changes of hyperostosis frontalis interna.  Visualized
paranasal sinuses, mastoid air cells, and middle ear cavities well-
aerated.  Extensive bilateral carotid siphon and left vertebral
artery atherosclerosis.
IMPRESSION: 1.  No acute intracranial abnormality.
2.  Mild to moderate generalized atrophy and mild chronic
microvascular ischemic changes of the white matter diffusely.

## 2013-08-17 ENCOUNTER — Encounter: Payer: Self-pay | Admitting: Podiatry

## 2013-08-17 ENCOUNTER — Ambulatory Visit (INDEPENDENT_AMBULATORY_CARE_PROVIDER_SITE_OTHER): Payer: Medicare Other | Admitting: Podiatry

## 2013-08-17 VITALS — BP 133/70 | HR 76 | Resp 16

## 2013-08-17 DIAGNOSIS — M79609 Pain in unspecified limb: Secondary | ICD-10-CM

## 2013-08-17 DIAGNOSIS — B351 Tinea unguium: Secondary | ICD-10-CM

## 2013-08-17 NOTE — Progress Notes (Signed)
Subjective:     Patient ID: [name], female   DOB: 03/13/1933, 80 y.o.   MRN: [id number]  HPI patient presents with nail disease 1-5 of both feet with pain and thickness   Review of Systems     Objective:   Physical Exam No change neurovascular status and patient is well oriented   with thick painful nail bed 1-5 both feet Assessment:     Mycotic nail infection with pain 1-5 both feet    Plan:     Debridement of painful nailbeds 1-5 both feet

## 2013-08-17 NOTE — Patient Instructions (Signed)
Diabetes and Foot Care Diabetes may cause you to have problems because of poor blood supply (circulation) to your feet and legs. This may cause the skin on your feet to become thinner, break easier, and heal more slowly. Your skin may become dry, and the skin may peel and crack. You may also have nerve damage in your legs and feet causing decreased feeling in them. You may not notice minor injuries to your feet that could lead to infections or more serious problems. Taking care of your feet is one of the most important things you can do for yourself.  HOME CARE INSTRUCTIONS  Wear shoes at all times, even in the house. Do not go barefoot. Bare feet are easily injured.  Check your feet daily for blisters, cuts, and redness. If you cannot see the bottom of your feet, use a mirror or ask someone for help.  Wash your feet with warm water (do not use hot water) and mild soap. Then pat your feet and the areas between your toes until they are completely dry. Do not soak your feet as this can dry your skin.  Apply a moisturizing lotion or petroleum jelly (that does not contain alcohol and is unscented) to the skin on your feet and to dry, brittle toenails. Do not apply lotion between your toes.  Trim your toenails straight across. Do not dig under them or around the cuticle. File the edges of your nails with an emery board or nail file.  Do not cut corns or calluses or try to remove them with medicine.  Wear clean socks or stockings every day. Make sure they are not too tight. Do not wear knee-high stockings since they may decrease blood flow to your legs.  Wear shoes that fit properly and have enough cushioning. To break in new shoes, wear them for just a few hours a day. This prevents you from injuring your feet. Always look in your shoes before you put them on to be sure there are no objects inside.  Do not cross your legs. This may decrease the blood flow to your feet.  If you find a minor scrape,  cut, or break in the skin on your feet, keep it and the skin around it clean and dry. These areas may be cleansed with mild soap and water. Do not cleanse the area with peroxide, alcohol, or iodine.  When you remove an adhesive bandage, be sure not to damage the skin around it.  If you have a wound, look at it several times a day to make sure it is healing.  Do not use heating pads or hot water bottles. They may burn your skin. If you have lost feeling in your feet or legs, you may not know it is happening until it is too late.  Make sure your health care provider performs a complete foot exam at least annually or more often if you have foot problems. Report any cuts, sores, or bruises to your health care provider immediately. SEEK MEDICAL CARE IF:   You have an injury that is not healing.  You have cuts or breaks in the skin.  You have an ingrown nail.  You notice redness on your legs or feet.  You feel burning or tingling in your legs or feet.  You have pain or cramps in your legs and feet.  Your legs or feet are numb.  Your feet always feel cold. SEEK IMMEDIATE MEDICAL CARE IF:   There is increasing redness,   swelling, or pain in or around a wound.  There is a red line that goes up your leg.  Pus is coming from a wound.  You develop a fever or as directed by your health care provider.  You notice a bad smell coming from an ulcer or wound. Document Released: 07/24/2000 Document Revised: 03/29/2013 Document Reviewed: 01/03/2013 [employer] Patient Information 2014 [employer].  

## 2013-09-04 ENCOUNTER — Emergency Department (HOSPITAL_COMMUNITY): Payer: Medicare Other

## 2013-09-04 ENCOUNTER — Emergency Department (HOSPITAL_COMMUNITY)
Admission: EM | Admit: 2013-09-04 | Discharge: 2013-09-04 | Disposition: A | Discharge status: Home / Self Care | Payer: Medicare Other | Attending: Emergency Medicine | Admitting: Emergency Medicine

## 2013-09-04 ENCOUNTER — Encounter (HOSPITAL_COMMUNITY): Payer: Self-pay | Admitting: Emergency Medicine

## 2013-09-04 DIAGNOSIS — R5383 Other fatigue: Secondary | ICD-10-CM

## 2013-09-04 DIAGNOSIS — E119 Type 2 diabetes mellitus without complications: Secondary | ICD-10-CM | POA: Insufficient documentation

## 2013-09-04 DIAGNOSIS — Y9389 Activity, other specified: Secondary | ICD-10-CM | POA: Insufficient documentation

## 2013-09-04 DIAGNOSIS — W19XXXA Unspecified fall, initial encounter: Secondary | ICD-10-CM

## 2013-09-04 DIAGNOSIS — Z79899 Other long term (current) drug therapy: Secondary | ICD-10-CM | POA: Insufficient documentation

## 2013-09-04 DIAGNOSIS — S32009A Unspecified fracture of unspecified lumbar vertebra, initial encounter for closed fracture: Secondary | ICD-10-CM | POA: Insufficient documentation

## 2013-09-04 DIAGNOSIS — S199XXA Unspecified injury of neck, initial encounter: Secondary | ICD-10-CM

## 2013-09-04 DIAGNOSIS — S32000A Wedge compression fracture of unspecified lumbar vertebra, initial encounter for closed fracture: Secondary | ICD-10-CM

## 2013-09-04 DIAGNOSIS — M25559 Pain in unspecified hip: Secondary | ICD-10-CM

## 2013-09-04 DIAGNOSIS — S22000A Wedge compression fracture of unspecified thoracic vertebra, initial encounter for closed fracture: Secondary | ICD-10-CM

## 2013-09-04 DIAGNOSIS — F329 Major depressive disorder, single episode, unspecified: Secondary | ICD-10-CM | POA: Insufficient documentation

## 2013-09-04 DIAGNOSIS — F3289 Other specified depressive episodes: Secondary | ICD-10-CM | POA: Insufficient documentation

## 2013-09-04 DIAGNOSIS — W1809XA Striking against other object with subsequent fall, initial encounter: Secondary | ICD-10-CM | POA: Insufficient documentation

## 2013-09-04 DIAGNOSIS — K219 Gastro-esophageal reflux disease without esophagitis: Secondary | ICD-10-CM | POA: Insufficient documentation

## 2013-09-04 DIAGNOSIS — M549 Dorsalgia, unspecified: Secondary | ICD-10-CM

## 2013-09-04 DIAGNOSIS — R300 Dysuria: Secondary | ICD-10-CM | POA: Insufficient documentation

## 2013-09-04 DIAGNOSIS — S22009A Unspecified fracture of unspecified thoracic vertebra, initial encounter for closed fracture: Secondary | ICD-10-CM | POA: Insufficient documentation

## 2013-09-04 DIAGNOSIS — S79929A Unspecified injury of unspecified thigh, initial encounter: Secondary | ICD-10-CM

## 2013-09-04 DIAGNOSIS — M129 Arthropathy, unspecified: Secondary | ICD-10-CM | POA: Insufficient documentation

## 2013-09-04 DIAGNOSIS — Z8669 Personal history of other diseases of the nervous system and sense organs: Secondary | ICD-10-CM | POA: Insufficient documentation

## 2013-09-04 DIAGNOSIS — Z8619 Personal history of other infectious and parasitic diseases: Secondary | ICD-10-CM | POA: Insufficient documentation

## 2013-09-04 DIAGNOSIS — S0990XA Unspecified injury of head, initial encounter: Secondary | ICD-10-CM | POA: Insufficient documentation

## 2013-09-04 DIAGNOSIS — S79919A Unspecified injury of unspecified hip, initial encounter: Secondary | ICD-10-CM | POA: Insufficient documentation

## 2013-09-04 DIAGNOSIS — Z7982 Long term (current) use of aspirin: Secondary | ICD-10-CM | POA: Insufficient documentation

## 2013-09-04 DIAGNOSIS — S0993XA Unspecified injury of face, initial encounter: Secondary | ICD-10-CM | POA: Insufficient documentation

## 2013-09-04 DIAGNOSIS — Z794 Long term (current) use of insulin: Secondary | ICD-10-CM | POA: Insufficient documentation

## 2013-09-04 DIAGNOSIS — M542 Cervicalgia: Secondary | ICD-10-CM

## 2013-09-04 DIAGNOSIS — Y929 Unspecified place or not applicable: Secondary | ICD-10-CM | POA: Insufficient documentation

## 2013-09-04 DIAGNOSIS — R5381 Other malaise: Secondary | ICD-10-CM | POA: Insufficient documentation

## 2013-09-04 LAB — COMPREHENSIVE METABOLIC PANEL
ALT: 11 U/L (ref 0–35)
AST: 22 U/L (ref 0–37)
Albumin: 3.6 g/dL (ref 3.5–5.2)
Alkaline Phosphatase: 99 U/L (ref 39–117)
BILIRUBIN TOTAL: 0.3 mg/dL (ref 0.3–1.2)
BUN: 13 mg/dL (ref 6–23)
CHLORIDE: 90 meq/L — AB (ref 96–112)
CO2: 28 mEq/L (ref 19–32)
CREATININE: 0.63 mg/dL (ref 0.50–1.10)
Calcium: 9.4 mg/dL (ref 8.4–10.5)
GFR calc non Af Amer: 84 mL/min — ABNORMAL LOW (ref >90)
GLUCOSE: 90 mg/dL (ref 70–99)
Potassium: 3.7 mEq/L (ref 3.7–5.3)
Sodium: 131 mEq/L — ABNORMAL LOW (ref 137–147)
Total Protein: 6.8 g/dL (ref 6.0–8.3)

## 2013-09-04 LAB — CBC WITH DIFFERENTIAL/PLATELET
BASOS ABS: 0 10*3/uL (ref 0.0–0.1)
Basophils Relative: 0 % (ref 0–1)
EOS PCT: 1 % (ref 0–5)
Eosinophils Absolute: 0.1 10*3/uL (ref 0.0–0.7)
HEMATOCRIT: 41.8 % (ref 36.0–46.0)
HEMOGLOBIN: 14.4 g/dL (ref 12.0–15.0)
LYMPHS PCT: 32 % (ref 12–46)
Lymphs Abs: 2.2 10*3/uL (ref 0.7–4.0)
MCH: 28.2 pg (ref 26.0–34.0)
MCHC: 34.4 g/dL (ref 30.0–36.0)
MCV: 81.8 fL (ref 78.0–100.0)
MONOS PCT: 8 % (ref 3–12)
Monocytes Absolute: 0.5 10*3/uL (ref 0.1–1.0)
Neutro Abs: 4.1 10*3/uL (ref 1.7–7.7)
Neutrophils Relative %: 59 % (ref 43–77)
Platelets: 202 10*3/uL (ref 150–400)
RBC: 5.11 MIL/uL (ref 3.87–5.11)
RDW: 13.6 % (ref 11.5–15.5)
WBC: 6.9 10*3/uL (ref 4.0–10.5)

## 2013-09-04 LAB — URINALYSIS, ROUTINE W REFLEX MICROSCOPIC
Bilirubin Urine: NEGATIVE
Glucose, UA: NEGATIVE mg/dL
HGB URINE DIPSTICK: NEGATIVE
KETONES UR: NEGATIVE mg/dL
Leukocytes, UA: NEGATIVE
Nitrite: NEGATIVE
PROTEIN: NEGATIVE mg/dL
Specific Gravity, Urine: 1.009 (ref 1.005–1.030)
UROBILINOGEN UA: 0.2 mg/dL (ref 0.0–1.0)
pH: 7.5 (ref 5.0–8.0)

## 2013-09-04 LAB — GLUCOSE, CAPILLARY: Glucose-Capillary: 70 mg/dL (ref 70–99)

## 2013-09-04 LAB — TROPONIN I: Troponin I: <0.3 ng/mL (ref <0.30)

## 2013-09-04 MED ORDER — ACETAMINOPHEN 325 MG PO TABS
650.0000 mg | ORAL_TABLET | Freq: Once | ORAL | Status: AC
  Administered 2013-09-04: 650 mg via ORAL
  Filled 2013-09-04: qty 2

## 2013-09-04 NOTE — ED Notes (Signed)
[facility] called, will be added to the list

## 2013-09-04 NOTE — Discharge Instructions (Signed)
Use wheelchair for walking long distances  Use walker with staff assistance until evaluated by your doctor Your images of your back show a chronic (old) and acute (new) fracture of the lower back.  Follow-up with your orthopedic doctor regarding these results  Return to the emergency department if you develop any changing/worsening condition, severe headache, chest pain, repeated vomiting, severe/worsening back pain, confusion, weakness, slurred speech,  or any other concerns (please read additional information regarding your condition below)     Fall Prevention and Home Safety Falls cause injuries and can affect all age groups. It is possible to use preventive measures to significantly decrease the likelihood of falls. There are many simple measures which can make your home safer and prevent falls. OUTDOORS  Repair cracks and edges of walkways and driveways.  Remove high doorway thresholds.  Trim shrubbery on the main path into your home.  Have good outside lighting.  Clear walkways of tools, rocks, debris, and clutter.  Check that handrails are not broken and are securely fastened. Both sides of steps should have handrails.  Have leaves, snow, and ice cleared regularly.  Use sand or salt on walkways during winter months.  In the garage, clean up grease or oil spills. BATHROOM  Install night lights.  Install grab bars by the toilet and in the tub and shower.  Use non-skid mats or decals in the tub or shower.  Place a plastic non-slip stool in the shower to sit on, if needed.  Keep floors dry and clean up all water on the floor immediately.  Remove soap buildup in the tub or shower on a regular basis.  Secure bath mats with non-slip, double-sided rug tape.  Remove throw rugs and tripping hazards from the floors. BEDROOMS  Install night lights.  Make sure a bedside light is easy to reach.  Do not use oversized bedding.  Keep a telephone by your bedside.  Have a  firm chair with side arms to use for getting dressed.  Remove throw rugs and tripping hazards from the floor. KITCHEN  Keep handles on pots and pans turned toward the center of the stove. Use back burners when possible.  Clean up spills quickly and allow time for drying.  Avoid walking on wet floors.  Avoid hot utensils and knives.  Position shelves so they are not too high or low.  Place commonly used objects within easy reach.  If necessary, use a sturdy step stool with a grab bar when reaching.  Keep electrical cables out of the way.  Do not use floor polish or wax that makes floors slippery. If you must use wax, use non-skid floor wax.  Remove throw rugs and tripping hazards from the floor. STAIRWAYS  Never leave objects on stairs.  Place handrails on both sides of stairways and use them. Fix any loose handrails. Make sure handrails on both sides of the stairways are as long as the stairs.  Check carpeting to make sure it is firmly attached along stairs. Make repairs to worn or loose carpet promptly.  Avoid placing throw rugs at the top or bottom of stairways, or properly secure the rug with carpet tape to prevent slippage. Get rid of throw rugs, if possible.  Have an electrician put in a light switch at the top and bottom of the stairs. OTHER FALL PREVENTION TIPS  Wear low-heel or rubber-soled shoes that are supportive and fit well. Wear closed toe shoes.  When using a stepladder, make sure it is fully  opened and both spreaders are firmly locked. Do not climb a closed stepladder.  Add color or contrast paint or tape to grab bars and handrails in your home. Place contrasting color strips on first and last steps.  Learn and use mobility aids as needed. Install an electrical emergency response system.  Turn on lights to avoid dark areas. Replace light bulbs that burn out immediately. Get light switches that glow.  Arrange furniture to create clear pathways. Keep  furniture in the same place.  Firmly attach carpet with non-skid or double-sided tape.  Eliminate uneven floor surfaces.  Select a carpet pattern that does not visually hide the edge of steps.  Be aware of all pets. OTHER HOME SAFETY TIPS  Set the water temperature for 120 F (48.8 C).  Keep emergency numbers on or near the telephone.  Keep smoke detectors on every level of the home and near sleeping areas. Document Released: 07/17/2002 Document Revised: 01/26/2012 Document Reviewed: 10/16/2011 [facility] Patient Information 2014 [place], [place].  Head Injury, Adult You have received a head injury. It does not appear serious at this time. Headaches and vomiting are common following head injury. It should be easy to awaken from sleeping. Sometimes it is necessary for you to stay in the emergency department for a while for observation. Sometimes admission to the hospital may be needed. After injuries such as yours, most problems occur within the first 24 hours, but side effects may occur up to 7 10 days after the injury. It is important for you to carefully monitor your condition and contact your health care provider or seek immediate medical care if there is a change in your condition. WHAT ARE THE TYPES OF HEAD INJURIES? Head injuries can be as minor as a bump. Some head injuries can be more severe. More severe head injuries include:  A jarring injury to the brain (concussion).  A bruise of the brain (contusion). This mean there is bleeding in the brain that can cause swelling.  A cracked skull (skull fracture).  Bleeding in the brain that collects, clots, and forms a bump (hematoma). WHAT CAUSES A HEAD INJURY? A serious head injury is most likely to happen to someone who is in a car wreck and is not wearing a seat belt. Other causes of major head injuries include bicycle or motorcycle accidents, sports injuries, and falls. HOW ARE HEAD INJURIES DIAGNOSED? A complete history of the  event leading to the injury and your current symptoms will be helpful in diagnosing head injuries. Many times, pictures of the brain, such as CT or MRI are needed to see the extent of the injury. Often, an overnight hospital stay is necessary for observation.  WHEN SHOULD I SEEK IMMEDIATE MEDICAL CARE?  You should get help right away if:  You have confusion or drowsiness.  You feel sick to your stomach (nauseous) or have continued, forceful vomiting.  You have dizziness or unsteadiness that is getting worse.  You have severe, continued headaches not relieved by medicine. Only take over-the-counter or prescription medicines for pain, fever, or discomfort as directed by your health care provider.  You do not have normal function of the arms or legs or are unable to walk.  You notice changes in the black spots in the center of the colored part of your eye (pupil).  You have a clear or bloody fluid coming from your nose or ears.  You have a loss of vision. During the next 24 hours after the injury, you must  stay with someone who can watch you for the warning signs. This person should contact local emergency services (911 in the [place] if you have seizures, you become unconscious, or you are unable to wake up. HOW CAN I PREVENT A HEAD INJURY IN THE FUTURE? The most important factor for preventing major head injuries is avoiding motor vehicle accidents. To minimize the potential for damage to your head, it is crucial to wear seat belts while riding in motor vehicles. Wearing helmets while bike riding and playing collision sports (like football) is also helpful. Also, avoiding dangerous activities around the house will further help reduce your risk of head injury.  WHEN CAN I RETURN TO NORMAL ACTIVITIES AND ATHLETICS? You should be reevaluated by your health care provider before returning to these activities. If you have any of the following symptoms, you should not return to activities or contact  sports until 1 week after the symptoms have stopped:  Persistent headache.  Dizziness or vertigo.  Poor attention and concentration.  Confusion.  Memory problems.  Nausea or vomiting.  Fatigue or tire easily.  Irritability.  Intolerant of bright lights or loud noises.  Anxiety or depression.  Disturbed sleep. MAKE SURE YOU:   Understand these instructions.  Will watch your condition.  Will get help right away if you are not doing well or get worse. Document Released: 07/27/2005 Document Revised: 05/17/2013 Document Reviewed: 04/03/2013 [facility] Patient Information 2014 [place], [place].  Back, Compression Fracture A compression fracture happens when a force is put upon the length of your spine. Slipping and falling on your bottom are examples of such a force. When this happens, sometimes the force is great enough to compress the building blocks (vertebral bodies) of your spine. Although this causes a lot of pain, this can usually be treated at home, unless your caregiver feels hospitalization is needed for pain control. Your backbone (spinal column) is made up of 24 main vertebral bodies in addition to the sacrum and coccyx (see illustration). These are held together by tough fibrous tissues (ligaments) and by support of your muscles. Nerve roots pass through the openings between the vertebrae. A sudden wrenching move, injury, or a fall may cause a compression fracture of one of the vertebral bodies. This may result in back pain or spread of pain into the belly (abdomen), the buttocks, and down the leg into the foot. Pain may also be created by muscle spasm alone. Large studies have been undertaken to determine the best possible course of action to help your back following injury and also to prevent future problems. The recommendations are as follows. FOLLOWING A COMPRESSION FRACTURE: Do the following only if advised by your caregiver.   If a back brace has been suggested or  provided, wear it as directed.  DO NOT stop wearing the back brace unless instructed by your caregiver.  When allowed to return to regular activities, avoid a sedentary life style. Actively exercise. Sporadic weekend binges of tennis, racquetball, water skiing, may actually aggravate or create problems, especially if you are not in condition for that activity.  Avoid sports requiring sudden body movements until you are in condition for them. Swimming and walking are safer activities.  Maintain good posture.  Avoid obesity.  If not already done, you should have a DEXA scan. Based on the results, be treated for osteoporosis. FOLLOWING ACUTE (SUDDEN) INJURY:  Only take over-the-counter or prescription medicines for pain, discomfort, or fever as directed by your caregiver.  Use bed rest for  only the most extreme acute episode. Prolonged bed rest may aggravate your condition. Ice used for acute conditions is effective. Use a large plastic bag filled with ice. Wrap it in a towel. This also provides excellent pain relief. This may be continuous. Or use it for 30 minutes every 2 hours during acute phase, then as needed. Heat for 30 minutes prior to activities is helpful.  As soon as the acute phase (the time when your back is too painful for you to do normal activities) is over, it is important to resume normal activities and work Arboriculturisthardening programs. Back injuries can cause potentially marked changes in lifestyle. So it is important to attack these problems aggressively.  See your caregiver for continued problems. He or she can help or refer you for appropriate exercises, physical therapy and work hardening if needed.  If you are given narcotic medications for your condition, for the next 24 hours DO NOT:  Drive  Operate machinery or power tools.  Sign legal documents.  DO NOT drink alcohol, take sleeping pills or other medications that may interfere with treatment. If your caregiver has  given you a follow-up appointment, it is very important to keep that appointment. Not keeping the appointment could result in a chronic or permanent injury, pain, and disability. If there is any problem keeping the appointment, you must call back to this facility for assistance.  SEEK IMMEDIATE MEDICAL CARE IF:  You develop numbness, tingling, weakness, or problems with the use of your arms or legs.  You develop severe back pain not relieved with medications.  You have changes in bowel or bladder control.  You have increasing pain in any areas of the body. Document Released: 07/27/2005 Document Revised: 10/19/2011 Document Reviewed: 02/29/2008 [facility] Patient Information 2014 [place], [place].  Musculoskeletal Pain Musculoskeletal pain is muscle and boney aches and pains. These pains can occur in any part of the body. Your caregiver may treat you without knowing the cause of the pain. They may treat you if blood or urine tests, X-rays, and other tests were normal.  CAUSES There is often not a definite cause or reason for these pains. These pains may be caused by a type of germ (virus). The discomfort may also come from overuse. Overuse includes working out too hard when your body is not fit. Boney aches also come from weather changes. Bone is sensitive to atmospheric pressure changes. HOME CARE INSTRUCTIONS  Ask when your test results will be ready. Make sure you get your test results. Only take over-the-counter or prescription medicines for pain, discomfort, or fever as directed by your caregiver. If you were given medications for your condition, do not drive, operate machinery or power tools, or sign legal documents for 24 hours. Do not drink alcohol. Do not take sleeping pills or other medications that may interfere with treatment. Continue all activities unless the activities cause more pain. When the pain lessens, slowly resume normal activities. Gradually increase the intensity and duration  of the activities or exercise. During periods of severe pain, bed rest may be helpful. Lay or sit in any position that is comfortable. Putting ice on the injured area. Put ice in a bag. Place a towel between your skin and the bag. Leave the ice on for 15 to 20 minutes, 3 to 4 times a day. Follow up with your caregiver for continued problems and no reason can be found for the pain. If the pain becomes worse or does not go away, it may  be necessary to repeat tests or do additional testing. Your caregiver may need to look further for a possible cause. SEEK IMMEDIATE MEDICAL CARE IF: You have pain that is getting worse and is not relieved by medications. You develop chest pain that is associated with shortness or breath, sweating, feeling sick to your stomach (nauseous), or throw up (vomit). Your pain becomes localized to the abdomen. You develop any new symptoms that seem different or that concern you. MAKE SURE YOU:  Understand these instructions. Will watch your condition. Will get help right away if you are not doing well or get worse. Document Released: 07/27/2005 Document Revised: 10/19/2011 Document Reviewed: 03/31/2013 [facility] Patient Information 2014 [place], [place].

## 2013-09-04 NOTE — ED Notes (Signed)
Bed: WA04 Expected date:  Expected time:  Means of arrival:  Comments: ems- 80 yo fall

## 2013-09-04 NOTE — ED Provider Notes (Signed)
History is obtained from patient and patient's husband. Patient fell this morning striking her head also complains of neck pain. She reports back pain from a previous fall. Pain is mild. Patient was assisted living facility and has a wheelchair available to her. Walks with walker at baseline. On exam patient is alert pleasant cooperative HEENT exam normocephalic atraumatic neck no bruit no step-off no point tenderness spine is nontender pelvis stable nontender she has no pain on internal or external rotation of either thigh. All 4 extremities no contusion abrasion or tenderness neurovascularly intact. CT scan of lumbar spine discussed with radiologist Dr. Allena KatzPatel, fracture is felt to be stable.  Doug SouSam Heather Streeper, MD 09/04/13 [phone]

## 2013-09-04 NOTE — ED Notes (Addendum)
Pt from [facility] per EMS pt fell in the bathroom while trying to put on her shoe. She attempted to grab her walker but was unable to catch herself. No obvious injuries or deformities. Pt also fell yesterday. Today she is c/o of left hip pain. Pt with extensive back pain hx. No acute distress. Able to answer questions. CBG 80, BP 130/64 and HR 80.

## 2013-09-04 NOTE — ED Notes (Signed)
Patient transported to CT 

## 2013-09-04 NOTE — ED Provider Notes (Signed)
CSN: [id number]     Arrival date & time 09/04/13  1059 History   First MD Initiated Contact with Patient 09/04/13 1103     Chief Complaint  Patient presents with  . Fall    HPI  [name] is a 80 y.o. female with a PMH of GERD, lumbar spinal stenosis, DM, arthritis, and AMS who presents to the ED for evaluation of a fall.  History was provided by the patient and her husband.  Patient is a poor historian.  Patient today was trying to put on her shoe when she fell forward in the bathroom.  She states she hit the back right side of her head.  She has a mild headache with no vision changes.  She states she "always has a headache" however. She takes a daily aspirin but denies any other anti-coagulation.  Patient denies any LOC. She also complains of left sided neck pain but is unsure if this started before or after her fall.  Patient had a fall two days as well and has been having diffuse back pain and left (and right?) hip pain. She uses a walker for ambulation. No weakness, loss of sensation, numbness/tingling. Per husband, patient was "not herself" a few days ago when their daughter visited the patient. Wants urinalysis to evaluate for UTI since "this has happened before."  She is back to baseline per husband. Patient states "I must be getting old" and states she has been falling more lately and having difficulty with daily activities.  No chest pain, SOB, abdominal pain, nausea, emesis, diarrhea, or constipation.  She occasionally has dysuria.  Patient orthopedic physician is Dr. Newell Coral.     Past Medical History  Diagnosis Date  . Incontinence of urine   . Diarrhea   . Neuropathy     bilateral feet  . GERD (gastroesophageal reflux disease)   . Depression   . Urinary, incontinence, stress female   . History of shingles 2009  . Degenerative lumbar spinal stenosis   . Type II diabetes mellitus     Type 2 IDDM  X 40 years;   . Arthritis     lower back/hands  . Altered mental status  01/13/2013   Past Surgical History  Procedure Laterality Date  . Incontinence surgery  2005  . Tonsillectomy  1964  . Abdominal hysterectomy  1982  . Ovarian cyst surgery  1972  . Cholecystectomy  1988  . Cystoscopy  2009    macroplastique inj  . Rectocele repair  10/15/2011    Procedure: POSTERIOR REPAIR (RECTOCELE);  Surgeon: Kathi Ludwig, MD;  Location: [facility];  Service: Urology;;  posterior vaginal vault repair with sacral spinus repair with graft  . Cystoscopy  10/15/2011    Procedure: CYSTOSCOPY;  Surgeon: Kathi Ludwig, MD;  Location[facility];  Service: Urology;  Laterality: N/A;  . Flexible sigmoidoscopy  02/28/2012    Procedure: FLEXIBLE SIGMOIDOSCOPY;  Surgeon: Willis Modena, MD;  Location: [facility];  Service: Endoscopy;  Laterality: N/A;  . Breast biopsy  1990    bilaterally  . Cataract extraction w/ intraocular lens implant    . Posterior fusion lumbar spine  05/09/2012   Family History  Problem Relation Age of Onset  . Osteoarthritis Sister   . Osteoarthritis Brother    History  Substance Use Topics  . Smoking status: Never Smoker   . Smokeless tobacco: Never Used  . Alcohol Use: No   OB History   Grav  Para Term Preterm Abortions TAB SAB Ect Mult Living                 Review of Systems  Constitutional: Negative for fever, chills, diaphoresis, activity change, appetite change and fatigue.  HENT: Negative for congestion and rhinorrhea.   Respiratory: Negative for cough and shortness of breath.   Cardiovascular: Negative for chest pain and leg swelling.  Gastrointestinal: Negative for nausea, vomiting, abdominal pain, diarrhea and constipation.  Genitourinary: Positive for dysuria.  Musculoskeletal: Positive for back pain and neck pain. Negative for arthralgias, joint swelling, myalgias and neck stiffness.  Skin: Negative for wound.  Neurological: Positive for weakness (generalized) and headaches. Negative  for dizziness, syncope, light-headedness and numbness.    Allergies  Ciprofloxacin; Neosporin; and Sulfa antibiotics  Home Medications   Current Outpatient Rx  Name  Route  Sig  Dispense  Refill  . amiloride-hydrochlorothiazide (MODURETIC) 5-50 MG tablet   Oral   Take 0.5 tablets by mouth daily.         Marland Kitchen aspirin EC 81 MG tablet   Oral   Take 81 mg by mouth daily.         . calcium citrate-vitamin D (CITRACAL+D) 315-200 MG-UNIT per tablet   Oral   Take 2 tablets by mouth daily.         . cholecalciferol (VITAMIN D) 1000 UNITS tablet   Oral   Take 1,000 Units by mouth daily.         . DULoxetine (CYMBALTA) 30 MG capsule   Oral   Take 30 mg by mouth daily.         . fexofenadine (ALLEGRA) 180 MG tablet   Oral   Take 180 mg by mouth daily.         Marland Kitchen GLUCERNA (GLUCERNA) LIQD   Oral   Take 237 mLs by mouth 3 (three) times daily. Vanilla only         . insulin aspart (NOVOLOG) 100 UNIT/ML injection   Subcutaneous   Inject 6 Units into the skin 3 (three) times daily before meals.          . insulin glargine (LANTUS) 100 UNIT/ML injection   Subcutaneous   Inject 25 Units into the skin at bedtime.          . iron polysaccharides (NIFEREX) 150 MG capsule   Oral   Take 150 mg by mouth 2 (two) times daily.         Marland Kitchen LORazepam (ATIVAN) 0.5 MG tablet   Oral   Take 1 tablet (0.5 mg total) by mouth every 12 (twelve) hours. For anxiety.   10 tablet   0   . morphine (MS CONTIN) 15 MG 12 hr tablet   Oral   Take 15 mg by mouth every 12 (twelve) hours.         Marland Kitchen olopatadine (PATANOL) 0.1 % ophthalmic solution   Both Eyes   Place 1 drop into both eyes 2 (two) times daily as needed (itching/watery eyes).          Marland Kitchen omeprazole (PRILOSEC) 20 MG capsule   Oral   Take 20 mg by mouth daily.         . ondansetron (ZOFRAN) 4 MG tablet   Oral   Take 4 mg by mouth every 8 (eight) hours as needed for nausea or vomiting.         Marland Kitchen oxybutynin  (DITROPAN-XL) 5 MG 24 hr tablet   Oral   Take  5 mg by mouth daily.          . polyethylene glycol (MIRALAX / GLYCOLAX) packet   Oral   Take 17 g by mouth daily.          . polyvinyl alcohol (LIQUIFILM TEARS) 1.4 % ophthalmic solution   Both Eyes   Place 1 drop into both eyes 3 (three) times daily.         . potassium chloride SA (K-DUR,KLOR-CON) 20 MEQ tablet   Oral   Take 1 tablet (20 mEq total) by mouth daily.   30 tablet   11   . senna-docusate (SENNA-S) 8.6-50 MG per tablet   Oral   Take 2 tablets by mouth at bedtime.         Marland Kitchen acetaminophen (TYLENOL) 325 MG tablet   Oral   Take 650 mg by mouth every 6 (six) hours as needed. For pain          BP 129/58  Pulse 67  Temp(Src) 98.5 F (36.9 C) (Oral)  Resp 16  SpO2 99%  Filed Vitals:   09/04/13 1106 09/04/13 1339 09/04/13 1629 09/04/13 1941  BP: 129/58 142/73 138/69 138/68  Pulse: 67 67 88 71  Temp: 98.5 F (36.9 C) 97.8 F (36.6 C) 98 F (36.7 C)   TempSrc: Oral Oral Oral   Resp: 16 14 16 16   SpO2: 99% 96% 96% 97%    Physical Exam  Nursing note and vitals reviewed. Constitutional: She is oriented to person, place, and time. She appears well-developed and well-nourished. No distress.  HENT:  Head: Normocephalic and atraumatic.  Right Ear: External ear normal.  Left Ear: External ear normal.  Nose: Nose normal.  Mouth/Throat: Oropharynx is clear and moist. No oropharyngeal exudate.  No tenderness to the scalp or face throughout. No palpable hematoma, step-offs, or lacerations throughout.  Unable to visualize right TM.  Left TM gray and translucent.    Eyes: Conjunctivae and EOM are normal. Pupils are equal, round, and reactive to light. Right eye exhibits no discharge. Left eye exhibits no discharge.  Neck: Normal range of motion. Neck supple.  No cervical spinal tenderness.  Diffuse tenderness to palpation to the left lateral neck with no overlying edema, ecchymosis, erythema, or lacerations   Cardiovascular: Normal rate, regular rhythm, normal heart sounds and intact distal pulses.  Exam reveals no gallop and no friction rub.   No murmur heard. Dorsalis pedis pulses present and equal bilaterally  Pulmonary/Chest: Effort normal and breath sounds normal. No respiratory distress. She has no wheezes. She has no rales. She exhibits no tenderness.  Abdominal: Soft. She exhibits no distension and no mass. There is no tenderness. There is no rebound and no guarding.  Musculoskeletal: Normal range of motion. She exhibits tenderness. She exhibits no edema.  Diffuse tenderness to palpation to the thoracic and lumbar spine and paraspinal muscles.  Patient able to sit up for exam without assistance. Strength 5/5 in the upper and lower extremities bilaterally. No increased pain with flexion, extension, or internal/external hip rotation. No pedal edema or calf tenderness bilaterally  Neurological: She is alert and oriented to person, place, and time.  GCS 15.  No focal neurological deficits.  CN 2-12 intact.  No pronator drift.  Finger to nose intact.   Skin: Skin is warm and dry. She is not diaphoretic.    ED Course  Procedures (including critical care time) Labs Review Labs Reviewed  URINALYSIS, ROUTINE W REFLEX MICROSCOPIC  CBC WITH DIFFERENTIAL  COMPREHENSIVE METABOLIC PANEL  TROPONIN I   Imaging Review No results found.  EKG Interpretation    Date/Time:  Monday September 04 2013 12:41:00 EST Ventricular Rate:  66 PR Interval:  111 QRS Duration: 75 QT Interval:  387 QTC Calculation: 405 R Axis:   62 Text Interpretation:  Sinus rhythm Borderline short PR interval No significant change since last tracing Confirmed by Ethelda Chick  MD, SAM ([id number]) on 09/04/2013 1:07:00 PM           Results for orders placed during the hospital encounter of 09/04/13  URINALYSIS, ROUTINE W REFLEX MICROSCOPIC      Result Value Range   Color, Urine YELLOW  YELLOW   APPearance CLEAR  CLEAR    Specific Gravity, Urine 1.009  1.005 - 1.030   pH 7.5  5.0 - 8.0   Glucose, UA NEGATIVE  NEGATIVE mg/dL   Hgb urine dipstick NEGATIVE  NEGATIVE   Bilirubin Urine NEGATIVE  NEGATIVE   Ketones, ur NEGATIVE  NEGATIVE mg/dL   Protein, ur NEGATIVE  NEGATIVE mg/dL   Urobilinogen, UA 0.2  0.0 - 1.0 mg/dL   Nitrite NEGATIVE  NEGATIVE   Leukocytes, UA NEGATIVE  NEGATIVE  CBC WITH DIFFERENTIAL      Result Value Range   WBC 6.9  4.0 - 10.5 K/uL   RBC 5.11  3.87 - 5.11 MIL/uL   Hemoglobin 14.4  12.0 - 15.0 g/dL   HCT 16.1  09.6 - 04.5 %   MCV 81.8  78.0 - 100.0 fL   MCH 28.2  26.0 - 34.0 pg   MCHC 34.4  30.0 - 36.0 g/dL   RDW 40.9  81.1 - 91.4 %   Platelets 202  150 - 400 K/uL   Neutrophils Relative % 59  43 - 77 %   Neutro Abs 4.1  1.7 - 7.7 K/uL   Lymphocytes Relative 32  12 - 46 %   Lymphs Abs 2.2  0.7 - 4.0 K/uL   Monocytes Relative 8  3 - 12 %   Monocytes Absolute 0.5  0.1 - 1.0 K/uL   Eosinophils Relative 1  0 - 5 %   Eosinophils Absolute 0.1  0.0 - 0.7 K/uL   Basophils Relative 0  0 - 1 %   Basophils Absolute 0.0  0.0 - 0.1 K/uL  COMPREHENSIVE METABOLIC PANEL      Result Value Range   Sodium 131 (*) 137 - 147 mEq/L   Potassium 3.7  3.7 - 5.3 mEq/L   Chloride 90 (*) 96 - 112 mEq/L   CO2 28  19 - 32 mEq/L   Glucose, Bld 90  70 - 99 mg/dL   BUN 13  6 - 23 mg/dL   Creatinine, Ser 7.82  0.50 - 1.10 mg/dL   Calcium 9.4  8.4 - 95.6 mg/dL   Total Protein 6.8  6.0 - 8.3 g/dL   Albumin 3.6  3.5 - 5.2 g/dL   AST 22  0 - 37 U/L   ALT 11  0 - 35 U/L   Alkaline Phosphatase 99  39 - 117 U/L   Total Bilirubin 0.3  0.3 - 1.2 mg/dL   GFR calc non Af Amer 84 (*) >90 mL/min   GFR calc Af Amer >90  >90 mL/min  TROPONIN I      Result Value Range   Troponin I <0.30  <0.30 ng/mL  GLUCOSE, CAPILLARY      Result Value Range   Glucose-Capillary 70  70 - 99  mg/dL       CT Lumbar Spine Wo Contrast (Final result)  Result time: 09/04/13 15:19:01    Final result by Rad Results In Interface  (09/04/13 15:19:01)    Narrative:   CLINICAL DATA: Back pain status post fall  EXAM: CT LUMBAR SPINE WITHOUT CONTRAST  TECHNIQUE: Multidetector CT imaging of the lumbar spine was performed without intravenous contrast administration. Multiplanar CT image reconstructions were also generated.  COMPARISON: None.  FINDINGS: There is a chronic L1 vertebral body compression fracture with severe height loss. There is 8 mm of retropulsion of the superior posterior margin of L1 with moderate central canal stenosis.  There is a acute L2 vertebral body compression fracture. There is 4.4 mm of retropulsion of the superior posterior margin of the L2 vertebral body with mild impression on the central canal. An The alignment is anatomic. The paravertebral soft tissues are normal. There is no fracture or static listhesis. There is no spondylolysis.  There is posterior spinal fusion from L3 through L5 without hardware failure or complication. The visualized portion of the SI joints are unremarkable.  There is abdominal aortic atherosclerosis.  IMPRESSION: 1. Acute L2 vertebral body compression fracture with 4.4 mm of retropulsion of the superior posterior margin of L2 with mild impression on the central canal. 2. Chronic L1 vertebral body compression fracture with 8 mm of retropulsion of the superior posterior margin of L1 with moderate central canal stenosis. 3. PLIF at L3 through L5.   Electronically Signed By: Elige Ko On: 09/04/2013 15:19             CT Head Wo Contrast (In process)          CT Cervical Spine Wo Contrast (Final result)  Result time: 09/04/13 12:40:24    Final result by Rad Results In Interface (09/04/13 12:40:24)    Narrative:   CLINICAL DATA: FALL  EXAM: CT HEAD WITHOUT CONTRAST  CT CERVICAL SPINE WITHOUT CONTRAST  TECHNIQUE: Multidetector CT imaging of the head and cervical spine was performed following the standard protocol without  intravenous contrast. Multiplanar CT image reconstructions of the cervical spine were also generated.  COMPARISON: CT HEAD W/O CM dated 01/13/2013  FINDINGS: CT HEAD FINDINGS  No intracranial hemorrhage. No parenchymal contusion. No midline shift or mass effect. Basilar cisterns are patent. No skull base fracture. No fluid in the paranasal sinuses or mastoid air cells.  There is generalized cortical atrophy. Mild white matter hypodensities.  CT CERVICAL SPINE FINDINGS  No prevertebral soft tissue swelling. Normal alignment of cervical vertebral bodies. No loss of vertebral body height. Normal facet articulation. Normal craniocervical junction.  There is endplate spurring from C4 through C6.  No evidence epidural or paraspinal hematoma.  IMPRESSION: 1. No intracranial trauma. 2. Mild atrophy and microvascular disease. 3. No cervical spine fracture. 4. Mild degenerative changes of the cervical spine.   Electronically Signed By: Genevive Bi M.D. On: 09/04/2013 12:40             DG Hip Bilateral W/Pelvis (Final result)  Result time: 09/04/13 12:22:19    Final result by Rad Results In Interface (09/04/13 12:22:19)    Narrative:   CLINICAL DATA: Bilateral hip pain status post fall  EXAM: BILATERAL HIP WITH PELVIS - 4+ VIEW  COMPARISON: None.  FINDINGS: There is no acute fracture or dislocation. There is generalized osteopenia. There are mild degenerative changes of bilateral hips. Again noted is PLIF at L3 through L5.  There is generalized osteopenia. There is peripheral  vascular atherosclerotic disease.  IMPRESSION: No acute osseous injury of the hips.   Electronically Signed By: Elige KoHetal Patel On: 09/04/2013 12:22             DG Lumbar Spine Complete (Final result)  Result time: 09/04/13 12:20:18    Final result by Rad Results In Interface (09/04/13 12:20:18)    Narrative:   CLINICAL DATA: Patient fell today. Bilateral hip pain and  back  EXAM: LUMBAR SPINE - COMPLETE 4+ VIEW  COMPARISON: DG LUMBAR SPINE COMPLETE 4+V dated 02/03/2013  FINDINGS: There are 5 nonrib bearing lumbar-type vertebral bodies. There is a severe L1 compression fracture with vertebra plana unchanged compared with 02/03/2013. There is mild vertebral body height loss of the L2 vertebral body compared with the prior exam of 02/03/2013 which is of indeterminate age. The alignment is anatomic. There is no spondylolysis. There is no acute fracture or static listhesis.  There is a severe levoscoliosis of the lumbar spine. There is posterior spinal fusion L3, L4 and L5. There is no hardware failure or complication. There is generalized osteopenia.  The SI joints are unremarkable.  IMPRESSION: 1. There is mild vertebral body height loss of the L2 vertebral body compared with the prior exam of 02/03/2013 which is of indeterminate age. If there is further clinical concern regarding the acuity of the fracture, further evaluation with a bone scan or MRI is recommended.   Electronically Signed By: Elige KoHetal Patel On: 09/04/2013 12:20             DG Thoracic Spine 2 View (Final result)  Result time: 09/04/13 12:29:57    Final result by Rad Results In Interface (09/04/13 12:29:57)    Narrative:   CLINICAL DATA: Fall, back pain  EXAM: THORACIC SPINE - 2 VIEW  COMPARISON: Chest radiograph dated 01/13/2013. CT abdomen pelvis dated 12/20/2012.  FINDINGS: Normal thoracic kyphosis.  Mild superior endplate compression fracture deformity at T12, age indeterminate but new from prior CT. No retropulsion.  Mild multilevel degenerative changes.  Lumbar levoscoliosis with postsurgical changes.  Cholecystectomy clips.  IMPRESSION: Mild superior endplate compression fracture deformity T12, age indeterminate but new from 12/20/2012. No retropulsion.   Electronically Signed By: Charline BillsSriyesh Krishnan M.D. On: 09/04/2013 12:29    MDM   Len ChildsBertha  L Ciaravino is a 80 y.o. female with a PMH of GERD, lumbar spinal stenosis, DM, arthritis, and AMS who presents to the ED for evaluation of a fall.  Rechecks  2:45 PM = Patient asymptomatic.  Resting in bed comfortably.    4:15 PM = Patient able to ambulate with assistance (performed by myself).      Patient evaluated for multiple complaints due to hx of recent mechanical falls.  Head CT negative for an acute intracranial process.  Cervical spine CT shows mild degenerative changes with no acute pathology.  Patient also complained of hip pain with no evidence of fx/malalignment on x-rays.  She also complained of back pain and was found to have acute and chronic compression fx of the thoracic and lumbar spine (acute T12 without retropulsion, acute L2 with 4.4 mm of retropulsion, chronic L1 with 8 mm of retropulsion and moderate canal stenosis). Dr. Ethelda ChickJacubowitz called radiology who stats that this is a stable fracture. Spoke with husband who states that his wife can follow-up with Dr. Newell CoralNudelman who performed her back surgery. Patient able to ambulate with assistance.  Pain well controlled without intervention. Labs otherwise unremarkable. No evidence of UTI. Patient at baseline per husband who is present in  the ED. Patient discharged home to assisted living facility.  Instructed to follow-up with her orthopedic physician as well as her PCP. Return precautions, discharge instructions, and follow-up was discussed with the patient before discharge.     Discharge Medication List as of 09/04/2013  4:34 PM      Final impressions: 1. Fall   2. Head injury, closed, without LOC   3. Back pain   4. Hip pain   5. Neck pain   6. Compression fracture of lumbar vertebra   7. Compression fracture of thoracic vertebra      Luiz Iron PA-C   This patient was discussed with Dr. Barbarann Ehlers, PA-C 09/05/13 [phone]

## 2013-09-04 NOTE — ED Notes (Addendum)
[facility] Spoke to [employer]Jenny RN. Pt to be take to room 34.

## 2013-09-05 NOTE — ED Provider Notes (Signed)
Medical screening examination/treatment/procedure(s) were conducted as a shared visit with non-physician practitioner(s) and myself.  I personally evaluated the patient during the encounter.  EKG Interpretation    Date/Time:  Monday September 04 2013 12:41:00 EST Ventricular Rate:  66 PR Interval:  111 QRS Duration: 75 QT Interval:  387 QTC Calculation: 405 R Axis:   62 Text Interpretation:  Sinus rhythm Borderline short PR interval No significant change since last tracing Confirmed by Ethelda ChickJACUBOWITZ  MD, Merwin Breden ([id number]) on 09/04/2013 1:07:00 PM             Doug SouSam Violet Cart, MD 09/05/13 1340

## 2013-09-23 ENCOUNTER — Emergency Department (HOSPITAL_COMMUNITY)
Admission: EM | Admit: 2013-09-23 | Discharge: 2013-09-23 | Disposition: A | Discharge status: Skilled Nursing Facility | Payer: Medicare Other | Attending: Emergency Medicine | Admitting: Emergency Medicine

## 2013-09-23 ENCOUNTER — Emergency Department (HOSPITAL_COMMUNITY): Payer: Medicare Other

## 2013-09-23 ENCOUNTER — Encounter (HOSPITAL_COMMUNITY): Payer: Self-pay | Admitting: Emergency Medicine

## 2013-09-23 DIAGNOSIS — Z8742 Personal history of other diseases of the female genital tract: Secondary | ICD-10-CM | POA: Insufficient documentation

## 2013-09-23 DIAGNOSIS — S0993XA Unspecified injury of face, initial encounter: Secondary | ICD-10-CM | POA: Insufficient documentation

## 2013-09-23 DIAGNOSIS — Z8619 Personal history of other infectious and parasitic diseases: Secondary | ICD-10-CM | POA: Insufficient documentation

## 2013-09-23 DIAGNOSIS — S199XXA Unspecified injury of neck, initial encounter: Secondary | ICD-10-CM

## 2013-09-23 DIAGNOSIS — G589 Mononeuropathy, unspecified: Secondary | ICD-10-CM | POA: Insufficient documentation

## 2013-09-23 DIAGNOSIS — Z7982 Long term (current) use of aspirin: Secondary | ICD-10-CM | POA: Insufficient documentation

## 2013-09-23 DIAGNOSIS — M25552 Pain in left hip: Secondary | ICD-10-CM

## 2013-09-23 DIAGNOSIS — F329 Major depressive disorder, single episode, unspecified: Secondary | ICD-10-CM | POA: Insufficient documentation

## 2013-09-23 DIAGNOSIS — M19049 Primary osteoarthritis, unspecified hand: Secondary | ICD-10-CM | POA: Insufficient documentation

## 2013-09-23 DIAGNOSIS — S79929A Unspecified injury of unspecified thigh, initial encounter: Principal | ICD-10-CM

## 2013-09-23 DIAGNOSIS — Y9289 Other specified places as the place of occurrence of the external cause: Secondary | ICD-10-CM | POA: Insufficient documentation

## 2013-09-23 DIAGNOSIS — Z79899 Other long term (current) drug therapy: Secondary | ICD-10-CM | POA: Insufficient documentation

## 2013-09-23 DIAGNOSIS — E119 Type 2 diabetes mellitus without complications: Secondary | ICD-10-CM | POA: Insufficient documentation

## 2013-09-23 DIAGNOSIS — F3289 Other specified depressive episodes: Secondary | ICD-10-CM | POA: Insufficient documentation

## 2013-09-23 DIAGNOSIS — K219 Gastro-esophageal reflux disease without esophagitis: Secondary | ICD-10-CM | POA: Insufficient documentation

## 2013-09-23 DIAGNOSIS — Y939 Activity, unspecified: Secondary | ICD-10-CM | POA: Insufficient documentation

## 2013-09-23 DIAGNOSIS — IMO0002 Reserved for concepts with insufficient information to code with codable children: Secondary | ICD-10-CM | POA: Insufficient documentation

## 2013-09-23 DIAGNOSIS — Z794 Long term (current) use of insulin: Secondary | ICD-10-CM | POA: Insufficient documentation

## 2013-09-23 DIAGNOSIS — W19XXXA Unspecified fall, initial encounter: Secondary | ICD-10-CM

## 2013-09-23 DIAGNOSIS — S79919A Unspecified injury of unspecified hip, initial encounter: Secondary | ICD-10-CM | POA: Insufficient documentation

## 2013-09-23 DIAGNOSIS — Z8739 Personal history of other diseases of the musculoskeletal system and connective tissue: Secondary | ICD-10-CM | POA: Insufficient documentation

## 2013-09-23 DIAGNOSIS — R296 Repeated falls: Secondary | ICD-10-CM | POA: Insufficient documentation

## 2013-09-23 DIAGNOSIS — M129 Arthropathy, unspecified: Secondary | ICD-10-CM | POA: Insufficient documentation

## 2013-09-23 LAB — CBC WITH DIFFERENTIAL/PLATELET
BASOS ABS: 0 10*3/uL (ref 0.0–0.1)
BASOS PCT: 0 % (ref 0–1)
EOS PCT: 1 % (ref 0–5)
Eosinophils Absolute: 0.1 10*3/uL (ref 0.0–0.7)
HEMATOCRIT: 40.7 % (ref 36.0–46.0)
Hemoglobin: 13.3 g/dL (ref 12.0–15.0)
Lymphocytes Relative: 32 % (ref 12–46)
Lymphs Abs: 2 10*3/uL (ref 0.7–4.0)
MCH: 27.4 pg (ref 26.0–34.0)
MCHC: 32.7 g/dL (ref 30.0–36.0)
MCV: 83.7 fL (ref 78.0–100.0)
MONO ABS: 0.5 10*3/uL (ref 0.1–1.0)
Monocytes Relative: 7 % (ref 3–12)
Neutro Abs: 3.8 10*3/uL (ref 1.7–7.7)
Neutrophils Relative %: 60 % (ref 43–77)
PLATELETS: 218 10*3/uL (ref 150–400)
RBC: 4.86 MIL/uL (ref 3.87–5.11)
RDW: 13.7 % (ref 11.5–15.5)
WBC: 6.3 10*3/uL (ref 4.0–10.5)

## 2013-09-23 LAB — URINALYSIS, ROUTINE W REFLEX MICROSCOPIC
Bilirubin Urine: NEGATIVE
Glucose, UA: NEGATIVE mg/dL
Hgb urine dipstick: NEGATIVE
KETONES UR: NEGATIVE mg/dL
LEUKOCYTES UA: NEGATIVE
NITRITE: NEGATIVE
PROTEIN: NEGATIVE mg/dL
Specific Gravity, Urine: 1.01 (ref 1.005–1.030)
UROBILINOGEN UA: 0.2 mg/dL (ref 0.0–1.0)
pH: 7.5 (ref 5.0–8.0)

## 2013-09-23 LAB — BASIC METABOLIC PANEL
BUN: 16 mg/dL (ref 6–23)
CALCIUM: 9.4 mg/dL (ref 8.4–10.5)
CO2: 33 mEq/L — ABNORMAL HIGH (ref 19–32)
CREATININE: 0.59 mg/dL (ref 0.50–1.10)
Chloride: 95 mEq/L — ABNORMAL LOW (ref 96–112)
GFR calc Af Amer: >90 mL/min (ref >90)
GFR, EST NON AFRICAN AMERICAN: 86 mL/min — AB (ref >90)
Glucose, Bld: 84 mg/dL (ref 70–99)
Potassium: 3.5 mEq/L — ABNORMAL LOW (ref 3.7–5.3)
SODIUM: 138 meq/L (ref 137–147)

## 2013-09-23 LAB — CK: Total CK: 60 U/L (ref 7–177)

## 2013-09-23 MED ORDER — ACETAMINOPHEN 325 MG PO TABS
650.0000 mg | ORAL_TABLET | Freq: Once | ORAL | Status: AC
Start: 2013-09-23 — End: 2013-09-23
  Administered 2013-09-23: 650 mg via ORAL
  Filled 2013-09-23: qty 2

## 2013-09-23 NOTE — ED Notes (Signed)
Pt able to ambulate with a walker with no difficulty. Dr. Loretha StaplerWofford made aware.

## 2013-09-23 NOTE — ED Notes (Addendum)
[facility] (physical sciences)( [name] c/o a fall around 2pm this evening, left hip pain and sacral pain. Pt has hx of chronic lumbar pain with lumbar fusion. Pt alert and oriented  Per baseline.

## 2013-09-23 NOTE — ED Provider Notes (Signed)
CSN: [id number]     Arrival date & time 09/23/13  1641 History   First MD Initiated Contact with Patient 09/23/13 1642     Chief Complaint  Patient presents with  . Fall     (Consider location/radiation/quality/duration/timing/severity/associated sxs/prior Treatment) HPI Comments: 80 yo female presenting after an apparent mechanical fall.  She reports she was trying to lock out her walker when her right knee gave out on her.  She states she was on the floor for several hours before help was able to get to her.  She complains of left hip and mid back pain.  She denies LOC.  Patient is a 80 y.o. female presenting with fall.  Fall This is a recurrent problem. Episode onset: several hours ago. Episode frequency: once. The problem has been resolved. Pertinent negatives include no chest pain, no abdominal pain, no headaches and no shortness of breath. Nothing aggravates the symptoms. Nothing relieves the symptoms.    Past Medical History  Diagnosis Date  . Incontinence of urine   . Diarrhea   . Neuropathy     bilateral feet  . GERD (gastroesophageal reflux disease)   . Depression   . Urinary, incontinence, stress female   . History of shingles 2009  . Degenerative lumbar spinal stenosis   . Type II diabetes mellitus     Type 2 IDDM  X 40 years;   . Arthritis     lower back/hands  . Altered mental status 01/13/2013   Past Surgical History  Procedure Laterality Date  . Incontinence surgery  2005  . Tonsillectomy  1964  . Abdominal hysterectomy  1982  . Ovarian cyst surgery  1972  . Cholecystectomy  1988  . Cystoscopy  2009    macroplastique inj  . Rectocele repair  10/15/2011    Procedure: POSTERIOR REPAIR (RECTOCELE);  Surgeon: Kathi Ludwig, MD;  Location: [facility];  Service: Urology;;  posterior vaginal vault repair with sacral spinus repair with graft  . Cystoscopy  10/15/2011    Procedure: CYSTOSCOPY;  Surgeon: Kathi Ludwig, MD;  Location: [facility];  Service: Urology;  Laterality: N/A;  . Flexible sigmoidoscopy  02/28/2012    Procedure: FLEXIBLE SIGMOIDOSCOPY;  Surgeon: Willis Modena, MD;  Location: [facility];  Service: Endoscopy;  Laterality: N/A;  . Breast biopsy  1990    bilaterally  . Cataract extraction w/ intraocular lens implant    . Posterior fusion lumbar spine  05/09/2012   Family History  Problem Relation Age of Onset  . Osteoarthritis Sister   . Osteoarthritis Brother    History  Substance Use Topics  . Smoking status: Never Smoker   . Smokeless tobacco: Never Used  . Alcohol Use: No   OB History   Grav Para Term Preterm Abortions TAB SAB Ect Mult Living                 Review of Systems  Respiratory: Negative for shortness of breath.   Cardiovascular: Negative for chest pain.  Gastrointestinal: Negative for abdominal pain.  Neurological: Negative for headaches.  All other systems reviewed and are negative.      Allergies  Ciprofloxacin; Neosporin; and Sulfa antibiotics  Home Medications   Current Outpatient Rx  Name  Route  Sig  Dispense  Refill  . acetaminophen (TYLENOL) 325 MG tablet   Oral   Take 650 mg by mouth every 6 (six) hours as needed. For pain         .  amiloride-hydrochlorothiazide (MODURETIC) 5-50 MG tablet   Oral   Take 0.5 tablets by mouth daily.         Marland Kitchen aspirin EC 81 MG tablet   Oral   Take 81 mg by mouth daily.         . calcium citrate-vitamin D (CITRACAL+D) 315-200 MG-UNIT per tablet   Oral   Take 2 tablets by mouth daily.         . cholecalciferol (VITAMIN D) 1000 UNITS tablet   Oral   Take 1,000 Units by mouth daily.         . DULoxetine (CYMBALTA) 30 MG capsule   Oral   Take 30 mg by mouth daily.         . fexofenadine (ALLEGRA) 180 MG tablet   Oral   Take 180 mg by mouth daily.         Marland Kitchen GLUCERNA (GLUCERNA) LIQD   Oral   Take 237 mLs by mouth 3 (three) times daily. Vanilla only         . insulin aspart (NOVOLOG)  100 UNIT/ML injection   Subcutaneous   Inject 6 Units into the skin 3 (three) times daily before meals.          . insulin glargine (LANTUS) 100 UNIT/ML injection   Subcutaneous   Inject 25 Units into the skin at bedtime.          . iron polysaccharides (NIFEREX) 150 MG capsule   Oral   Take 150 mg by mouth 2 (two) times daily.         Marland Kitchen LORazepam (ATIVAN) 0.5 MG tablet   Oral   Take 1 tablet (0.5 mg total) by mouth every 12 (twelve) hours. For anxiety.   10 tablet   0   . morphine (MS CONTIN) 15 MG 12 hr tablet   Oral   Take 15 mg by mouth every 12 (twelve) hours.         Marland Kitchen omeprazole (PRILOSEC) 20 MG capsule   Oral   Take 20 mg by mouth daily.         . ondansetron (ZOFRAN) 4 MG tablet   Oral   Take 4 mg by mouth every 8 (eight) hours as needed for nausea or vomiting.         Marland Kitchen oxybutynin (DITROPAN-XL) 5 MG 24 hr tablet   Oral   Take 5 mg by mouth daily.          Marland Kitchen oxycodone (OXY-IR) 5 MG capsule   Oral   Take 5 mg by mouth every 4 (four) hours as needed for pain.         . polyethylene glycol (MIRALAX / GLYCOLAX) packet   Oral   Take 17 g by mouth daily.          . polyvinyl alcohol (LIQUIFILM TEARS) 1.4 % ophthalmic solution   Both Eyes   Place 1 drop into both eyes 3 (three) times daily.         . potassium chloride SA (K-DUR,KLOR-CON) 20 MEQ tablet   Oral   Take 1 tablet (20 mEq total) by mouth daily.   30 tablet   11   . senna-docusate (SENNA-S) 8.6-50 MG per tablet   Oral   Take 2 tablets by mouth at bedtime.         Marland Kitchen olopatadine (PATANOL) 0.1 % ophthalmic solution   Both Eyes   Place 1 drop into both eyes 2 (two) times daily as needed (  itching/watery eyes).           BP 165/66  Pulse 70  Temp(Src) 98.4 F (36.9 C) (Oral)  Resp 17  SpO2 97% Physical Exam  Nursing note and vitals reviewed. Constitutional: She is oriented to person, place, and time. She appears well-developed and well-nourished. No distress.  HENT:   Head: Normocephalic and atraumatic. Head is without raccoon's eyes and without Battle's sign.  Nose: Nose normal.  Eyes: Conjunctivae and EOM are normal. Pupils are equal, round, and reactive to light. No scleral icterus.  Neck: No spinous process tenderness and no muscular tenderness present.  Cardiovascular: Normal rate, regular rhythm, normal heart sounds and intact distal pulses.   No murmur heard. Pulmonary/Chest: Effort normal and breath sounds normal. She has no rales. She exhibits no tenderness.  Abdominal: Soft. There is no tenderness. There is no rebound and no guarding.  Musculoskeletal: Normal range of motion. She exhibits no edema and no tenderness.       Thoracic back: She exhibits bony tenderness. She exhibits no tenderness.       Lumbar back: She exhibits bony tenderness. She exhibits no tenderness.  No evidence of trauma to extremities, except as noted.  2+ distal pulses.    Neurological: She is alert and oriented to person, place, and time.  Skin: Skin is warm and dry. No rash noted.  Psychiatric: She has a normal mood and affect.    ED Course  Procedures (including critical care time) Labs Review Labs Reviewed  BASIC METABOLIC PANEL - Abnormal; Notable for the following:    Potassium 3.5 (*)    Chloride 95 (*)    CO2 33 (*)    GFR calc non Af Amer 86 (*)    All other components within normal limits  CBC WITH DIFFERENTIAL  URINALYSIS, ROUTINE W REFLEX MICROSCOPIC  CK   Imaging Review Dg Thoracic Spine 2 View  09/23/2013   CLINICAL DATA:  Fall complaining of back pain.  EXAM: THORACIC SPINE - 2 VIEW  COMPARISON:  Thoracic spine radiograph 09/04/2013.  FINDINGS: Again noted is a compression fracture at T12 which is age indeterminate, similar to recent study of 09/04/2013, but new compared to more remote prior study from 12/20/2012. At that level, there is approximately 40% loss of anterior vertebral body height (unchanged). No new acute displaced fracture or  compression type fracture in the thoracic spine is otherwise noted. Alignment is anatomic. Orthopedic fixation hardware in the lumbar spine is incompletely visualized.  IMPRESSION: 1. No significant change in the appearance of the thoracic spine compared to recent prior study 09/04/2013, as above.   Electronically Signed   By: Trudie Reedaniel  Entrikin M.D.   On: 09/23/2013 17:55   Dg Lumbar Spine Complete  09/23/2013   CLINICAL DATA:  History of fall. Generalized back and left hip pain.  EXAM: LUMBAR SPINE - COMPLETE 4+ VIEW  COMPARISON:  Multiple priors, most recently CT of the lumbar spine 09/04/2013.  FINDINGS: Again noted are postoperative changes of PLIF from L3-L5. The bones of the lumbar spine are relatively osteopenic. In particular, there is osteopenia of L2 at site of prior acute compression fracture. Old compression fracture at L1 with a vertebra plana appearance, and old compression fracture of T12 with approximately 30% loss of anterior vertebral body height is unchanged compared to prior studies. Rotatory levoscoliosis of the lumbar spine convex to the left at the level of L3 is similar to prior studies. Surgical clips project over the right upper quadrant of  the abdomen, compatible prior cholecystectomy. Atherosclerosis.  IMPRESSION: 1. Similar appearance of the lumbar spine when compared to recent CT scan 09/04/2013, as detailed above.   Electronically Signed   By: Trudie Reed M.D.   On: 09/23/2013 18:02   Dg Hip Complete Left  09/23/2013   CLINICAL DATA:  Left hip pain after fall.  EXAM: LEFT HIP - COMPLETE 2+ VIEW  COMPARISON:  None.  FINDINGS: No fracture or dislocation is noted. Irregularity and sclerosis is seen involving the left femoral head consistent with avascular necrosis. Mild degenerative joint disease of right hip is noted.  IMPRESSION: Avascular necrosis of left femoral head. No fracture or dislocation is noted.   Electronically Signed   By: Roque Lias M.D.   On: 09/23/2013 17:54    Ct Head Wo Contrast  09/23/2013   CLINICAL DATA:  Post fall, now with headache and neck pain  EXAM: CT HEAD WITHOUT CONTRAST  CT CERVICAL SPINE WITHOUT CONTRAST  TECHNIQUE: Multidetector CT imaging of the head and cervical spine was performed following the standard protocol without intravenous contrast. Multiplanar CT image reconstructions of the cervical spine were also generated.  COMPARISON:  CT HEAD W/O CM dated 09/04/2013; CT HEAD W/O CM dated 01/13/2013  FINDINGS: CT HEAD FINDINGS  Persistent findings of atrophy with mild diffuse sulcal prominence centralized volume loss with commensurate ex vacuo dilatation of the ventricular system. Scattered minimal periventricular hypodensities compatible with microvascular ischemic disease. The gray-white differentiation is otherwise well maintained without CT evidence of acute large territory infarct. No intraparenchymal extra-axial mass or hemorrhage. Unchanged size and configuration of the ventricles and basilar cisterns. No midline shift. Intracranial atherosclerosis. There is minimal mucosal thickening within the bilateral maxillary sinuses. The remaining paranasal sinuses and mastoid air cells are normally aerated. No air-fluid levels. Regional soft tissues appear normal. Post bilateral cataract surgery.  CT CERVICAL SPINE FINDINGS  C1 to the superior endplate of T2 is imaged.  Normal alignment of the cervical spine. No anterolisthesis or retrolisthesis. The bilateral facets are normally aligned. The dens is normally positioned in the lateral masses of C1. There is mild degenerative change of the atlantodental articulation. Normal atlantoaxial articulations.  No fracture or static subluxation of cervical spine. Cervical vertebral body heights are normal. Prevertebral soft tissues are normal.  There is mild multilevel cervical spine DDD, worse at C4-C5 and C5-C6 with disc space height loss, endplate irregularity and prominent anteriorly directed disc osteophyte  complexes at these locations.  Atherosclerotic plaque within the bilateral carotid bulbs. Regional soft tissues are otherwise normal. No bulky cervical lymphadenopathy on this noncontrast examination. Normal noncontrast appearance of the thyroid gland. Limited visualization the lung apices is normal.  IMPRESSION: 1. Mild atrophy and microvascular ischemic disease without acute intracranial process. 2. No fracture or static subluxation of the cervical spine. 3. Mild multilevel cervical spine DDD, worse at C4-C5 and C5-C6.   Electronically Signed   By: Simonne Come M.D.   On: 09/23/2013 18:12   Ct Cervical Spine Wo Contrast  09/23/2013   CLINICAL DATA:  Post fall, now with headache and neck pain  EXAM: CT HEAD WITHOUT CONTRAST  CT CERVICAL SPINE WITHOUT CONTRAST  TECHNIQUE: Multidetector CT imaging of the head and cervical spine was performed following the standard protocol without intravenous contrast. Multiplanar CT image reconstructions of the cervical spine were also generated.  COMPARISON:  CT HEAD W/O CM dated 09/04/2013; CT HEAD W/O CM dated 01/13/2013  FINDINGS: CT HEAD FINDINGS  Persistent findings of atrophy with  mild diffuse sulcal prominence centralized volume loss with commensurate ex vacuo dilatation of the ventricular system. Scattered minimal periventricular hypodensities compatible with microvascular ischemic disease. The gray-white differentiation is otherwise well maintained without CT evidence of acute large territory infarct. No intraparenchymal extra-axial mass or hemorrhage. Unchanged size and configuration of the ventricles and basilar cisterns. No midline shift. Intracranial atherosclerosis. There is minimal mucosal thickening within the bilateral maxillary sinuses. The remaining paranasal sinuses and mastoid air cells are normally aerated. No air-fluid levels. Regional soft tissues appear normal. Post bilateral cataract surgery.  CT CERVICAL SPINE FINDINGS  C1 to the superior endplate of T2 is  imaged.  Normal alignment of the cervical spine. No anterolisthesis or retrolisthesis. The bilateral facets are normally aligned. The dens is normally positioned in the lateral masses of C1. There is mild degenerative change of the atlantodental articulation. Normal atlantoaxial articulations.  No fracture or static subluxation of cervical spine. Cervical vertebral body heights are normal. Prevertebral soft tissues are normal.  There is mild multilevel cervical spine DDD, worse at C4-C5 and C5-C6 with disc space height loss, endplate irregularity and prominent anteriorly directed disc osteophyte complexes at these locations.  Atherosclerotic plaque within the bilateral carotid bulbs. Regional soft tissues are otherwise normal. No bulky cervical lymphadenopathy on this noncontrast examination. Normal noncontrast appearance of the thyroid gland. Limited visualization the lung apices is normal.  IMPRESSION: 1. Mild atrophy and microvascular ischemic disease without acute intracranial process. 2. No fracture or static subluxation of the cervical spine. 3. Mild multilevel cervical spine DDD, worse at C4-C5 and C5-C6.   Electronically Signed   By: Simonne Come M.D.   On: 09/23/2013 18:12   Dg Foot Complete Left  09/23/2013   CLINICAL DATA:  Generalized left foot pain, fall  EXAM: LEFT FOOT - COMPLETE 3+ VIEW  COMPARISON:  None.  FINDINGS: There is no evidence of fracture or dislocation. There is no evidence of arthropathy or other focal bone abnormality. The bones appear osteopenic. Atherosclerotic calcifications noted in the small vessels of the foot and ankle. Soft tissues are unremarkable.  IMPRESSION: 1. No acute osseous abnormality. 2. Osteopenia. 3. Atherosclerotic vascular calcifications.   Electronically Signed   By: Malachy Moan M.D.   On: 09/23/2013 18:18  All radiology studies independently viewed by me.     EKG Interpretation    Date/Time:  Saturday September 23 2013 18:10:42 EST Ventricular Rate:   70 PR Interval:  110 QRS Duration: 70 QT Interval:  375 QTC Calculation: 405 R Axis:   56 Text Interpretation:  Sinus rhythm No significant change was found Confirmed by [facility]  MD, TREY ([id number]) on 09/23/2013 7:02:38 PM            MDM   Final diagnoses:  Fall  Left hip pain    80 yo female presenting after a fall.  Described as mechanical.  Has left hip and mid back pain.  Also has mild pain on left lateral neck.    Her workup did not demonstrate any significant acute injuries.  Imaging did demonstrate avascular necrosis on left hip.  She has a hx of chronic hip pain.  Will refer to orthopedics.  She was able to ambulate with her walker (her baseline).  Plan dc back to nursing home.  Advised close PCP follow up as well.    Candyce Churn III, MD 09/23/13 2059

## 2013-09-23 NOTE — ED Notes (Signed)
[facility] called for transport.  

## 2013-09-23 NOTE — Discharge Instructions (Signed)
Fall Prevention and Home Safety Falls cause injuries and can affect all age groups. It is possible to use preventive measures to significantly decrease the likelihood of falls. There are many simple measures which can make your home safer and prevent falls. OUTDOORS  Repair cracks and edges of walkways and driveways.  Remove high doorway thresholds.  Trim shrubbery on the main path into your home.  Have good outside lighting.  Clear walkways of tools, rocks, debris, and clutter.  Check that handrails are not broken and are securely fastened. Both sides of steps should have handrails.  Have leaves, snow, and ice cleared regularly.  Use sand or salt on walkways during winter months.  In the garage, clean up grease or oil spills. BATHROOM  Install night lights.  Install grab bars by the toilet and in the tub and shower.  Use non-skid mats or decals in the tub or shower.  Place a plastic non-slip stool in the shower to sit on, if needed.  Keep floors dry and clean up all water on the floor immediately.  Remove soap buildup in the tub or shower on a regular basis.  Secure bath mats with non-slip, double-sided rug tape.  Remove throw rugs and tripping hazards from the floors. BEDROOMS  Install night lights.  Make sure a bedside light is easy to reach.  Do not use oversized bedding.  Keep a telephone by your bedside.  Have a firm chair with side arms to use for getting dressed.  Remove throw rugs and tripping hazards from the floor. KITCHEN  Keep handles on pots and pans turned toward the center of the stove. Use back burners when possible.  Clean up spills quickly and allow time for drying.  Avoid walking on wet floors.  Avoid hot utensils and knives.  Position shelves so they are not too high or low.  Place commonly used objects within easy reach.  If necessary, use a sturdy step stool with a grab bar when reaching.  Keep electrical cables out of the  way.  Do not use floor polish or wax that makes floors slippery. If you must use wax, use non-skid floor wax.  Remove throw rugs and tripping hazards from the floor. STAIRWAYS  Never leave objects on stairs.  Place handrails on both sides of stairways and use them. Fix any loose handrails. Make sure handrails on both sides of the stairways are as long as the stairs.  Check carpeting to make sure it is firmly attached along stairs. Make repairs to worn or loose carpet promptly.  Avoid placing throw rugs at the top or bottom of stairways, or properly secure the rug with carpet tape to prevent slippage. Get rid of throw rugs, if possible.  Have an electrician put in a light switch at the top and bottom of the stairs. OTHER FALL PREVENTION TIPS  Wear low-heel or rubber-soled shoes that are supportive and fit well. Wear closed toe shoes.  When using a stepladder, make sure it is fully opened and both spreaders are firmly locked. Do not climb a closed stepladder.  Add color or contrast paint or tape to grab bars and handrails in your home. Place contrasting color strips on first and last steps.  Learn and use mobility aids as needed. Install an electrical emergency response system.  Turn on lights to avoid dark areas. Replace light bulbs that burn out immediately. Get light switches that glow.  Arrange furniture to create clear pathways. Keep furniture in the same place.  Firmly attach carpet with non-skid or double-sided tape.  Eliminate uneven floor surfaces.  Select a carpet pattern that does not visually hide the edge of steps.  Be aware of all pets. OTHER HOME SAFETY TIPS  Set the water temperature for 120 F (48.8 C).  Keep emergency numbers on or near the telephone.  Keep smoke detectors on every level of the home and near sleeping areas. Document Released: 07/17/2002 Document Revised: 01/26/2012 Document Reviewed: 10/16/2011 [facility] Patient Information 2014  [place], [place].  Musculoskeletal Pain Musculoskeletal pain is muscle and boney aches and pains. These pains can occur in any part of the body. Your caregiver may treat you without knowing the cause of the pain. They may treat you if blood or urine tests, X-rays, and other tests were normal.  CAUSES There is often not a definite cause or reason for these pains. These pains may be caused by a type of germ (virus). The discomfort may also come from overuse. Overuse includes working out too hard when your body is not fit. Boney aches also come from weather changes. Bone is sensitive to atmospheric pressure changes. HOME CARE INSTRUCTIONS   Ask when your test results will be ready. Make sure you get your test results.  Only take over-the-counter or prescription medicines for pain, discomfort, or fever as directed by your caregiver. If you were given medications for your condition, do not drive, operate machinery or power tools, or sign legal documents for 24 hours. Do not drink alcohol. Do not take sleeping pills or other medications that may interfere with treatment.  Continue all activities unless the activities cause more pain. When the pain lessens, slowly resume normal activities. Gradually increase the intensity and duration of the activities or exercise.  During periods of severe pain, bed rest may be helpful. Lay or sit in any position that is comfortable.  Putting ice on the injured area.  Put ice in a bag.  Place a towel between your skin and the bag.  Leave the ice on for 15 to 20 minutes, 3 to 4 times a day.  Follow up with your caregiver for continued problems and no reason can be found for the pain. If the pain becomes worse or does not go away, it may be necessary to repeat tests or do additional testing. Your caregiver may need to look further for a possible cause. SEEK IMMEDIATE MEDICAL CARE IF:  You have pain that is getting worse and is not relieved by medications.  You  develop chest pain that is associated with shortness or breath, sweating, feeling sick to your stomach (nauseous), or throw up (vomit).  Your pain becomes localized to the abdomen.  You develop any new symptoms that seem different or that concern you. MAKE SURE YOU:   Understand these instructions.  Will watch your condition.  Will get help right away if you are not doing well or get worse. Document Released: 07/27/2005 Document Revised: 10/19/2011 Document Reviewed: 03/31/2013 [facility] Patient Information 2014 [place], [place].  Hip Pain The hips join the upper legs to the lower pelvis. The bones, cartilage, tendons, and muscles of the hip joint perform a lot of work each day holding your body weight and allowing you to move around. Hip pain is a common symptom. It can range from a minor ache to severe pain on 1 or both hips. Pain may be felt on the inside of the hip joint near the groin, or the outside near the buttocks and upper thigh. There  may be swelling or stiffness as well. It occurs more often when a person walks or performs activity. There are many reasons hip pain can develop. CAUSES  It is important to work with your caregiver to identify the cause since many conditions can impact the bones, cartilage, muscles, and tendons of the hips. Causes for hip pain include:  Broken (fractured) bones.  Separation of the thighbone from the hip socket (dislocation).  Torn cartilage of the hip joint.  Swelling (inflammation) of a tendon (tendonitis), the sac within the hip joint (bursitis), or a joint.  A weakening in the abdominal wall (hernia), affecting the nerves to the hip.  Arthritis in the hip joint or lining of the hip joint.  Pinched nerves in the back, hip, or upper thigh.  A bulging disc in the spine (herniated disc).  Rarely, bone infection or cancer. DIAGNOSIS  The location of your hip pain will help your caregiver understand what may be causing the pain. A diagnosis  is based on your medical history, your symptoms, results from your physical exam, and results from diagnostic tests. Diagnostic tests may include X-ray exams, a computerized magnetic scan (magnetic resonance imaging, MRI), or bone scan. TREATMENT  Treatment will depend on the cause of your hip pain. Treatment may include:  Limiting activities and resting until symptoms improve.  Crutches or other walking supports (a cane or brace).  Ice, elevation, and compression.  Physical therapy or home exercises.  Shoe inserts or special shoes.  Losing weight.  Medications to reduce pain.  Undergoing surgery. HOME CARE INSTRUCTIONS   Only take over-the-counter or prescription medicines for pain, discomfort, or fever as directed by your caregiver.  Put ice on the injured area:  Put ice in a plastic bag.  Place a towel between your skin and the bag.  Leave the ice on for 15-20 minutes at a time, 03-04 times a day.  Keep your leg raised (elevated) when possible to lessen swelling.  Avoid activities that cause pain.  Follow specific exercises as directed by your caregiver.  Sleep with a pillow between your legs on your most comfortable side.  Record how often you have hip pain, the location of the pain, and what it feels like. This information may be helpful to you and your caregiver.  Ask your caregiver about returning to work or sports and whether you should drive.  Follow up with your caregiver for further exams, therapy, or testing as directed. SEEK MEDICAL CARE IF:   Your pain or swelling continues or worsens after 1 week.  You are feeling unwell or have chills.  You have increasing difficulty with walking.  You have a loss of sensation or other new symptoms.  You have questions or concerns. SEEK IMMEDIATE MEDICAL CARE IF:   You cannot put weight on the affected hip.  You have fallen.  You have a sudden increase in pain and swelling in your hip.  You have a  fever. MAKE SURE YOU:   Understand these instructions.  Will watch your condition.  Will get help right away if you are not doing well or get worse. Document Released: 01/14/2010 Document Revised: 10/19/2011 Document Reviewed: 01/14/2010 [facility] Patient Information 2014 [place], [place].

## 2013-09-23 NOTE — ED Notes (Signed)
Dr. Wofford at bedside 

## 2013-09-23 NOTE — ED Notes (Signed)
Pt in XRAY will attempt  IV insertion and lab draw upon pt arrival.

## 2013-09-23 NOTE — ED Notes (Signed)
Bed: [id number] Expected date: 09/23/13 Expected time: 4:45 PM Means of arrival: Ambulance Comments: Fall

## 2013-11-16 ENCOUNTER — Encounter: Payer: Self-pay | Admitting: Podiatry

## 2013-11-16 ENCOUNTER — Ambulatory Visit (INDEPENDENT_AMBULATORY_CARE_PROVIDER_SITE_OTHER): Payer: Medicare Other | Admitting: Podiatry

## 2013-11-16 VITALS — BP 149/75 | HR 77 | Resp 16

## 2013-11-16 DIAGNOSIS — B351 Tinea unguium: Secondary | ICD-10-CM

## 2013-11-16 DIAGNOSIS — B350 Tinea barbae and tinea capitis: Secondary | ICD-10-CM

## 2013-11-16 DIAGNOSIS — M79609 Pain in unspecified limb: Secondary | ICD-10-CM

## 2013-11-16 NOTE — Patient Instructions (Signed)
Diabetes and Foot Care Diabetes may cause you to have problems because of poor blood supply (circulation) to your feet and legs. This may cause the skin on your feet to become thinner, break easier, and heal more slowly. Your skin may become dry, and the skin may peel and crack. You may also have nerve damage in your legs and feet causing decreased feeling in them. You may not notice minor injuries to your feet that could lead to infections or more serious problems. Taking care of your feet is one of the most important things you can do for yourself.  HOME CARE INSTRUCTIONS  Wear shoes at all times, even in the house. Do not go barefoot. Bare feet are easily injured.  Check your feet daily for blisters, cuts, and redness. If you cannot see the bottom of your feet, use a mirror or ask someone for help.  Wash your feet with warm water (do not use hot water) and mild soap. Then pat your feet and the areas between your toes until they are completely dry. Do not soak your feet as this can dry your skin.  Apply a moisturizing lotion or petroleum jelly (that does not contain alcohol and is unscented) to the skin on your feet and to dry, brittle toenails. Do not apply lotion between your toes.  Trim your toenails straight across. Do not dig under them or around the cuticle. File the edges of your nails with an emery board or nail file.  Do not cut corns or calluses or try to remove them with medicine.  Wear clean socks or stockings every day. Make sure they are not too tight. Do not wear knee-high stockings since they may decrease blood flow to your legs.  Wear shoes that fit properly and have enough cushioning. To break in new shoes, wear them for just a few hours a day. This prevents you from injuring your feet. Always look in your shoes before you put them on to be sure there are no objects inside.  Do not cross your legs. This may decrease the blood flow to your feet.  If you find a minor scrape,  cut, or break in the skin on your feet, keep it and the skin around it clean and dry. These areas may be cleansed with mild soap and water. Do not cleanse the area with peroxide, alcohol, or iodine.  When you remove an adhesive bandage, be sure not to damage the skin around it.  If you have a wound, look at it several times a day to make sure it is healing.  Do not use heating pads or hot water bottles. They may burn your skin. If you have lost feeling in your feet or legs, you may not know it is happening until it is too late.  Make sure your health care provider performs a complete foot exam at least annually or more often if you have foot problems. Report any cuts, sores, or bruises to your health care provider immediately. SEEK MEDICAL CARE IF:   You have an injury that is not healing.  You have cuts or breaks in the skin.  You have an ingrown nail.  You notice redness on your legs or feet.  You feel burning or tingling in your legs or feet.  You have pain or cramps in your legs and feet.  Your legs or feet are numb.  Your feet always feel cold. SEEK IMMEDIATE MEDICAL CARE IF:   There is increasing redness,   swelling, or pain in or around a wound.  There is a red line that goes up your leg.  Pus is coming from a wound.  You develop a fever or as directed by your health care provider.  You notice a bad smell coming from an ulcer or wound. Document Released: 07/24/2000 Document Revised: 03/29/2013 Document Reviewed: 01/03/2013 [employer] Patient Information 2014 [employer].  

## 2013-11-17 NOTE — Progress Notes (Signed)
Subjective:     Patient ID: Len ChildsBertha L Getchell, female   DOB: 03/13/1933, 80 y.o.   MRN: [id number]  HPI patient presents with thick nail disease 1-5 both feet that is impossible for her to take care of and are painful   Review of Systems     Objective:   Physical Exam Neurovascular status unchanged with thick nailbeds 1-5 both feet that are brittle and painful    Assessment:     I can't nail infection with pain 1-5 both feet    Plan:     Debridement painful nailbeds 1-5 both feet with no bleeding noted

## 2013-11-21 ENCOUNTER — Encounter (HOSPITAL_COMMUNITY): Payer: Medicare Other

## 2013-11-21 ENCOUNTER — Other Ambulatory Visit (HOSPITAL_COMMUNITY): Payer: Self-pay | Admitting: *Deleted

## 2013-11-22 ENCOUNTER — Ambulatory Visit (HOSPITAL_COMMUNITY)
Admission: RE | Admit: 2013-11-22 | Discharge: 2013-11-22 | Disposition: A | Discharge status: Home / Self Care | Payer: Medicare Other | Source: Ambulatory Visit | Attending: Internal Medicine | Admitting: Internal Medicine

## 2013-11-22 ENCOUNTER — Encounter (HOSPITAL_COMMUNITY): Payer: Medicare Other

## 2013-11-22 DIAGNOSIS — Z5181 Encounter for therapeutic drug level monitoring: Secondary | ICD-10-CM | POA: Insufficient documentation

## 2013-11-22 MED ORDER — ZOLEDRONIC ACID 5 MG/100ML IV SOLN
INTRAVENOUS | Status: AC
  Administered 2013-11-22: 5 mg via INTRAVENOUS
  Filled 2013-11-22: qty 100

## 2013-11-22 MED ORDER — ZOLEDRONIC ACID 5 MG/100ML IV SOLN
5.0000 mg | Freq: Once | INTRAVENOUS | Status: AC
  Administered 2013-11-22: 5 mg via INTRAVENOUS

## 2014-01-31 ENCOUNTER — Ambulatory Visit (INDEPENDENT_AMBULATORY_CARE_PROVIDER_SITE_OTHER): Payer: Medicare Other | Admitting: Podiatry

## 2014-01-31 ENCOUNTER — Encounter: Payer: Self-pay | Admitting: Podiatry

## 2014-01-31 DIAGNOSIS — B351 Tinea unguium: Secondary | ICD-10-CM

## 2014-01-31 DIAGNOSIS — M79673 Pain in unspecified foot: Secondary | ICD-10-CM

## 2014-01-31 DIAGNOSIS — M79609 Pain in unspecified limb: Secondary | ICD-10-CM

## 2014-01-31 NOTE — Patient Instructions (Signed)
Diabetes and Foot Care Diabetes may cause you to have problems because of poor blood supply (circulation) to your feet and legs. This may cause the skin on your feet to become thinner, break easier, and heal more slowly. Your skin may become dry, and the skin may peel and crack. You may also have nerve damage in your legs and feet causing decreased feeling in them. You may not notice minor injuries to your feet that could lead to infections or more serious problems. Taking care of your feet is one of the most important things you can do for yourself.  HOME CARE INSTRUCTIONS  Wear shoes at all times, even in the house. Do not go barefoot. Bare feet are easily injured.  Check your feet daily for blisters, cuts, and redness. If you cannot see the bottom of your feet, use a mirror or ask someone for help.  Wash your feet with warm water (do not use hot water) and mild soap. Then pat your feet and the areas between your toes until they are completely dry. Do not soak your feet as this can dry your skin.  Apply a moisturizing lotion or petroleum jelly (that does not contain alcohol and is unscented) to the skin on your feet and to dry, brittle toenails. Do not apply lotion between your toes.  Trim your toenails straight across. Do not dig under them or around the cuticle. File the edges of your nails with an emery board or nail file.  Do not cut corns or calluses or try to remove them with medicine.  Wear clean socks or stockings every day. Make sure they are not too tight. Do not wear knee-high stockings since they may decrease blood flow to your legs.  Wear shoes that fit properly and have enough cushioning. To break in new shoes, wear them for just a few hours a day. This prevents you from injuring your feet. Always look in your shoes before you put them on to be sure there are no objects inside.  Do not cross your legs. This may decrease the blood flow to your feet.  If you find a minor scrape,  cut, or break in the skin on your feet, keep it and the skin around it clean and dry. These areas may be cleansed with mild soap and water. Do not cleanse the area with peroxide, alcohol, or iodine.  When you remove an adhesive bandage, be sure not to damage the skin around it.  If you have a wound, look at it several times a day to make sure it is healing.  Do not use heating pads or hot water bottles. They may burn your skin. If you have lost feeling in your feet or legs, you may not know it is happening until it is too late.  Make sure your health care provider performs a complete foot exam at least annually or more often if you have foot problems. Report any cuts, sores, or bruises to your health care provider immediately. SEEK MEDICAL CARE IF:   You have an injury that is not healing.  You have cuts or breaks in the skin.  You have an ingrown nail.  You notice redness on your legs or feet.  You feel burning or tingling in your legs or feet.  You have pain or cramps in your legs and feet.  Your legs or feet are numb.  Your feet always feel cold. SEEK IMMEDIATE MEDICAL CARE IF:   There is increasing redness,   swelling, or pain in or around a wound.  There is a red line that goes up your leg.  Pus is coming from a wound.  You develop a fever or as directed by your health care provider.  You notice a bad smell coming from an ulcer or wound. Document Released: 07/24/2000 Document Revised: 03/29/2013 Document Reviewed: 01/03/2013 [employer] Patient Information 2015 [employer]. This information is not intended to replace advice given to you by your health care provider. Make sure you discuss any questions you have with your health care provider.  

## 2014-02-01 NOTE — Progress Notes (Signed)
Subjective:     Patient ID: [name], female   DOB: 05/30/1933, 80 y.o.   MRN: [id number]  HPI patient presents stating my nails are thick and I have nowhere cutting them and they become painful   Review of Systems     Objective:   Physical Exam Neurovascular status intact with thick   brittle nailbeds 1-5 both feet with yellow discoloration and pain Assessment:     Mycotic nail infection with pain 1-5 both feet    Plan:     Debris painful nailbeds 1-5 both feet with no iatrogenic bleeding noted

## 2014-03-17 ENCOUNTER — Emergency Department (HOSPITAL_BASED_OUTPATIENT_CLINIC_OR_DEPARTMENT_OTHER): Payer: Medicare Other

## 2014-03-17 ENCOUNTER — Inpatient Hospital Stay (HOSPITAL_BASED_OUTPATIENT_CLINIC_OR_DEPARTMENT_OTHER)
Admission: EM | Admit: 2014-03-17 | Discharge: 2014-03-20 | DRG: 603 | Disposition: A | Discharge status: Skilled Nursing Facility | Payer: Medicare Other | Attending: Internal Medicine | Admitting: Internal Medicine

## 2014-03-17 ENCOUNTER — Encounter (HOSPITAL_BASED_OUTPATIENT_CLINIC_OR_DEPARTMENT_OTHER): Payer: Self-pay | Admitting: Emergency Medicine

## 2014-03-17 DIAGNOSIS — F3289 Other specified depressive episodes: Secondary | ICD-10-CM | POA: Diagnosis present

## 2014-03-17 DIAGNOSIS — L02419 Cutaneous abscess of limb, unspecified: Secondary | ICD-10-CM | POA: Diagnosis present

## 2014-03-17 DIAGNOSIS — L02619 Cutaneous abscess of unspecified foot: Secondary | ICD-10-CM | POA: Diagnosis present

## 2014-03-17 DIAGNOSIS — M009 Pyogenic arthritis, unspecified: Secondary | ICD-10-CM | POA: Diagnosis present

## 2014-03-17 DIAGNOSIS — M545 Low back pain, unspecified: Secondary | ICD-10-CM

## 2014-03-17 DIAGNOSIS — G8929 Other chronic pain: Secondary | ICD-10-CM | POA: Diagnosis present

## 2014-03-17 DIAGNOSIS — E1169 Type 2 diabetes mellitus with other specified complication: Secondary | ICD-10-CM | POA: Diagnosis present

## 2014-03-17 DIAGNOSIS — Z981 Arthrodesis status: Secondary | ICD-10-CM

## 2014-03-17 DIAGNOSIS — F329 Major depressive disorder, single episode, unspecified: Secondary | ICD-10-CM | POA: Diagnosis present

## 2014-03-17 DIAGNOSIS — Z794 Long term (current) use of insulin: Secondary | ICD-10-CM

## 2014-03-17 DIAGNOSIS — Z888 Allergy status to other drugs, medicaments and biological substances status: Secondary | ICD-10-CM | POA: Diagnosis not present

## 2014-03-17 DIAGNOSIS — Z66 Do not resuscitate: Secondary | ICD-10-CM | POA: Diagnosis present

## 2014-03-17 DIAGNOSIS — R7309 Other abnormal glucose: Secondary | ICD-10-CM | POA: Diagnosis not present

## 2014-03-17 DIAGNOSIS — M79605 Pain in left leg: Secondary | ICD-10-CM

## 2014-03-17 DIAGNOSIS — G589 Mononeuropathy, unspecified: Secondary | ICD-10-CM | POA: Diagnosis present

## 2014-03-17 DIAGNOSIS — E876 Hypokalemia: Secondary | ICD-10-CM | POA: Diagnosis present

## 2014-03-17 DIAGNOSIS — I10 Essential (primary) hypertension: Secondary | ICD-10-CM | POA: Diagnosis present

## 2014-03-17 DIAGNOSIS — Z79899 Other long term (current) drug therapy: Secondary | ICD-10-CM

## 2014-03-17 DIAGNOSIS — G9341 Metabolic encephalopathy: Secondary | ICD-10-CM

## 2014-03-17 DIAGNOSIS — L03115 Cellulitis of right lower limb: Secondary | ICD-10-CM | POA: Diagnosis present

## 2014-03-17 DIAGNOSIS — L03031 Cellulitis of right toe: Secondary | ICD-10-CM

## 2014-03-17 DIAGNOSIS — Z7982 Long term (current) use of aspirin: Secondary | ICD-10-CM

## 2014-03-17 DIAGNOSIS — M869 Osteomyelitis, unspecified: Secondary | ICD-10-CM | POA: Diagnosis present

## 2014-03-17 DIAGNOSIS — K922 Gastrointestinal hemorrhage, unspecified: Secondary | ICD-10-CM

## 2014-03-17 DIAGNOSIS — M9080 Osteopathy in diseases classified elsewhere, unspecified site: Secondary | ICD-10-CM | POA: Diagnosis present

## 2014-03-17 DIAGNOSIS — N390 Urinary tract infection, site not specified: Secondary | ICD-10-CM

## 2014-03-17 DIAGNOSIS — R410 Disorientation, unspecified: Secondary | ICD-10-CM

## 2014-03-17 DIAGNOSIS — K219 Gastro-esophageal reflux disease without esophagitis: Secondary | ICD-10-CM | POA: Diagnosis present

## 2014-03-17 DIAGNOSIS — R739 Hyperglycemia, unspecified: Secondary | ICD-10-CM

## 2014-03-17 DIAGNOSIS — L03119 Cellulitis of unspecified part of limb: Principal | ICD-10-CM

## 2014-03-17 LAB — CBC WITH DIFFERENTIAL/PLATELET
BASOS ABS: 0 10*3/uL (ref 0.0–0.1)
BASOS PCT: 0 % (ref 0–1)
EOS ABS: 0.1 10*3/uL (ref 0.0–0.7)
EOS PCT: 1 % (ref 0–5)
HCT: 35.9 % — ABNORMAL LOW (ref 36.0–46.0)
Hemoglobin: 11.8 g/dL — ABNORMAL LOW (ref 12.0–15.0)
LYMPHS ABS: 1.7 10*3/uL (ref 0.7–4.0)
Lymphocytes Relative: 27 % (ref 12–46)
MCH: 29.1 pg (ref 26.0–34.0)
MCHC: 32.9 g/dL (ref 30.0–36.0)
MCV: 88.6 fL (ref 78.0–100.0)
Monocytes Absolute: 0.7 10*3/uL (ref 0.1–1.0)
Monocytes Relative: 10 % (ref 3–12)
NEUTROS PCT: 61 % (ref 43–77)
Neutro Abs: 3.9 10*3/uL (ref 1.7–7.7)
PLATELETS: 222 10*3/uL (ref 150–400)
RBC: 4.05 MIL/uL (ref 3.87–5.11)
RDW: 13.2 % (ref 11.5–15.5)
WBC: 6.4 10*3/uL (ref 4.0–10.5)

## 2014-03-17 LAB — BASIC METABOLIC PANEL
Anion gap: 8 (ref 5–15)
BUN: 23 mg/dL (ref 6–23)
CALCIUM: 9.6 mg/dL (ref 8.4–10.5)
CO2: 33 mEq/L — ABNORMAL HIGH (ref 19–32)
Chloride: 96 mEq/L (ref 96–112)
Creatinine, Ser: 0.7 mg/dL (ref 0.50–1.10)
GFR, EST NON AFRICAN AMERICAN: 81 mL/min — AB (ref >90)
Glucose, Bld: 282 mg/dL — ABNORMAL HIGH (ref 70–99)
POTASSIUM: 3.9 meq/L (ref 3.7–5.3)
Sodium: 137 mEq/L (ref 137–147)

## 2014-03-17 LAB — GLUCOSE, CAPILLARY
GLUCOSE-CAPILLARY: 96 mg/dL (ref 70–99)
Glucose-Capillary: 250 mg/dL — ABNORMAL HIGH (ref 70–99)

## 2014-03-17 LAB — CBG MONITORING, ED: Glucose-Capillary: 276 mg/dL — ABNORMAL HIGH (ref 70–99)

## 2014-03-17 MED ORDER — ONDANSETRON HCL 4 MG PO TABS: 4.0000 mg | ORAL_TABLET | Freq: Three times a day (TID) | ORAL | Status: DC | PRN

## 2014-03-17 MED ORDER — VANCOMYCIN HCL IN DEXTROSE 1-5 GM/200ML-% IV SOLN
1000.0000 mg | Freq: Once | INTRAVENOUS | Status: AC
  Administered 2014-03-17: 1000 mg via INTRAVENOUS
  Filled 2014-03-17: qty 200

## 2014-03-17 MED ORDER — ENOXAPARIN SODIUM 40 MG/0.4ML ~~LOC~~ SOLN
40.0000 mg | SUBCUTANEOUS | Status: DC
  Administered 2014-03-17 – 2014-03-19 (×3): 40 mg via SUBCUTANEOUS
  Filled 2014-03-17 (×5): qty 0.4

## 2014-03-17 MED ORDER — OLOPATADINE HCL 0.1 % OP SOLN
1.0000 [drp] | Freq: Two times a day (BID) | OPHTHALMIC | Status: DC | PRN
  Filled 2014-03-17: qty 5

## 2014-03-17 MED ORDER — MORPHINE SULFATE ER 15 MG PO TBCR
15.0000 mg | EXTENDED_RELEASE_TABLET | Freq: Two times a day (BID) | ORAL | Status: DC
  Administered 2014-03-17 – 2014-03-20 (×6): 15 mg via ORAL
  Filled 2014-03-17 (×6): qty 1

## 2014-03-17 MED ORDER — INSULIN ASPART 100 UNIT/ML ~~LOC~~ SOLN
0.0000 [IU] | Freq: Three times a day (TID) | SUBCUTANEOUS | Status: DC
  Administered 2014-03-18: 7 [IU] via SUBCUTANEOUS
  Administered 2014-03-18 – 2014-03-19 (×2): 3 [IU] via SUBCUTANEOUS
  Administered 2014-03-19: 7 [IU] via SUBCUTANEOUS
  Administered 2014-03-20: 3 [IU] via SUBCUTANEOUS

## 2014-03-17 MED ORDER — POLYVINYL ALCOHOL 1.4 % OP SOLN
1.0000 [drp] | Freq: Three times a day (TID) | OPHTHALMIC | Status: DC
  Administered 2014-03-17 – 2014-03-20 (×7): 1 [drp] via OPHTHALMIC
  Filled 2014-03-17: qty 15

## 2014-03-17 MED ORDER — LORAZEPAM 0.5 MG PO TABS
0.5000 mg | ORAL_TABLET | Freq: Two times a day (BID) | ORAL | Status: DC
  Administered 2014-03-17 – 2014-03-20 (×6): 0.5 mg via ORAL
  Filled 2014-03-17 (×6): qty 1

## 2014-03-17 MED ORDER — OXYBUTYNIN CHLORIDE ER 5 MG PO TB24
5.0000 mg | ORAL_TABLET | Freq: Every day | ORAL | Status: DC
  Administered 2014-03-18 – 2014-03-20 (×3): 5 mg via ORAL
  Filled 2014-03-17 (×3): qty 1

## 2014-03-17 MED ORDER — OXYCODONE HCL 5 MG PO TABS: 5.0000 mg | ORAL_TABLET | ORAL | Status: DC | PRN

## 2014-03-17 MED ORDER — INSULIN GLARGINE 100 UNIT/ML ~~LOC~~ SOLN
15.0000 [IU] | Freq: Every day | SUBCUTANEOUS | Status: DC
  Filled 2014-03-17: qty 0.15

## 2014-03-17 MED ORDER — POLYETHYLENE GLYCOL 3350 17 G PO PACK
17.0000 g | PACK | Freq: Every day | ORAL | Status: DC
  Administered 2014-03-18: 17 g via ORAL
  Filled 2014-03-17 (×3): qty 1

## 2014-03-17 MED ORDER — OXYCODONE-ACETAMINOPHEN 5-325 MG PO TABS
1.0000 | ORAL_TABLET | Freq: Once | ORAL | Status: AC
  Administered 2014-03-17: 1 via ORAL
  Filled 2014-03-17: qty 1

## 2014-03-17 MED ORDER — HYDROCHLOROTHIAZIDE 25 MG PO TABS
25.0000 mg | ORAL_TABLET | Freq: Every day | ORAL | Status: DC
  Administered 2014-03-18 – 2014-03-20 (×3): 25 mg via ORAL
  Filled 2014-03-17 (×3): qty 1

## 2014-03-17 MED ORDER — MORPHINE SULFATE ER 15 MG PO TBCR: 15.0000 mg | EXTENDED_RELEASE_TABLET | Freq: Two times a day (BID) | ORAL | Status: DC

## 2014-03-17 MED ORDER — PANTOPRAZOLE SODIUM 40 MG PO TBEC
40.0000 mg | DELAYED_RELEASE_TABLET | Freq: Every day | ORAL | Status: DC
  Administered 2014-03-18 – 2014-03-20 (×3): 40 mg via ORAL
  Filled 2014-03-17 (×3): qty 1

## 2014-03-17 MED ORDER — PIPERACILLIN-TAZOBACTAM 3.375 G IVPB
3.3750 g | Freq: Three times a day (TID) | INTRAVENOUS | Status: DC
  Administered 2014-03-17 – 2014-03-20 (×8): 3.375 g via INTRAVENOUS
  Filled 2014-03-17 (×11): qty 50

## 2014-03-17 MED ORDER — ONDANSETRON HCL 4 MG PO TABS: 4.0000 mg | ORAL_TABLET | Freq: Four times a day (QID) | ORAL | Status: DC | PRN

## 2014-03-17 MED ORDER — VANCOMYCIN HCL 500 MG IV SOLR
500.0000 mg | Freq: Two times a day (BID) | INTRAVENOUS | Status: DC
  Administered 2014-03-18 – 2014-03-20 (×5): 500 mg via INTRAVENOUS
  Filled 2014-03-17 (×7): qty 500

## 2014-03-17 MED ORDER — ASPIRIN EC 81 MG PO TBEC
81.0000 mg | DELAYED_RELEASE_TABLET | Freq: Every day | ORAL | Status: DC
  Administered 2014-03-18 – 2014-03-20 (×3): 81 mg via ORAL
  Filled 2014-03-17 (×3): qty 1

## 2014-03-17 MED ORDER — DULOXETINE HCL 30 MG PO CPEP
30.0000 mg | ORAL_CAPSULE | Freq: Every day | ORAL | Status: DC
  Administered 2014-03-18 – 2014-03-20 (×3): 30 mg via ORAL
  Filled 2014-03-17 (×3): qty 1

## 2014-03-17 MED ORDER — SENNOSIDES-DOCUSATE SODIUM 8.6-50 MG PO TABS
2.0000 | ORAL_TABLET | Freq: Every day | ORAL | Status: DC
  Administered 2014-03-17 – 2014-03-19 (×3): 2 via ORAL
  Filled 2014-03-17 (×3): qty 2

## 2014-03-17 MED ORDER — ACETAMINOPHEN 325 MG PO TABS
650.0000 mg | ORAL_TABLET | Freq: Four times a day (QID) | ORAL | Status: DC | PRN
  Administered 2014-03-19: 650 mg via ORAL
  Filled 2014-03-17: qty 2

## 2014-03-17 MED ORDER — GLUCERNA SHAKE PO LIQD
237.0000 mL | Freq: Three times a day (TID) | ORAL | Status: DC
  Administered 2014-03-17 – 2014-03-20 (×6): 237 mL via ORAL
  Filled 2014-03-17 (×9): qty 237

## 2014-03-17 MED ORDER — PIPERACILLIN-TAZOBACTAM 3.375 G IVPB 30 MIN
3.3750 g | Freq: Once | INTRAVENOUS | Status: AC
  Administered 2014-03-17: 3.375 g via INTRAVENOUS
  Filled 2014-03-17 (×2): qty 50

## 2014-03-17 MED ORDER — INSULIN GLARGINE 100 UNIT/ML ~~LOC~~ SOLN
10.0000 [IU] | Freq: Every day | SUBCUTANEOUS | Status: DC
  Administered 2014-03-17: 10 [IU] via SUBCUTANEOUS
  Filled 2014-03-17 (×2): qty 0.1

## 2014-03-17 MED ORDER — ONDANSETRON HCL 4 MG/2ML IJ SOLN: 4.0000 mg | Freq: Four times a day (QID) | INTRAMUSCULAR | Status: DC | PRN

## 2014-03-17 MED ORDER — POLYSACCHARIDE IRON COMPLEX 150 MG PO CAPS
150.0000 mg | ORAL_CAPSULE | Freq: Two times a day (BID) | ORAL | Status: DC
  Administered 2014-03-17 – 2014-03-20 (×5): 150 mg via ORAL
  Filled 2014-03-17 (×8): qty 1

## 2014-03-17 NOTE — H&P (Signed)
[facility] History and Physical  [name] ZOX:[id number] DOB: [birth year] DOA: 03/17/2014  Referring physician: Dr Bebe Shaggy.  PCP: Lillia Mountain, MD   Chief Complaint: right foot pain , swelling.   HPI: [name] is a 80 y.o. female with PMH significant for Diabetes, Hypertension, on Chronic pain medications, who presents to [facility] with Right foot, great toe pain, swelling, redness, that started 5 days prior to admission. Patient relates that the skin of her great toe was pilling, so she started to removed it. No history of fever, no nausea, no vomiting. Denies chest pain or dyspnea. She does relates dysuria.    Review of Systems:  Negative, except as per HPI.   Past Medical History  Diagnosis Date  . Incontinence of urine   . Diarrhea   . Neuropathy     bilateral feet  . GERD (gastroesophageal reflux disease)   . Depression   . Urinary, incontinence, stress female   . History of shingles 2009  . Degenerative lumbar spinal stenosis   . Type II diabetes mellitus     Type 2 IDDM  X 40 years;   . Arthritis     lower back/hands  . Altered mental status 01/13/2013  . Hypertension    Past Surgical History  Procedure Laterality Date  . Incontinence surgery  2005  . Tonsillectomy  1964  . Abdominal hysterectomy  1982  . Ovarian cyst surgery  1972  . Cholecystectomy  1988  . Cystoscopy  2009    macroplastique inj  . Rectocele repair  10/15/2011    Procedure: POSTERIOR REPAIR (RECTOCELE);  Surgeon: Kathi Ludwig, MD;  Location: [facility];  Service: Urology;;  posterior vaginal vault repair with sacral spinus repair with graft  . Cystoscopy  10/15/2011    Procedure: CYSTOSCOPY;  Surgeon: Kathi Ludwig, MD;  Location: [facility];  Service: Urology;  Laterality: N/A;  . Flexible sigmoidoscopy  02/28/2012    Procedure: FLEXIBLE SIGMOIDOSCOPY;  Surgeon: Willis Modena, MD;  Location: [facility];  Service:  Endoscopy;  Laterality: N/A;  . Breast biopsy  1990    bilaterally  . Cataract extraction w/ intraocular lens implant    . Posterior fusion lumbar spine  05/09/2012   Social History:  reports that she has never smoked. She has never used smokeless tobacco. She reports that she does not drink alcohol or use illicit drugs.  Allergies  Allergen Reactions  . Ciprofloxacin Rash    Pt doesn't remember this allergy  . Neosporin [Neomycin-Bacitracin Zn-Polymyx] Rash    Pt doesn't remember this allergy  . Sulfa Antibiotics Rash    Family History  Problem Relation Age of Onset  . Osteoarthritis Sister   . Osteoarthritis Brother      Prior to Admission medications   Medication Sig Start Date End Date Taking? Authorizing Provider  levofloxacin (LEVAQUIN) 500 MG tablet Take 500 mg by mouth daily.   Yes Historical Provider, MD  acetaminophen (TYLENOL) 325 MG tablet Take 650 mg by mouth every 6 (six) hours as needed. For pain    Historical Provider, MD  amiloride-hydrochlorothiazide (MODURETIC) 5-50 MG tablet Take 0.5 tablets by mouth daily.    Historical Provider, MD  aspirin EC 81 MG tablet Take 81 mg by mouth daily.    Historical Provider, MD  calcium citrate-vitamin D (CITRACAL+D) 315-200 MG-UNIT per tablet Take 2 tablets by mouth daily.    Historical Provider, MD  cholecalciferol (VITAMIN D) 1000 UNITS tablet  Take 1,000 Units by mouth daily.    Historical Provider, MD  DULoxetine (CYMBALTA) 30 MG capsule Take 30 mg by mouth daily.    Historical Provider, MD  fexofenadine (ALLEGRA) 180 MG tablet Take 180 mg by mouth daily.    Historical Provider, MD  GLUCERNA (GLUCERNA) LIQD Take 237 mLs by mouth 3 (three) times daily. Vanilla only    Historical Provider, MD  insulin aspart (NOVOLOG) 100 UNIT/ML injection Inject 6 Units into the skin 3 (three) times daily before meals.     Historical Provider, MD  insulin glargine (LANTUS) 100 UNIT/ML injection Inject 25 Units into the skin at bedtime.      Historical Provider, MD  iron polysaccharides (NIFEREX) 150 MG capsule Take 150 mg by mouth 2 (two) times daily.    Historical Provider, MD  LORazepam (ATIVAN) 0.5 MG tablet Take 1 tablet (0.5 mg total) by mouth every 12 (twelve) hours. For anxiety. 05/13/12   Hewitt Shorts, MD  morphine (MS CONTIN) 15 MG 12 hr tablet Take 15 mg by mouth every 12 (twelve) hours.    Historical Provider, MD  olopatadine (PATANOL) 0.1 % ophthalmic solution Place 1 drop into both eyes 2 (two) times daily as needed (itching/watery eyes).     Historical Provider, MD  omeprazole (PRILOSEC) 20 MG capsule Take 20 mg by mouth daily.    Historical Provider, MD  ondansetron (ZOFRAN) 4 MG tablet Take 4 mg by mouth every 8 (eight) hours as needed for nausea or vomiting.    Historical Provider, MD  oxybutynin (DITROPAN-XL) 5 MG 24 hr tablet Take 5 mg by mouth daily.     Historical Provider, MD  oxycodone (OXY-IR) 5 MG capsule Take 5 mg by mouth every 4 (four) hours as needed for pain.    Historical Provider, MD  polyethylene glycol (MIRALAX / GLYCOLAX) packet Take 17 g by mouth daily.  03/03/12   Lillia Mountain, MD  polyvinyl alcohol (LIQUIFILM TEARS) 1.4 % ophthalmic solution Place 1 drop into both eyes 3 (three) times daily.    Historical Provider, MD  senna-docusate (SENNA-S) 8.6-50 MG per tablet Take 2 tablets by mouth at bedtime.    Historical Provider, MD   Physical Exam: Filed Vitals:   03/17/14 1430 03/17/14 1500 03/17/14 1519 03/17/14 1658  BP: 131/53 114/64 122/56 140/53  Pulse: 67 70 68 60  Temp:    97.8 F (36.6 C)  TempSrc:    Oral  Resp:    18  Height:      Weight:      SpO2: 99% 100% 99% 100%    Wt Readings from Last 3 Encounters:  03/17/14 68.04 kg (150 lb)  01/16/13 72.122 kg (159 lb)  05/09/12 72.258 kg (159 lb 4.8 oz)    General:  Appears calm and comfortable, soft speech.  Eyes: PERRL, normal lids, irises & conjunctiva ENT: grossly normal hearing, lips & tongue Neck: no LAD, masses or  thyromegaly Cardiovascular: RRR, no m/r/g.  Respiratory: CTA bilaterally, no w/r/r. Normal respiratory effort. Abdomen: soft, ntnd Skin: right Lower extremity with edema, redness.  Musculoskeletal: Right LE with edema, redness, great toe with swelling , redness area of pus.  Psychiatric; Flat affect.  Neurologic: grossly non-focal. Oriented to person and place not to year.           Labs on Admission:  Basic Metabolic Panel:  Recent Labs Lab 03/17/14 1235  NA 137  K 3.9  CL 96  CO2 33*  GLUCOSE 282*  BUN  23  CREATININE 0.70  CALCIUM 9.6   Liver Function Tests: No results found for this basename: AST, ALT, ALKPHOS, BILITOT, PROT, ALBUMIN,  in the last 168 hours No results found for this basename: LIPASE, AMYLASE,  in the last 168 hours No results found for this basename: AMMONIA,  in the last 168 hours CBC:  Recent Labs Lab 03/17/14 1235  WBC 6.4  NEUTROABS 3.9  HGB 11.8*  HCT 35.9*  MCV 88.6  PLT 222   Cardiac Enzymes: No results found for this basename: CKTOTAL, CKMB, CKMBINDEX, TROPONINI,  in the last 168 hours  BNP (last 3 results) No results found for this basename: PROBNP,  in the last 8760 hours CBG:  Recent Labs Lab 03/17/14 1155  GLUCAP 276*    Radiological Exams on Admission: Dg Foot Complete Right  03/17/2014   CLINICAL DATA:  80 year old female with pain and swelling right great toe foot and lower extremity. Wound infection. Initial encounter.  EXAM: RIGHT FOOT COMPLETE - 3+ VIEW  COMPARISON:  None.  FINDINGS: Generalized soft tissue swelling. No subcutaneous gas. The tuft of the right great toe appears indistinct (arrows). There is overlying soft tissue swelling and irregularity. The right first distal phalanx otherwise appears intact. The right IP joint is within normal limits, as is the first MCP joint.  Other osseous structures in the right foot appear intact. There is prominent calcified atherosclerosis in the right foot. Mortise joint not well  evaluated on these images. Calcaneus appears intact.  IMPRESSION: 1. Indistinctness of the tuft of the right great toe suspicious for acute osteomyelitis. 2. Generalized soft tissue swelling. No subcutaneous gas. Calcified peripheral vascular disease. 3. No other acute osseous abnormality identified in the right foot.   Electronically Signed   By: Augusto GambleLee  Hall M.D.   On: 03/17/2014 13:47    EKG: none available.   Assessment/Plan Active Problems:   Cellulitis of right foot   1-Right LE cellulitis, great toe possible osteomyelitis. X ray with concern for great toe osteomyelitis. Will get MRI to better evaluate. Will continue with Vancomycin and Zosyn. Dr Eulah PontMurphy consulted. Blood culture ordered.   2-Diabetes: continue with lantus decrease dose. SSI.  3-Chronic pain ; Continue with Morphine and oxycodone PRN.   4-HTN; Continue with HCTZ.   5-Dysuria; will check UA.   6-Depression; continue with medications.     Code Status: DNR, discussed with Son Speltz kelly.  DVT Prophylaxis:Lovenox.  Family Communication: Care discussed with husband who was at bedside Disposition Plan: expect 3 to 4 days inpatient.   Time spent: 75 minutes.   Hartley Barefootegalado, [name] A [facility] Pager [phone]  **Disclaimer: This note may have been dictated with voice recognition software. Similar sounding words can inadvertently be transcribed and this note may contain transcription errors which may not have been corrected upon publication of note.**

## 2014-03-17 NOTE — ED Notes (Signed)
Patient here with right great toe, foot and leg swelling with redness increasing x 2 days. Patient pulled piece of skin from toe om Monday and now has the pain and swelling and warm to touch, started levaquin po on Thursday with worsening symptoms.

## 2014-03-17 NOTE — Progress Notes (Signed)
MD paged per orders.

## 2014-03-17 NOTE — Progress Notes (Signed)
Paged MD attending, she advised me to call the [facility] Admissions & Consults via amion.  Paged as ordered.

## 2014-03-17 NOTE — Consult Note (Signed)
ORTHOPAEDIC CONSULTATION  REQUESTING PHYSICIAN: Elmarie Shiley, MD  Chief Complaint: Right foot cellulitis  HPI: [name] is a 80 y.o. female who complains of  Right foot pain and swelling that started at a wound on her 1st toe. She has poorly controlled diabetes. Per her report this started 2 days ago but she also "forgets things" other notes indicate that this started 5days ago. It has not drained. She feels well over all.   Past Medical History  Diagnosis Date  . Incontinence of urine   . Diarrhea   . Neuropathy     bilateral feet  . GERD (gastroesophageal reflux disease)   . Depression   . Urinary, incontinence, stress female   . History of shingles 2009  . Degenerative lumbar spinal stenosis   . Type II diabetes mellitus     Type 2 IDDM  X 40 years;   . Arthritis     lower back/hands  . Altered mental status 01/13/2013  . Hypertension    Past Surgical History  Procedure Laterality Date  . Incontinence surgery  2005  . Tonsillectomy  1964  . Abdominal hysterectomy  1982  . Ovarian cyst surgery  1972  . Cholecystectomy  1988  . Cystoscopy  2009    macroplastique inj  . Rectocele repair  10/15/2011    Procedure: POSTERIOR REPAIR (RECTOCELE);  Surgeon: Ailene Rud, MD;  Location: [facility];  Service: Urology;;  posterior vaginal vault repair with sacral spinus repair with graft  . Cystoscopy  10/15/2011    Procedure: CYSTOSCOPY;  Surgeon: Ailene Rud, MD;  Location: [facility];  Service: Urology;  Laterality: N/A;  . Flexible sigmoidoscopy  02/28/2012    Procedure: FLEXIBLE SIGMOIDOSCOPY;  Surgeon: Arta Silence, MD;  Location: [facility];  Service: Endoscopy;  Laterality: N/A;  . Breast biopsy  1990    bilaterally  . Cataract extraction w/ intraocular lens implant    . Posterior fusion lumbar spine  05/09/2012   History   Social History  . Marital Status: Married    Spouse Name: N/A    Number of  Children: N/A  . Years of Education: N/A   Social History Main Topics  . Smoking status: Never Smoker   . Smokeless tobacco: Never Used  . Alcohol Use: No  . Drug Use: No  . Sexual Activity: No   Other Topics Concern  . None   Social History Narrative  . None   Family History  Problem Relation Age of Onset  . Osteoarthritis Sister   . Osteoarthritis Brother    Allergies  Allergen Reactions  . Ciprofloxacin Rash    Pt doesn't remember this allergy  . Neosporin [Neomycin-Bacitracin Zn-Polymyx] Rash    Pt doesn't remember this allergy  . Sulfa Antibiotics Rash   Prior to Admission medications   Medication Sig Start Date End Date Taking? Authorizing Provider  levofloxacin (LEVAQUIN) 500 MG tablet Take 500 mg by mouth daily.   Yes Historical Provider, MD  acetaminophen (TYLENOL) 325 MG tablet Take 650 mg by mouth every 6 (six) hours as needed. For pain    Historical Provider, MD  amiloride-hydrochlorothiazide (MODURETIC) 5-50 MG tablet Take 0.5 tablets by mouth daily.    Historical Provider, MD  aspirin EC 81 MG tablet Take 81 mg by mouth daily.    Historical Provider, MD  calcium citrate-vitamin D (CITRACAL+D) 315-200 MG-UNIT per tablet Take 2 tablets by mouth daily.    Historical  Provider, MD  cholecalciferol (VITAMIN D) 1000 UNITS tablet Take 1,000 Units by mouth daily.    Historical Provider, MD  DULoxetine (CYMBALTA) 30 MG capsule Take 30 mg by mouth daily.    Historical Provider, MD  fexofenadine (ALLEGRA) 180 MG tablet Take 180 mg by mouth daily.    Historical Provider, MD  GLUCERNA (GLUCERNA) LIQD Take 237 mLs by mouth 3 (three) times daily. Vanilla only    Historical Provider, MD  insulin aspart (NOVOLOG) 100 UNIT/ML injection Inject 6 Units into the skin 3 (three) times daily before meals.     Historical Provider, MD  insulin glargine (LANTUS) 100 UNIT/ML injection Inject 25 Units into the skin at bedtime.     Historical Provider, MD  iron polysaccharides (NIFEREX) 150  MG capsule Take 150 mg by mouth 2 (two) times daily.    Historical Provider, MD  LORazepam (ATIVAN) 0.5 MG tablet Take 1 tablet (0.5 mg total) by mouth every 12 (twelve) hours. For anxiety. 05/13/12   Hosie Spangle, MD  morphine (MS CONTIN) 15 MG 12 hr tablet Take 15 mg by mouth every 12 (twelve) hours.    Historical Provider, MD  olopatadine (PATANOL) 0.1 % ophthalmic solution Place 1 drop into both eyes 2 (two) times daily as needed (itching/watery eyes).     Historical Provider, MD  omeprazole (PRILOSEC) 20 MG capsule Take 20 mg by mouth daily.    Historical Provider, MD  ondansetron (ZOFRAN) 4 MG tablet Take 4 mg by mouth every 8 (eight) hours as needed for nausea or vomiting.    Historical Provider, MD  oxybutynin (DITROPAN-XL) 5 MG 24 hr tablet Take 5 mg by mouth daily.     Historical Provider, MD  oxycodone (OXY-IR) 5 MG capsule Take 5 mg by mouth every 4 (four) hours as needed for pain.    Historical Provider, MD  polyethylene glycol (MIRALAX / GLYCOLAX) packet Take 17 g by mouth daily.  03/03/12   Irven Shelling, MD  polyvinyl alcohol (LIQUIFILM TEARS) 1.4 % ophthalmic solution Place 1 drop into both eyes 3 (three) times daily.    Historical Provider, MD  senna-docusate (SENNA-S) 8.6-50 MG per tablet Take 2 tablets by mouth at bedtime.    Historical Provider, MD   Dg Foot Complete Right  03/17/2014   CLINICAL DATA:  80 year old female with pain and swelling right great toe foot and lower extremity. Wound infection. Initial encounter.  EXAM: RIGHT FOOT COMPLETE - 3+ VIEW  COMPARISON:  None.  FINDINGS: Generalized soft tissue swelling. No subcutaneous gas. The tuft of the right great toe appears indistinct (arrows). There is overlying soft tissue swelling and irregularity. The right first distal phalanx otherwise appears intact. The right IP joint is within normal limits, as is the first MCP joint.  Other osseous structures in the right foot appear intact. There is prominent calcified  atherosclerosis in the right foot. Mortise joint not well evaluated on these images. Calcaneus appears intact.  IMPRESSION: 1. Indistinctness of the tuft of the right great toe suspicious for acute osteomyelitis. 2. Generalized soft tissue swelling. No subcutaneous gas. Calcified peripheral vascular disease. 3. No other acute osseous abnormality identified in the right foot.   Electronically Signed   By: Lars Pinks M.D.   On: 03/17/2014 13:47    Positive ROS: All other systems have been reviewed and were otherwise negative with the exception of those mentioned in the HPI and as above.  Labs cbc  Recent Labs  03/17/14 1235  WBC  6.4  HGB 11.8*  HCT 35.9*  PLT 222    Labs inflam No results found for this basename: ESR, CRP,  in the last 72 hours  Labs coag No results found for this basename: INR, PT, PTT,  in the last 72 hours   Recent Labs  03/17/14 1235  NA 137  K 3.9  CL 96  CO2 33*  GLUCOSE 282*  BUN 23  CREATININE 0.70  CALCIUM 9.6    Physical Exam: Filed Vitals:   03/17/14 1658  BP: 140/53  Pulse: 60  Temp: 97.8 F (36.6 C)  Resp: 18   General: Alert, no acute distress Cardiovascular: No pedal edema Respiratory: No cyanosis, no use of accessory musculature GI: No organomegaly, abdomen is soft and non-tender Skin: No lesions in the area of chief complaint other than stated below.  Neurologic: Sensation intact distally Psychiatric: Patient is competent for consent with normal mood and affect Lymphatic: No axillary or cervical lymphadenopathy  MUSCULOSKELETAL:  RLE: She has mild swelling of the foot and ankle with erythema to the distal leg. 1st toe has a 1x0.5cm wound on the tip that is dry. I am unable to express fluid from this. Her sensation is decressed to both feet. 1+DP. +TA/GS/EHL Other extremities are atraumatic with painless ROM and NVI.  Assessment: Dry first toe wound and RLE cellulitis.   Plan: I will follow MRI and if worsened or fluid  collection, I will perform I&D. Otherwise we will observe for improvement on ABX and outpatient f/u for wound.   Weight Bearing Status: WBAT Wound care team.  PT VTE px: SCD's and Chemical per primary   Edmonia Lynch, D, MD Cell [phone]   03/17/2014 9:50 PM

## 2014-03-17 NOTE — Progress Notes (Signed)
Patient presents with right great toe infection, right LE cellulitis, ? Abscess. IV antibiotics. Dr Bebe ShaggyWickline kindly will consult ortho. I have accepted patient to med surge bed. Vitals stable.   Tonya BarefootBelkys Tonye Tancredi, MD.

## 2014-03-17 NOTE — Progress Notes (Signed)
ANTIBIOTIC CONSULT NOTE - INITIAL  Pharmacy Consult for Vancomycin, Zosyn  Indication: Osteomyelitis   Allergies  Allergen Reactions  . Ciprofloxacin Rash    Pt doesn't remember this allergy  . Neosporin [Neomycin-Bacitracin Zn-Polymyx] Rash    Pt doesn't remember this allergy  . Sulfa Antibiotics Rash    Patient Measurements: Height: 5\' 4"  (162.6 cm) Weight: 150 lb (68.04 kg) IBW/kg (Calculated) : 54.7 Adjusted Body Weight: n/a  Vital Signs: Temp: 97.8 F (36.6 C) (08/08 1658) Temp src: Oral (08/08 1658) BP: 140/53 mmHg (08/08 1658) Pulse Rate: 60 (08/08 1658) Intake/Output from previous day:   Intake/Output from this shift:    Labs:  Recent Labs  03/17/14 1235  WBC 6.4  HGB 11.8*  PLT 222  CREATININE 0.70   Estimated Creatinine Clearance: 54.9 ml/min (by C-G formula based on Cr of 0.7). No results found for this basename: VANCOTROUGH, VANCOPEAK, VANCORANDOM, GENTTROUGH, GENTPEAK, GENTRANDOM, TOBRATROUGH, TOBRAPEAK, TOBRARND, AMIKACINPEAK, AMIKACINTROU, AMIKACIN,  in the last 72 hours   Microbiology: No results found for this or any previous visit (from the past 720 hour(s)).  Medical History: Past Medical History  Diagnosis Date  . Incontinence of urine   . Diarrhea   . Neuropathy     bilateral feet  . GERD (gastroesophageal reflux disease)   . Depression   . Urinary, incontinence, stress female   . History of shingles 2009  . Degenerative lumbar spinal stenosis   . Type II diabetes mellitus     Type 2 IDDM  X 40 years;   . Arthritis     lower back/hands  . Altered mental status 01/13/2013  . Hypertension     Medications:  Prescriptions prior to admission  Medication Sig Dispense Refill  . levofloxacin (LEVAQUIN) 500 MG tablet Take 500 mg by mouth daily.      Marland Kitchen acetaminophen (TYLENOL) 325 MG tablet Take 650 mg by mouth every 6 (six) hours as needed. For pain      . amiloride-hydrochlorothiazide (MODURETIC) 5-50 MG tablet Take 0.5 tablets by  mouth daily.      Marland Kitchen aspirin EC 81 MG tablet Take 81 mg by mouth daily.      . calcium citrate-vitamin D (CITRACAL+D) 315-200 MG-UNIT per tablet Take 2 tablets by mouth daily.      . cholecalciferol (VITAMIN D) 1000 UNITS tablet Take 1,000 Units by mouth daily.      . DULoxetine (CYMBALTA) 30 MG capsule Take 30 mg by mouth daily.      . fexofenadine (ALLEGRA) 180 MG tablet Take 180 mg by mouth daily.      Marland Kitchen GLUCERNA (GLUCERNA) LIQD Take 237 mLs by mouth 3 (three) times daily. Vanilla only      . insulin aspart (NOVOLOG) 100 UNIT/ML injection Inject 6 Units into the skin 3 (three) times daily before meals.       . insulin glargine (LANTUS) 100 UNIT/ML injection Inject 25 Units into the skin at bedtime.       . iron polysaccharides (NIFEREX) 150 MG capsule Take 150 mg by mouth 2 (two) times daily.      Marland Kitchen LORazepam (ATIVAN) 0.5 MG tablet Take 1 tablet (0.5 mg total) by mouth every 12 (twelve) hours. For anxiety.  10 tablet  0  . morphine (MS CONTIN) 15 MG 12 hr tablet Take 15 mg by mouth every 12 (twelve) hours.      Marland Kitchen olopatadine (PATANOL) 0.1 % ophthalmic solution Place 1 drop into both eyes 2 (two) times daily as  needed (itching/watery eyes).       Marland Kitchen. omeprazole (PRILOSEC) 20 MG capsule Take 20 mg by mouth daily.      . ondansetron (ZOFRAN) 4 MG tablet Take 4 mg by mouth every 8 (eight) hours as needed for nausea or vomiting.      Marland Kitchen. oxybutynin (DITROPAN-XL) 5 MG 24 hr tablet Take 5 mg by mouth daily.       Marland Kitchen. oxycodone (OXY-IR) 5 MG capsule Take 5 mg by mouth every 4 (four) hours as needed for pain.      . polyethylene glycol (MIRALAX / GLYCOLAX) packet Take 17 g by mouth daily.       . polyvinyl alcohol (LIQUIFILM TEARS) 1.4 % ophthalmic solution Place 1 drop into both eyes 3 (three) times daily.      Marland Kitchen. senna-docusate (SENNA-S) 8.6-50 MG per tablet Take 2 tablets by mouth at bedtime.       Assessment: [name] who presented with Right toe cellulitis. A foot X-ray is suspicious for osteomyelitis.  Pharmacy consulted to start empiric antibiotics. Ortho has been consulted. Vital signs are stable. WBC wnl. Patient already received a dose of Vancomycin and Zosyn in the ED.   Cultures: 8/8 Blood Cx x2>>   Goal of Therapy:  Vancomycin trough level 15-20 mcg/ml  Plan:  -Start Vancomycin 500 mg IV Q 12 hours -Start Zosyn 3.375 gm IV Q 8 hours -Monitor CBC, renal fx, cultures and patient's clinical progress  [facility] -F/u MRI to confirm osteo    Vinnie LevelBenjamin Glenford Garis, PharmD.  Clinical Pharmacist Pager [phone]

## 2014-03-17 NOTE — ED Provider Notes (Signed)
CSN: [id number]     Arrival date & time 03/17/14  1125 History   First MD Initiated Contact with Patient 03/17/14 1218     Chief Complaint  Patient presents with  . Wound Infection    Patient/family gave verbal permission to utilize photo for medical documentation only The image was not stored on any personal device   Patient is a 80 y.o. female presenting with lower extremity pain. The history is provided by the patient.  Foot Pain This is a new problem. The current episode started more than 2 days ago. The problem occurs daily. The problem has been gradually worsening. Pertinent negatives include no chest pain. Exacerbated by: movement. The symptoms are relieved by rest. Treatments tried: levaquin. The treatment provided no relief.  pt pulled a piece of skin off of right great toe several days ago and now has redness/swelling to toe/foot and leg.  No trauma reported  She had been seen by PCP and started on levaquin but symptoms are worsening  She reports mild nausea and dysuria but no other complaints  Past Medical History  Diagnosis Date  . Incontinence of urine   . Diarrhea   . Neuropathy     bilateral feet  . GERD (gastroesophageal reflux disease)   . Depression   . Urinary, incontinence, stress female   . History of shingles 2009  . Degenerative lumbar spinal stenosis   . Type II diabetes mellitus     Type 2 IDDM  X 40 years;   . Arthritis     lower back/hands  . Altered mental status 01/13/2013   Past Surgical History  Procedure Laterality Date  . Incontinence surgery  2005  . Tonsillectomy  1964  . Abdominal hysterectomy  1982  . Ovarian cyst surgery  1972  . Cholecystectomy  1988  . Cystoscopy  2009    macroplastique inj  . Rectocele repair  10/15/2011    Procedure: POSTERIOR REPAIR (RECTOCELE);  Surgeon: Kathi LudwigSigmund I Tannenbaum, MD;  [facility];  Service: Urology;;  posterior vaginal vault repair with sacral spinus repair with graft  .  Cystoscopy  10/15/2011    Procedure: CYSTOSCOPY;  Surgeon: Kathi LudwigSigmund I Tannenbaum, MD;  [facility];  Service: Urology;  Laterality: N/A;  . Flexible sigmoidoscopy  02/28/2012    Procedure: FLEXIBLE SIGMOIDOSCOPY;  Surgeon: Willis ModenaWilliam Outlaw, MD;  Location: [facility];  Service: Endoscopy;  Laterality: N/A;  . Breast biopsy  1990    bilaterally  . Cataract extraction w/ intraocular lens implant    . Posterior fusion lumbar spine  05/09/2012   Family History  Problem Relation Age of Onset  . Osteoarthritis Sister   . Osteoarthritis Brother    History  Substance Use Topics  . Smoking status: Never Smoker   . Smokeless tobacco: Never Used  . Alcohol Use: No   OB History   Grav Para Term Preterm Abortions TAB SAB Ect Mult Living                 Review of Systems  Constitutional: Negative for fever and chills.  Cardiovascular: Negative for chest pain.  Gastrointestinal: Positive for nausea.  Genitourinary: Positive for dysuria.  Musculoskeletal: Negative for back pain.  Skin: Positive for wound.  Neurological: Negative for weakness.  All other systems reviewed and are negative.     Allergies  Ciprofloxacin; Neosporin; and Sulfa antibiotics  Home Medications   Prior to Admission medications   Medication Sig Start Date End Date  Taking? Authorizing Provider  levofloxacin (LEVAQUIN) 500 MG tablet Take 500 mg by mouth daily.   Yes Historical Provider, MD  acetaminophen (TYLENOL) 325 MG tablet Take 650 mg by mouth every 6 (six) hours as needed. For pain    Historical Provider, MD  amiloride-hydrochlorothiazide (MODURETIC) 5-50 MG tablet Take 0.5 tablets by mouth daily.    Historical Provider, MD  aspirin EC 81 MG tablet Take 81 mg by mouth daily.    Historical Provider, MD  calcium citrate-vitamin D (CITRACAL+D) 315-200 MG-UNIT per tablet Take 2 tablets by mouth daily.    Historical Provider, MD  cholecalciferol (VITAMIN D) 1000 UNITS tablet Take 1,000 Units  by mouth daily.    Historical Provider, MD  DULoxetine (CYMBALTA) 30 MG capsule Take 30 mg by mouth daily.    Historical Provider, MD  fexofenadine (ALLEGRA) 180 MG tablet Take 180 mg by mouth daily.    Historical Provider, MD  GLUCERNA (GLUCERNA) LIQD Take 237 mLs by mouth 3 (three) times daily. Vanilla only    Historical Provider, MD  insulin aspart (NOVOLOG) 100 UNIT/ML injection Inject 6 Units into the skin 3 (three) times daily before meals.     Historical Provider, MD  insulin glargine (LANTUS) 100 UNIT/ML injection Inject 25 Units into the skin at bedtime.     Historical Provider, MD  iron polysaccharides (NIFEREX) 150 MG capsule Take 150 mg by mouth 2 (two) times daily.    Historical Provider, MD  LORazepam (ATIVAN) 0.5 MG tablet Take 1 tablet (0.5 mg total) by mouth every 12 (twelve) hours. For anxiety. 05/13/12   Hewitt Shorts, MD  morphine (MS CONTIN) 15 MG 12 hr tablet Take 15 mg by mouth every 12 (twelve) hours.    Historical Provider, MD  olopatadine (PATANOL) 0.1 % ophthalmic solution Place 1 drop into both eyes 2 (two) times daily as needed (itching/watery eyes).     Historical Provider, MD  omeprazole (PRILOSEC) 20 MG capsule Take 20 mg by mouth daily.    Historical Provider, MD  ondansetron (ZOFRAN) 4 MG tablet Take 4 mg by mouth every 8 (eight) hours as needed for nausea or vomiting.    Historical Provider, MD  oxybutynin (DITROPAN-XL) 5 MG 24 hr tablet Take 5 mg by mouth daily.     Historical Provider, MD  oxycodone (OXY-IR) 5 MG capsule Take 5 mg by mouth every 4 (four) hours as needed for pain.    Historical Provider, MD  polyethylene glycol (MIRALAX / GLYCOLAX) packet Take 17 g by mouth daily.  03/03/12   Lillia Mountain, MD  polyvinyl alcohol (LIQUIFILM TEARS) 1.4 % ophthalmic solution Place 1 drop into both eyes 3 (three) times daily.    Historical Provider, MD  senna-docusate (SENNA-S) 8.6-50 MG per tablet Take 2 tablets by mouth at bedtime.    Historical Provider, MD     BP 127/55  Pulse 75  Temp(Src) 98 F (36.7 C) (Oral)  Resp 16  Ht 5\' 4"  (1.626 m)  Wt 150 lb (68.04 kg)  BMI 25.73 kg/m2  SpO2 98% Physical Exam CONSTITUTIONAL: elderly, frail HEAD: Normocephalic/atraumatic EYES: EOMI ENMT: Mucous membranes moist NECK: supple no meningeal signs CV: S1/S2 noted, no murmurs/rubs/gallops noted LUNGS: Lungs are clear to auscultation bilaterally, no apparent distress ABDOMEN: soft, nontender, no rebound or guarding NEURO: Pt is awake/alert, moves all extremitiesx4 EXTREMITIES: pulses normal, full ROM. See photo below.  No crepitus is noted to foot/toe on palpation SKIN:  see below PSYCH: flat affect  ED Course  Procedures   1:50 PM Pt with cellulitis/abscess to right great toe with erythema/edema tracking up leg Xray suspicious for acute osteomyelitis Will need admission 2:43 PM Discussed findings with dr Sunnie Nielsen with [facility] Will accept in [facility] to [facility] [place] [facility]link has made ortho dr Eulah Pont aware of patient (possible osteomyelitis per Xray) Pt stable otherwise for transport  Labs Review Labs Reviewed  BASIC METABOLIC PANEL - Abnormal; Notable for the following:    CO2 33 (*)    Glucose, Bld 282 (*)    GFR calc non Af Amer 81 (*)    All other components within normal limits  CBC WITH DIFFERENTIAL - Abnormal; Notable for the following:    Hemoglobin 11.8 (*)    HCT 35.9 (*)    All other components within normal limits  CBG MONITORING, ED - Abnormal; Notable for the following:    Glucose-Capillary 276 (*)    All other components within normal limits  CULTURE, BLOOD (ROUTINE X 2)  CULTURE, BLOOD (ROUTINE X 2)    Imaging Review Dg Foot Complete Right  03/17/2014   CLINICAL DATA:  80 year old female with pain and swelling right great toe foot and lower extremity. Wound infection. Initial encounter.  EXAM: RIGHT FOOT COMPLETE - 3+ VIEW  COMPARISON:  None.  FINDINGS: Generalized soft tissue swelling. No subcutaneous  gas. The tuft of the right great toe appears indistinct (arrows). There is overlying soft tissue swelling and irregularity. The right first distal phalanx otherwise appears intact. The right IP joint is within normal limits, as is the first MCP joint.  Other osseous structures in the right foot appear intact. There is prominent calcified atherosclerosis in the right foot. Mortise joint not well evaluated on these images. Calcaneus appears intact.  IMPRESSION: 1. Indistinctness of the tuft of the right great toe suspicious for acute osteomyelitis. 2. Generalized soft tissue swelling. No subcutaneous gas. Calcified peripheral vascular disease. 3. No other acute osseous abnormality identified in the right foot.   Electronically Signed   By: Augusto Gamble M.D.   On: 03/17/2014 13:47      MDM   Final diagnoses:  Cellulitis of right foot  Cellulitis of great toe, right  Hyperglycemia    Nursing notes including past medical history and social history reviewed and considered in documentation xrays reviewed and considered Labs/vital reviewed and considered     Joya Gaskins, MD 03/17/14 [phone]

## 2014-03-18 ENCOUNTER — Inpatient Hospital Stay (HOSPITAL_COMMUNITY): Payer: Medicare Other

## 2014-03-18 DIAGNOSIS — L03119 Cellulitis of unspecified part of limb: Principal | ICD-10-CM

## 2014-03-18 DIAGNOSIS — L02619 Cutaneous abscess of unspecified foot: Principal | ICD-10-CM

## 2014-03-18 LAB — URINALYSIS, ROUTINE W REFLEX MICROSCOPIC
Bilirubin Urine: NEGATIVE
Glucose, UA: NEGATIVE mg/dL
HGB URINE DIPSTICK: NEGATIVE
Ketones, ur: NEGATIVE mg/dL
Leukocytes, UA: NEGATIVE
NITRITE: NEGATIVE
Protein, ur: NEGATIVE mg/dL
Specific Gravity, Urine: 1.013 (ref 1.005–1.030)
UROBILINOGEN UA: 0.2 mg/dL (ref 0.0–1.0)
pH: 7 (ref 5.0–8.0)

## 2014-03-18 LAB — HEMOGLOBIN A1C
HEMOGLOBIN A1C: 6.4 % — AB (ref <5.7)
MEAN PLASMA GLUCOSE: 137 mg/dL — AB (ref <117)

## 2014-03-18 LAB — GLUCOSE, CAPILLARY
GLUCOSE-CAPILLARY: 313 mg/dL — AB (ref 70–99)
Glucose-Capillary: 80 mg/dL (ref 70–99)
Glucose-Capillary: 235 mg/dL — ABNORMAL HIGH (ref 70–99)
Glucose-Capillary: 156 mg/dL — ABNORMAL HIGH (ref 70–99)

## 2014-03-18 MED ORDER — INSULIN GLARGINE 100 UNIT/ML ~~LOC~~ SOLN
20.0000 [IU] | Freq: Every day | SUBCUTANEOUS | Status: DC
  Filled 2014-03-18: qty 0.2

## 2014-03-18 MED ORDER — GADOBENATE DIMEGLUMINE 529 MG/ML IV SOLN
15.0000 mL | Freq: Once | INTRAVENOUS | Status: AC | PRN
  Administered 2014-03-18: 15 mL via INTRAVENOUS

## 2014-03-18 MED ORDER — INSULIN GLARGINE 100 UNIT/ML ~~LOC~~ SOLN
10.0000 [IU] | Freq: Once | SUBCUTANEOUS | Status: DC
  Filled 2014-03-18: qty 0.1

## 2014-03-18 MED ORDER — INSULIN GLARGINE 100 UNIT/ML ~~LOC~~ SOLN
5.0000 [IU] | Freq: Once | SUBCUTANEOUS | Status: AC
  Administered 2014-03-18: 5 [IU] via SUBCUTANEOUS
  Filled 2014-03-18: qty 0.05

## 2014-03-18 MED ORDER — INSULIN GLARGINE 100 UNIT/ML ~~LOC~~ SOLN
15.0000 [IU] | Freq: Every day | SUBCUTANEOUS | Status: DC
  Administered 2014-03-18 – 2014-03-19 (×2): 15 [IU] via SUBCUTANEOUS
  Filled 2014-03-18 (×3): qty 0.15

## 2014-03-18 NOTE — Progress Notes (Addendum)
[facility] PROGRESS NOTE  [name] ZOX:[id number] DOB: 12/12/1932 DOA: 03/17/2014 PCP: Lillia Mountain, MD  Assessment/Plan: Active Problems:   Cellulitis of right foot   Cellulitis of right leg    1-Right LE cellulitis, great toe possible osteomyelitis. X ray with concern for great toe osteomyelitis. MRI pending. Will continue with Vancomycin and Zosyn. Dr Eulah Pont consulted, appreciate his input. Blood culture ordered.  2-Diabetes: Increase dose of Lantus to 15 units, given extra 5 units mouth. SSI. Obtain hemoglobin A1c 3-Chronic pain ; Continue with Morphine and oxycodone PRN.  4-HTN; Continue with HCTZ.  5-Dysuria; will check UA.  6-Depression; continue with medications.    Code Status: DO NOT RESUSCITATE Family Communication: family updated about patient's clinical progress Disposition Plan:  As above    Brief narrative: [name] is a 80 y.o. female with PMH significant for Diabetes, Hypertension, on Chronic pain medications, who presents to [facility] with Right foot, great toe pain, swelling, redness, that started 5 days prior to admission. Patient relates that the skin of her great toe was pilling, so she started to removed it. No history of fever, no nausea, no vomiting. Denies chest pain or dyspnea. She does relates dysuria.    Consultants: Sheral Apley, MD   Procedures:  None  Antibiotics:  Zosyn and vancomycin*  HPI/Subjective: Slow to respond denies any pain  Objective: Filed Vitals:   03/17/14 1519 03/17/14 1658 03/17/14 2131 03/18/14 0657  BP: 122/56 140/53 173/68 132/53  Pulse: 68 60 89 70  Temp:  97.8 F (36.6 C) 97.7 F (36.5 C) 98.1 F (36.7 C)  TempSrc:  Oral Oral Oral  Resp:  18 18 16   Height:      Weight:      SpO2: 99% 100% 100% 100%    Intake/Output Summary (Last 24 hours) at 03/18/14 1019 Last data filed at 03/17/14 2250  Gross per 24 hour  Intake    240 ml  Output      0 ml  Net    240 ml     Exam:  General: alert & oriented x 3 In NAD  Cardiovascular: RRR, nl S1 s2  Respiratory: Decreased breath sounds at the bases, scattered rhonchi, no crackles  Abdomen: soft +BS NT/ND, no masses palpable  Extremities: RLE: She has mild swelling of the foot and ankle with erythema to the distal leg. 1st toe has a 1x0.5cm wound on the tip that is dry. I am unable to express fluid from this. Her sensation is decressed to both feet. 1+DP. +TA/GS/EHL  Other extremities are atraumatic with painless ROM and NVI.       Data Reviewed: Basic Metabolic Panel:  Recent Labs Lab 03/17/14 1235  NA 137  K 3.9  CL 96  CO2 33*  GLUCOSE 282*  BUN 23  CREATININE 0.70  CALCIUM 9.6    Liver Function Tests: No results found for this basename: AST, ALT, ALKPHOS, BILITOT, PROT, ALBUMIN,  in the last 168 hours No results found for this basename: LIPASE, AMYLASE,  in the last 168 hours No results found for this basename: AMMONIA,  in the last 168 hours  CBC:  Recent Labs Lab 03/17/14 1235  WBC 6.4  NEUTROABS 3.9  HGB 11.8*  HCT 35.9*  MCV 88.6  PLT 222    Cardiac Enzymes: No results found for this basename: CKTOTAL, CKMB, CKMBINDEX, TROPONINI,  in the last 168 hours BNP (last 3 results) No results found for this basename: PROBNP,  in the last 8760 hours   CBG:  Recent Labs Lab 03/17/14 1155 03/17/14 1853 03/17/14 2200 03/18/14 0700  GLUCAP 276* 96 250* 80    No results found for this or any previous visit (from the past 240 hour(s)).   Studies: Dg Foot Complete Right  03/17/2014   CLINICAL DATA:  80 year old female with pain and swelling right great toe foot and lower extremity. Wound infection. Initial encounter.  EXAM: RIGHT FOOT COMPLETE - 3+ VIEW  COMPARISON:  None.  FINDINGS: Generalized soft tissue swelling. No subcutaneous gas. The tuft of the right great toe appears indistinct (arrows). There is overlying soft tissue swelling and irregularity. The right first  distal phalanx otherwise appears intact. The right IP joint is within normal limits, as is the first MCP joint.  Other osseous structures in the right foot appear intact. There is prominent calcified atherosclerosis in the right foot. Mortise joint not well evaluated on these images. Calcaneus appears intact.  IMPRESSION: 1. Indistinctness of the tuft of the right great toe suspicious for acute osteomyelitis. 2. Generalized soft tissue swelling. No subcutaneous gas. Calcified peripheral vascular disease. 3. No other acute osseous abnormality identified in the right foot.   Electronically Signed   By: Augusto GambleLee  Hall M.D.   On: 03/17/2014 13:47    Scheduled Meds: . aspirin EC  81 mg Oral Daily  . DULoxetine  30 mg Oral Daily  . enoxaparin (LOVENOX) injection  40 mg Subcutaneous Q24H  . feeding supplement (GLUCERNA SHAKE)  237 mL Oral TID  . hydrochlorothiazide  25 mg Oral Daily  . insulin aspart  0-9 Units Subcutaneous TID WC  . insulin glargine  10 Units Subcutaneous QHS  . iron polysaccharides  150 mg Oral BID WC  . LORazepam  0.5 mg Oral Q12H  . morphine  15 mg Oral Q12H  . oxybutynin  5 mg Oral Daily  . pantoprazole  40 mg Oral Daily  . piperacillin-tazobactam (ZOSYN)  IV  3.375 g Intravenous 3 times per day  . polyethylene glycol  17 g Oral Daily  . polyvinyl alcohol  1 drop Both Eyes TID  . senna-docusate  2 tablet Oral QHS  . vancomycin  500 mg Intravenous Q12H   Continuous Infusions:   Active Problems:   Cellulitis of right foot   Cellulitis of right leg    Time spent: 40 minutes   [facility],Vaunda Gutterman  [facility] Pager [phone]. If 8PM-8AM, please contact night-coverage at [email or web address], password [email or web address] 03/18/2014, 10:19 AM  LOS: 1 day

## 2014-03-19 LAB — GLUCOSE, CAPILLARY
Glucose-Capillary: 108 mg/dL — ABNORMAL HIGH (ref 70–99)
Glucose-Capillary: 219 mg/dL — ABNORMAL HIGH (ref 70–99)
Glucose-Capillary: 316 mg/dL — ABNORMAL HIGH (ref 70–99)
Glucose-Capillary: 172 mg/dL — ABNORMAL HIGH (ref 70–99)

## 2014-03-19 NOTE — Progress Notes (Addendum)
Physical Therapy Evaluation Patient Details Name: [name]: [id number] DOB: [birth year] Today's Date: 03/19/2014    History of Present Illness 80 y.o. female s/p Right LE cellulitis, great toe possible osteomyelitis. Hx of HTN, and diabetes.    PT Comments    Pt admitted with above. She is currently with functional limitations due to the deficits listed below (see PT Problem List). Ambulating generally well up to 75 feet with supervision. Reviewed therapeutic exercises and importance of daily mobility and being out of bed. Pt will benefit from skilled PT to increase their independence and safety with mobility to allow discharge to the venue listed below. Believe she is functionally able to return to assisted living facility with HHPT to continue working to improve functional independence.   Follow Up Recommendations  Home health PT;Other (comment) (at assisted living facility)     Equipment Recommendations  None recommended by PT    Recommendations for Other Services OT consult     Precautions / Restrictions Precautions Precautions: Fall Restrictions Weight Bearing Restrictions: Yes RLE Weight Bearing: Weight bearing as tolerated    Mobility  Bed Mobility Overal bed mobility: Needs Assistance Bed Mobility: Supine to Sit     Supine to sit: Min guard     General bed mobility comments: Min guard for safety with HOB flat. Moderate use of bedside rail. VC for technique and requires extra time  Transfers Overall transfer level: Needs assistance Equipment used: Rolling walker (2 wheeled) Transfers: Sit to/from Stand Sit to Stand: Min guard         General transfer comment: Min guard for safety with VC fo hand placement. Performed from lowest bed setting without physical assist required.  Ambulation/Gait Ambulation/Gait assistance: Supervision Ambulation Distance (Feet): 75 Feet Assistive device: Rolling walker (2 wheeled) Gait Pattern/deviations: Step-through  pattern;Decreased step length - left;Decreased stride length;Decreased dorsiflexion - left;Shuffle;Trunk flexed   Gait velocity interpretation: Below normal speed for age/gender General Gait Details: Educated on safe DME use. No loss of balance during ambulatory bout. VC for walker placement and frequent cues for upright posture.  Navigates turns well.   Stairs            Wheelchair Mobility    Modified Rankin (Stroke Patients Only)       Balance Overall balance assessment: Needs assistance Sitting-balance support: No upper extremity supported;Feet supported Sitting balance-Leahy Scale: Fair     Standing balance support: No upper extremity supported Standing balance-Leahy Scale: Fair                      Cognition Arousal/Alertness: Awake/alert Behavior During Therapy: WFL for tasks assessed/performed Overall Cognitive Status: Within Functional Limits for tasks assessed                      Exercises General Exercises - Lower Extremity Ankle Circles/Pumps: AROM;Both;10 reps;Seated Quad Sets: Strengthening;Both;10 reps;Supine Long Arc Quad: Strengthening;Right;10 reps;Seated    General Comments General comments (skin integrity, edema, etc.): Reviewed importance of daily exercise and frequent mobility.      Pertinent Vitals/Pain Pain Assessment: 0-10 Pain Score: 5  Pain Location: back Pain Descriptors / Indicators:  (unable to say other than "hurts") Pain Intervention(s): Limited activity within patient's tolerance;Monitored during session;Repositioned    Home Living Family/patient expects to be discharged to:: Assisted living ("[facility]")   Available Help at Discharge: Other (Comment) (assisted living) Type of Home: Assisted living Home Access: Level entry   Home Layout: One level  Home Equipment: Walker - 2 wheels;Grab bars - toilet;Grab bars - tub/shower;Shower seat      Prior Function Level of Independence: Needs  assistance  Gait / Transfers Assistance Needed: Mod I with a rolling walker ADL's / Homemaking Assistance Needed: Needed assistance occasionally for bath/dress.     PT Goals (current goals can now be found in the care plan section) Acute Rehab PT Goals Patient Stated Goal: None stated PT Goal Formulation: With patient/family Time For Goal Achievement: 03/26/14 Potential to Achieve Goals: Good    Frequency  Min 5X/week    PT Plan      Co-evaluation             End of Session   Activity Tolerance: Patient tolerated treatment well Patient left: in chair;with call bell/phone within reach;with family/visitor present     Time: [id number] PT Time Calculation (min): 34 min  Charges:  $Gait Training: 8-22 mins $Therapeutic Activity: 8-22 mins                    G Codes:      Charlsie MerlesLogan Secor Yosselin Zoeller, [place] CarolinaPT [phone]  Berton MountBarbour, Kaleisha Bhargava S 03/19/2014, 4:47 PM   Addendum for title to "Evaluation" Treatment template entered in error upon initial evaluation

## 2014-03-19 NOTE — Progress Notes (Signed)
Inpatient Diabetes Program Recommendations  AACE/ADA: New Consensus Statement on Inpatient Glycemic Control (2013)  Target Ranges:  Prepandial:   less than 140 mg/dL      Peak postprandial:   less than 180 mg/dL (1-2 hours)      Critically ill patients:  140 - 180 mg/dL   Reason for Assessment:  Sub-optimal CBG's   Diabetes history: Insulin- requiring Outpatient Diabetes medications: Lantus 15 units at HS, Novolog 5 units tid with meals Current orders for Inpatient glycemic control: Lantus 15 units, Sensitive Correction tid with meals   Results for Len ChildsBRAME, Marthena L (MRN [id number]) as of 03/19/2014 12:49  Ref. Range 03/18/2014 07:00 03/18/2014 11:06 03/18/2014 16:54 03/18/2014 21:46 03/19/2014 06:44 03/19/2014 11:04  Glucose-Capillary Latest Range: 70-99 mg/dL 80 147235 (H) 829313 (H) 562156 (H) 108 (H) 219 (H)   Note:  CBG pattern indicates that Novolog meal coverage would be advantageous in addition to correction scale.  Please consider ordering Novolog 4 units tid with meals (to be held if NPO or eats < 50% of meal).  Usually takes 5 units meal coverage at home.  Thank you.  Islah Eve S. Elsie Lincolnouth, RN, CNS, CDE Inpatient Diabetes Program, team pager [phone]

## 2014-03-19 NOTE — Progress Notes (Signed)
[facility] PROGRESS NOTE  [name] ZOX:[id number] DOB: 06/23/1933 DOA: 03/17/2014 PCP: Lillia Mountain, MD  Assessment/Plan: Active Problems:   Cellulitis of right foot   Cellulitis of right leg   1-Right LE cellulitis, great toe possible osteomyelitis. X ray with concern for great toe osteomyelitis. MRI showed osteomyelitis of the distal phalanx of the great toe, septic arthritis of the interphalangeal joint, marked cellulitis of the great toe.. Will continue with Vancomycin and Zosyn. Dr Eulah Pont consulted, as per orthopedics, no indication for surgery at this time, blood culture no growth so far  2-Diabetes: Lantus increased to 15 units yesterday. SSI. Hemoglobin A1c 6.4 3-Chronic pain ; Continue with Morphine and oxycodone PRN.  4-HTN; Continue with HCTZ.  5-Dysuria; will check UA.  6-Depression; patient currently on Cymbalta,    Code Status: DO NOT RESUSCITATE Family Communication: family updated about patient's clinical progress  Disposition Plan: Likely discharge home tomorrow   Brief narrative:  [name] is a 80 y.o. female with PMH significant for Diabetes, Hypertension, on Chronic pain medications, who presents to [facility] with Right foot, great toe pain, swelling, redness, that started 5 days prior to admission. Patient relates that the skin of her great toe was pilling, so she started to removed it. No history of fever, no nausea, no vomiting. Denies chest pain or dyspnea. She does relates dysuria.  Consultants:  Sheral Apley, MD  Procedures:  None Antibiotics:  Zosyn and vancomycin*     HPI/Subjective: More awake and alert Denies any pain  Objective: Filed Vitals:   03/17/14 2131 03/18/14 0657 03/18/14 2150 03/19/14 0645  BP: 173/68 132/53 128/46 132/56  Pulse: 89 70 73 70  Temp: 97.7 F (36.5 C) 98.1 F (36.7 C) 98.3 F (36.8 C) 98.8 F (37.1 C)  TempSrc: Oral Oral Oral Oral  Resp: 18 16 18 16   Height:      Weight:       SpO2: 100% 100% 99% 98%    Intake/Output Summary (Last 24 hours) at 03/19/14 1000 Last data filed at 03/19/14 0900  Gross per 24 hour  Intake    630 ml  Output      0 ml  Net    630 ml    Exam:  General: alert & oriented x 3 In NAD  Cardiovascular: RRR, nl S1 s2  Respiratory: Decreased breath sounds at the bases, scattered rhonchi, no crackles  Abdomen: soft +BS NT/ND, no masses palpable  Extremities: No cyanosis and no edema      Data Reviewed: Basic Metabolic Panel:  Recent Labs Lab 03/17/14 1235  NA 137  K 3.9  CL 96  CO2 33*  GLUCOSE 282*  BUN 23  CREATININE 0.70  CALCIUM 9.6    Liver Function Tests: No results found for this basename: AST, ALT, ALKPHOS, BILITOT, PROT, ALBUMIN,  in the last 168 hours No results found for this basename: LIPASE, AMYLASE,  in the last 168 hours No results found for this basename: AMMONIA,  in the last 168 hours  CBC:  Recent Labs Lab 03/17/14 1235  WBC 6.4  NEUTROABS 3.9  HGB 11.8*  HCT 35.9*  MCV 88.6  PLT 222    Cardiac Enzymes: No results found for this basename: CKTOTAL, CKMB, CKMBINDEX, TROPONINI,  in the last 168 hours BNP (last 3 results) No results found for this basename: PROBNP,  in the last 8760 hours   CBG:  Recent Labs Lab 03/18/14 0700 03/18/14 1106 03/18/14 1654 03/18/14  2146 03/19/14 0644  GLUCAP 80 235* 313* 156* 108*    Recent Results (from the past 240 hour(s))  CULTURE, BLOOD (ROUTINE X 2)     Status: None   Collection Time    03/17/14 12:35 PM      Result Value Ref Range Status   Specimen Description BLOOD RIGHT ARM   Final   Special Requests BOTTLES DRAWN AEROBIC AND ANAEROBIC 5CC EACH   Final   Culture  Setup Time     Final   Value: 03/17/2014 20:30     Performed at [facility]   Culture     Final   Value:        BLOOD CULTURE RECEIVED NO GROWTH TO DATE CULTURE WILL BE HELD FOR 5 DAYS BEFORE ISSUING A FINAL NEGATIVE REPORT     Performed at [facility]   Report Status PENDING   Incomplete  CULTURE, BLOOD (ROUTINE X 2)     Status: None   Collection Time    03/17/14 12:40 PM      Result Value Ref Range Status   Specimen Description BLOOD LEFT ARM   Final   Special Requests BOTTLES DRAWN AEROBIC AND ANAEROBIC 10CC   Final   Culture  Setup Time     Final   Value: 03/17/2014 20:30     Performed at [facility]   Culture     Final   Value:        BLOOD CULTURE RECEIVED NO GROWTH TO DATE CULTURE WILL BE HELD FOR 5 DAYS BEFORE ISSUING A FINAL NEGATIVE REPORT     Performed at [facility]   Report Status PENDING   Incomplete     Studies: Mr Foot Right W Wo Contrast  03/18/2014   CLINICAL DATA:  Pain, swelling and open wound on the great toe.  EXAM: MRI OF THE RIGHT FOREFOOT WITHOUT AND WITH CONTRAST  TECHNIQUE: Multiplanar, multisequence MR imaging was performed both before and after administration of intravenous contrast.  CONTRAST:  15mL MULTIHANCE GADOBENATE DIMEGLUMINE 529 MG/ML IV SOLN  COMPARISON:  Radiographs 03/17/2014  FINDINGS: There is but open wound at the tip of the great toe with surrounding soft tissue swelling/ edema and enhancement all consistent with cellulitis. I do not see a discrete drainable soft tissue abscess. There is osteomyelitis involving the distal phalanx with diffuse signal abnormality and enhancement. No obvious changes of septic arthritis involving the interphalangeal joint. The remaining bony structures are unremarkable. There is moderate diffuse cellulitis involving the forefoot. There is also myositis but no findings for pyomyositis.  IMPRESSION: Marked cellulitis involving the great toe. There is also diffuse cellulitis and myositis involving the forefoot.  Osteomyelitis involving the distal phalanx of the great toe. No findings for septic arthritis at the interphalangeal joint.   Electronically Signed   By: Loralie ChampagneMark  Gallerani M.D.   On: 03/18/2014 15:28   Dg Foot Complete Right  03/17/2014    CLINICAL DATA:  80 year old female with pain and swelling right great toe foot and lower extremity. Wound infection. Initial encounter.  EXAM: RIGHT FOOT COMPLETE - 3+ VIEW  COMPARISON:  None.  FINDINGS: Generalized soft tissue swelling. No subcutaneous gas. The tuft of the right great toe appears indistinct (arrows). There is overlying soft tissue swelling and irregularity. The right first distal phalanx otherwise appears intact. The right IP joint is within normal limits, as is the first MCP joint.  Other osseous structures in the right foot appear intact. There  is prominent calcified atherosclerosis in the right foot. Mortise joint not well evaluated on these images. Calcaneus appears intact.  IMPRESSION: 1. Indistinctness of the tuft of the right great toe suspicious for acute osteomyelitis. 2. Generalized soft tissue swelling. No subcutaneous gas. Calcified peripheral vascular disease. 3. No other acute osseous abnormality identified in the right foot.   Electronically Signed   By: Augusto Gamble M.D.   On: 03/17/2014 13:47    Scheduled Meds: . aspirin EC  81 mg Oral Daily  . DULoxetine  30 mg Oral Daily  . enoxaparin (LOVENOX) injection  40 mg Subcutaneous Q24H  . feeding supplement (GLUCERNA SHAKE)  237 mL Oral TID  . hydrochlorothiazide  25 mg Oral Daily  . insulin aspart  0-9 Units Subcutaneous TID WC  . insulin glargine  15 Units Subcutaneous QHS  . iron polysaccharides  150 mg Oral BID WC  . LORazepam  0.5 mg Oral Q12H  . morphine  15 mg Oral Q12H  . oxybutynin  5 mg Oral Daily  . pantoprazole  40 mg Oral Daily  . piperacillin-tazobactam (ZOSYN)  IV  3.375 g Intravenous 3 times per day  . polyethylene glycol  17 g Oral Daily  . polyvinyl alcohol  1 drop Both Eyes TID  . senna-docusate  2 tablet Oral QHS  . vancomycin  500 mg Intravenous Q12H   Continuous Infusions:   Active Problems:   Cellulitis of right foot   Cellulitis of right leg    Time spent: 40  minutes   [facility]  [facility] Pager [phone]. If 8PM-8AM, please contact night-coverage at [email or web address], password [email or web address] 03/19/2014, 10:00 AM  LOS: 2 days

## 2014-03-19 NOTE — Progress Notes (Signed)
    Subjective:  Patient reports pain as improving pain and erythema  Objective:   VITALS:   Filed Vitals:   03/17/14 2131 03/18/14 0657 03/18/14 2150 03/19/14 0645  BP: 173/68 132/53 128/46 132/56  Pulse: 89 70 73 70  Temp: 97.7 F (36.5 C) 98.1 F (36.7 C) 98.3 F (36.8 C) 98.8 F (37.1 C)  TempSrc: Oral Oral Oral Oral  Resp: 18 16 18 16   Height:      Weight:      SpO2: 100% 100% 99% 98%    Physical Exam RLE: improved cellulitis, no significant drainage. Wiggles toes. 1+DP, decreased sensation  LABS  Results for orders placed during the hospital encounter of 03/17/14 (from the past 24 hour(s))  GLUCOSE, CAPILLARY     Status: Abnormal   Collection Time    03/18/14 11:06 AM      Result Value Ref Range   Glucose-Capillary 235 (*) 70 - 99 mg/dL  HEMOGLOBIN [id number]     Status: Abnormal   Collection Time    03/18/14  3:57 PM      Result Value Ref Range   Hemoglobin A1C 6.4 (*) <5.7 %   Mean Plasma Glucose 137 (*) <117 mg/dL  GLUCOSE, CAPILLARY     Status: Abnormal   Collection Time    03/18/14  4:54 PM      Result Value Ref Range   Glucose-Capillary 313 (*) 70 - 99 mg/dL  GLUCOSE, CAPILLARY     Status: Abnormal   Collection Time    03/18/14  9:46 PM      Result Value Ref Range   Glucose-Capillary 156 (*) 70 - 99 mg/dL  GLUCOSE, CAPILLARY     Status: Abnormal   Collection Time    03/19/14  6:44 AM      Result Value Ref Range   Glucose-Capillary 108 (*) 70 - 99 mg/dL     Assessment/Plan:     Active Problems:   Cellulitis of right foot   Cellulitis of right leg   PLAN: Weight Bearing: WBAT Dressings: per wound team.   MR does not show a fluid collection.   Dispo: I have discussed with Dr. Lajoyce Cornersuda and he will f/u with her as an outpatient to address her DM foot wound. Ok from ortho standpoitn for transition to PO and d/c  I will sign off at this time.    Margarita RanaMURPHY, Delrae Hagey, D 03/19/2014, 8:17 AM   Margarita Ranaimothy Achol Azpeitia, MD Cell [phone]

## 2014-03-19 NOTE — Progress Notes (Signed)
Utilization review completed.  

## 2014-03-19 NOTE — Clinical Social Work Note (Signed)
Clinical Social Work Department BRIEF PSYCHOSOCIAL ASSESSMENT 03/19/2014  Patient:  [name], [name]     Account Number:  [id number]     Admit date:  03/17/2014  Clinical Social Worker:  Myles Lipps  Date/Time:  03/19/2014 01:30 PM  Referred by:  Care Management  Date Referred:  03/19/2014 Referred for  ALF Placement   Other Referral:   Patient from [place] [facility] [place] type:  Patient Other interview type:   Spoke with patient son, Claiborne Billings, over the phone per patient request    PSYCHOSOCIAL DATA Living Status:  FACILITY Admitted from facility:   Level of care:  Assisted Living Primary support name:  Wilder, Amodei  [phone] Primary support relationship to patient:  CHILD, ADULT Degree of support available:   Strong    CURRENT CONCERNS Current Concerns  Post-Acute Placement   Other Concerns:   Return to ALF vs. SNF placement    SOCIAL WORK ASSESSMENT / PLAN Clinical Social Worker met with patient at bedside and spoke with patient son over the phone to offer support and discuss patient needs at discharge.  Patient states that she does not like living at [facility] and would like to return home, however realistic that she is no longer able to live alone.  CSW spoke with patient son regarding return to [facility] vs. SNF.  Patient son requested that [facility] provide an assessment and state that she is unable to return prior to intiating SNF search.  CSW contacted Suzie Portela, who states that a nurse from the facility [facility]) will likely be present to complete assessment before 11am tomorrow.  CSW to follow up with facility and patient son regarding [facility] decision for patient return.  FL2 on chart to be signed.  CSW remains available for support and to facilitate patient discharge needs once medically ready.   Assessment/plan status:  Psychosocial Support/Ongoing Assessment of Needs Other assessment/ plan:   Information/referral to community resources:    Clinical Social Worker arranged assessment with patient current ALF and provided patient son with information regarding possible need for SNF placement.    PATIENT'S/FAMILY'S RESPONSE TO PLAN OF CARE: Patient alert and oriented x3 with her brother and sister in law at bedside.  Patient states that she is forgetful at times and that her son is the primary decision maker. Patient willing to engage in conversation but verbalizes frustrations regarding her current physical limitations. Patient son was adamant that patient return to [facility] if she is able.  Patient son is agreeable to additional therapy needs at facility if deemed necessary.  Patient and patient son appreciative for support and assistance.

## 2014-03-20 LAB — COMPREHENSIVE METABOLIC PANEL
ALT: 11 U/L (ref 0–35)
AST: 17 U/L (ref 0–37)
Albumin: 2.8 g/dL — ABNORMAL LOW (ref 3.5–5.2)
Alkaline Phosphatase: 72 U/L (ref 39–117)
Anion gap: 7 (ref 5–15)
BUN: 16 mg/dL (ref 6–23)
CHLORIDE: 100 meq/L (ref 96–112)
CO2: 34 mEq/L — ABNORMAL HIGH (ref 19–32)
Calcium: 8.8 mg/dL (ref 8.4–10.5)
Creatinine, Ser: 0.68 mg/dL (ref 0.50–1.10)
GFR calc Af Amer: >90 mL/min (ref >90)
GFR calc non Af Amer: 82 mL/min — ABNORMAL LOW (ref >90)
Glucose, Bld: 128 mg/dL — ABNORMAL HIGH (ref 70–99)
Potassium: 3.4 mEq/L — ABNORMAL LOW (ref 3.7–5.3)
Sodium: 141 mEq/L (ref 137–147)
Total Bilirubin: 0.2 mg/dL — ABNORMAL LOW (ref 0.3–1.2)
Total Protein: 5.9 g/dL — ABNORMAL LOW (ref 6.0–8.3)

## 2014-03-20 LAB — CBC
HCT: 38.1 % (ref 36.0–46.0)
HEMOGLOBIN: 12.3 g/dL (ref 12.0–15.0)
MCH: 28.2 pg (ref 26.0–34.0)
MCHC: 32.3 g/dL (ref 30.0–36.0)
MCV: 87.4 fL (ref 78.0–100.0)
PLATELETS: 241 10*3/uL (ref 150–400)
RBC: 4.36 MIL/uL (ref 3.87–5.11)
RDW: 13.1 % (ref 11.5–15.5)
WBC: 6.4 10*3/uL (ref 4.0–10.5)

## 2014-03-20 LAB — GLUCOSE, CAPILLARY
Glucose-Capillary: 106 mg/dL — ABNORMAL HIGH (ref 70–99)
Glucose-Capillary: 214 mg/dL — ABNORMAL HIGH (ref 70–99)

## 2014-03-20 MED ORDER — OLOPATADINE HCL 0.1 % OP SOLN: 1.0000 [drp] | Freq: Two times a day (BID) | OPHTHALMIC | Status: DC | PRN

## 2014-03-20 MED ORDER — POTASSIUM CHLORIDE CRYS ER 20 MEQ PO TBCR
40.0000 meq | EXTENDED_RELEASE_TABLET | Freq: Once | ORAL | Status: AC
  Administered 2014-03-20: 40 meq via ORAL
  Filled 2014-03-20: qty 2

## 2014-03-20 MED ORDER — LORAZEPAM 0.5 MG PO TABS: 0.5000 mg | ORAL_TABLET | Freq: Two times a day (BID) | ORAL | Status: DC

## 2014-03-20 MED ORDER — AMOXICILLIN-POT CLAVULANATE 875-125 MG PO TABS: 1.0000 | ORAL_TABLET | Freq: Two times a day (BID) | ORAL | Status: AC

## 2014-03-20 MED ORDER — DOXYCYCLINE HYCLATE 50 MG PO CAPS: 100.0000 mg | ORAL_CAPSULE | Freq: Two times a day (BID) | ORAL | Status: DC

## 2014-03-20 MED ORDER — MORPHINE SULFATE ER 15 MG PO TBCR: 15.0000 mg | EXTENDED_RELEASE_TABLET | Freq: Two times a day (BID) | ORAL | Status: DC

## 2014-03-20 MED ORDER — AMOXICILLIN-POT CLAVULANATE 875-125 MG PO TABS
1.0000 | ORAL_TABLET | Freq: Two times a day (BID) | ORAL | Status: DC
Start: 2014-03-20 — End: 2014-03-20

## 2014-03-20 MED ORDER — ACETAMINOPHEN 325 MG PO TABS: 650.0000 mg | ORAL_TABLET | Freq: Four times a day (QID) | ORAL | Status: AC | PRN

## 2014-03-20 NOTE — Progress Notes (Signed)
Inpatient Diabetes Program Recommendations  AACE/ADA: New Consensus Statement on Inpatient Glycemic Control (2013)  Target Ranges:  Prepandial:   less than 140 mg/dL      Peak postprandial:   less than 180 mg/dL (1-2 hours)      Critically ill patients:  140 - 180 mg/dL   Results for Len ChildsBRAME, Imelda L (MRN [id number]) as of 03/20/2014 09:34  Ref. Range 03/18/2014 11:06 03/18/2014 16:54 03/18/2014 21:46 03/19/2014 06:44 03/19/2014 11:04 03/19/2014 16:16 03/19/2014 21:54 03/20/2014 06:17  Glucose-Capillary Latest Range: 70-99 mg/dL 409235 (H) 811313 (H) 914156 (H) 108 (H) 219 (H) 316 (H) 172 (H) 106 (H)  Reason for Assessment: elevated BG  Diabetes history: Type 2 Outpatient Diabetes medications: Lantus 15 units Qhs, Novolog 5units TID Current orders for Inpatient glycemic control: Lantus 15 units Qhs, Novolog correction 0-9units with meals   Based on current CBGs, please consider adding  Novolog 3 units ac meals   Susette RacerJulie Chip Canepa, RN, [place], [place]HA, CDE Diabetes Coordinator Inpatient Diabetes Program  [phone] (Team Pager) [phone] Patrcia Dolly([place] Office) 03/20/2014 9:46 AM

## 2014-03-20 NOTE — Progress Notes (Signed)
Per MD- patient is medically stable for d/c today. Per son's request- [name] [place] to determine if patient could return to ALF.  After review- the facility notified this CSW that they would accept patient back. CSW spoke to son Omelia BlackwaterLuther, patient and her husband.  All are agreeable to d/c today back to ALF.  PT will be arranged by facility.  Nursing notified of d/c; no further CSW needs identified. CSW signing off.  Lorri Frederickonna T. Jaci LazierCrowder, KentuckyLCSW [phone]

## 2014-03-20 NOTE — Care Management Note (Signed)
CARE MANAGEMENT NOTE 03/20/2014  Patient:  [name],[name]   Account Number:  [id number]  Date Initiated:  03/20/2014  Documentation initiated by:  Vance PeperBRADY,Siriah Treat  Subjective/Objective Assessment:   80 yr old female admitted with cellulitis of right leg.     Action/Plan:   [facility] Social worker has spoken with [facility].   Anticipated DC Date:  03/20/2014   Anticipated DC Plan:  ASSISTED LIVING / REST HOME  In-house referral  Clinical Social Worker      DC Planning Services  CM consult      Choice offered to / List presented to:     DME arranged  NA        HH arranged  NA      Status of service:  Completed, signed off Medicare Important Message given?  YES (If response is "NO", the following Medicare IM given date fields will be blank) Date Medicare IM given:  03/20/2014 Medicare IM given by:  Vance PeperBRADY,Gudrun Axe Date Additional Medicare IM given:   Additional Medicare IM given by:    Discharge Disposition:  ASSISTED LIVING  Per UR Regulation:  Reviewed for med. necessity/level of care/duration of stay

## 2014-03-20 NOTE — Plan of Care (Signed)
Problem: Phase III Progression Outcomes Goal: Voiding independently Outcome: Not Met (add Reason) Patient is incontinent of urine/stool

## 2014-03-20 NOTE — Progress Notes (Signed)
PT Cancellation Note  Patient Details Name: [name] MRN: [id number] DOB: 11/13/1932   Cancelled Treatment:    Reason Eval/Treat Not Completed: Other (comment). Attempted to see patient however patient requesting to wait until after lunch. Patient also has visitors in the room at this time. Will follow up as time allows    Fredrich BirksRobinette, [name] 03/20/2014, 11:32 AM

## 2014-03-20 NOTE — Care Management Note (Signed)
03/20/14 1510 Tonya PeperSusan Raymie Trani, RN BSN Case Manager Case Manager contacted [name]ia[facility], at [phone], confirmed that there facility will arrange for Home Health PT/OT, she isnt sure they will provide aide. Orders are in packet.

## 2014-03-20 NOTE — Discharge Summary (Addendum)
Physician Discharge Summary  [name] MRN: [id number] DOB/AGE: 78-Jul-1934 78 y.o.  PCP: Irven Shelling, MD   Admit date: 03/17/2014 Discharge date: 03/20/2014  Discharge Diagnoses:      Cellulitis of right foot   Cellulitis of right leg Hypertension Insulin-dependent diabetes  Followup recommendations Recheck CBC and BMP in one week to monitor her kidney function and low potassium Dr. Sharol Given and he will f/u with her as an outpatient to address her DM foot wound  Patient to continue with oral antibiotics for 2 weeks    Medication List    STOP taking these medications       levofloxacin 500 MG tablet  Commonly known as:  LEVAQUIN     oxycodone 5 MG capsule  Commonly known as:  OXY-IR      TAKE these medications       acetaminophen 325 MG tablet  Commonly known as:  TYLENOL  Take 2 tablets (650 mg total) by mouth every 6 (six) hours as needed for mild pain or fever.     amiloride-hydrochlorothiazide 5-50 MG tablet  Commonly known as:  MODURETIC  Take 0.5 tablets by mouth every morning.     amoxicillin-clavulanate 875-125 MG per tablet  Commonly known as:  AUGMENTIN  Take 1 tablet by mouth 2 (two) times daily.     aspirin EC 81 MG tablet  Take 81 mg by mouth every morning.     calcium citrate-vitamin D 315-200 MG-UNIT per tablet  Commonly known as:  CITRACAL+D  Take 2 tablets by mouth every morning.     cholecalciferol 1000 UNITS tablet  Commonly known as:  VITAMIN D  Take 1,000 Units by mouth every morning.     conjugated estrogens vaginal cream  Commonly known as:  PREMARIN  Place 1 Applicatorful vaginally 2 (two) times a week. Uses on Tues and Fri     doxycycline 50 MG capsule  Commonly known as:  VIBRAMYCIN  Take 2 capsules (100 mg total) by mouth 2 (two) times daily.     DULoxetine 30 MG capsule  Commonly known as:  CYMBALTA  Take 30 mg by mouth every morning.     fexofenadine 180 MG tablet  Commonly known as:  ALLEGRA  Take 180  mg by mouth every morning.     GLUCERNA Liqd  Take 237 mLs by mouth 3 (three) times daily. Vanilla only     insulin aspart 100 UNIT/ML injection  Commonly known as:  novoLOG  Inject 5 Units into the skin 3 (three) times daily before meals.     insulin glargine 100 UNIT/ML injection  Commonly known as:  LANTUS  Inject 16 Units into the skin at bedtime.     iron polysaccharides 150 MG capsule  Commonly known as:  NIFEREX  Take 150 mg by mouth 2 (two) times daily.     LORazepam 0.5 MG tablet  Commonly known as:  ATIVAN  Take 1 tablet (0.5 mg total) by mouth every 12 (twelve) hours. For anxiety.     methylcellulose 1 % ophthalmic solution  Commonly known as:  ARTIFICIAL TEARS  Place 1 drop into both eyes daily as needed (for dry eyes).     morphine 15 MG 12 hr tablet  Commonly known as:  MS CONTIN  Take 1 tablet (15 mg total) by mouth every 12 (twelve) hours.     olopatadine 0.1 % ophthalmic solution  Commonly known as:  PATANOL  Place 1 drop into both eyes 2 (two)  times daily as needed (itching/watery eyes).     omeprazole 20 MG capsule  Commonly known as:  PRILOSEC  Take 20 mg by mouth every morning.     polyethylene glycol packet  Commonly known as:  MIRALAX / GLYCOLAX  Take 17 g by mouth daily as needed for moderate constipation.     potassium chloride SA 20 MEQ tablet  Commonly known as:  K-DUR,KLOR-CON  Take 20 mEq by mouth every morning.     SENNA-S 8.6-50 MG per tablet  Generic drug:  senna-docusate  Take 2 tablets by mouth at bedtime.        Discharge Condition: *Stable   Disposition: 03-Skilled Nursing Facility   Consults: Orthopedics  Significant Diagnostic Studies: Mr Foot Right W Wo Contrast  03/18/2014   CLINICAL DATA:  Pain, swelling and open wound on the great toe.  EXAM: MRI OF THE RIGHT FOREFOOT WITHOUT AND WITH CONTRAST  TECHNIQUE: Multiplanar, multisequence MR imaging was performed both before and after administration of intravenous  contrast.  CONTRAST:  79mL MULTIHANCE GADOBENATE DIMEGLUMINE 529 MG/ML IV SOLN  COMPARISON:  Radiographs 03/17/2014  FINDINGS: There is but open wound at the tip of the great toe with surrounding soft tissue swelling/ edema and enhancement all consistent with cellulitis. I do not see a discrete drainable soft tissue abscess. There is osteomyelitis involving the distal phalanx with diffuse signal abnormality and enhancement. No obvious changes of septic arthritis involving the interphalangeal joint. The remaining bony structures are unremarkable. There is moderate diffuse cellulitis involving the forefoot. There is also myositis but no findings for pyomyositis.  IMPRESSION: Marked cellulitis involving the great toe. There is also diffuse cellulitis and myositis involving the forefoot.  Osteomyelitis involving the distal phalanx of the great toe. No findings for septic arthritis at the interphalangeal joint.   Electronically Signed   By: Loralie Champagne M.D.   On: 03/18/2014 15:28   Dg Foot Complete Right  03/17/2014   CLINICAL DATA:  80 year old female with pain and swelling right great toe foot and lower extremity. Wound infection. Initial encounter.  EXAM: RIGHT FOOT COMPLETE - 3+ VIEW  COMPARISON:  None.  FINDINGS: Generalized soft tissue swelling. No subcutaneous gas. The tuft of the right great toe appears indistinct (arrows). There is overlying soft tissue swelling and irregularity. The right first distal phalanx otherwise appears intact. The right IP joint is within normal limits, as is the first MCP joint.  Other osseous structures in the right foot appear intact. There is prominent calcified atherosclerosis in the right foot. Mortise joint not well evaluated on these images. Calcaneus appears intact.  IMPRESSION: 1. Indistinctness of the tuft of the right great toe suspicious for acute osteomyelitis. 2. Generalized soft tissue swelling. No subcutaneous gas. Calcified peripheral vascular disease. 3. No  other acute osseous abnormality identified in the right foot.   Electronically Signed   By: Augusto Gamble M.D.   On: 03/17/2014 13:47       Microbiology: Recent Results (from the past 240 hour(s))  CULTURE, BLOOD (ROUTINE X 2)     Status: None   Collection Time    03/17/14 12:35 PM      Result Value Ref Range Status   Specimen Description BLOOD RIGHT ARM   Final   Special Requests BOTTLES DRAWN AEROBIC AND ANAEROBIC [facility] EACH   Final   Culture  Setup Time     Final   Value: 03/17/2014 20:30     Performed at [facility]  Culture     Final   Value:        BLOOD CULTURE RECEIVED NO GROWTH TO DATE CULTURE WILL BE HELD FOR 5 DAYS BEFORE ISSUING A FINAL NEGATIVE REPORT     Performed at [facility]   Report Status PENDING   Incomplete  CULTURE, BLOOD (ROUTINE X 2)     Status: None   Collection Time    03/17/14 12:40 PM      Result Value Ref Range Status   Specimen Description BLOOD LEFT ARM   Final   Special Requests BOTTLES DRAWN AEROBIC AND ANAEROBIC 10CC   Final   Culture  Setup Time     Final   Value: 03/17/2014 20:30     Performed at [facility]   Culture     Final   Value:        BLOOD CULTURE RECEIVED NO GROWTH TO DATE CULTURE WILL BE HELD FOR 5 DAYS BEFORE ISSUING A FINAL NEGATIVE REPORT     Performed at [facility]   Report Status PENDING   Incomplete     Labs: Results for orders placed during the hospital encounter of 03/17/14 (from the past 48 hour(s))  HEMOGLOBIN A1C     Status: Abnormal   Collection Time    03/18/14  3:57 PM      Result Value Ref Range   Hemoglobin A1C 6.4 (*) <5.7 %   Comment: (NOTE)                                                                               According to the ADA Clinical Practice Recommendations for 2011, when     HbA1c is used as a screening test:      >=6.5%   Diagnostic of Diabetes Mellitus               (if abnormal result is confirmed)     5.7-6.4%   Increased risk of developing Diabetes  Mellitus     References:Diagnosis and Classification of Diabetes Mellitus,Diabetes     HENI,7782,42(PNTIR 1):S62-S69 and Standards of Medical Care in             Diabetes - 2011,Diabetes Care,2011,34 (Suppl 1):S11-S61.   Mean Plasma Glucose 137 (*) <117 mg/dL   Comment: Performed at [place], [place]     Status: Abnormal   Collection Time    03/18/14  4:54 PM      Result Value Ref Range   Glucose-Capillary 313 (*) 70 - 99 mg/dL  GLUCOSE, CAPILLARY     Status: Abnormal   Collection Time    03/18/14  9:46 PM      Result Value Ref Range   Glucose-Capillary 156 (*) 70 - 99 mg/dL  GLUCOSE, CAPILLARY     Status: Abnormal   Collection Time    03/19/14  6:44 AM      Result Value Ref Range   Glucose-Capillary 108 (*) 70 - 99 mg/dL  GLUCOSE, CAPILLARY     Status: Abnormal   Collection Time    03/19/14 11:04 AM      Result Value Ref Range   Glucose-Capillary 219 (*) 70 - 99  mg/dL   Comment 1 Notify RN    GLUCOSE, CAPILLARY     Status: Abnormal   Collection Time    03/19/14  4:16 PM      Result Value Ref Range   Glucose-Capillary 316 (*) 70 - 99 mg/dL   Comment 1 Notify RN    GLUCOSE, CAPILLARY     Status: Abnormal   Collection Time    03/19/14  9:54 PM      Result Value Ref Range   Glucose-Capillary 172 (*) 70 - 99 mg/dL   Comment 1 Notify RN    CBC     Status: None   Collection Time    03/20/14  4:40 AM      Result Value Ref Range   WBC 6.4  4.0 - 10.5 K/uL   RBC 4.36  3.87 - 5.11 MIL/uL   Hemoglobin 12.3  12.0 - 15.0 g/dL   HCT 38.1  36.0 - 46.0 %   MCV 87.4  78.0 - 100.0 fL   MCH 28.2  26.0 - 34.0 pg   MCHC 32.3  30.0 - 36.0 g/dL   RDW 13.1  11.5 - 15.5 %   Platelets 241  150 - 400 K/uL  COMPREHENSIVE METABOLIC PANEL     Status: Abnormal   Collection Time    03/20/14  4:40 AM      Result Value Ref Range   Sodium 141  137 - 147 mEq/L   Potassium 3.4 (*) 3.7 - 5.3 mEq/L   Chloride 100  96 - 112 mEq/L   CO2 34 (*) 19 - 32 mEq/L   Glucose, Bld  128 (*) 70 - 99 mg/dL   BUN 16  6 - 23 mg/dL   Creatinine, Ser 0.68  0.50 - 1.10 mg/dL   Calcium 8.8  8.4 - 10.5 mg/dL   Total Protein 5.9 (*) 6.0 - 8.3 g/dL   Albumin 2.8 (*) 3.5 - 5.2 g/dL   AST 17  0 - 37 U/L   ALT 11  0 - 35 U/L   Alkaline Phosphatase 72  39 - 117 U/L   Total Bilirubin 0.2 (*) 0.3 - 1.2 mg/dL   GFR calc non Af Amer 82 (*) >90 mL/min   GFR calc Af Amer >90  >90 mL/min   Comment: (NOTE)     The eGFR has been calculated using the CKD EPI equation.     This calculation has not been validated in all clinical situations.     eGFR's persistently <90 mL/min signify possible Chronic Kidney     Disease.   Anion gap 7  5 - 15  GLUCOSE, CAPILLARY     Status: Abnormal   Collection Time    03/20/14  6:17 AM      Result Value Ref Range   Glucose-Capillary 106 (*) 70 - 99 mg/dL  GLUCOSE, CAPILLARY     Status: Abnormal   Collection Time    03/20/14 11:09 AM      Result Value Ref Range   Glucose-Capillary 214 (*) 70 - 99 mg/dL     HPI : [name] is a 81 y.o. female with PMH significant for Diabetes, Hypertension, on Chronic pain medications, who presents to [facility] with Right foot, great toe pain, swelling, redness, that started 5 days prior to admission. Patient relates that the skin of her great toe was pilling, so she started to removed it. No history of fever, no nausea, no vomiting. Denies chest pain  or dyspnea. She does relates dysuria.    HOSPITAL COURSE:  1-Right LE cellulitis, great toe possible osteomyelitis. X ray with concern for great toe osteomyelitis. MRI showed osteomyelitis of the distal phalanx of the great toe, septic arthritis of the interphalangeal joint, marked cellulitis of the great toe.. initially started on Vancomycin and Zosyn. Dr Percell Miller consulted, as per orthopedics, no indication for surgery at this time, blood culture no growth so far , patient to followup with Dr. Sharol Given. Patient transitioned to Augmentin and doxycycline for 2  weeks  2-Diabetes: Continue home regimen of Lantus and NovoLog, Hemoglobin A1c 6.4  3-Chronic pain ; Continue with Morphine and oxycodone , these medications were reviewed , patient also on lorazepam, will monitor for sedation, if there is any indication of renal insufficiency please discontinue long-acting narcotics immediately, recheck renal function twice a month 4-HTN; patient on amiloride-HCTZ as well as HCTZ Given her hypokalemia, would recommend continuation of the combination medication Recheck potassium in one week 5-Dysuria; UA negative 6-Depression; patient currently on Cymbalta,     Discharge Exam:   Blood pressure 131/61, pulse 69, temperature 98 F (36.7 C), temperature source Oral, resp. rate 16, height _0  (1.626 m), weight 68.04 kg (150 lb), SpO2 98.00%. General: Alert, no acute distress  Cardiovascular: No pedal edema  Respiratory: No cyanosis, no use of accessory musculature  GI: No organomegaly, abdomen is soft and non-tender  Skin: No lesions in the area of chief complaint other than stated below.  Neurologic: Sensation intact distally  Psychiatric: Patient is competent for consent with normal mood and affect  Lymphatic: No axillary or cervical lymphadenopathy     RLE: She has mild swelling of the foot and ankle with erythema to the distal leg. 1st toe has a 1x0.5cm wound on the tip that is dry. I am unable to express fluid from this. Her sensation is decressed to both feet. 1+DP. +TA/GS/EHL  Other extremities are atraumatic with painless ROM and NVI          Follow-up Information   Follow up with Irven Shelling, MD. Schedule an appointment as soon as possible for a visit in 1 week.   Specialty:  Internal Medicine   Contact information:   [address], [place] [address] [phone]       Follow up with Newt Minion, MD. Schedule an appointment as soon as possible for a visit in 1 week.   Specialty:  Orthopedic Surgery    Contact information:   [place] [address] [phone]       Signed: Reyne Dumas 03/20/2014, 11:49 AM

## 2014-03-22 ENCOUNTER — Encounter (HOSPITAL_COMMUNITY): Payer: Self-pay | Admitting: Emergency Medicine

## 2014-03-22 ENCOUNTER — Emergency Department (HOSPITAL_COMMUNITY)
Admission: EM | Admit: 2014-03-22 | Discharge: 2014-03-22 | Disposition: A | Discharge status: Home / Self Care | Payer: Medicare Other | Attending: Emergency Medicine | Admitting: Emergency Medicine

## 2014-03-22 DIAGNOSIS — Z90710 Acquired absence of both cervix and uterus: Secondary | ICD-10-CM | POA: Insufficient documentation

## 2014-03-22 DIAGNOSIS — F329 Major depressive disorder, single episode, unspecified: Secondary | ICD-10-CM | POA: Diagnosis not present

## 2014-03-22 DIAGNOSIS — Z8619 Personal history of other infectious and parasitic diseases: Secondary | ICD-10-CM | POA: Diagnosis not present

## 2014-03-22 DIAGNOSIS — Z7982 Long term (current) use of aspirin: Secondary | ICD-10-CM | POA: Diagnosis not present

## 2014-03-22 DIAGNOSIS — Z792 Long term (current) use of antibiotics: Secondary | ICD-10-CM | POA: Diagnosis not present

## 2014-03-22 DIAGNOSIS — Z79899 Other long term (current) drug therapy: Secondary | ICD-10-CM | POA: Diagnosis not present

## 2014-03-22 DIAGNOSIS — E119 Type 2 diabetes mellitus without complications: Secondary | ICD-10-CM | POA: Insufficient documentation

## 2014-03-22 DIAGNOSIS — G589 Mononeuropathy, unspecified: Secondary | ICD-10-CM | POA: Diagnosis not present

## 2014-03-22 DIAGNOSIS — Z794 Long term (current) use of insulin: Secondary | ICD-10-CM | POA: Insufficient documentation

## 2014-03-22 DIAGNOSIS — Z87448 Personal history of other diseases of urinary system: Secondary | ICD-10-CM | POA: Insufficient documentation

## 2014-03-22 DIAGNOSIS — M869 Osteomyelitis, unspecified: Secondary | ICD-10-CM

## 2014-03-22 DIAGNOSIS — K219 Gastro-esophageal reflux disease without esophagitis: Secondary | ICD-10-CM | POA: Insufficient documentation

## 2014-03-22 DIAGNOSIS — I10 Essential (primary) hypertension: Secondary | ICD-10-CM | POA: Diagnosis not present

## 2014-03-22 DIAGNOSIS — L03119 Cellulitis of unspecified part of limb: Principal | ICD-10-CM

## 2014-03-22 DIAGNOSIS — F3289 Other specified depressive episodes: Secondary | ICD-10-CM | POA: Insufficient documentation

## 2014-03-22 DIAGNOSIS — L03115 Cellulitis of right lower limb: Secondary | ICD-10-CM

## 2014-03-22 DIAGNOSIS — M129 Arthropathy, unspecified: Secondary | ICD-10-CM | POA: Insufficient documentation

## 2014-03-22 DIAGNOSIS — L089 Local infection of the skin and subcutaneous tissue, unspecified: Secondary | ICD-10-CM | POA: Diagnosis present

## 2014-03-22 DIAGNOSIS — M8618 Other acute osteomyelitis, other site: Secondary | ICD-10-CM | POA: Diagnosis not present

## 2014-03-22 DIAGNOSIS — L02619 Cutaneous abscess of unspecified foot: Secondary | ICD-10-CM | POA: Insufficient documentation

## 2014-03-22 NOTE — ED Notes (Signed)
Per [facility]. Per Dr. Valentina LucksGriffin ? Cellulitis to RT Greater toe. SEE REPORT. [facility] ? Without proper wound care instructions. Pt now presents with ? Infection.

## 2014-03-22 NOTE — Progress Notes (Signed)
  CARE MANAGEMENT ED NOTE 03/22/2014  Patient:  [name],[name]   Account Number:  [id number]  Date Initiated:  03/22/2014  Documentation initiated by:  Edd ArbourGIBBS,Armstrong Creasy  Subjective/Objective Assessment:   80 yr old [employer]  medicare complete [place] pt residing at [facility] Cellulitis of Great toe     Subjective/Objective Assessment Detail:   [facility]14 and [facility] CM noted [name] at [phone] would arrange [facility] and possible an aide for pt per 03/20/14 EPIC note  Pt and husband at bedside states they do not know the name of the agency the female staff member who arrived to see her Pt is folowed by [name] at [facility]  Pt to be written orders after pt seen by wound care specialist     Action/Plan:   ED [facility] 03/20/14 to ALF for dressing change orders per [name].  CM left a voice message for Tonya LaymanLindsey at [facility] [phone] after reviewing EPIC notes & speaking with ED RN, pt & Husband   Action/Plan Detail:   1527 ED CM received a call from  [phone], Tonya [place]Lindsey from [facility] who [facility] but unsure of the name ("[name] something like that" She will given ED CM # to the agency   Anticipated DC Date:  03/22/2014     Status Recommendation to Physician:   Result of Recommendation:    Other ED Services  Consult Working Plan    DC Planning Services  Other  Outpatient Services - Pt will follow up   [facility]   Choice offered to / List presented to:  C-1 Patient          Status of service:  Completed, signed off  ED Comments:   ED Comments Detail:  03/22/14 Fax confirmation received for [phone] but fax failure again for [phone] Return call from [name] to re verify fax number [phone] and to inform her Cm would resend and also send to [phone] called [name] to request another fax number Spoke with Tonya DandyMary to  obtain [phone] Failed fax report when CM faxed d/c instructions to [name]/Lindsay at [phone] ED CM received a call from [place] ([phone]) of the [facility]. She returned call to provided fax number of [phone] for CM to send discharge instructions including dressing change order  CM spoke with [name] ED RN ([facility])  Final diagnoses: Cellulitis of right foot Toe osteomyelitis, right   Follow-up With [name], [name] Schedule an appointment as soon as possible for a visit  [address] [place] [id number] [phone]   Dressing change Instructions Any nonadherent, low adherent or even saline-soaked gauze dressing is fine. If available a semipermeable hydrocolloid dressing is ok too, but not necessary. Dressing needs to be changed daily and wound examined.

## 2014-03-22 NOTE — Discharge Instructions (Signed)
Any nonadherent, low adherent or even saline-soaked gauze dressing is fine. If available a semipermeable hydrocolloid dressing is ok too, but not necessary. Dressing needs to be changed daily and wound examined.

## 2014-03-22 NOTE — ED Notes (Signed)
Patient cleaned up and dry

## 2014-03-22 NOTE — ED Notes (Signed)
PTAR called  

## 2014-03-22 NOTE — ED Notes (Signed)
Patient wash changed and cleaned up

## 2014-03-22 NOTE — ED Notes (Signed)
MD at bedside. 

## 2014-03-22 NOTE — ED Notes (Signed)
Bed: WA10 Expected date: 03/22/14 Expected time: 12:17 PM Means of arrival: Ambulance Comments: Infected toe

## 2014-03-22 NOTE — ED Provider Notes (Signed)
CSN: [id number]     Arrival date & time 03/22/14  1246 History   First MD Initiated Contact with Patient 03/22/14 1248     Chief Complaint  Patient presents with  . Wound Infection  . Blood Sugar Problem     (Consider location/radiation/quality/duration/timing/severity/associated sxs/prior Treatment) HPI  [name] presenting for wound care. Recent admit for toe cellulitis. Apparently not clear about continued wound care which is why coming to ED. Hx from records and husband at bedside. No significant change in appearance since discharge per husband. No fever. Husband not sure about how continued wound care should be done and concerned facility pt is at doesn't know either. On abx. Supposed to follow up with Dr Lajoyce Corners, ortho, after DC.    Past Medical History  Diagnosis Date  . Incontinence of urine   . Diarrhea   . Neuropathy     bilateral feet  . GERD (gastroesophageal reflux disease)   . Depression   . Urinary, incontinence, stress female   . History of shingles 2009  . Degenerative lumbar spinal stenosis   . Type II diabetes mellitus     Type 2 IDDM  X 40 years;   . Arthritis     lower back/hands  . Altered mental status 01/13/2013  . Hypertension    Past Surgical History  Procedure Laterality Date  . Incontinence surgery  2005  . Tonsillectomy  1964  . Abdominal hysterectomy  1982  . Ovarian cyst surgery  1972  . Cholecystectomy  1988  . Cystoscopy  2009    macroplastique inj  . Rectocele repair  10/15/2011    Procedure: POSTERIOR REPAIR (RECTOCELE);  Surgeon: Kathi Ludwig, MD;  Location: [facility];  Service: Urology;;  posterior vaginal vault repair with sacral spinus repair with graft  . Cystoscopy  10/15/2011    Procedure: CYSTOSCOPY;  Surgeon: Kathi Ludwig, MD;  Location: [facility];  Service: Urology;  Laterality: N/A;  . Flexible sigmoidoscopy  02/28/2012    Procedure: FLEXIBLE SIGMOIDOSCOPY;  Surgeon: Willis Modena, MD;   Location: [facility];  Service: Endoscopy;  Laterality: N/A;  . Breast biopsy  1990    bilaterally  . Cataract extraction w/ intraocular lens implant    . Posterior fusion lumbar spine  05/09/2012   Family History  Problem Relation Age of Onset  . Osteoarthritis Sister   . Osteoarthritis Brother    History  Substance Use Topics  . Smoking status: Never Smoker   . Smokeless tobacco: Never Used  . Alcohol Use: No   OB History   Grav Para Term Preterm Abortions TAB SAB Ect Mult Living                 Review of Systems  All systems reviewed and negative, other than as noted in HPI.    Allergies  Ciprofloxacin; Lidoderm; Ace inhibitors; Angiotensin receptor blockers; Glucotrol; Triple antibiotic; Neosporin; Sulfa antibiotics; and Zyrtec  Home Medications   Prior to Admission medications   Medication Sig Start Date End Date Taking? Authorizing Provider  acetaminophen (TYLENOL) 325 MG tablet Take 2 tablets (650 mg total) by mouth every 6 (six) hours as needed for mild pain or fever. 03/20/14  Yes Richarda Overlie, MD  amiloride-hydrochlorothiazide (MODURETIC) 5-50 MG tablet Take 0.5 tablets by mouth every morning.    Yes Historical Provider, MD  amoxicillin-clavulanate (AUGMENTIN) 875-125 MG per tablet Take 1 tablet by mouth 2 (two) times daily. 03/20/14 04/04/14 Yes Richarda Overlie, MD  aspirin  EC 81 MG tablet Take 81 mg by mouth every morning.    Yes Historical Provider, MD  calcium citrate-vitamin D (CITRACAL+D) 315-200 MG-UNIT per tablet Take 2 tablets by mouth every morning.    Yes Historical Provider, MD  cholecalciferol (VITAMIN D) 1000 UNITS tablet Take 1,000 Units by mouth every morning.    Yes Historical Provider, MD  conjugated estrogens (PREMARIN) vaginal cream Place 1 Applicatorful vaginally 2 (two) times a week. Uses on Tues and Fri   Yes Historical Provider, MD  doxycycline (VIBRAMYCIN) 50 MG capsule Take 2 capsules (100 mg total) by mouth 2 (two) times daily. 03/20/14  Yes  Richarda OverlieNayana Abrol, MD  DULoxetine (CYMBALTA) 30 MG capsule Take 30 mg by mouth every morning.    Yes Historical Provider, MD  fexofenadine (ALLEGRA) 180 MG tablet Take 180 mg by mouth every morning.    Yes Historical Provider, MD  GLUCERNA (GLUCERNA) LIQD Take 237 mLs by mouth 3 (three) times daily. Vanilla only   Yes Historical Provider, MD  insulin aspart (NOVOLOG) 100 UNIT/ML injection Inject 5 Units into the skin 3 (three) times daily before meals.    Yes Historical Provider, MD  insulin glargine (LANTUS) 100 UNIT/ML injection Inject 16 Units into the skin at bedtime.    Yes Historical Provider, MD  iron polysaccharides (NIFEREX) 150 MG capsule Take 150 mg by mouth 2 (two) times daily.   Yes Historical Provider, MD  LORazepam (ATIVAN) 0.5 MG tablet Take 1 tablet (0.5 mg total) by mouth every 12 (twelve) hours. For anxiety. 03/20/14  Yes Richarda OverlieNayana Abrol, MD  methylcellulose (ARTIFICIAL TEARS) 1 % ophthalmic solution Place 1 drop into both eyes daily as needed (for dry eyes).   Yes Historical Provider, MD  morphine (MS CONTIN) 15 MG 12 hr tablet Take 1 tablet (15 mg total) by mouth every 12 (twelve) hours. 03/20/14  Yes Richarda OverlieNayana Abrol, MD  olopatadine (PATANOL) 0.1 % ophthalmic solution Place 1 drop into both eyes 2 (two) times daily as needed (itching/watery eyes). 03/20/14  Yes Richarda OverlieNayana Abrol, MD  omeprazole (PRILOSEC) 20 MG capsule Take 20 mg by mouth every morning.    Yes Historical Provider, MD  polyethylene glycol (MIRALAX / GLYCOLAX) packet Take 17 g by mouth daily as needed for moderate constipation.  03/03/12  Yes Lillia MountainJohn Joseph Griffin, MD  potassium chloride SA (K-DUR,KLOR-CON) 20 MEQ tablet Take 20 mEq by mouth every morning.    Yes Historical Provider, MD  senna-docusate (SENNA-S) 8.6-50 MG per tablet Take 2 tablets by mouth at bedtime.   Yes Historical Provider, MD   BP 129/51  Pulse 68  Temp(Src) 99.1 F (37.3 C) (Oral)  Resp 19  SpO2 98% Physical Exam  Nursing note and vitals  reviewed. Constitutional: She appears well-developed and well-nourished. No distress.  HENT:  Head: Normocephalic and atraumatic.  Eyes: Conjunctivae are normal. Right eye exhibits no discharge. Left eye exhibits no discharge.  Neck: Neck supple.  Cardiovascular: Normal rate, regular rhythm and normal heart sounds.  Exam reveals no gallop and no friction rub.   No murmur heard. Pulmonary/Chest: Effort normal and breath sounds normal. No respiratory distress.  Abdominal: Soft. She exhibits no distension. There is no tenderness.  Musculoskeletal:  Ulceration to distal R great toe. Swelling of entire toe and erythema. No drainage.   Neurological: She is alert.  Skin: Skin is warm and dry.  Psychiatric: Thought content normal.    ED Course  Procedures (including critical care time) Labs Review Labs Reviewed - No data to  display  Imaging Review No results found.   EKG Interpretation None      MDM   Final diagnoses:  Cellulitis of right foot  Toe osteomyelitis, right    [name] with recent admit for toe cellulitis. Apparently continued wound would care not clear. Discussed with pt's husband. Written in DC instructions. Needs to follow-up with Dr Lajoyce Corners as previously recommended.     Raeford Razor, MD 03/28/14 [phone]

## 2014-03-23 LAB — CULTURE, BLOOD (ROUTINE X 2)
CULTURE: NO GROWTH
Culture: NO GROWTH

## 2014-03-27 ENCOUNTER — Other Ambulatory Visit (HOSPITAL_COMMUNITY): Payer: Self-pay | Admitting: Orthopedic Surgery

## 2014-03-28 ENCOUNTER — Encounter (HOSPITAL_COMMUNITY): Payer: Self-pay | Admitting: Pharmacy Technician

## 2014-03-29 ENCOUNTER — Encounter (HOSPITAL_COMMUNITY): Payer: Self-pay | Admitting: *Deleted

## 2014-03-29 MED ORDER — CEFAZOLIN SODIUM-DEXTROSE 2-3 GM-% IV SOLR
2.0000 g | INTRAVENOUS | Status: AC
  Administered 2014-03-30: 2 g via INTRAVENOUS
  Filled 2014-03-29: qty 50

## 2014-03-29 NOTE — Progress Notes (Signed)
Called and spoke with pt's son, [name] for pre-op call. He states pt sometimes gets confused due to medications. He verified, allergies, medical/surgical history and meds. I gave him pre-op instructions, time of arrival, NPO after midnight tonight, meds to take in AM. He voiced understanding.

## 2014-03-30 ENCOUNTER — Ambulatory Visit (HOSPITAL_COMMUNITY): Payer: Medicare Other

## 2014-03-30 ENCOUNTER — Ambulatory Visit (HOSPITAL_COMMUNITY): Payer: Medicare Other | Admitting: Certified Registered Nurse Anesthetist

## 2014-03-30 ENCOUNTER — Ambulatory Visit (HOSPITAL_COMMUNITY)
Admission: RE | Admit: 2014-03-30 | Discharge: 2014-04-02 | Disposition: A | Discharge status: Skilled Nursing Facility | Payer: Medicare Other | Source: Ambulatory Visit | Attending: Orthopedic Surgery | Admitting: Orthopedic Surgery

## 2014-03-30 ENCOUNTER — Encounter (HOSPITAL_COMMUNITY): Payer: Medicare Other | Admitting: Certified Registered Nurse Anesthetist

## 2014-03-30 ENCOUNTER — Encounter (HOSPITAL_COMMUNITY): Payer: Self-pay | Admitting: *Deleted

## 2014-03-30 ENCOUNTER — Encounter (HOSPITAL_COMMUNITY)
Admission: RE | Disposition: A | Discharge status: Skilled Nursing Facility | Payer: Self-pay | Source: Ambulatory Visit | Attending: Orthopedic Surgery

## 2014-03-30 DIAGNOSIS — Z981 Arthrodesis status: Secondary | ICD-10-CM | POA: Diagnosis not present

## 2014-03-30 DIAGNOSIS — Z9849 Cataract extraction status, unspecified eye: Secondary | ICD-10-CM | POA: Diagnosis not present

## 2014-03-30 DIAGNOSIS — Z881 Allergy status to other antibiotic agents status: Secondary | ICD-10-CM | POA: Diagnosis not present

## 2014-03-30 DIAGNOSIS — F329 Major depressive disorder, single episode, unspecified: Secondary | ICD-10-CM | POA: Diagnosis not present

## 2014-03-30 DIAGNOSIS — I10 Essential (primary) hypertension: Secondary | ICD-10-CM | POA: Insufficient documentation

## 2014-03-30 DIAGNOSIS — Z961 Presence of intraocular lens: Secondary | ICD-10-CM | POA: Diagnosis not present

## 2014-03-30 DIAGNOSIS — I739 Peripheral vascular disease, unspecified: Secondary | ICD-10-CM | POA: Diagnosis not present

## 2014-03-30 DIAGNOSIS — M86171 Other acute osteomyelitis, right ankle and foot: Secondary | ICD-10-CM | POA: Diagnosis present

## 2014-03-30 DIAGNOSIS — Z7982 Long term (current) use of aspirin: Secondary | ICD-10-CM | POA: Insufficient documentation

## 2014-03-30 DIAGNOSIS — N393 Stress incontinence (female) (male): Secondary | ICD-10-CM | POA: Insufficient documentation

## 2014-03-30 DIAGNOSIS — Z882 Allergy status to sulfonamides status: Secondary | ICD-10-CM | POA: Diagnosis not present

## 2014-03-30 DIAGNOSIS — F3289 Other specified depressive episodes: Secondary | ICD-10-CM | POA: Diagnosis not present

## 2014-03-30 DIAGNOSIS — Z8261 Family history of arthritis: Secondary | ICD-10-CM | POA: Insufficient documentation

## 2014-03-30 DIAGNOSIS — G609 Hereditary and idiopathic neuropathy, unspecified: Secondary | ICD-10-CM | POA: Insufficient documentation

## 2014-03-30 DIAGNOSIS — M1289 Other specific arthropathies, not elsewhere classified, multiple sites: Secondary | ICD-10-CM | POA: Diagnosis not present

## 2014-03-30 DIAGNOSIS — E1159 Type 2 diabetes mellitus with other circulatory complications: Secondary | ICD-10-CM | POA: Diagnosis not present

## 2014-03-30 DIAGNOSIS — K219 Gastro-esophageal reflux disease without esophagitis: Secondary | ICD-10-CM | POA: Diagnosis not present

## 2014-03-30 DIAGNOSIS — Z888 Allergy status to other drugs, medicaments and biological substances status: Secondary | ICD-10-CM | POA: Diagnosis not present

## 2014-03-30 DIAGNOSIS — Z90710 Acquired absence of both cervix and uterus: Secondary | ICD-10-CM | POA: Insufficient documentation

## 2014-03-30 DIAGNOSIS — Z79899 Other long term (current) drug therapy: Secondary | ICD-10-CM | POA: Diagnosis not present

## 2014-03-30 DIAGNOSIS — Z9089 Acquired absence of other organs: Secondary | ICD-10-CM | POA: Diagnosis not present

## 2014-03-30 DIAGNOSIS — M869 Osteomyelitis, unspecified: Secondary | ICD-10-CM | POA: Diagnosis present

## 2014-03-30 DIAGNOSIS — R32 Unspecified urinary incontinence: Secondary | ICD-10-CM | POA: Diagnosis not present

## 2014-03-30 HISTORY — DX: Pneumonia, unspecified organism: J18.9

## 2014-03-30 HISTORY — PX: AMPUTATION: SHX166

## 2014-03-30 LAB — CBC
HEMATOCRIT: 38.2 % (ref 36.0–46.0)
HEMOGLOBIN: 12.4 g/dL (ref 12.0–15.0)
MCH: 28.6 pg (ref 26.0–34.0)
MCHC: 32.5 g/dL (ref 30.0–36.0)
MCV: 88 fL (ref 78.0–100.0)
Platelets: 273 10*3/uL (ref 150–400)
RBC: 4.34 MIL/uL (ref 3.87–5.11)
RDW: 13 % (ref 11.5–15.5)
WBC: 7.8 10*3/uL (ref 4.0–10.5)

## 2014-03-30 LAB — COMPREHENSIVE METABOLIC PANEL
ALK PHOS: 90 U/L (ref 39–117)
ALT: 12 U/L (ref 0–35)
ANION GAP: 12 (ref 5–15)
AST: 21 U/L (ref 0–37)
Albumin: 3.4 g/dL — ABNORMAL LOW (ref 3.5–5.2)
BUN: 16 mg/dL (ref 6–23)
CALCIUM: 9.4 mg/dL (ref 8.4–10.5)
CO2: 30 mEq/L (ref 19–32)
Chloride: 99 mEq/L (ref 96–112)
Creatinine, Ser: 0.63 mg/dL (ref 0.50–1.10)
GFR calc non Af Amer: 84 mL/min — ABNORMAL LOW (ref >90)
GLUCOSE: 86 mg/dL (ref 70–99)
POTASSIUM: 4 meq/L (ref 3.7–5.3)
Sodium: 141 mEq/L (ref 137–147)
Total Bilirubin: 0.3 mg/dL (ref 0.3–1.2)
Total Protein: 6.7 g/dL (ref 6.0–8.3)

## 2014-03-30 LAB — GLUCOSE, CAPILLARY
GLUCOSE-CAPILLARY: 62 mg/dL — AB (ref 70–99)
GLUCOSE-CAPILLARY: 125 mg/dL — AB (ref 70–99)
Glucose-Capillary: 75 mg/dL (ref 70–99)
Glucose-Capillary: 77 mg/dL (ref 70–99)
Glucose-Capillary: 166 mg/dL — ABNORMAL HIGH (ref 70–99)

## 2014-03-30 LAB — PROTIME-INR
INR: 1.03 (ref 0.00–1.49)
Prothrombin Time: 13.5 seconds (ref 11.6–15.2)

## 2014-03-30 LAB — APTT: aPTT: 33 seconds (ref 24–37)

## 2014-03-30 SURGERY — AMPUTATION DIGIT
Anesthesia: Monitor Anesthesia Care | Site: Foot | Laterality: Right

## 2014-03-30 MED ORDER — METHYLCELLULOSE 1 % OP SOLN: 1.0000 [drp] | Freq: Every day | OPHTHALMIC | Status: DC | PRN

## 2014-03-30 MED ORDER — TRIAMTERENE-HCTZ 75-50 MG PO TABS
1.0000 | ORAL_TABLET | Freq: Every day | ORAL | Status: DC
Start: 2014-03-30 — End: 2014-04-02
  Administered 2014-03-31 – 2014-04-02 (×3): 1 via ORAL
  Filled 2014-03-30 (×4): qty 1

## 2014-03-30 MED ORDER — METHOCARBAMOL 500 MG PO TABS
500.0000 mg | ORAL_TABLET | Freq: Four times a day (QID) | ORAL | Status: DC | PRN
  Administered 2014-03-30 – 2014-04-02 (×5): 500 mg via ORAL
  Filled 2014-03-30 (×5): qty 1

## 2014-03-30 MED ORDER — PANTOPRAZOLE SODIUM 40 MG PO TBEC
40.0000 mg | DELAYED_RELEASE_TABLET | Freq: Every day | ORAL | Status: DC
  Administered 2014-03-31 – 2014-04-02 (×3): 40 mg via ORAL
  Filled 2014-03-30 (×3): qty 1

## 2014-03-30 MED ORDER — POTASSIUM CHLORIDE CRYS ER 20 MEQ PO TBCR
20.0000 meq | EXTENDED_RELEASE_TABLET | Freq: Every morning | ORAL | Status: DC
  Administered 2014-03-31 – 2014-04-02 (×3): 20 meq via ORAL
  Filled 2014-03-30 (×3): qty 1

## 2014-03-30 MED ORDER — PROPOFOL 10 MG/ML IV BOLUS
INTRAVENOUS | Status: AC
  Filled 2014-03-30: qty 20

## 2014-03-30 MED ORDER — DULOXETINE HCL 30 MG PO CPEP
30.0000 mg | ORAL_CAPSULE | Freq: Every morning | ORAL | Status: DC
  Administered 2014-03-31 – 2014-04-02 (×3): 30 mg via ORAL
  Filled 2014-03-30 (×3): qty 1

## 2014-03-30 MED ORDER — ONDANSETRON HCL 4 MG/2ML IJ SOLN: 4.0000 mg | Freq: Four times a day (QID) | INTRAMUSCULAR | Status: DC | PRN

## 2014-03-30 MED ORDER — ONDANSETRON HCL 4 MG PO TABS
4.0000 mg | ORAL_TABLET | Freq: Four times a day (QID) | ORAL | Status: DC | PRN
  Administered 2014-03-31 – 2014-04-01 (×2): 4 mg via ORAL
  Filled 2014-03-30 (×2): qty 1

## 2014-03-30 MED ORDER — PROPOFOL 10 MG/ML IV BOLUS
INTRAVENOUS | Status: DC | PRN
  Administered 2014-03-30: 10 mg via INTRAVENOUS
  Administered 2014-03-30: 20 mg via INTRAVENOUS

## 2014-03-30 MED ORDER — INSULIN ASPART 100 UNIT/ML ~~LOC~~ SOLN
0.0000 [IU] | Freq: Three times a day (TID) | SUBCUTANEOUS | Status: DC
  Administered 2014-03-31: 3 [IU] via SUBCUTANEOUS
  Administered 2014-03-31: 1 [IU] via SUBCUTANEOUS
  Administered 2014-04-01: 5 [IU] via SUBCUTANEOUS
  Administered 2014-04-01: 1 [IU] via SUBCUTANEOUS
  Administered 2014-04-01: 3 [IU] via SUBCUTANEOUS
  Administered 2014-04-02: 2 [IU] via SUBCUTANEOUS
  Administered 2014-04-02: 1 [IU] via SUBCUTANEOUS

## 2014-03-30 MED ORDER — DOCUSATE SODIUM 100 MG PO CAPS
100.0000 mg | ORAL_CAPSULE | Freq: Two times a day (BID) | ORAL | Status: DC
  Administered 2014-03-30 – 2014-04-02 (×6): 100 mg via ORAL
  Filled 2014-03-30 (×9): qty 1

## 2014-03-30 MED ORDER — ASPIRIN EC 81 MG PO TBEC
81.0000 mg | DELAYED_RELEASE_TABLET | Freq: Every morning | ORAL | Status: DC
  Administered 2014-03-31 – 2014-04-02 (×3): 81 mg via ORAL
  Filled 2014-03-30 (×3): qty 1

## 2014-03-30 MED ORDER — SODIUM CHLORIDE 0.9 % IV SOLN: INTRAVENOUS | Status: DC

## 2014-03-30 MED ORDER — LACTATED RINGERS IV SOLN
INTRAVENOUS | Status: DC | PRN
  Administered 2014-03-30: 14:00:00 via INTRAVENOUS

## 2014-03-30 MED ORDER — FENTANYL CITRATE 0.05 MG/ML IJ SOLN
INTRAMUSCULAR | Status: AC
  Filled 2014-03-30: qty 5

## 2014-03-30 MED ORDER — OLOPATADINE HCL 0.1 % OP SOLN
1.0000 [drp] | Freq: Two times a day (BID) | OPHTHALMIC | Status: DC | PRN
  Filled 2014-03-30: qty 5

## 2014-03-30 MED ORDER — GLUCERNA SHAKE PO LIQD
237.0000 mL | Freq: Three times a day (TID) | ORAL | Status: DC
  Administered 2014-03-30 – 2014-04-02 (×4): 237 mL via ORAL

## 2014-03-30 MED ORDER — INSULIN GLARGINE 100 UNIT/ML ~~LOC~~ SOLN
16.0000 [IU] | Freq: Every day | SUBCUTANEOUS | Status: DC
  Administered 2014-03-31 – 2014-04-01 (×2): 16 [IU] via SUBCUTANEOUS
  Filled 2014-03-30 (×5): qty 0.16

## 2014-03-30 MED ORDER — 0.9 % SODIUM CHLORIDE (POUR BTL) OPTIME
TOPICAL | Status: DC | PRN
  Administered 2014-03-30: 1000 mL

## 2014-03-30 MED ORDER — METOCLOPRAMIDE HCL 5 MG/ML IJ SOLN: 5.0000 mg | Freq: Three times a day (TID) | INTRAMUSCULAR | Status: DC | PRN

## 2014-03-30 MED ORDER — OXYCODONE-ACETAMINOPHEN 5-325 MG PO TABS
1.0000 | ORAL_TABLET | ORAL | Status: DC | PRN
  Administered 2014-03-31 – 2014-04-02 (×5): 2 via ORAL
  Filled 2014-03-30 (×5): qty 2

## 2014-03-30 MED ORDER — SENNOSIDES-DOCUSATE SODIUM 8.6-50 MG PO TABS
2.0000 | ORAL_TABLET | Freq: Every day | ORAL | Status: DC
  Administered 2014-03-30 – 2014-04-01 (×3): 2 via ORAL
  Filled 2014-03-30 (×2): qty 2

## 2014-03-30 MED ORDER — CEFAZOLIN SODIUM 1-5 GM-% IV SOLN
1.0000 g | Freq: Four times a day (QID) | INTRAVENOUS | Status: AC
  Administered 2014-03-30 – 2014-03-31 (×3): 1 g via INTRAVENOUS
  Filled 2014-03-30 (×3): qty 50

## 2014-03-30 MED ORDER — HYDROMORPHONE HCL PF 1 MG/ML IJ SOLN
0.5000 mg | INTRAMUSCULAR | Status: DC | PRN
  Administered 2014-03-30 – 2014-04-01 (×4): 1 mg via INTRAVENOUS
  Filled 2014-03-30 (×4): qty 1

## 2014-03-30 MED ORDER — POLYETHYLENE GLYCOL 3350 17 G PO PACK: 17.0000 g | PACK | Freq: Every day | ORAL | Status: DC | PRN

## 2014-03-30 MED ORDER — FENTANYL CITRATE 0.05 MG/ML IJ SOLN
INTRAMUSCULAR | Status: AC
  Administered 2014-03-30: 50 ug
  Filled 2014-03-30: qty 2

## 2014-03-30 MED ORDER — INSULIN ASPART 100 UNIT/ML ~~LOC~~ SOLN
3.0000 [IU] | Freq: Three times a day (TID) | SUBCUTANEOUS | Status: DC
  Administered 2014-03-31 – 2014-04-02 (×8): 3 [IU] via SUBCUTANEOUS

## 2014-03-30 MED ORDER — MORPHINE SULFATE ER 15 MG PO TBCR
15.0000 mg | EXTENDED_RELEASE_TABLET | Freq: Two times a day (BID) | ORAL | Status: DC
  Administered 2014-03-30 – 2014-04-02 (×6): 15 mg via ORAL
  Filled 2014-03-30 (×7): qty 1

## 2014-03-30 MED ORDER — LORAZEPAM 0.5 MG PO TABS
0.5000 mg | ORAL_TABLET | Freq: Two times a day (BID) | ORAL | Status: DC
  Administered 2014-03-31 – 2014-04-02 (×5): 0.5 mg via ORAL
  Filled 2014-03-30 (×5): qty 1

## 2014-03-30 MED ORDER — METOCLOPRAMIDE HCL 10 MG PO TABS
5.0000 mg | ORAL_TABLET | Freq: Three times a day (TID) | ORAL | Status: DC | PRN
  Administered 2014-04-01: 10 mg via ORAL
  Filled 2014-03-30: qty 1

## 2014-03-30 MED ORDER — LACTATED RINGERS IV SOLN
INTRAVENOUS | Status: DC
  Administered 2014-03-30: 13:00:00 via INTRAVENOUS

## 2014-03-30 MED ORDER — METHOCARBAMOL 1000 MG/10ML IJ SOLN
500.0000 mg | Freq: Four times a day (QID) | INTRAMUSCULAR | Status: DC | PRN
  Filled 2014-03-30: qty 5

## 2014-03-30 MED ORDER — GLUCERNA PO LIQD: 237.0000 mL | Freq: Three times a day (TID) | ORAL | Status: DC

## 2014-03-30 MED ORDER — POLYVINYL ALCOHOL 1.4 % OP SOLN: 1.0000 [drp] | OPHTHALMIC | Status: DC | PRN

## 2014-03-30 SURGICAL SUPPLY — 37 items
BNDG CMPR 9X4 STRL LF SNTH (GAUZE/BANDAGES/DRESSINGS)
BNDG COHESIVE 4X5 TAN STRL (GAUZE/BANDAGES/DRESSINGS) ×3 IMPLANT
BNDG ESMARK 4X9 LF (GAUZE/BANDAGES/DRESSINGS) IMPLANT
BNDG GAUZE ELAST 4 BULKY (GAUZE/BANDAGES/DRESSINGS) ×3 IMPLANT
COVER SURGICAL LIGHT HANDLE (MISCELLANEOUS) ×3 IMPLANT
DRAPE U-SHAPE 47X51 STRL (DRAPES) ×3 IMPLANT
DRSG ADAPTIC 3X8 NADH LF (GAUZE/BANDAGES/DRESSINGS) ×2 IMPLANT
DRSG PAD ABDOMINAL 8X10 ST (GAUZE/BANDAGES/DRESSINGS) ×3 IMPLANT
DURAPREP 26ML APPLICATOR (WOUND CARE) ×3 IMPLANT
ELECT REM PT RETURN 9FT ADLT (ELECTROSURGICAL) ×3
ELECTRODE REM PT RTRN 9FT ADLT (ELECTROSURGICAL) ×1 IMPLANT
GAUZE SPONGE 4X4 12PLY STRL (GAUZE/BANDAGES/DRESSINGS) ×2 IMPLANT
GLOVE BIO SURGEON STRL SZ 6 (GLOVE) ×2 IMPLANT
GLOVE BIOGEL PI IND STRL 6.5 (GLOVE) IMPLANT
GLOVE BIOGEL PI IND STRL 7.5 (GLOVE) IMPLANT
GLOVE BIOGEL PI IND STRL 9 (GLOVE) ×1 IMPLANT
GLOVE BIOGEL PI INDICATOR 6.5 (GLOVE) ×2
GLOVE BIOGEL PI INDICATOR 7.5 (GLOVE) ×2
GLOVE BIOGEL PI INDICATOR 9 (GLOVE) ×2
GLOVE SURG ORTHO 9.0 STRL STRW (GLOVE) ×3 IMPLANT
GLOVE SURG SS PI 6.5 STRL IVOR (GLOVE) ×2 IMPLANT
GOWN STRL REUS W/ TWL XL LVL3 (GOWN DISPOSABLE) ×2 IMPLANT
GOWN STRL REUS W/TWL XL LVL3 (GOWN DISPOSABLE) ×6
KIT BASIN OR (CUSTOM PROCEDURE TRAY) ×3 IMPLANT
KIT ROOM TURNOVER OR (KITS) ×3 IMPLANT
MANIFOLD NEPTUNE II (INSTRUMENTS) ×3 IMPLANT
NEEDLE 22X1 1/2 (OR ONLY) (NEEDLE) IMPLANT
NS IRRIG 1000ML POUR BTL (IV SOLUTION) ×3 IMPLANT
PACK ORTHO EXTREMITY (CUSTOM PROCEDURE TRAY) ×3 IMPLANT
PAD ABD 8X10 STRL (GAUZE/BANDAGES/DRESSINGS) ×2 IMPLANT
PAD ARMBOARD 7.5X6 YLW CONV (MISCELLANEOUS) ×6 IMPLANT
SPONGE GAUZE 4X4 12PLY STER LF (GAUZE/BANDAGES/DRESSINGS) ×2 IMPLANT
SUCTION FRAZIER TIP 10 FR DISP (SUCTIONS) IMPLANT
SUT ETHILON 2 0 PSLX (SUTURE) ×5 IMPLANT
SYR CONTROL 10ML LL (SYRINGE) IMPLANT
TOWEL OR 17X24 6PK STRL BLUE (TOWEL DISPOSABLE) ×3 IMPLANT
TOWEL OR 17X26 10 PK STRL BLUE (TOWEL DISPOSABLE) ×3 IMPLANT

## 2014-03-30 NOTE — Progress Notes (Signed)
Orthopedic Tech Progress Note Patient Details:  Len ChildsBertha L Haupt 08/12/1932 [id number]  Ortho Devices Type of Ortho Device: Postop shoe/boot Ortho Device/Splint Location: rle Ortho Device/Splint Interventions: Application   Roneshia Drew 03/30/2014, 8:17 PM

## 2014-03-30 NOTE — Discharge Instructions (Signed)
Change dressing as needed right foot. Postoperative shoe, minimize weightbearing, right lower extremity. Keep foot elevated above the heart at all times.

## 2014-03-30 NOTE — Anesthesia Procedure Notes (Signed)
Anesthesia Regional Block:  Ankle block  Pre-Anesthetic Checklist: ,, timeout performed, Correct Patient, Correct Site, Correct Laterality, Correct Procedure, Correct Position, site marked, Risks and benefits discussed,  Surgical consent,  Pre-op evaluation,  At surgeon's request and post-op pain management  Laterality: Right  Prep: Maximum Sterile Barrier Precautions used, chloraprep and alcohol swabs       Needles:  Injection technique: Single-shot      Needle Gauge: 25 and 25 G    Additional Needles: Ankle block Narrative:  Start time: 03/30/2014 2:00 PM End time: 03/30/2014 2:05 PM Injection made incrementally with aspirations every 5 mL.  Performed by: Personally  Anesthesiologist: Maren BeachGregory E Nathanal Hermiz MD  Additional Notes: Pt accepts procedure w/ risks. 25cc( 12cc 2% Lidocaine and 13 cc 0.5% Marcaine w/o epi ) Ant and Poat tibial nerves. GES

## 2014-03-30 NOTE — Anesthesia Postprocedure Evaluation (Signed)
  Anesthesia Post-op Note  Patient: Len ChildsBertha L Rotenberry  Procedure(s) Performed: Procedure(s): Right Great Toe Amputation at MTP Joint (Right)  Patient Location: PACU  Anesthesia Type:MAC and Regional  Level of Consciousness: awake, alert , oriented and patient cooperative  Airway and Oxygen Therapy: Patient Spontanous Breathing  Post-op Pain: none  Post-op Assessment: Post-op Vital signs reviewed, Patient's Cardiovascular Status Stable, Respiratory Function Stable, Patent Airway and No signs of Nausea or vomiting  Post-op Vital Signs: stable  Last Vitals:  Filed Vitals:   03/30/14 1500  BP: 146/62  Pulse: 85  Temp:   Resp: 25    Complications: No apparent anesthesia complications

## 2014-03-30 NOTE — Progress Notes (Signed)
23 hour observation planned for discharge back to assisted living on Saturday morning. Patient requires no prescriptions.

## 2014-03-30 NOTE — H&P (Signed)
Tonya ChildsBertha L [name] is an 80 y.o. female.   Chief Complaint: Osteomyelitis ulceration right great toe HPI: Patient is a 80 year old gentleman with peripheral vascular disease and diabetes who has failed conservative wound care for ulceration right great toe  Past Medical History  Diagnosis Date  . Incontinence of urine   . Diarrhea   . Neuropathy     bilateral feet  . GERD (gastroesophageal reflux disease)   . Depression   . Urinary, incontinence, stress female   . History of shingles 2009  . Degenerative lumbar spinal stenosis   . Type II diabetes mellitus     Type 2 IDDM  X 40 years;   . Arthritis     lower back/hands  . Altered mental status 01/13/2013  . Hypertension   . Pneumonia     Past Surgical History  Procedure Laterality Date  . Incontinence surgery  2005  . Tonsillectomy  1964  . Abdominal hysterectomy  1982  . Ovarian cyst surgery  1972  . Cholecystectomy  1988  . Cystoscopy  2009    macroplastique inj  . Rectocele repair  10/15/2011    Procedure: POSTERIOR REPAIR (RECTOCELE);  Surgeon: Kathi LudwigSigmund I Tannenbaum, MD;  [facility];  Service: Urology;;  posterior vaginal vault repair with sacral spinus repair with graft  . Cystoscopy  10/15/2011    Procedure: CYSTOSCOPY;  Surgeon: Kathi LudwigSigmund I Tannenbaum, MD;  [facility];  Service: Urology;  Laterality: N/A;  . Flexible sigmoidoscopy  02/28/2012    Procedure: FLEXIBLE SIGMOIDOSCOPY;  Surgeon: Willis ModenaWilliam Outlaw, MD;  Location: [facility];  Service: Endoscopy;  Laterality: N/A;  . Breast biopsy  1990    bilaterally  . Cataract extraction w/ intraocular lens implant    . Posterior fusion lumbar spine  05/09/2012    Family History  Problem Relation Age of Onset  . Osteoarthritis Sister   . Osteoarthritis Brother    Social History:  reports that she has never smoked. She has never used smokeless tobacco. She reports that she does not drink alcohol or use illicit drugs.  Allergies:   Allergies  Allergen Reactions  . Ciprofloxacin Other (See Comments)    Leg and feet numbness  . Lidoderm [Lidocaine] Itching, Rash and Other (See Comments)    Burning sensations also  . Ace Inhibitors Other (See Comments)    Poorly tolerated-per PCP  . Angiotensin Receptor Blockers Other (See Comments)    Poorly tolerated-per PCP  . Glucotrol [Glipizide] Nausea Only and Other (See Comments)    Headaches also  . Triple Antibiotic [Bacitracin-Neomycin-Polymyxin] Other (See Comments)    Poorly tolerated  . Neosporin [Neomycin-Bacitracin Zn-Polymyx] Rash  . Sulfa Antibiotics Rash  . Zyrtec [Cetirizine] Itching and Rash    No prescriptions prior to admission    No results found for this or any previous visit (from the past 48 hour(s)). No results found.  Review of Systems  All other systems reviewed and are negative.   There were no vitals taken for this visit. Physical Exam  On examination patient has destructive bony changes of the great toe with ulceration which extends down to the bone. Assessment/Plan Assessment: Osteomyelitis ulceration right great toe.  Plan: We'll plan for amputation of the right great toe through the MTP joint. Risks and benefits were discussed including the risk of the wound not healing. Patient states he understands and wished to proceed at this time.  Viktoria Gruetzmacher V 03/30/2014, 6:24 AM

## 2014-03-30 NOTE — Anesthesia Preprocedure Evaluation (Signed)
Anesthesia Evaluation  Patient identified by MRN, date of birth, ID band Patient awake    Reviewed: Allergy & Precautions, H&P , NPO status , Patient's Chart, lab work & pertinent test results  Airway       Dental   Pulmonary          Cardiovascular hypertension, + Peripheral Vascular Disease     Neuro/Psych Depression  Neuromuscular disease    GI/Hepatic GERD-  ,  Endo/Other  diabetes, Type 1, Insulin Dependent, Oral Hypoglycemic Agents  Renal/GU      Musculoskeletal   Abdominal   Peds  Hematology   Anesthesia Other Findings   Reproductive/Obstetrics                           Anesthesia Physical Anesthesia Plan  ASA: III  Anesthesia Plan: Regional and MAC   Post-op Pain Management:    Induction: Intravenous  Airway Management Planned: Mask  Additional Equipment:   Intra-op Plan:   Post-operative Plan:   Informed Consent: I have reviewed the patients History and Physical, chart, labs and discussed the procedure including the risks, benefits and alternatives for the proposed anesthesia with the patient or authorized representative who has indicated his/her understanding and acceptance.     Plan Discussed with: CRNA, Anesthesiologist and Surgeon  Anesthesia Plan Comments:         Anesthesia Quick Evaluation

## 2014-03-30 NOTE — Op Note (Signed)
03/30/2014  5:13 PM  PATIENT:  [name]    PRE-OPERATIVE DIAGNOSIS:  Osteomyelitis Right Great Toe  POST-OPERATIVE DIAGNOSIS:  Same  PROCEDURE:  Right Great Toe Amputation at MTP Joint  SURGEON:  Nadara MustardUDA,Jamarkus Lisbon V, MD  PHYSICIAN ASSISTANT:None ANESTHESIA:   General  PREOPERATIVE INDICATIONS:  [name] is a  80 y.o. female with a diagnosis of Osteomyelitis Right Great Toe who failed conservative measures and elected for surgical management.    The risks benefits and alternatives were discussed with the patient preoperatively including but not limited to the risks of infection, bleeding, nerve injury, cardiopulmonary complications, the need for revision surgery, among others, and the patient was willing to proceed.  OPERATIVE IMPLANTS: None  OPERATIVE FINDINGS: Good petechial bleeding  OPERATIVE PROCEDURE: Patient is a 80 year old woman with ulceration and osteomyelitis of the right great toe. She has failed conservative therapy and presents at this time for amputation of the great toe through the MTP joint. Risks and benefits were discussed including risk of the wound not healing. Patient states she understands and wished to proceed at this time. Description of procedure patient was brought to the operating room and underwent a general anesthetic. After adequate levels and anesthesia obtained patient's right lower extremity was prepped using DuraPrep draped in the sterile field. A timeout was called. A fishmouth incision was made around the great toe distal to the MTP joint. The toe was amputated through the MTP joint. Hemostasis was obtained the wound was irrigated with normal saline the skin was closed using 2-0 nylon. A sterile compressive dressing was applied. Patient was extubated taken to the PACU in stable condition.

## 2014-03-30 NOTE — Transfer of Care (Signed)
Immediate Anesthesia Transfer of Care Note  Patient: [name]  Procedure(s) Performed: Procedure(s): Right Great Toe Amputation at MTP Joint (Right)  Patient Location: PACU  Anesthesia Type:MAC combined with regional for post-op pain  Level of Consciousness: awake, alert  and oriented  Airway & Oxygen Therapy: Patient Spontanous Breathing  Post-op Assessment: Report given to PACU RN and Post -op Vital signs reviewed and stable  Post vital signs: Reviewed and stable  Complications: No apparent anesthesia complications

## 2014-03-31 DIAGNOSIS — M869 Osteomyelitis, unspecified: Secondary | ICD-10-CM | POA: Diagnosis not present

## 2014-03-31 LAB — GLUCOSE, CAPILLARY
GLUCOSE-CAPILLARY: 128 mg/dL — AB (ref 70–99)
GLUCOSE-CAPILLARY: 205 mg/dL — AB (ref 70–99)
Glucose-Capillary: 106 mg/dL — ABNORMAL HIGH (ref 70–99)
Glucose-Capillary: 250 mg/dL — ABNORMAL HIGH (ref 70–99)

## 2014-03-31 NOTE — Progress Notes (Signed)
Orthopedic Tech Progress Note Patient Details:  [name] 03-05-1933 [id number]  Ortho Devices Type of Ortho Device: Darco shoe Ortho Device/Splint Location: rle Ortho Device/Splint Interventions: Application   Cammer, Mickie Bail 03/31/2014, 2:28 PM

## 2014-03-31 NOTE — Progress Notes (Signed)
Subjective: Pt stable but very slow to mobilize   Objective: Vital signs in last 24 hours: Temp:  [97.5 F (36.4 C)-98.6 F (37 C)] 97.6 F (36.4 C) (08/22 0423) Pulse Rate:  [63-90] 90 (08/22 0423) Resp:  [13-25] 14 (08/22 0423) BP: (109-160)/(42-76) 130/76 mmHg (08/22 0423) SpO2:  [95 %-100 %] 95 % (08/22 0423) Weight:  [68.04 kg (150 lb)] 68.04 kg (150 lb) (08/21 1131)  Intake/Output from previous day: 08/21 0701 - 08/22 0700 In: 1000 [P.O.:360; I.V.:640] Out: 50 [Blood:50] Intake/Output this shift:    Exam:  dressing ok  Labs:  Recent Labs  03/30/14 1201  HGB 12.4    Recent Labs  03/30/14 1201  WBC 7.8  RBC 4.34  HCT 38.2  PLT 273    Recent Labs  03/30/14 1201  NA 141  K 4.0  CL 99  CO2 30  BUN 16  CREATININE 0.63  GLUCOSE 86  CALCIUM 9.4    Recent Labs  03/30/14 1201  INR 1.03    Assessment/Plan: Plan PT today - will likely need to stay tonight and dc am   Cathaleen Korol SCOTT 03/31/2014, 9:29 AM

## 2014-03-31 NOTE — Evaluation (Signed)
Physical Therapy Evaluation Patient Details Name: [name] MRN: [id number] DOB: 08/30/1932 Today's Date: 03/31/2014   History of Present Illness  Patient admitted for elective amputation of Right great toe amputation, with history of depression, DM, incontinence  Clinical Impression  Patient limited in mobility by pain and anxiety.  Patient very tearful about requiring more assistance at Assisted Living.  Feel patient will have difficulty maintaining TDWB and may need w/c for transport.  May benefit from [employer] Shoe and possible weight bearing through heel if MD agrees.  Will need follow up PT at ALF to progress mobility.     Follow Up Recommendations Home health PT (at assisted living)    Equipment Recommendations  None recommended by PT    Recommendations for Other Services       Precautions / Restrictions Precautions Precautions: Fall Restrictions RLE Weight Bearing: Touchdown weight bearing      Mobility  Bed Mobility Overal bed mobility: Needs Assistance Bed Mobility: Supine to Sit     Supine to sit: Min guard     General bed mobility comments: used rail, required extra time  Transfers Overall transfer level: Needs assistance Equipment used: Rolling walker (2 wheeled) Transfers: Sit to/from Stand Sit to Stand: Min assist         General transfer comment: assist to power up, verbal cues to maintain TDWB  Ambulation/Gait Ambulation/Gait assistance: Min assist Ambulation Distance (Feet): 3 Feet Assistive device: Rolling walker (2 wheeled) Gait Pattern/deviations: Step-to pattern;Decreased stride length;Trunk flexed Gait velocity: decreased      Stairs            Wheelchair Mobility    Modified Rankin (Stroke Patients Only)       Balance Overall balance assessment: Needs assistance Sitting-balance support: No upper extremity supported Sitting balance-Leahy Scale: Fair     Standing balance support: Bilateral upper extremity  supported Standing balance-Leahy Scale: Poor                               Pertinent Vitals/Pain Pain Score: 10-Worst pain ever Pain Location: right foot Pain Descriptors / Indicators: Crying Pain Intervention(s): Limited activity within patient's tolerance;Premedicated before session    Home Living Family/patient expects to be discharged to:: Assisted living ("[facility]")   Available Help at Discharge: Other (Comment) (assisted living) Type of Home: Assisted living Home Access: Level entry     Home Layout: One level Home Equipment: Grab bars - toilet;Grab bars - tub/shower;Shower seat;Walker - 4 wheels      Prior Function Level of Independence: Needs assistance   Gait / Transfers Assistance Needed: Mod I with a rolling walker  ADL's / Homemaking Assistance Needed: Needed assistance occasionally for bath/dress.        Hand Dominance   Dominant Hand: Right    Extremity/Trunk Assessment   Upper Extremity Assessment: Generalized weakness           Lower Extremity Assessment: Generalized weakness      Cervical / Trunk Assessment: Kyphotic  Communication   Communication: No difficulties  Cognition Arousal/Alertness: Awake/alert Behavior During Therapy: WFL for tasks assessed/performed (labile during session) Overall Cognitive Status: History of cognitive impairments - at baseline                      General Comments      Exercises        Assessment/Plan    PT Assessment Patient needs continued  PT services  PT Diagnosis Difficulty walking   PT Problem List Decreased activity tolerance;Decreased balance;Decreased mobility;Pain  PT Treatment Interventions DME instruction;Gait training;Functional mobility training;Therapeutic activities;Therapeutic exercise;Balance training;Patient/family education   PT Goals (Current goals can be found in the Care Plan section) Acute Rehab PT Goals Patient Stated Goal: None  stated PT Goal Formulation: With patient Time For Goal Achievement: 04/07/14 Potential to Achieve Goals: Good    Frequency Min 5X/week   Barriers to discharge        Co-evaluation               End of Session Equipment Utilized During Treatment: Gait belt Activity Tolerance: Patient limited by pain Patient left: in chair;with call bell/phone within reach;with family/visitor present      Functional Assessment Tool Used: clinical judgement Functional Limitation: Mobility: Walking and moving around Mobility: Walking and Moving Around Current Status [phone]): At least 20 percent but less than 40 percent impaired, limited or restricted Mobility: Walking and Moving Around Goal Status [phone]): At least 1 percent but less than 20 percent impaired, limited or restricted    Time: 0957-1030 PT Time Calculation (min): 33 min   Charges:   PT Evaluation $Initial PT Evaluation Tier I: 1 Procedure PT Treatments $Gait Training: 8-22 mins   PT G Codes:   Functional Assessment Tool Used: clinical judgement Functional Limitation: Mobility: Walking and moving around    Olivia Canter 03/31/2014, 10:40 AM

## 2014-04-01 DIAGNOSIS — M869 Osteomyelitis, unspecified: Secondary | ICD-10-CM | POA: Diagnosis not present

## 2014-04-01 LAB — GLUCOSE, CAPILLARY
GLUCOSE-CAPILLARY: 206 mg/dL — AB (ref 70–99)
Glucose-Capillary: 293 mg/dL — ABNORMAL HIGH (ref 70–99)
Glucose-Capillary: 140 mg/dL — ABNORMAL HIGH (ref 70–99)
Glucose-Capillary: 131 mg/dL — ABNORMAL HIGH (ref 70–99)

## 2014-04-01 NOTE — Progress Notes (Signed)
Subjective: Pt stable - very slow progress in PT - has assisted living at residence   Objective: Vital signs in last 24 hours: Temp:  [98.1 F (36.7 C)-98.3 F (36.8 C)] 98.1 F (36.7 C) (08/23 0704) Pulse Rate:  [66-87] 66 (08/23 0704) Resp:  [16] 16 (08/23 0704) BP: (133-147)/(48-56) 141/48 mmHg (08/23 0704) SpO2:  [94 %-99 %] 94 % (08/23 0704)  Intake/Output from previous day: 08/22 0701 - 08/23 0700 In: 480 [P.O.:480] Out: -  Intake/Output this shift:    Exam:  No cellulitis present  Labs:  Recent Labs  03/30/14 1201  HGB 12.4    Recent Labs  03/30/14 1201  WBC 7.8  RBC 4.34  HCT 38.2  PLT 273    Recent Labs  03/30/14 1201  NA 141  K 4.0  CL 99  CO2 30  BUN 16  CREATININE 0.63  GLUCOSE 86  CALCIUM 9.4    Recent Labs  03/30/14 1201  INR 1.03    Assessment/Plan: Plan for 1 more day of PT and dc am    Abygayle Deltoro SCOTT 04/01/2014, 9:08 AM

## 2014-04-01 NOTE — Progress Notes (Signed)
Ok to weight bear on heel in darco boot

## 2014-04-01 NOTE — Progress Notes (Signed)
Physical Therapy Treatment Patient Details Name: [name] MRN: [id number] DOB: [birth year] Today's Date: April 20, 2014    History of Present Illness Patient admitted for elective amputation of Right great toe amputation, with history of depression, DM, incontinence    PT Comments    Pt progressing well with mobility.  Follow Up Recommendations  Home health PT (at assisted living )     Equipment Recommendations  None recommended by PT    Recommendations for Other Services OT consult     Precautions / Restrictions Precautions Precautions: Fall Restrictions RLE Weight Bearing: Weight bearing as tolerated Other Position/Activity Restrictions: with Darco shoe through heel    Mobility  Bed Mobility Overal bed mobility: Needs Assistance       Supine to sit: Supervision     General bed mobility comments: increased time with use of rail and bed flat.  Transfers Overall transfer level: Needs assistance Equipment used: Rolling walker (2 wheeled) Transfers: Sit to/from Stand Sit to Stand: Min guard         General transfer comment: cues for hand placement, no physical assitance needed as pt now has Darco shoe on and can bear weight throught her right heel.  Ambulation/Gait Ambulation/Gait assistance: Min assist Ambulation Distance (Feet): 5 Feet Assistive device: Rolling walker (2 wheeled) Gait Pattern/deviations: Step-to pattern;Antalgic Gait velocity: decreased Gait velocity interpretation: Below normal speed for age/gender General Gait Details: cues on posture and walker use with gait/transfer. less assistance needed today with use of Darco shoe and weight bearing through heel.   Stairs            Wheelchair Mobility    Modified Rankin (Stroke Patients Only)          Cognition Arousal/Alertness: Awake/alert Behavior During Therapy: WFL for tasks assessed/performed Overall Cognitive Status: History of cognitive impairments - at baseline                              Pertinent Vitals/Pain Pain Assessment: 0-10 Pain Score: 8  Pain Descriptors / Indicators: Aching;Sore Pain Intervention(s): Limited activity within patient's tolerance;Monitored during session;Premedicated before session;Repositioned;Other (comment);Ice applied (elevated leg in chair with cryo)           PT Goals (current goals can now be found in the care plan section) Acute Rehab PT Goals Patient Stated Goal: None stated PT Goal Formulation: With patient Time For Goal Achievement: 04/07/14 Potential to Achieve Goals: Good Progress towards PT goals: Progressing toward goals    Frequency  Min 5X/week    PT Plan Current plan remains appropriate       End of Session Equipment Utilized During Treatment: Gait belt Activity Tolerance: Patient tolerated treatment well Patient left: in chair;with call bell/phone within reach     Time: 1119-1135 PT Time Calculation (min): 16 min  Charges:  $Therapeutic Activity: 8-22 mins                    G Codes:      Sallyanne Kuster  04/20/2014, 12:37 PM  Sallyanne Kuster, PTA Office- [phone]

## 2014-04-02 ENCOUNTER — Encounter (HOSPITAL_COMMUNITY): Payer: Self-pay | Admitting: Orthopedic Surgery

## 2014-04-02 DIAGNOSIS — M869 Osteomyelitis, unspecified: Secondary | ICD-10-CM | POA: Diagnosis not present

## 2014-04-02 LAB — GLUCOSE, CAPILLARY
GLUCOSE-CAPILLARY: 103 mg/dL — AB (ref 70–99)
GLUCOSE-CAPILLARY: 149 mg/dL — AB (ref 70–99)
Glucose-Capillary: 198 mg/dL — ABNORMAL HIGH (ref 70–99)

## 2014-04-02 MED ORDER — OXYCODONE-ACETAMINOPHEN 5-325 MG PO TABS: 1.0000 | ORAL_TABLET | ORAL | Status: DC | PRN

## 2014-04-02 NOTE — Discharge Summary (Signed)
Physician Discharge Summary  Patient ID: [name] MRN: [id number] DOB/AGE: September 17, 1932 80 y.o.  Admit date: 03/30/2014 Discharge date: 04/02/2014  Admission Diagnoses: Osteomyelitis right great toe  Discharge Diagnoses:  Active Problems:   Acute osteomyelitis of toe of right foot   Discharged Condition: stable  Hospital Course: Patient's hospital course was essentially unremarkable. She underwent amputation of the right great toe. Postoperatively patient progressed slowly with therapy and was discharge back to her assisted living.  Consults: None  Significant Diagnostic Studies: labs: Routine labs  Treatments: surgery: See operative note  Discharge Exam: Blood pressure 119/69, pulse 93, temperature 98 F (36.7 C), temperature source Oral, resp. rate 18, height  (1.626 m), weight 68.04 kg (150 lb), SpO2 99.00%. Incision/Wound: dressing clean dry and intact  Disposition: 01-Home or Self Care  Discharge Instructions   Call MD / Call 911    Complete by:  As directed   If you experience chest pain or shortness of breath, CALL 911 and be transported to the hospital emergency room.  If you develope a fever above 101 F, pus (white drainage) or increased drainage or redness at the wound, or calf pain, call your surgeon's office.     Call MD / Call 911    Complete by:  As directed   If you experience chest pain or shortness of breath, CALL 911 and be transported to the hospital emergency room.  If you develope a fever above 101 F, pus (white drainage) or increased drainage or redness at the wound, or calf pain, call your surgeon's office.     Call MD / Call 911    Complete by:  As directed   If you experience chest pain or shortness of breath, CALL 911 and be transported to the hospital emergency room.  If you develope a fever above 101 F, pus (white drainage) or increased drainage or redness at the wound, or calf pain, call your surgeon's office.     Constipation Prevention     Complete by:  As directed   Drink plenty of fluids.  Prune juice may be helpful.  You may use a stool softener, such as Colace (over the counter) 100 mg twice a day.  Use MiraLax (over the counter) for constipation as needed.     Constipation Prevention    Complete by:  As directed   Drink plenty of fluids.  Prune juice may be helpful.  You may use a stool softener, such as Colace (over the counter) 100 mg twice a day.  Use MiraLax (over the counter) for constipation as needed.     Constipation Prevention    Complete by:  As directed   Drink plenty of fluids.  Prune juice may be helpful.  You may use a stool softener, such as Colace (over the counter) 100 mg twice a day.  Use MiraLax (over the counter) for constipation as needed.     Diet - low sodium heart healthy    Complete by:  As directed      Diet - low sodium heart healthy    Complete by:  As directed      Diet - low sodium heart healthy    Complete by:  As directed      Increase activity slowly as tolerated    Complete by:  As directed      Increase activity slowly as tolerated    Complete by:  As directed      Increase activity slowly as tolerated  Complete by:  As directed             Medication List         acetaminophen 325 MG tablet  Commonly known as:  TYLENOL  Take 2 tablets (650 mg total) by mouth every 6 (six) hours as needed for mild pain or fever.     amiloride-hydrochlorothiazide 5-50 MG tablet  Commonly known as:  MODURETIC  Take 0.5 tablets by mouth every morning.     amoxicillin-clavulanate 875-125 MG per tablet  Commonly known as:  AUGMENTIN  Take 1 tablet by mouth 2 (two) times daily.     aspirin EC 81 MG tablet  Take 81 mg by mouth every morning.     calcium citrate-vitamin D 315-200 MG-UNIT per tablet  Commonly known as:  CITRACAL+D  Take 2 tablets by mouth every morning.     cholecalciferol 1000 UNITS tablet  Commonly known as:  VITAMIN D  Take 1,000 Units by mouth every morning.      conjugated estrogens vaginal cream  Commonly known as:  PREMARIN  Place 1 Applicatorful vaginally 2 (two) times a week. Uses on Tues and Fri     doxycycline 50 MG capsule  Commonly known as:  VIBRAMYCIN  Take 2 capsules (100 mg total) by mouth 2 (two) times daily.     DULoxetine 30 MG capsule  Commonly known as:  CYMBALTA  Take 30 mg by mouth every morning.     fexofenadine 180 MG tablet  Commonly known as:  ALLEGRA  Take 180 mg by mouth every morning.     GLUCERNA Liqd  Take 237 mLs by mouth 3 (three) times daily. Vanilla only     insulin aspart 100 UNIT/ML injection  Commonly known as:  novoLOG  Inject 5 Units into the skin 3 (three) times daily before meals.     insulin glargine 100 UNIT/ML injection  Commonly known as:  LANTUS  Inject 16 Units into the skin at bedtime.     iron polysaccharides 150 MG capsule  Commonly known as:  NIFEREX  Take 150 mg by mouth 2 (two) times daily.     LORazepam 0.5 MG tablet  Commonly known as:  ATIVAN  Take 1 tablet (0.5 mg total) by mouth every 12 (twelve) hours. For anxiety.     methylcellulose 1 % ophthalmic solution  Commonly known as:  ARTIFICIAL TEARS  Place 1 drop into both eyes daily as needed (for dry eyes).     morphine 15 MG 12 hr tablet  Commonly known as:  MS CONTIN  Take 1 tablet (15 mg total) by mouth every 12 (twelve) hours.     olopatadine 0.1 % ophthalmic solution  Commonly known as:  PATANOL  Place 1 drop into both eyes 2 (two) times daily as needed (itching/watery eyes).     omeprazole 20 MG capsule  Commonly known as:  PRILOSEC  Take 20 mg by mouth every morning.     oxyCODONE-acetaminophen 5-325 MG per tablet  Commonly known as:  PERCOCET/ROXICET  Take 1 tablet by mouth every 4 (four) hours as needed for moderate pain or severe pain.     polyethylene glycol packet  Commonly known as:  MIRALAX / GLYCOLAX  Take 17 g by mouth daily as needed for moderate constipation.     potassium chloride SA 20 MEQ  tablet  Commonly known as:  K-DUR,KLOR-CON  Take 20 mEq by mouth every morning.     SENNA-S 8.6-50 MG per tablet  Generic drug:  senna-docusate  Take 2 tablets by mouth at bedtime.           Follow-up Information   Follow up with DUDA,MARCUS V, MD In 2 weeks.   Specialty:  Orthopedic Surgery   Contact information:   [address] [place] [address] [phone]       Signed: Nadara Mustard 04/02/2014, 6:30 AM

## 2014-04-02 NOTE — Progress Notes (Signed)
Patient ID: [name], female   DOB: 05/02/1933, 80 y.o.   MRN: [id number] Patient currently lives at an assisted living. Patient states that she does not feel safe being discharge back to assisted living. Patient may require skilled nursing placement. Orders written for skilled nursing placement of evaluation. Status post amputation of the right great toe. Dressing clean dry and intact.

## 2014-04-02 NOTE — Care Management Note (Signed)
CARE MANAGEMENT NOTE 04/02/2014  Patient:  [name], [name]   Account Number:  [id number]  Date Initiated:  04/02/2014  Documentation initiated by:  Vance Peper  Subjective/Objective Assessment:   80 yr old female admitted with osteomyelitis of right great toe. S/p right great toe amputation.     Action/Plan:   Patient will return to [facility] [address], [place]. Case manager faxed order for HHPT to [facility] @  [phone]. HH provided at [name].   Anticipated DC Date:  04/02/2014   Anticipated DC Plan:  ASSISTED LIVING / REST HOME  In-house referral  Clinical Social Worker      DC Associate Professor  CM consult      [facility] Choice  HOME HEALTH   Choice offered to / List presented to:  NA   DME arranged  NA        HH arranged  HH-2 PT      HH agency  OTHER - SEE NOTE   Status of service:  Completed, signed off Medicare Important Message given?  YES (If response is "NO", the following Medicare IM given date fields will be blank) Date Medicare IM given:  04/02/2014 Medicare IM given by:  Vance Peper Date Additional Medicare IM given:   Additional Medicare IM given by:    Discharge Disposition:  HOME W HOME HEALTH SERVICES

## 2014-04-02 NOTE — Progress Notes (Signed)
Physical Therapy Treatment Patient Details Name: [name] MRN: [id number] DOB: 1933/05/28 Today's Date: 04/02/2014    History of Present Illness Patient admitted for elective amputation of Right great toe amputation, with history of depression, DM, incontinence    PT Comments    Patient progressing slowly but agreeable to get OOB to recliner. Patient is planning to DC today. Patient and husband asking some questions regarding DC and was able to discuss assistance that would be needed once discharged back to [place]. Planning to DC later today  Follow Up Recommendations  Home health PT (at assistive living)     Equipment Recommendations  None recommended by PT    Recommendations for Other Services       Precautions / Restrictions Precautions Precautions: Fall Restrictions RLE Weight Bearing: Weight bearing as tolerated Other Position/Activity Restrictions: with Darco shoe through heel    Mobility  Bed Mobility Overal bed mobility: Needs Assistance Bed Mobility: Supine to Sit     Supine to sit: Supervision        Transfers Overall transfer level: Needs assistance Equipment used: Rolling walker (2 wheeled)   Sit to Stand: Min assist         General transfer comment: Min A to power up and to ensure anterior weight shift with stand. Cues for hand placement and not to pull up solely on RW  Ambulation/Gait Ambulation/Gait assistance: Min assist Ambulation Distance (Feet): 6 Feet Assistive device: Rolling walker (2 wheeled) Gait Pattern/deviations: Step-to pattern;Antalgic Gait velocity: decreased   General Gait Details: cues on posture and walker use. A to ensure balance   Stairs            Wheelchair Mobility    Modified Rankin (Stroke Patients Only)       Balance                                    Cognition Arousal/Alertness: Awake/alert Behavior During Therapy: WFL for tasks assessed/performed Overall Cognitive  Status: History of cognitive impairments - at baseline                      Exercises      General Comments        Pertinent Vitals/Pain Pain Assessment:  (Did not rate pain)    Home Living                      Prior Function            PT Goals (current goals can now be found in the care plan section) Progress towards PT goals: Progressing toward goals    Frequency  Min 5X/week    PT Plan Current plan remains appropriate    Co-evaluation             End of Session Equipment Utilized During Treatment: Gait belt Activity Tolerance: Patient tolerated treatment well;Patient limited by fatigue Patient left: in chair;with call bell/phone within reach     Time: 1107-1130 PT Time Calculation (min): 23 min  Charges:  $Gait Training: 8-22 mins $Therapeutic Activity: 8-22 mins                    G Codes:      Fredrich Birks 04/02/2014, 2:49 PM 04/02/2014 Fredrich Birks PTA [phone] pager [phone] office

## 2014-04-02 NOTE — Clinical Social Work Psychosocial (Signed)
Clinical Social Work Department BRIEF PSYCHOSOCIAL ASSESSMENT 04/02/2014  Patient:  [name], [name]     Account Number:  [id number]     Admit date:  03/30/2014  Clinical Social Worker:  Read Drivers  Date/Time:  04/02/2014 05:06 PM  Referred by:  Physician  Date Referred:  04/02/2014 Referred for  SNF Placement   Other Referral:   none   Interview type:  Other - See comment Other interview type:   pt and husband [name]    PSYCHOSOCIAL DATA Living Status:  FACILITY Admitted from facility:  Other Level of care:   Primary support name:  [name] Primary support relationship to patient:  SPOUSE Degree of support available:   adequate  Pt from [name]    CURRENT CONCERNS Current Concerns  Post-Acute Placement   Other Concerns:   none    SOCIAL WORK ASSESSMENT / PLAN Pt is from [facility] on [address].  Pt and husband state they will be returning there once pt has been medically cleared for dc. At this time, PT is recommending Home Health PT/OT.  RNCM aware and has set up these services for the pt.  CSW coordinated care with ALF for pt return.  Pt and husannd are appreciative of services being offered in the home and not in a facility.  Pt and husband are looking forward to pt's progress.   Assessment/plan status:  Psychosocial Support/Ongoing Assessment of Needs Other assessment/ plan:   FL2-updated  [facility] existing   Information/referral to community resources:   ALF  [facility] PT/OT    PATIENT'S/FAMILY'S RESPONSE TO PLAN OF CARE: Pt and husband agreeable to return to ALF with [facility] services.       [name], LCSWA [phone]  Clinical Social Work

## 2014-04-02 NOTE — Progress Notes (Signed)
Spoke with husband at bedside regarding patients discharge plan. He states that he would like her to go back to assisted living facility. Per therapy note patient is safe to discharge to her facility. At this time patient and husband voice no need for SNF. Will follow up with plan.

## 2014-04-02 NOTE — Progress Notes (Signed)
Patient being discharged to [facility] where she lived prior to admit. She is being trasnported via [facility].

## 2014-04-04 ENCOUNTER — Ambulatory Visit (INDEPENDENT_AMBULATORY_CARE_PROVIDER_SITE_OTHER): Payer: Medicare Other | Admitting: Ophthalmology

## 2014-04-30 ENCOUNTER — Encounter: Payer: Medicare Other | Admitting: Podiatry

## 2014-04-30 NOTE — Progress Notes (Signed)
   Subjective:    Patient ID: ILIANNA [name], female    DOB: [birth year], 80 y.o.   MRN: [id number]  HPI  Pt presents for nail debridement  Review of Systems     Objective:   Physical Exam        Assessment & Plan:    This encounter was created in error - please disregard.

## 2014-05-09 ENCOUNTER — Ambulatory Visit (INDEPENDENT_AMBULATORY_CARE_PROVIDER_SITE_OTHER): Payer: Medicare Other | Admitting: Ophthalmology

## 2014-05-09 DIAGNOSIS — E11319 Type 2 diabetes mellitus with unspecified diabetic retinopathy without macular edema: Secondary | ICD-10-CM

## 2014-05-09 DIAGNOSIS — H43819 Vitreous degeneration, unspecified eye: Secondary | ICD-10-CM

## 2014-05-09 DIAGNOSIS — I10 Essential (primary) hypertension: Secondary | ICD-10-CM | POA: Diagnosis not present

## 2014-05-09 DIAGNOSIS — E1165 Type 2 diabetes mellitus with hyperglycemia: Secondary | ICD-10-CM

## 2014-05-09 DIAGNOSIS — E1139 Type 2 diabetes mellitus with other diabetic ophthalmic complication: Secondary | ICD-10-CM

## 2014-05-09 DIAGNOSIS — H35039 Hypertensive retinopathy, unspecified eye: Secondary | ICD-10-CM | POA: Diagnosis not present

## 2014-05-10 ENCOUNTER — Ambulatory Visit (INDEPENDENT_AMBULATORY_CARE_PROVIDER_SITE_OTHER): Payer: Medicare Other

## 2014-05-10 DIAGNOSIS — B351 Tinea unguium: Secondary | ICD-10-CM

## 2014-05-10 DIAGNOSIS — Q828 Other specified congenital malformations of skin: Secondary | ICD-10-CM

## 2014-05-10 DIAGNOSIS — E1151 Type 2 diabetes mellitus with diabetic peripheral angiopathy without gangrene: Secondary | ICD-10-CM

## 2014-05-10 DIAGNOSIS — M79673 Pain in unspecified foot: Secondary | ICD-10-CM

## 2014-05-10 NOTE — Progress Notes (Signed)
   Subjective:    Patient ID: Tonya ChildsBertha L Pharr, female    DOB: 11/05/1932, 80 y.o.   MRN: [id number]  HPI Pt presents for nail debridement    Review of Systems     Objective:   Physical Exam        Assessment & Plan:

## 2014-05-11 NOTE — Progress Notes (Signed)
Subjective:     Patient ID: Len ChildsBertha L Hammac, female   DOB: 11/28/1932, 80 y.o.   MRN: [id number]  HPI patient presents with thick painful nailbeds 1-5 both feet that she cannot cut   Review of Systems     Objective:   Physical Exam Neurovascular status intact with thick yellow brittle nailbeds 1-5 both feet with pain    Assessment:     Mycotic nail infection with pain 1-5 both feet    Plan:     Debride painful nailbeds 1-5 both feet with no iatrogenic bleeding noted

## 2014-06-01 ENCOUNTER — Inpatient Hospital Stay (HOSPITAL_COMMUNITY)
Admission: EM | Admit: 2014-06-01 | Discharge: 2014-06-06 | DRG: 504 | Disposition: A | Discharge status: Home Health | Payer: Medicare Other | Attending: Family Medicine | Admitting: Family Medicine

## 2014-06-01 ENCOUNTER — Encounter (HOSPITAL_COMMUNITY): Payer: Self-pay | Admitting: Emergency Medicine

## 2014-06-01 ENCOUNTER — Emergency Department (HOSPITAL_COMMUNITY): Payer: Medicare Other

## 2014-06-01 DIAGNOSIS — L97519 Non-pressure chronic ulcer of other part of right foot with unspecified severity: Secondary | ICD-10-CM | POA: Diagnosis present

## 2014-06-01 DIAGNOSIS — M86171 Other acute osteomyelitis, right ankle and foot: Principal | ICD-10-CM

## 2014-06-01 DIAGNOSIS — M79675 Pain in left toe(s): Secondary | ICD-10-CM

## 2014-06-01 DIAGNOSIS — M86671 Other chronic osteomyelitis, right ankle and foot: Secondary | ICD-10-CM | POA: Diagnosis present

## 2014-06-01 DIAGNOSIS — E08621 Diabetes mellitus due to underlying condition with foot ulcer: Secondary | ICD-10-CM

## 2014-06-01 DIAGNOSIS — M545 Low back pain, unspecified: Secondary | ICD-10-CM

## 2014-06-01 DIAGNOSIS — Z7982 Long term (current) use of aspirin: Secondary | ICD-10-CM

## 2014-06-01 DIAGNOSIS — L03115 Cellulitis of right lower limb: Secondary | ICD-10-CM | POA: Diagnosis present

## 2014-06-01 DIAGNOSIS — I739 Peripheral vascular disease, unspecified: Secondary | ICD-10-CM | POA: Diagnosis present

## 2014-06-01 DIAGNOSIS — K219 Gastro-esophageal reflux disease without esophagitis: Secondary | ICD-10-CM

## 2014-06-01 DIAGNOSIS — M25474 Effusion, right foot: Secondary | ICD-10-CM

## 2014-06-01 DIAGNOSIS — F329 Major depressive disorder, single episode, unspecified: Secondary | ICD-10-CM | POA: Diagnosis present

## 2014-06-01 DIAGNOSIS — N390 Urinary tract infection, site not specified: Secondary | ICD-10-CM

## 2014-06-01 DIAGNOSIS — G9341 Metabolic encephalopathy: Secondary | ICD-10-CM

## 2014-06-01 DIAGNOSIS — E11649 Type 2 diabetes mellitus with hypoglycemia without coma: Secondary | ICD-10-CM | POA: Diagnosis present

## 2014-06-01 DIAGNOSIS — L97509 Non-pressure chronic ulcer of other part of unspecified foot with unspecified severity: Secondary | ICD-10-CM

## 2014-06-01 DIAGNOSIS — M86172 Other acute osteomyelitis, left ankle and foot: Secondary | ICD-10-CM | POA: Diagnosis present

## 2014-06-01 DIAGNOSIS — I10 Essential (primary) hypertension: Secondary | ICD-10-CM | POA: Diagnosis present

## 2014-06-01 DIAGNOSIS — K922 Gastrointestinal hemorrhage, unspecified: Secondary | ICD-10-CM

## 2014-06-01 DIAGNOSIS — F32A Depression, unspecified: Secondary | ICD-10-CM | POA: Diagnosis present

## 2014-06-01 DIAGNOSIS — Z89411 Acquired absence of right great toe: Secondary | ICD-10-CM

## 2014-06-01 DIAGNOSIS — Z794 Long term (current) use of insulin: Secondary | ICD-10-CM

## 2014-06-01 DIAGNOSIS — E119 Type 2 diabetes mellitus without complications: Secondary | ICD-10-CM

## 2014-06-01 DIAGNOSIS — R410 Disorientation, unspecified: Secondary | ICD-10-CM

## 2014-06-01 DIAGNOSIS — M79605 Pain in left leg: Secondary | ICD-10-CM

## 2014-06-01 DIAGNOSIS — E162 Hypoglycemia, unspecified: Secondary | ICD-10-CM

## 2014-06-01 DIAGNOSIS — M869 Osteomyelitis, unspecified: Secondary | ICD-10-CM

## 2014-06-01 DIAGNOSIS — E440 Moderate protein-calorie malnutrition: Secondary | ICD-10-CM

## 2014-06-01 DIAGNOSIS — M7989 Other specified soft tissue disorders: Secondary | ICD-10-CM

## 2014-06-01 DIAGNOSIS — Z22322 Carrier or suspected carrier of Methicillin resistant Staphylococcus aureus: Secondary | ICD-10-CM

## 2014-06-01 LAB — BASIC METABOLIC PANEL
Anion gap: 9 (ref 5–15)
BUN: 25 mg/dL — ABNORMAL HIGH (ref 6–23)
CALCIUM: 9.9 mg/dL (ref 8.4–10.5)
CHLORIDE: 95 meq/L — AB (ref 96–112)
CO2: 34 mEq/L — ABNORMAL HIGH (ref 19–32)
CREATININE: 0.67 mg/dL (ref 0.50–1.10)
GFR calc non Af Amer: 81 mL/min — ABNORMAL LOW (ref >90)
Glucose, Bld: 60 mg/dL — ABNORMAL LOW (ref 70–99)
Potassium: 4 mEq/L (ref 3.7–5.3)
Sodium: 138 mEq/L (ref 137–147)

## 2014-06-01 LAB — CBC
HCT: 40.2 % (ref 36.0–46.0)
Hemoglobin: 12.9 g/dL (ref 12.0–15.0)
MCH: 27.8 pg (ref 26.0–34.0)
MCHC: 32.1 g/dL (ref 30.0–36.0)
MCV: 86.6 fL (ref 78.0–100.0)
PLATELETS: 350 10*3/uL (ref 150–400)
RBC: 4.64 MIL/uL (ref 3.87–5.11)
RDW: 13.5 % (ref 11.5–15.5)
WBC: 7.6 10*3/uL (ref 4.0–10.5)

## 2014-06-01 LAB — ALBUMIN: Albumin: 3.5 g/dL (ref 3.5–5.2)

## 2014-06-01 LAB — GLUCOSE, CAPILLARY
GLUCOSE-CAPILLARY: 124 mg/dL — AB (ref 70–99)
Glucose-Capillary: 252 mg/dL — ABNORMAL HIGH (ref 70–99)

## 2014-06-01 LAB — MAGNESIUM: MAGNESIUM: 2 mg/dL (ref 1.5–2.5)

## 2014-06-01 LAB — SEDIMENTATION RATE: Sed Rate: 62 mm/hr — ABNORMAL HIGH (ref 0–22)

## 2014-06-01 LAB — C-REACTIVE PROTEIN: CRP: 4.4 mg/dL — ABNORMAL HIGH (ref <0.60)

## 2014-06-01 MED ORDER — ENOXAPARIN SODIUM 40 MG/0.4ML ~~LOC~~ SOLN
40.0000 mg | SUBCUTANEOUS | Status: DC
  Administered 2014-06-01 – 2014-06-05 (×4): 40 mg via SUBCUTANEOUS
  Filled 2014-06-01 (×6): qty 0.4

## 2014-06-01 MED ORDER — OLOPATADINE HCL 0.1 % OP SOLN
1.0000 [drp] | Freq: Two times a day (BID) | OPHTHALMIC | Status: DC | PRN
  Administered 2014-06-02 – 2014-06-03 (×2): 1 [drp] via OPHTHALMIC
  Filled 2014-06-01: qty 5

## 2014-06-01 MED ORDER — POLYSACCHARIDE IRON COMPLEX 150 MG PO CAPS
150.0000 mg | ORAL_CAPSULE | Freq: Two times a day (BID) | ORAL | Status: DC
  Administered 2014-06-01 – 2014-06-06 (×9): 150 mg via ORAL
  Filled 2014-06-01 (×12): qty 1

## 2014-06-01 MED ORDER — ONDANSETRON HCL 4 MG PO TABS: 4.0000 mg | ORAL_TABLET | Freq: Four times a day (QID) | ORAL | Status: DC | PRN

## 2014-06-01 MED ORDER — INSULIN GLARGINE 100 UNIT/ML ~~LOC~~ SOLN
16.0000 [IU] | Freq: Every day | SUBCUTANEOUS | Status: DC
  Administered 2014-06-01 – 2014-06-04 (×4): 16 [IU] via SUBCUTANEOUS
  Filled 2014-06-01 (×4): qty 0.16

## 2014-06-01 MED ORDER — POLYETHYLENE GLYCOL 3350 17 G PO PACK
17.0000 g | PACK | Freq: Every day | ORAL | Status: DC | PRN
  Filled 2014-06-01: qty 1

## 2014-06-01 MED ORDER — ALBUTEROL SULFATE (2.5 MG/3ML) 0.083% IN NEBU: 2.5000 mg | INHALATION_SOLUTION | RESPIRATORY_TRACT | Status: DC | PRN

## 2014-06-01 MED ORDER — INSULIN ASPART 100 UNIT/ML ~~LOC~~ SOLN: 5.0000 [IU] | Freq: Three times a day (TID) | SUBCUTANEOUS | Status: DC

## 2014-06-01 MED ORDER — ESTROGENS, CONJUGATED 0.625 MG/GM VA CREA
1.0000 | TOPICAL_CREAM | VAGINAL | Status: DC
  Filled 2014-06-01: qty 42.5

## 2014-06-01 MED ORDER — VITAMIN D3 25 MCG (1000 UNIT) PO TABS
1000.0000 [IU] | ORAL_TABLET | Freq: Every morning | ORAL | Status: DC
  Administered 2014-06-02 – 2014-06-06 (×5): 1000 [IU] via ORAL
  Filled 2014-06-01 (×5): qty 1

## 2014-06-01 MED ORDER — OXYCODONE-ACETAMINOPHEN 5-325 MG PO TABS: 1.0000 | ORAL_TABLET | ORAL | Status: DC | PRN

## 2014-06-01 MED ORDER — METHYLCELLULOSE 1 % OP SOLN: 1.0000 [drp] | Freq: Every day | OPHTHALMIC | Status: DC | PRN

## 2014-06-01 MED ORDER — SODIUM CHLORIDE 0.9 % IV SOLN
INTRAVENOUS | Status: AC
  Administered 2014-06-01 – 2014-06-02 (×2): via INTRAVENOUS
  Filled 2014-06-01 (×2): qty 1000

## 2014-06-01 MED ORDER — DULOXETINE HCL 30 MG PO CPEP
30.0000 mg | ORAL_CAPSULE | Freq: Every morning | ORAL | Status: DC
Start: 2014-06-02 — End: 2014-06-06
  Administered 2014-06-02 – 2014-06-06 (×5): 30 mg via ORAL
  Filled 2014-06-01 (×5): qty 1

## 2014-06-01 MED ORDER — INSULIN ASPART 100 UNIT/ML ~~LOC~~ SOLN: 3.0000 [IU] | Freq: Three times a day (TID) | SUBCUTANEOUS | Status: DC

## 2014-06-01 MED ORDER — PANTOPRAZOLE SODIUM 40 MG PO TBEC
40.0000 mg | DELAYED_RELEASE_TABLET | Freq: Every day | ORAL | Status: DC
  Administered 2014-06-02 – 2014-06-06 (×4): 40 mg via ORAL
  Filled 2014-06-01 (×4): qty 1

## 2014-06-01 MED ORDER — LORAZEPAM 0.5 MG PO TABS
0.5000 mg | ORAL_TABLET | Freq: Two times a day (BID) | ORAL | Status: DC
  Administered 2014-06-01 – 2014-06-06 (×10): 0.5 mg via ORAL
  Filled 2014-06-01 (×10): qty 1

## 2014-06-01 MED ORDER — GLUCERNA PO LIQD: 237.0000 mL | Freq: Three times a day (TID) | ORAL | Status: DC

## 2014-06-01 MED ORDER — ONDANSETRON HCL 4 MG/2ML IJ SOLN: 4.0000 mg | Freq: Four times a day (QID) | INTRAMUSCULAR | Status: DC | PRN

## 2014-06-01 MED ORDER — VANCOMYCIN HCL 500 MG IV SOLR
500.0000 mg | Freq: Two times a day (BID) | INTRAVENOUS | Status: DC
  Administered 2014-06-02 – 2014-06-05 (×7): 500 mg via INTRAVENOUS
  Filled 2014-06-01 (×8): qty 500

## 2014-06-01 MED ORDER — CALCIUM CITRATE-VITAMIN D 315-200 MG-UNIT PO TABS: 2.0000 | ORAL_TABLET | Freq: Every morning | ORAL | Status: DC

## 2014-06-01 MED ORDER — ACETAMINOPHEN 325 MG PO TABS: 650.0000 mg | ORAL_TABLET | Freq: Four times a day (QID) | ORAL | Status: DC | PRN

## 2014-06-01 MED ORDER — ASPIRIN EC 81 MG PO TBEC
81.0000 mg | DELAYED_RELEASE_TABLET | Freq: Every morning | ORAL | Status: DC
  Administered 2014-06-02 – 2014-06-06 (×5): 81 mg via ORAL
  Filled 2014-06-01 (×5): qty 1

## 2014-06-01 MED ORDER — VANCOMYCIN HCL 10 G IV SOLR
1250.0000 mg | INTRAVENOUS | Status: DC
  Filled 2014-06-01: qty 1250

## 2014-06-01 MED ORDER — ACETAMINOPHEN 650 MG RE SUPP: 650.0000 mg | Freq: Four times a day (QID) | RECTAL | Status: DC | PRN

## 2014-06-01 MED ORDER — GLUCERNA SHAKE PO LIQD
237.0000 mL | Freq: Three times a day (TID) | ORAL | Status: DC
  Administered 2014-06-01 – 2014-06-06 (×14): 237 mL via ORAL
  Filled 2014-06-01 (×5): qty 237

## 2014-06-01 MED ORDER — MORPHINE SULFATE ER 15 MG PO TBCR
15.0000 mg | EXTENDED_RELEASE_TABLET | Freq: Two times a day (BID) | ORAL | Status: DC
  Administered 2014-06-01 – 2014-06-06 (×10): 15 mg via ORAL
  Filled 2014-06-01 (×10): qty 1

## 2014-06-01 MED ORDER — POTASSIUM CHLORIDE CRYS ER 20 MEQ PO TBCR
20.0000 meq | EXTENDED_RELEASE_TABLET | Freq: Every morning | ORAL | Status: DC
  Administered 2014-06-02 – 2014-06-06 (×5): 20 meq via ORAL
  Filled 2014-06-01 (×6): qty 1

## 2014-06-01 MED ORDER — PIPERACILLIN-TAZOBACTAM 3.375 G IVPB 30 MIN
3.3750 g | INTRAVENOUS | Status: AC
  Administered 2014-06-01: 3.375 g via INTRAVENOUS
  Filled 2014-06-01: qty 50

## 2014-06-01 MED ORDER — POLYVINYL ALCOHOL 1.4 % OP SOLN
1.0000 [drp] | OPHTHALMIC | Status: DC | PRN
  Administered 2014-06-02 – 2014-06-03 (×2): 1 [drp] via OPHTHALMIC
  Filled 2014-06-01: qty 15

## 2014-06-01 MED ORDER — LORATADINE 10 MG PO TABS
10.0000 mg | ORAL_TABLET | Freq: Every day | ORAL | Status: DC
  Administered 2014-06-02 – 2014-06-06 (×5): 10 mg via ORAL
  Filled 2014-06-01 (×5): qty 1

## 2014-06-01 MED ORDER — INSULIN ASPART 100 UNIT/ML ~~LOC~~ SOLN
0.0000 [IU] | Freq: Three times a day (TID) | SUBCUTANEOUS | Status: DC
  Administered 2014-06-02: 1 [IU] via SUBCUTANEOUS
  Administered 2014-06-02: 5 [IU] via SUBCUTANEOUS
  Administered 2014-06-03: 2 [IU] via SUBCUTANEOUS
  Administered 2014-06-04: 3 [IU] via SUBCUTANEOUS
  Administered 2014-06-04: 2 [IU] via SUBCUTANEOUS
  Administered 2014-06-05: 3 [IU] via SUBCUTANEOUS
  Administered 2014-06-05: 2 [IU] via SUBCUTANEOUS
  Administered 2014-06-06: 7 [IU] via SUBCUTANEOUS
  Administered 2014-06-06: 1 [IU] via SUBCUTANEOUS

## 2014-06-01 MED ORDER — CALCIUM CARBONATE-VITAMIN D 500-200 MG-UNIT PO TABS
1.0000 | ORAL_TABLET | Freq: Every day | ORAL | Status: DC
  Administered 2014-06-02 – 2014-06-06 (×3): 1 via ORAL
  Filled 2014-06-01 (×6): qty 1

## 2014-06-01 MED ORDER — VANCOMYCIN HCL IN DEXTROSE 750-5 MG/150ML-% IV SOLN
750.0000 mg | Freq: Once | INTRAVENOUS | Status: AC
  Administered 2014-06-01: 750 mg via INTRAVENOUS
  Filled 2014-06-01: qty 150

## 2014-06-01 MED ORDER — PIPERACILLIN-TAZOBACTAM 3.375 G IVPB
3.3750 g | Freq: Three times a day (TID) | INTRAVENOUS | Status: DC
  Administered 2014-06-02 – 2014-06-05 (×10): 3.375 g via INTRAVENOUS
  Filled 2014-06-01 (×12): qty 50

## 2014-06-01 MED ORDER — SENNOSIDES-DOCUSATE SODIUM 8.6-50 MG PO TABS
2.0000 | ORAL_TABLET | Freq: Every day | ORAL | Status: DC
  Administered 2014-06-01 – 2014-06-05 (×3): 2 via ORAL
  Filled 2014-06-01 (×5): qty 2

## 2014-06-01 MED ORDER — ALUM & MAG HYDROXIDE-SIMETH 200-200-20 MG/5ML PO SUSP: 30.0000 mL | Freq: Four times a day (QID) | ORAL | Status: DC | PRN

## 2014-06-01 NOTE — Progress Notes (Addendum)
ANTIBIOTIC CONSULT NOTE - INITIAL  Pharmacy Consult for:  Vancomycin  Indication:  Cellulitis and osteomyelitis of both feet  Allergies  Allergen Reactions  . Ciprofloxacin Other (See Comments)    Leg and feet numbness  . Lidoderm [Lidocaine] Itching, Rash and Other (See Comments)    Burning sensations also  . Ace Inhibitors Other (See Comments)    Poorly tolerated-per PCP  . Angiotensin Receptor Blockers Other (See Comments)    Poorly tolerated-per PCP  . Glucotrol [Glipizide] Nausea Only and Other (See Comments)    Headaches also  . Triple Antibiotic [Bacitracin-Neomycin-Polymyxin] Other (See Comments)    Poorly tolerated  . Neosporin [Neomycin-Bacitracin Zn-Polymyx] Rash  . Sulfa Antibiotics Rash  . Zyrtec [Cetirizine] Itching and Rash    Patient Measurements: 03/30/14 - Height 64 inches   Weight 68 kg  Vital Signs: Temp: 98.3 F (36.8 C) (10/23 1643) Temp Source: Oral (10/23 1643) BP: 139/70 mmHg (10/23 1643) Pulse Rate: 72 (10/23 1643)   Labs:  Recent Labs  06/01/14 1442  WBC 7.6  HGB 12.9  PLT 350  CREATININE 0.67   The estimated creatinine clearance is 54 ml/min using SCr 0.67 and the Cockroft-Gault equation.  Microbiology: No results found for this or any previous visit (from the past 720 hour(s)).  Medical History: Past Medical History  Diagnosis Date  . Incontinence of urine   . Diarrhea   . Neuropathy     bilateral feet  . GERD (gastroesophageal reflux disease)   . Depression   . Urinary, incontinence, stress female   . History of shingles 2009  . Degenerative lumbar spinal stenosis   . Type II diabetes mellitus     Type 2 IDDM  X 40 years;   . Arthritis     lower back/hands  . Altered mental status 01/13/2013  . Hypertension   . Pneumonia     Medications:  Pending  Assessment:  Asked to assist with antibiotic therapy -- Zosyn and Vancomycin -- for this 80 year-old female with diabetes and cellulitis of feet.  Results of Xrays  performed in the ED this afternoon also indicate concern for osteomyelitis of both feet .  This note is amended to indicate the higher Vancomycin dose and trough levels needed for the treatment of osteomyelitis.  Goals of Therapy:   Vancomycin trough levels 15-20 mcg/ml  Eradication of infection  Plan:   Zosyn 3.375 grams IV over 30 minutes in the ED, then 3.375 grams IV every 8 hours -- each dose infused over 4 hours.  Vancomycin 750 mg x 1, then 500 mg IV every 12 hours.  Vancomycin levels as needed to guide dosing.  Polo RileyHaskins, Fiona Coto R 06/01/2014,5:28 PM and 21:34 PM

## 2014-06-01 NOTE — Progress Notes (Signed)
  CARE MANAGEMENT ED NOTE 06/01/2014  Patient:  [name],[name]   Account Number:  [id number]  Date Initiated:  06/01/2014  Documentation initiated by:  Radford PaxFERRERO,Mable Dara  Subjective/Objective Assessment:   Patient presents to Ed with c/o bleeding toe.     Subjective/Objective Assessment Detail:   Patient with pmhx of DM, GERD, depression, HTN, Pneumonia. right great toe amputation on 03/30/2014  Xray right foot: 1. Diffuse soft tissue swelling consistent with cellulitis.  2. Lytic area along the lateral aspect of the distal right first  metatarsal consistent with a prominent focus of osteomyelitis  Xray left great toe: There is bony defect in the tip of the first distal phalangeal tuft  which appear slightly more irregular compared to the previous exam;  this is suspicious for osteomyelitis.     Action/Plan:   Action/Plan Detail:   Anticipated DC Date:       Status Recommendation to Physician:   Result of Recommendation:    Other ED Services  Consult Working Plan   In-house referral  Clinical Social Worker   DC Associate Professorlanning Services  CM consult  Other    Choice offered to / List presented to:            Status of service:  Completed, signed off  ED Comments:   ED Comments Detail:  [facility]  EDCM spoke to patient at bedside.  Patient confirms she is from [facility].  Patient reports she has a wheelchair, walker and safety rails in her bathroom at home.  Patient's wheelchair at bedside.  [facility] can [facility]?  Patient reports, "The doctor doesn't want me to put any weight on my feet."  [name] and spoke to Sue LushAndrea at 1712pm.  Per Christiana FuchsAndrea, [name] their [facility]  Patient would just need home health orders to come back with her to facility for home health. No further EDCM needs at this time.

## 2014-06-01 NOTE — ED Notes (Signed)
Patient ate applesauce and apple juice. Patiaent refused sandwich and cheese that was offered.

## 2014-06-01 NOTE — Plan of Care (Signed)
Problem: Phase I Progression Outcomes Goal: Pain controlled with appropriate interventions Outcome: Progressing Will give prn meds as needed.

## 2014-06-01 NOTE — ED Notes (Signed)
Patient transported to X-ray 

## 2014-06-01 NOTE — H&P (Signed)
[facility] History and Physical  Len ChildsBertha L Brunet BJY:[id number]:[id number] DOB: 01/21/1933 DOA: 06/01/2014  Referring physician: Dr. Unknown JimIval Knapp PCP: Lillia MountainGRIFFIN,JOHN JOSEPH, MD /orthopedics: Dr.Duda  Chief Complaint: Right foot swelling and pain  HPI: Len ChildsBertha L Papesh is a 80 y.o. female  With history of type 2 diabetes x40 years insulin-dependent with bilateral neuropathy, gastroesophageal reflux disease, depression, degenerative lumbar spinal stenosis, arthritis, hypertension, history of osteomyelitis of the right great toe status post right great toe amputation at [facility] 03/30/2014 per Dr.Duda, peripheral vascular disease who presents to the emergency room with 3 to four-day history of worsening right foot swelling with erythema, warmth and tenderness to palpation. Worsening swelling warmth and tenderness to the second great toe. Patient denies any fevers, no chills. Patient denies any diarrhea, no weakness, no hematemesis, no hematochezia, no melena, no abdominal pain, no shortness of breath. Patient does endorse occasional intermittent nausea and some left sided chest pain this either breast has been ongoing for several months.. Patient was seen in the emergency room plain films of bilateral feet were done which were concerning for osteomyelitis. Basic metabolic profile and a chloride of 95 bicarbonate of 34 BUN of 25 otherwise was within normal limits. CBC was unremarkable. Orthopedics was consulted by ED physician who spoke with Dr.Duda who had recommended admission for IV antibiotics and consultation in the morning per Dr. Lajoyce Cornersuda. [facility] were called to admit the patient for further evaluation and management.   Review of Systems: As per history of present illness otherwise negative. Constitutional:  No weight loss, night sweats, Fevers, chills, fatigue.  HEENT:  No headaches, Difficulty swallowing,Tooth/dental problems,Sore throat,  No sneezing, itching, ear ache, nasal congestion,  post nasal drip,  Cardio-vascular:  No chest pain, Orthopnea, PND, swelling in lower extremities, anasarca, dizziness, palpitations  GI:  No heartburn, indigestion, abdominal pain, nausea, vomiting, diarrhea, change in bowel habits, loss of appetite  Resp:  No shortness of breath with exertion or at rest. No excess mucus, no productive cough, No non-productive cough, No coughing up of blood.No change in color of mucus.No wheezing.No chest wall deformity  Skin:  no rash or lesions.  GU:  no dysuria, change in color of urine, no urgency or frequency. No flank pain.  Musculoskeletal:  No joint pain or swelling. No decreased range of motion. No back pain.  Psych:  No change in mood or affect. No depression or anxiety. No memory loss.   Past Medical History  Diagnosis Date  . Incontinence of urine   . Diarrhea   . Neuropathy     bilateral feet  . GERD (gastroesophageal reflux disease)   . Depression   . Urinary, incontinence, stress female   . History of shingles 2009  . Degenerative lumbar spinal stenosis   . Type II diabetes mellitus     Type 2 IDDM  X 40 years;   . Arthritis     lower back/hands  . Altered mental status 01/13/2013  . Hypertension   . Pneumonia    Past Surgical History  Procedure Laterality Date  . Incontinence surgery  2005  . Tonsillectomy  1964  . Abdominal hysterectomy  1982  . Ovarian cyst surgery  1972  . Cholecystectomy  1988  . Cystoscopy  2009    macroplastique inj  . Rectocele repair  10/15/2011    Procedure: POSTERIOR REPAIR (RECTOCELE);  Surgeon: Kathi LudwigSigmund I Tannenbaum, MD;  [facility] [place];  Service: Urology;;  posterior vaginal vault repair with sacral spinus repair  with graft  . Cystoscopy  10/15/2011    Procedure: CYSTOSCOPY;  Surgeon: Kathi Ludwig, MD;  Location: [facility];  Service: Urology;  Laterality: N/A;  . Flexible sigmoidoscopy  02/28/2012    Procedure: FLEXIBLE SIGMOIDOSCOPY;  Surgeon:  Willis Modena, MD;  Location: [facility];  Service: Endoscopy;  Laterality: N/A;  . Breast biopsy  1990    bilaterally  . Cataract extraction w/ intraocular lens implant    . Posterior fusion lumbar spine  05/09/2012  . Amputation Right 03/30/2014    Procedure: Right Great Toe Amputation at [facility];  Surgeon: Nadara Mustard, MD;  Location: [facility] OR;  Service: Orthopedics;  Laterality: Right;   Social History:  reports that she has never smoked. She has never used smokeless tobacco. She reports that she does not drink alcohol or use illicit drugs.  Allergies  Allergen Reactions  . Ciprofloxacin Other (See Comments)    Leg and feet numbness  . Lidoderm [Lidocaine] Itching, Rash and Other (See Comments)    Burning sensations also  . Ace Inhibitors Other (See Comments)    Poorly tolerated-per PCP  . Angiotensin Receptor Blockers Other (See Comments)    Poorly tolerated-per PCP  . Glucotrol [Glipizide] Nausea Only and Other (See Comments)    Headaches also  . Triple Antibiotic [Bacitracin-Neomycin-Polymyxin] Other (See Comments)    Poorly tolerated  . Neosporin [Neomycin-Bacitracin Zn-Polymyx] Rash  . Sulfa Antibiotics Rash  . Zyrtec [Cetirizine] Itching and Rash    Family History  Problem Relation Age of Onset  . Osteoarthritis Sister   . Osteoarthritis Brother      Prior to Admission medications   Medication Sig Start Date End Date Taking? Authorizing Provider  acetaminophen (TYLENOL) 325 MG tablet Take 2 tablets (650 mg total) by mouth every 6 (six) hours as needed for mild pain or fever. 03/20/14  Yes Richarda Overlie, MD  amiloride-hydrochlorothiazide (MODURETIC) 5-50 MG tablet Take 0.5 tablets by mouth every morning.    Yes Historical Provider, MD  aspirin EC 81 MG tablet Take 81 mg by mouth every morning.    Yes Historical Provider, MD  calcium citrate-vitamin D (CITRACAL+D) 315-200 MG-UNIT per tablet Take 2 tablets by mouth every morning.    Yes Historical Provider, MD    cholecalciferol (VITAMIN D) 1000 UNITS tablet Take 1,000 Units by mouth every morning.    Yes Historical Provider, MD  conjugated estrogens (PREMARIN) vaginal cream Place 1 Applicatorful vaginally 2 (two) times a week. Uses on Tues and Fri   Yes Historical Provider, MD  DULoxetine (CYMBALTA) 30 MG capsule Take 30 mg by mouth every morning.    Yes Historical Provider, MD  fexofenadine (ALLEGRA) 180 MG tablet Take 180 mg by mouth every morning.    Yes Historical Provider, MD  GLUCERNA (GLUCERNA) LIQD Take 237 mLs by mouth 3 (three) times daily. Vanilla only   Yes Historical Provider, MD  insulin aspart (NOVOLOG) 100 UNIT/ML injection Inject 5 Units into the skin 3 (three) times daily before meals.    Yes Historical Provider, MD  insulin glargine (LANTUS) 100 UNIT/ML injection Inject 16 Units into the skin at bedtime.    Yes Historical Provider, MD  iron polysaccharides (NIFEREX) 150 MG capsule Take 150 mg by mouth 2 (two) times daily.   Yes Historical Provider, MD  LORazepam (ATIVAN) 0.5 MG tablet Take 1 tablet (0.5 mg total) by mouth every 12 (twelve) hours. For anxiety. 03/20/14  Yes Richarda Overlie, MD  methylcellulose (ARTIFICIAL TEARS) 1 %  ophthalmic solution Place 1 drop into both eyes daily as needed (for dry eyes).   Yes Historical Provider, MD  morphine (MS CONTIN) 15 MG 12 hr tablet Take 1 tablet (15 mg total) by mouth every 12 (twelve) hours. 03/20/14  Yes Richarda OverlieNayana Abrol, MD  olopatadine (PATANOL) 0.1 % ophthalmic solution Place 1 drop into both eyes 2 (two) times daily as needed (itching/watery eyes). 03/20/14  Yes Richarda OverlieNayana Abrol, MD  omeprazole (PRILOSEC) 20 MG capsule Take 20 mg by mouth every morning.    Yes Historical Provider, MD  oxyCODONE-acetaminophen (PERCOCET/ROXICET) 5-325 MG per tablet Take 1 tablet by mouth every 4 (four) hours as needed for moderate pain or severe pain. 04/02/14  Yes Nadara MustardMarcus Duda V, MD  polyethylene glycol (MIRALAX / GLYCOLAX) packet Take 17 g by mouth daily as needed for  moderate constipation.  03/03/12  Yes Lillia MountainJohn Joseph Griffin, MD  potassium chloride SA (K-DUR,KLOR-CON) 20 MEQ tablet Take 20 mEq by mouth every morning.    Yes Historical Provider, MD  promethazine (PHENERGAN) 12.5 MG tablet Take 12.5 mg by mouth every 6 (six) hours as needed for nausea or vomiting.   Yes Historical Provider, MD  senna-docusate (SENNA-S) 8.6-50 MG per tablet Take 2 tablets by mouth at bedtime.   Yes Historical Provider, MD   Physical Exam: Filed Vitals:   06/01/14 1324 06/01/14 1643  BP: 148/64 139/70  Pulse: 79 72  Temp: 98.1 F (36.7 C) 98.3 F (36.8 C)  TempSrc: Oral Oral  Resp: 16 16  SpO2: 99% 99%    Wt Readings from Last 3 Encounters:  03/30/14 68.04 kg (150 lb)  03/30/14 68.04 kg (150 lb)  03/17/14 68.04 kg (150 lb)    General:  Elderly female lying on gurney in no acute cardiopulmonary distress.  Eyes: PERRLA,EOMI, normal lids, irises & conjunctiva ENT: grossly normal hearing, lips & tongue Neck: no LAD, masses or thyromegaly Cardiovascular: RRR, no m/r/g.  Respiratory: CTA bilaterally, no w/r/r. Normal respiratory effort. Abdomen: soft, ntnd, positive bowel sounds, no rebound, no guarding. Skin: Erythema of the right foot with diffuse swelling and some warmth. Status post right great toe amputation. Musculoskeletal: grossly normal tone BUE. Right foot with diffuse swelling from the foot and some in the lower extremity. Right foot warm to touch with some tenderness to palpation. Status post amputation of right great toe. Right second toe with large ulcer on the volar aspect. Some discoloration. The blackened area on the tip of the left great toe was a mild diffuse redness and swelling. No swelling of the left foot or lower extremity. Psychiatric: grossly normal mood and affect, speech fluent and appropriate Neurologic: Alert oriented x3. Cranial nerves II through XII are grossly intact. No focal deficits.           Labs on Admission:  Basic Metabolic  Panel:  Recent Labs Lab 06/01/14 1442  NA 138  K 4.0  CL 95*  CO2 34*  GLUCOSE 60*  BUN 25*  CREATININE 0.67  CALCIUM 9.9   Liver Function Tests: No results found for this basename: AST, ALT, ALKPHOS, BILITOT, PROT, ALBUMIN,  in the last 168 hours No results found for this basename: LIPASE, AMYLASE,  in the last 168 hours No results found for this basename: AMMONIA,  in the last 168 hours CBC:  Recent Labs Lab 06/01/14 1442  WBC 7.6  HGB 12.9  HCT 40.2  MCV 86.6  PLT 350   Cardiac Enzymes: No results found for this basename: CKTOTAL, CKMB, CKMBINDEX,  TROPONINI,  in the last 168 hours  BNP (last 3 results) No results found for this basename: PROBNP,  in the last 8760 hours CBG: No results found for this basename: GLUCAP,  in the last 168 hours  Radiological Exams on Admission: Dg Foot Complete Right  06/01/2014   CLINICAL DATA:  Swollen and red foot. Diabetes. Initial evaluation.  EXAM: RIGHT FOOT COMPLETE - 3+ VIEW  COMPARISON:  MRI 03/18/2014, right foot series 8 03/2014  FINDINGS: Diffuse soft tissue swelling is present. Vascular calcification is present. Patient has had a prior first digit amputation. Bony erosion noted along the lateral aspect of the distal first metatarsals noted. This is most likely secondary osteomyelitis. This is a new finding.  IMPRESSION: 1. Diffuse soft tissue swelling consistent with cellulitis. 2. Lytic area along the lateral aspect of the distal right first metatarsal consistent with a prominent focus of osteomyelitis . These results will be called to the ordering clinician or representative by the Radiologist Assistant, and communication documented in the PACS or zVision Dashboard.   Electronically Signed   By: Maisie Fus  Register   On: 06/01/2014 15:34   Dg Toe Great Left  06/01/2014   CLINICAL DATA:  Nonhealing wound of the left big toe. Patient is diabetic  EXAM: LEFT GREAT TOE  COMPARISON:  September 23, 2013  FINDINGS: There is no evidence  of fracture or dislocation. There is bony defect in the tip of the first distal phalangeal tuft which appear slightly more irregular compared to the previous exam; this is suspicious for osteomyelitis.  IMPRESSION: There is bony defect in the tip of the first distal phalangeal tuft which appear slightly more irregular compared to the previous exam; this is suspicious for osteomyelitis.   Electronically Signed   By: Sherian Rein M.D.   On: 06/01/2014 15:32    EKG: None  Assessment/Plan Principal Problem:   Cellulitis of right foot Active Problems:   Acute osteomyelitis of toe of left foot   Diabetes mellitus   HTN (hypertension)   Acute osteomyelitis of toe of right foot   GERD (gastroesophageal reflux disease)   Depression   Cellulitis of foot, right  #1 right foot cellulitis/acute osteomyelitis of the left foot/chronic osteomyelitis of the right foot. Patient with worsening cellulitis noted on the right foot with tenderness to palpation and acute changes seen on the right second toe. Plain films which were done consistent with osteomyelitis of left great toe as well as the right foot. Will admit the patient to MedSurg. Check blood cultures x2. Place patient empirically on IV vancomycin IV Zosyn. ED physician spoke with orthopedics to consult on the patient. Spoke with Dr. Lajoyce Corners of orthopedics will be seeing patient in the morning. Follow for now.  #2 diabetes mellitus Check a hemoglobin A1c. Continue home regimen of long-acting insulin. Place on a sliding scale insulin.  #3 hypertension Follow for now.  #4 gastroesophageal reflux disease PPI.  #5 depression Continue home dose Cymbalta.  #6 prophylaxis PPI for GI prophylaxis. Lovenox for DVT prophylaxis.   Code Status: Full DVT Prophylaxis: Lovenox. Family Communication: Updated patient and husband at bedside. Disposition Plan: Admit to MedSurg.  Time spent: 70 minutes  Kenlie Seki M.D. [facility] Pager  [phone]

## 2014-06-01 NOTE — ED Notes (Signed)
Pt from [facility]. Pt reports toe bleeding for the past week. Has had R great toe removed due to diabetes. R foot reddened and swollen. Slight bleeding on R index toe.

## 2014-06-01 NOTE — ED Provider Notes (Signed)
CSN: [id number]     Arrival date & time 06/01/14  1304 History   First MD Initiated Contact with Patient 06/01/14 1455     Chief Complaint  Patient presents with  . Cellulitis  . Toe Pain     (Consider location/radiation/quality/duration/timing/severity/associated sxs/prior Treatment) HPI Patient is here for husband. She reports she was admitted in August and had amputation of her right great toe because of an infection. They report she has had diabetes for at least 25 years. She states they were concerned about her next 2 to have his at that time. She reports she's had swelling in her right foot and leg for "a long time". However the husband thinks in the past few days her foot has started looking worse. He thinks it started turning color the past few days. She has not had a fever that he is aware of. She had nausea and vomiting 2-3 days ago but not since. She also reports she had a blister on her left great toe that was broken open about 2 weeks ago. She does not know who did that. They report she has an appointment with her orthopedist in 3 days however they did not feel like she should wait over the weekend before being seen.  PCP Dr Roseanne Reno Orthopedist Dr Lajoyce Corners  Past Medical History  Diagnosis Date  . Incontinence of urine   . Diarrhea   . Neuropathy     bilateral feet  . GERD (gastroesophageal reflux disease)   . Depression   . Urinary, incontinence, stress female   . History of shingles 2009  . Degenerative lumbar spinal stenosis   . Type II diabetes mellitus     Type 2 IDDM  X 40 years;   . Arthritis     lower back/hands  . Altered mental status 01/13/2013  . Hypertension   . Pneumonia    Past Surgical History  Procedure Laterality Date  . Incontinence surgery  2005  . Tonsillectomy  1964  . Abdominal hysterectomy  1982  . Ovarian cyst surgery  1972  . Cholecystectomy  1988  . Cystoscopy  2009    macroplastique inj  . Rectocele repair  10/15/2011    Procedure:  POSTERIOR REPAIR (RECTOCELE);  Surgeon: Kathi Ludwig, MD;  Location: [facility];  Service: Urology;;  posterior vaginal vault repair with sacral spinus repair with graft  . Cystoscopy  10/15/2011    Procedure: CYSTOSCOPY;  Surgeon: Kathi Ludwig, MD;  Location: [facility];  Service: Urology;  Laterality: N/A;  . Flexible sigmoidoscopy  02/28/2012    Procedure: FLEXIBLE SIGMOIDOSCOPY;  Surgeon: Willis Modena, MD;  Location: [facility];  Service: Endoscopy;  Laterality: N/A;  . Breast biopsy  1990    bilaterally  . Cataract extraction w/ intraocular lens implant    . Posterior fusion lumbar spine  05/09/2012  . Amputation Right 03/30/2014    Procedure: Right Great Toe Amputation at MTP Joint;  Surgeon: Nadara Mustard, MD;  Location: [facility] OR;  Service: Orthopedics;  Laterality: Right;   Family History  Problem Relation Age of Onset  . Osteoarthritis Sister   . Osteoarthritis Brother    History  Substance Use Topics  . Smoking status: Never Smoker   . Smokeless tobacco: Never Used  . Alcohol Use: No   Lives in ALF x 2 years Currently in a wheelchair  OB History   Grav Para Term Preterm Abortions TAB SAB Ect Mult Living  Review of Systems  All other systems reviewed and are negative.     Allergies  Ciprofloxacin; Lidoderm; Ace inhibitors; Angiotensin receptor blockers; Glucotrol; Triple antibiotic; Neosporin; Sulfa antibiotics; and Zyrtec  Home Medications   Prior to Admission medications   Medication Sig Start Date End Date Taking? Authorizing Provider  acetaminophen (TYLENOL) 325 MG tablet Take 2 tablets (650 mg total) by mouth every 6 (six) hours as needed for mild pain or fever. 03/20/14  Yes Richarda OverlieNayana Abrol, MD  amiloride-hydrochlorothiazide (MODURETIC) 5-50 MG tablet Take 0.5 tablets by mouth every morning.    Yes Historical Provider, MD  aspirin EC 81 MG tablet Take 81 mg by mouth every morning.    Yes Historical  Provider, MD  calcium citrate-vitamin D (CITRACAL+D) 315-200 MG-UNIT per tablet Take 2 tablets by mouth every morning.    Yes Historical Provider, MD  cholecalciferol (VITAMIN D) 1000 UNITS tablet Take 1,000 Units by mouth every morning.    Yes Historical Provider, MD  conjugated estrogens (PREMARIN) vaginal cream Place 1 Applicatorful vaginally 2 (two) times a week. Uses on Tues and Fri   Yes Historical Provider, MD  DULoxetine (CYMBALTA) 30 MG capsule Take 30 mg by mouth every morning.    Yes Historical Provider, MD  fexofenadine (ALLEGRA) 180 MG tablet Take 180 mg by mouth every morning.    Yes Historical Provider, MD  GLUCERNA (GLUCERNA) LIQD Take 237 mLs by mouth 3 (three) times daily. Vanilla only   Yes Historical Provider, MD  insulin aspart (NOVOLOG) 100 UNIT/ML injection Inject 5 Units into the skin 3 (three) times daily before meals.    Yes Historical Provider, MD  insulin glargine (LANTUS) 100 UNIT/ML injection Inject 16 Units into the skin at bedtime.    Yes Historical Provider, MD  iron polysaccharides (NIFEREX) 150 MG capsule Take 150 mg by mouth 2 (two) times daily.   Yes Historical Provider, MD  LORazepam (ATIVAN) 0.5 MG tablet Take 1 tablet (0.5 mg total) by mouth every 12 (twelve) hours. For anxiety. 03/20/14  Yes Richarda OverlieNayana Abrol, MD  methylcellulose (ARTIFICIAL TEARS) 1 % ophthalmic solution Place 1 drop into both eyes daily as needed (for dry eyes).   Yes Historical Provider, MD  morphine (MS CONTIN) 15 MG 12 hr tablet Take 1 tablet (15 mg total) by mouth every 12 (twelve) hours. 03/20/14  Yes Richarda OverlieNayana Abrol, MD  olopatadine (PATANOL) 0.1 % ophthalmic solution Place 1 drop into both eyes 2 (two) times daily as needed (itching/watery eyes). 03/20/14  Yes Richarda OverlieNayana Abrol, MD  omeprazole (PRILOSEC) 20 MG capsule Take 20 mg by mouth every morning.    Yes Historical Provider, MD  oxyCODONE-acetaminophen (PERCOCET/ROXICET) 5-325 MG per tablet Take 1 tablet by mouth every 4 (four) hours as needed  for moderate pain or severe pain. 04/02/14  Yes Nadara MustardMarcus Duda V, MD  polyethylene glycol (MIRALAX / GLYCOLAX) packet Take 17 g by mouth daily as needed for moderate constipation.  03/03/12  Yes Lillia MountainJohn Joseph Griffin, MD  potassium chloride SA (K-DUR,KLOR-CON) 20 MEQ tablet Take 20 mEq by mouth every morning.    Yes Historical Provider, MD  promethazine (PHENERGAN) 12.5 MG tablet Take 12.5 mg by mouth every 6 (six) hours as needed for nausea or vomiting.   Yes Historical Provider, MD  senna-docusate (SENNA-S) 8.6-50 MG per tablet Take 2 tablets by mouth at bedtime.   Yes Historical Provider, MD   BP 148/64  Pulse 79  Temp(Src) 98.1 F (36.7 C) (Oral)  Resp 16  SpO2 99%  Vital  signs normal   Physical Exam  Nursing note and vitals reviewed. Constitutional: She is oriented to person, place, and time. She appears well-developed and well-nourished.  Non-toxic appearance. She does not appear ill. No distress.  HENT:  Head: Normocephalic and atraumatic.  Right Ear: External ear normal.  Left Ear: External ear normal.  Nose: Nose normal. No mucosal edema or rhinorrhea.  Mouth/Throat: Oropharynx is clear and moist and mucous membranes are normal. No dental abscesses or uvula swelling.  Eyes: Conjunctivae and EOM are normal. Pupils are equal, round, and reactive to light.  Neck: Normal range of motion and full passive range of motion without pain. Neck supple.  Cardiovascular: Normal rate, regular rhythm and normal heart sounds.  Exam reveals no gallop and no friction rub.   No murmur heard. Pulmonary/Chest: Effort normal and breath sounds normal. No respiratory distress. She has no wheezes. She has no rhonchi. She has no rales. She exhibits no tenderness and no crepitus.  Abdominal: Soft. Normal appearance and bowel sounds are normal. She exhibits no distension. There is no tenderness. There is no rebound and no guarding.  Musculoskeletal: Normal range of motion. She exhibits no edema and no  tenderness.  Patient has diffuse swelling of her right lower extremity. She has moderate swelling of her right foot and the lower right leg. There's warmth to touch. Her right great toe is gone. She has a well-healed surgical wound. Her second toe has a large ulcer on the volar aspect that appears to be just through the dermis. The toe is getting some discoloration. Please see photo  Patient also has a blackened area on the tip of her left great toe with some mild diffuse redness and swelling of the toe. There is no swelling of the foot or lower extremity on the left. Please see photo  Neurological: She is alert and oriented to person, place, and time. She has normal strength. No cranial nerve deficit.  Patient appears to have some mild confusion.  Skin: Skin is warm, dry and intact. No rash noted. No erythema. No pallor.  Psychiatric: She has a normal mood and affect. Her speech is normal and behavior is normal. Her mood appears not anxious.          ED Course  Procedures (including critical care time)  Antibiotics were not started waiting to see if Dr Lajoyce Cornersuda was going to do a debridement to get a deep tissue sample for culture.   16:38 Dr Lajoyce Cornersuda has looked at her xrays and her pictures, will see in the am. Wants ABI's done.   16:47 Dr Janee Mornhompson, will admit med-surg   Labs Review Results for orders placed during the hospital encounter of 06/01/14  CBC      Result Value Ref Range   WBC 7.6  4.0 - 10.5 K/uL   RBC 4.64  3.87 - 5.11 MIL/uL   Hemoglobin 12.9  12.0 - 15.0 g/dL   HCT 08.640.2  57.836.0 - 46.946.0 %   MCV 86.6  78.0 - 100.0 fL   MCH 27.8  26.0 - 34.0 pg   MCHC 32.1  30.0 - 36.0 g/dL   RDW 62.913.5  52.811.5 - 41.315.5 %   Platelets 350  150 - 400 K/uL  BASIC METABOLIC PANEL      Result Value Ref Range   Sodium 138  137 - 147 mEq/L   Potassium 4.0  3.7 - 5.3 mEq/L   Chloride 95 (*) 96 - 112 mEq/L   CO2 34 (*)  19 - 32 mEq/L   Glucose, Bld 60 (*) 70 - 99 mg/dL   BUN 25 (*) 6 - 23 mg/dL    Creatinine, Ser 1.61  0.50 - 1.10 mg/dL   Calcium 9.9  8.4 - 09.6 mg/dL   GFR calc non Af Amer 81 (*) >90 mL/min   GFR calc Af Amer >90  >90 mL/min   Anion gap 9  5 - 15  SEDIMENTATION RATE      Result Value Ref Range   Sed Rate 62 (*) 0 - 22 mm/hr  C-REACTIVE PROTEIN      Result Value Ref Range   CRP 4.4 (*) <0.60 mg/dL  ALBUMIN      Result Value Ref Range   Albumin 3.5  3.5 - 5.2 g/dL  MAGNESIUM      Result Value Ref Range   Magnesium 2.0  1.5 - 2.5 mg/dL  GLUCOSE, CAPILLARY      Result Value Ref Range   Glucose-Capillary 124 (*) 70 - 99 mg/dL  GLUCOSE, CAPILLARY      Result Value Ref Range   Glucose-Capillary 252 (*) 70 - 99 mg/dL   Laboratory interpretation all normal except hypoglycemia, metabolic alkalosis    Imaging Review Dg Foot Complete Right  06/01/2014   CLINICAL DATA:  Swollen and red foot. Diabetes. Initial evaluation.  EXAM: RIGHT FOOT COMPLETE - 3+ VIEW  COMPARISON:  MRI 03/18/2014, right foot series 8 03/2014  FINDINGS: Diffuse soft tissue swelling is present. Vascular calcification is present. Patient has had a prior first digit amputation. Bony erosion noted along the lateral aspect of the distal first metatarsals noted. This is most likely secondary osteomyelitis. This is a new finding.  IMPRESSION: 1. Diffuse soft tissue swelling consistent with cellulitis. 2. Lytic area along the lateral aspect of the distal right first metatarsal consistent with a prominent focus of osteomyelitis . These results will be called to the ordering clinician or representative by the Radiologist Assistant, and communication documented in the PACS or zVision Dashboard.   Electronically Signed   By: Maisie Fus  Register   On: 06/01/2014 15:34   Dg Toe Great Left  06/01/2014   CLINICAL DATA:  Nonhealing wound of the left big toe. Patient is diabetic  EXAM: LEFT GREAT TOE  COMPARISON:  September 23, 2013  FINDINGS: There is no evidence of fracture or dislocation. There is bony defect in the  tip of the first distal phalangeal tuft which appear slightly more irregular compared to the previous exam; this is suspicious for osteomyelitis.  IMPRESSION: There is bony defect in the tip of the first distal phalangeal tuft which appear slightly more irregular compared to the previous exam; this is suspicious for osteomyelitis.   Electronically Signed   By: Sherian Rein M.D.   On: 06/01/2014 15:32     EKG Interpretation None      MDM   Final diagnoses:  Pain and swelling of toe, left  Swelling of foot joint, right  Osteomyelitis of toe of right foot  Acute osteomyelitis of toe, left  Cellulitis of foot, right  Hypoglycemia    Plan admission  Devoria Albe, MD, Armando Gang     Ward Givens, MD 06/01/14 [phone]

## 2014-06-02 DIAGNOSIS — M86171 Other acute osteomyelitis, right ankle and foot: Principal | ICD-10-CM

## 2014-06-02 DIAGNOSIS — I739 Peripheral vascular disease, unspecified: Secondary | ICD-10-CM

## 2014-06-02 LAB — CBC
HCT: 33.3 % — ABNORMAL LOW (ref 36.0–46.0)
Hemoglobin: 11.1 g/dL — ABNORMAL LOW (ref 12.0–15.0)
MCH: 28 pg (ref 26.0–34.0)
MCHC: 33.3 g/dL (ref 30.0–36.0)
MCV: 84.1 fL (ref 78.0–100.0)
Platelets: 264 10*3/uL (ref 150–400)
RBC: 3.96 MIL/uL (ref 3.87–5.11)
RDW: 13.3 % (ref 11.5–15.5)
WBC: 5 10*3/uL (ref 4.0–10.5)

## 2014-06-02 LAB — COMPREHENSIVE METABOLIC PANEL
ALT: 8 U/L (ref 0–35)
AST: 13 U/L (ref 0–37)
Albumin: 2.8 g/dL — ABNORMAL LOW (ref 3.5–5.2)
Alkaline Phosphatase: 112 U/L (ref 39–117)
Anion gap: 8 (ref 5–15)
BUN: 16 mg/dL (ref 6–23)
CO2: 30 mEq/L (ref 19–32)
Calcium: 8.8 mg/dL (ref 8.4–10.5)
Chloride: 100 mEq/L (ref 96–112)
Creatinine, Ser: 0.56 mg/dL (ref 0.50–1.10)
GFR calc Af Amer: >90 mL/min (ref >90)
GFR calc non Af Amer: 86 mL/min — ABNORMAL LOW (ref >90)
Glucose, Bld: 152 mg/dL — ABNORMAL HIGH (ref 70–99)
Potassium: 3.8 mEq/L (ref 3.7–5.3)
Sodium: 138 mEq/L (ref 137–147)
Total Bilirubin: 0.3 mg/dL (ref 0.3–1.2)
Total Protein: 6.2 g/dL (ref 6.0–8.3)

## 2014-06-02 LAB — GLUCOSE, CAPILLARY
Glucose-Capillary: 147 mg/dL — ABNORMAL HIGH (ref 70–99)
Glucose-Capillary: 147 mg/dL — ABNORMAL HIGH (ref 70–99)
Glucose-Capillary: 126 mg/dL — ABNORMAL HIGH (ref 70–99)
Glucose-Capillary: 252 mg/dL — ABNORMAL HIGH (ref 70–99)

## 2014-06-02 LAB — PROTIME-INR
INR: 1.11 (ref 0.00–1.49)
Prothrombin Time: 14.4 seconds (ref 11.6–15.2)

## 2014-06-02 LAB — HEMOGLOBIN A1C
Hgb A1c MFr Bld: 5.9 % — ABNORMAL HIGH (ref <5.7)
Mean Plasma Glucose: 123 mg/dL — ABNORMAL HIGH (ref <117)

## 2014-06-02 LAB — APTT: APTT: 37 s (ref 24–37)

## 2014-06-02 MED ORDER — CHLORHEXIDINE GLUCONATE 4 % EX LIQD
60.0000 mL | Freq: Once | CUTANEOUS | Status: AC
  Administered 2014-06-03: 4 via TOPICAL
  Filled 2014-06-02: qty 60

## 2014-06-02 MED ORDER — CEFAZOLIN SODIUM-DEXTROSE 2-3 GM-% IV SOLR
2.0000 g | INTRAVENOUS | Status: DC
  Filled 2014-06-02: qty 50

## 2014-06-02 NOTE — Consult Note (Signed)
Reason for Consult: Osteomyelitis left foot great toe and right foot second toe Referring Physician: Dr. [name] is an 80 y.o. female.  HPI: Patient is a 80 year old woman with peripheral vascular disease and diabetes. Patient is status post amputation of the right toe she presents at this time with cellulitis a sending from the second toe right foot and a necrotic ulcer over the tip of the great toe left foot  Past Medical History  Diagnosis Date  . Incontinence of urine   . Diarrhea   . Neuropathy     bilateral feet  . GERD (gastroesophageal reflux disease)   . Depression   . Urinary, incontinence, stress female   . History of shingles 2009  . Degenerative lumbar spinal stenosis   . Type II diabetes mellitus     Type 2 IDDM  X 40 years;   . Arthritis     lower back/hands  . Altered mental status 01/13/2013  . Hypertension   . Pneumonia     Past Surgical History  Procedure Laterality Date  . Incontinence surgery  2005  . Tonsillectomy  1964  . Abdominal hysterectomy  1982  . Ovarian cyst surgery  1972  . Cholecystectomy  1988  . Cystoscopy  2009    macroplastique inj  . Rectocele repair  10/15/2011    Procedure: POSTERIOR REPAIR (RECTOCELE);  Surgeon: Ailene Rud, MD;  Location: [facility];  Service: Urology;;  posterior vaginal vault repair with sacral spinus repair with graft  . Cystoscopy  10/15/2011    Procedure: CYSTOSCOPY;  Surgeon: Ailene Rud, MD;  Location: [facility];  Service: Urology;  Laterality: N/A;  . Flexible sigmoidoscopy  02/28/2012    Procedure: FLEXIBLE SIGMOIDOSCOPY;  Surgeon: Arta Silence, MD;  Location: [facility];  Service: Endoscopy;  Laterality: N/A;  . Breast biopsy  1990    bilaterally  . Cataract extraction w/ intraocular lens implant    . Posterior fusion lumbar spine  05/09/2012  . Amputation Right 03/30/2014    Procedure: Right Great Toe Amputation at MTP Joint;  Surgeon:  Newt Minion, MD;  Location: [facility];  Service: Orthopedics;  Laterality: Right;    Family History  Problem Relation Age of Onset  . Osteoarthritis Sister   . Osteoarthritis Brother     Social History:  reports that she has never smoked. She has never used smokeless tobacco. She reports that she does not drink alcohol or use illicit drugs.  Allergies:  Allergies  Allergen Reactions  . Ciprofloxacin Other (See Comments)    Leg and feet numbness  . Lidoderm [Lidocaine] Itching, Rash and Other (See Comments)    Burning sensations also  . Ace Inhibitors Other (See Comments)    Poorly tolerated-per PCP  . Angiotensin Receptor Blockers Other (See Comments)    Poorly tolerated-per PCP  . Glucotrol [Glipizide] Nausea Only and Other (See Comments)    Headaches also  . Triple Antibiotic [Bacitracin-Neomycin-Polymyxin] Other (See Comments)    Poorly tolerated  . Neosporin [Neomycin-Bacitracin Zn-Polymyx] Rash  . Sulfa Antibiotics Rash  . Zyrtec [Cetirizine] Itching and Rash    Medications: I have reviewed the patient's current medications.  Results for orders placed during the hospital encounter of 06/01/14 (from the past 48 hour(s))  MAGNESIUM     Status: None   Collection Time    06/01/14  2:40 PM      Result Value Ref Range   Magnesium 2.0  1.5 -  2.5 mg/dL  CBC     Status: None   Collection Time    06/01/14  2:42 PM      Result Value Ref Range   WBC 7.6  4.0 - 10.5 K/uL   RBC 4.64  3.87 - 5.11 MIL/uL   Hemoglobin 12.9  12.0 - 15.0 g/dL   HCT 40.2  36.0 - 46.0 %   MCV 86.6  78.0 - 100.0 fL   MCH 27.8  26.0 - 34.0 pg   MCHC 32.1  30.0 - 36.0 g/dL   RDW 13.5  11.5 - 15.5 %   Platelets 350  150 - 400 K/uL  BASIC METABOLIC PANEL     Status: Abnormal   Collection Time    06/01/14  2:42 PM      Result Value Ref Range   Sodium 138  137 - 147 mEq/L   Potassium 4.0  3.7 - 5.3 mEq/L   Chloride 95 (*) 96 - 112 mEq/L   CO2 34 (*) 19 - 32 mEq/L   Glucose, Bld 60 (*) 70 - 99 mg/dL    BUN 25 (*) 6 - 23 mg/dL   Creatinine, Ser 0.67  0.50 - 1.10 mg/dL   Calcium 9.9  8.4 - 10.5 mg/dL   GFR calc non Af Amer 81 (*) >90 mL/min   GFR calc Af Amer >90  >90 mL/min   Comment: (NOTE)     The eGFR has been calculated using the CKD EPI equation.     This calculation has not been validated in all clinical situations.     eGFR's persistently <90 mL/min signify possible Chronic Kidney     Disease.   Anion gap 9  5 - 15  HEMOGLOBIN A1C     Status: Abnormal   Collection Time    06/01/14  3:33 PM      Result Value Ref Range   Hemoglobin A1C 5.9 (*) <5.7 %   Comment: (NOTE)                                                                               According to the ADA Clinical Practice Recommendations for 2011, when     HbA1c is used as a screening test:      >=6.5%   Diagnostic of Diabetes Mellitus               (if abnormal result is confirmed)     5.7-6.4%   Increased risk of developing Diabetes Mellitus     References:Diagnosis and Classification of Diabetes Mellitus,Diabetes     HWTU,8828,00(LKJZP 1):S62-S69 and Standards of Medical Care in             Diabetes - 2011,Diabetes Care,2011,34 (Suppl 1):S11-S61.   Mean Plasma Glucose 123 (*) <117 mg/dL   Comment: Performed at [place]     Status: None   Collection Time    06/01/14  3:33 PM      Result Value Ref Range   Albumin 3.5  3.5 - 5.2 g/dL  SEDIMENTATION RATE     Status: Abnormal   Collection Time    06/01/14  3:43 PM      Result Value  Ref Range   Sed Rate 62 (*) 0 - 22 mm/hr  C-REACTIVE PROTEIN     Status: Abnormal   Collection Time    06/01/14  3:43 PM      Result Value Ref Range   CRP 4.4 (*) <0.60 mg/dL   Comment: Performed at [place], [place]     Status: Abnormal   Collection Time    06/01/14  6:44 PM      Result Value Ref Range   Glucose-Capillary 124 (*) 70 - 99 mg/dL  GLUCOSE, CAPILLARY     Status: Abnormal   Collection Time    06/01/14  9:04 PM       Result Value Ref Range   Glucose-Capillary 252 (*) 70 - 99 mg/dL  COMPREHENSIVE METABOLIC PANEL     Status: Abnormal   Collection Time    06/02/14  4:44 AM      Result Value Ref Range   Sodium 138  137 - 147 mEq/L   Potassium 3.8  3.7 - 5.3 mEq/L   Chloride 100  96 - 112 mEq/L   CO2 30  19 - 32 mEq/L   Glucose, Bld 152 (*) 70 - 99 mg/dL   BUN 16  6 - 23 mg/dL   Creatinine, Ser 0.56  0.50 - 1.10 mg/dL   Calcium 8.8  8.4 - 10.5 mg/dL   Total Protein 6.2  6.0 - 8.3 g/dL   Albumin 2.8 (*) 3.5 - 5.2 g/dL   AST 13  0 - 37 U/L   ALT 8  0 - 35 U/L   Alkaline Phosphatase 112  39 - 117 U/L   Total Bilirubin 0.3  0.3 - 1.2 mg/dL   GFR calc non Af Amer 86 (*) >90 mL/min   GFR calc Af Amer >90  >90 mL/min   Comment: (NOTE)     The eGFR has been calculated using the CKD EPI equation.     This calculation has not been validated in all clinical situations.     eGFR's persistently <90 mL/min signify possible Chronic Kidney     Disease.   Anion gap 8  5 - 15  CBC     Status: Abnormal   Collection Time    06/02/14  4:44 AM      Result Value Ref Range   WBC 5.0  4.0 - 10.5 K/uL   RBC 3.96  3.87 - 5.11 MIL/uL   Hemoglobin 11.1 (*) 12.0 - 15.0 g/dL   HCT 33.3 (*) 36.0 - 46.0 %   MCV 84.1  78.0 - 100.0 fL   MCH 28.0  26.0 - 34.0 pg   MCHC 33.3  30.0 - 36.0 g/dL   RDW 13.3  11.5 - 15.5 %   Platelets 264  150 - 400 K/uL   Comment: RESULT REPEATED AND VERIFIED     DELTA CHECK NOTED  PROTIME-INR     Status: None   Collection Time    06/02/14  4:44 AM      Result Value Ref Range   Prothrombin Time 14.4  11.6 - 15.2 seconds   INR 1.11  0.00 - 1.49  APTT     Status: None   Collection Time    06/02/14  4:44 AM      Result Value Ref Range   aPTT 37  24 - 37 seconds   Comment:            IF BASELINE aPTT IS ELEVATED,  SUGGEST PATIENT RISK ASSESSMENT     BE USED TO DETERMINE APPROPRIATE     ANTICOAGULANT THERAPY.  GLUCOSE, CAPILLARY     Status: Abnormal   Collection Time    06/02/14   5:50 AM      Result Value Ref Range   Glucose-Capillary 147 (*) 70 - 99 mg/dL  GLUCOSE, CAPILLARY     Status: Abnormal   Collection Time    06/02/14  7:26 AM      Result Value Ref Range   Glucose-Capillary 147 (*) 70 - 99 mg/dL  GLUCOSE, CAPILLARY     Status: Abnormal   Collection Time    06/02/14 11:33 AM      Result Value Ref Range   Glucose-Capillary 126 (*) 70 - 99 mg/dL    Dg Foot Complete Right  06/01/2014   CLINICAL DATA:  Swollen and red foot. Diabetes. Initial evaluation.  EXAM: RIGHT FOOT COMPLETE - 3+ VIEW  COMPARISON:  MRI 03/18/2014, right foot series 8 03/2014  FINDINGS: Diffuse soft tissue swelling is present. Vascular calcification is present. Patient has had a prior first digit amputation. Bony erosion noted along the lateral aspect of the distal first metatarsals noted. This is most likely secondary osteomyelitis. This is a new finding.  IMPRESSION: 1. Diffuse soft tissue swelling consistent with cellulitis. 2. Lytic area along the lateral aspect of the distal right first metatarsal consistent with a prominent focus of osteomyelitis . These results will be called to the ordering clinician or representative by the Radiologist Assistant, and communication documented in the PACS or zVision Dashboard.   Electronically Signed   By: Marcello Moores  Register   On: 06/01/2014 15:34   Dg Toe Great Left  06/01/2014   CLINICAL DATA:  Nonhealing wound of the left big toe. Patient is diabetic  EXAM: LEFT GREAT TOE  COMPARISON:  September 23, 2013  FINDINGS: There is no evidence of fracture or dislocation. There is bony defect in the tip of the first distal phalangeal tuft which appear slightly more irregular compared to the previous exam; this is suspicious for osteomyelitis.  IMPRESSION: There is bony defect in the tip of the first distal phalangeal tuft which appear slightly more irregular compared to the previous exam; this is suspicious for osteomyelitis.   Electronically Signed   By: Abelardo Diesel M.D.   On: 06/01/2014 15:32    Review of Systems  All other systems reviewed and are negative.  Blood pressure 154/65, pulse 95, temperature 99 F (37.2 C), temperature source Oral, resp. rate 16, height _0  (1.626 m), weight 68 kg (149 lb 14.6 oz), SpO2 98.00%. Physical Exam On examination patient has a faintly palpable dorsalis pedis pulse ankle brachial indices were obtained which shows an ankle-brachial indices of about 1 with monophasic flow which is indicated a false with elevated ABIs. Patient has cellulitis of the second toe right foot and a gangrenous ulcer of the great toe left foot. Radiographs show destructive changes of the tuft of the great toe left foot. Assessment/Plan: Assessment: Peripheral vascular disease diabetes with cellulitis and osteomyelitis of the right foot second toe and osteomyelitis of the left foot great toe.  Plan: We'll plan for amputation of the second toe right foot and great toe left foot. Discussed with the patient and her family risks of the wound nonhealing potential for additional surgery. Patient and family state they understand and wish to proceed at this time we will try to set up surgery for Sunday morning.  [name]  V 06/02/2014, 3:37 PM

## 2014-06-02 NOTE — Progress Notes (Addendum)
VASCULAR LAB PRELIMINARY  ARTERIAL  ABI completed:    RIGHT    LEFT    PRESSURE WAVEFORM  PRESSURE WAVEFORM  BRACHIAL 134 Triphasic BRACHIAL 135 Triphasic  DP 121 Monophasic DP 159 Monophasic to Biphasic  PT 131 Monophasic PT 148 Biphasic  2nd TOE 91 NA GREAT TOE 96 NA    RIGHT LEFT  ABI / TBI 0.97 / 0.67 1.18 / 0.71   ABIs appear to be within normal limits however Doppler waveforms on the right may suggest a false elevation of pressures due to calcification as with the TBI on the right. The TBI on the right was calculated using the second toe due to amputation of the great toe.  Shacoya Burkhammer, RVS 06/02/2014, 8:44 AM

## 2014-06-02 NOTE — Progress Notes (Signed)
OT Cancellation Note  Patient Details Name: [name] MRN: [id number] DOB: 07/16/1933   Cancelled Treatment:    Reason Eval/Treat Not Completed: Other (comment) Pt is awaiting ortho consult.  Will check back later today or tomorrow.  [name] 06/02/2014, 1:30 PM [name], OTR/L [phone] 06/02/2014

## 2014-06-02 NOTE — Progress Notes (Signed)
[facility] [address] via [facility] for surgery by Dr. Lajoyce Cornersuda in AM.  Report called to Ander SladeJoy, RN.  Notified [facility] of need to transfer.  Dorothyann PengKim Lilyauna Miedema RN

## 2014-06-02 NOTE — Progress Notes (Signed)
[facility] PROGRESS NOTE  Tonya ChildsBertha L [name] WJX:[id number] DOB: 11/28/1932 DOA: 06/01/2014 PCP: Lillia MountainGRIFFIN,JOHN JOSEPH, MD  Assessment/Plan: #1 right foot cellulitis/acute osteomyelitis of the left great toe/chronic osteomyelitis of the right foot  Sedimentation rate and CRP elevated. Patient status post ABIs/TBI. Cellulitis improving. Continue empiric IV vancomycin IV Zosyn. Orthopedic consultation pending.  #2 well controlled diabetes mellitus Hemoglobin A1c 5.9. CBGs are ranging from 126 -147. Continue home dose Lantus and sliding scale insulin. Will discontinue meal coverage insulin.  #3 hypertension Stable. Follow.  #4 gastroesophageal reflux disease PPI.  #5 depression Continue Cymbalta  #6 prophylaxis PPI for GI prophylaxis. Lovenox for DVT prophylaxis.  Code Status: Full Family Communication: Updated patient and daughter at bedside. Disposition Plan: Pending orthopedics assessment.   Consultants:  Orthopedics: Dr. Lajoyce Cornersuda pending  Procedures:  X-ray of the right foot 06/01/2014  X-ray of the left great toe 06/01/2014  ABIs/TBI 06/02/2014  Antibiotics:  IV vancomycin 06/01/2014  IV Zosyn 06/01/2014  HPI/Subjective: Patient states she is just worried. No complaints. No chest pain. No shortness of breath.  Objective: Filed Vitals:   06/02/14 1340  BP: 154/65  Pulse: 95  Temp: 99 F (37.2 C)  Resp: 16    Intake/Output Summary (Last 24 hours) at 06/02/14 1519 Last data filed at 06/02/14 1100  Gross per 24 hour  Intake    240 ml  Output      0 ml  Net    240 ml   Filed Weights   06/01/14 2113  Weight: 68 kg (149 lb 14.6 oz)    Exam:   General:  NAD  Cardiovascular: RRR  Respiratory: CTAB ANTERIOR LUNG FIELDS  Abdomen: Soft, nontender, nondistended, positive bowel sounds.  Musculoskeletal: Status post amputation of right great toe. Swelling and erythema and warmth in right foot with significant improvement. Right second toe with large  ulcer as well as blackened area on the tip of the toe. Blackened area on the tip of the left great toe.  Data Reviewed: Basic Metabolic Panel:  Recent Labs Lab 06/01/14 1440 06/01/14 1442 06/02/14 0444  NA  --  138 138  K  --  4.0 3.8  CL  --  95* 100  CO2  --  34* 30  GLUCOSE  --  60* 152*  BUN  --  25* 16  CREATININE  --  0.67 0.56  CALCIUM  --  9.9 8.8  MG 2.0  --   --    Liver Function Tests:  Recent Labs Lab 06/01/14 1533 06/02/14 0444  AST  --  13  ALT  --  8  ALKPHOS  --  112  BILITOT  --  0.3  PROT  --  6.2  ALBUMIN 3.5 2.8*   No results found for this basename: LIPASE, AMYLASE,  in the last 168 hours No results found for this basename: AMMONIA,  in the last 168 hours CBC:  Recent Labs Lab 06/01/14 1442 06/02/14 0444  WBC 7.6 5.0  HGB 12.9 11.1*  HCT 40.2 33.3*  MCV 86.6 84.1  PLT 350 264   Cardiac Enzymes: No results found for this basename: CKTOTAL, CKMB, CKMBINDEX, TROPONINI,  in the last 168 hours BNP (last 3 results) No results found for this basename: PROBNP,  in the last 8760 hours CBG:  Recent Labs Lab 06/01/14 1844 06/01/14 2104 06/02/14 0550 06/02/14 0726 06/02/14 1133  GLUCAP 124* 252* 147* 147* 126*    No results found for this or any previous visit (from the past  240 hour(s)).   Studies: Dg Foot Complete Right  06/01/2014   CLINICAL DATA:  Swollen and red foot. Diabetes. Initial evaluation.  EXAM: RIGHT FOOT COMPLETE - 3+ VIEW  COMPARISON:  MRI 03/18/2014, right foot series 8 03/2014  FINDINGS: Diffuse soft tissue swelling is present. Vascular calcification is present. Patient has had a prior first digit amputation. Bony erosion noted along the lateral aspect of the distal first metatarsals noted. This is most likely secondary osteomyelitis. This is a new finding.  IMPRESSION: 1. Diffuse soft tissue swelling consistent with cellulitis. 2. Lytic area along the lateral aspect of the distal right first metatarsal consistent with a  prominent focus of osteomyelitis . These results will be called to the ordering clinician or representative by the Radiologist Assistant, and communication documented in the PACS or zVision Dashboard.   Electronically Signed   By: Maisie Fushomas  Register   On: 06/01/2014 15:34   Dg Toe Great Left  06/01/2014   CLINICAL DATA:  Nonhealing wound of the left big toe. Patient is diabetic  EXAM: LEFT GREAT TOE  COMPARISON:  September 23, 2013  FINDINGS: There is no evidence of fracture or dislocation. There is bony defect in the tip of the first distal phalangeal tuft which appear slightly more irregular compared to the previous exam; this is suspicious for osteomyelitis.  IMPRESSION: There is bony defect in the tip of the first distal phalangeal tuft which appear slightly more irregular compared to the previous exam; this is suspicious for osteomyelitis.   Electronically Signed   By: Sherian ReinWei-Chen  Lin M.D.   On: 06/01/2014 15:32    Scheduled Meds: . aspirin EC  81 mg Oral q morning - 10a  . calcium-vitamin D  1 tablet Oral Q breakfast  . cholecalciferol  1,000 Units Oral q morning - 10a  . [START ON 06/04/2014] conjugated estrogens  1 Applicatorful Vaginal Once per day on Mon Thu  . DULoxetine  30 mg Oral q morning - 10a  . enoxaparin (LOVENOX) injection  40 mg Subcutaneous Q24H  . feeding supplement (GLUCERNA SHAKE)  237 mL Oral TID BM  . insulin aspart  0-9 Units Subcutaneous TID WC  . insulin glargine  16 Units Subcutaneous QHS  . iron polysaccharides  150 mg Oral BID  . loratadine  10 mg Oral Daily  . LORazepam  0.5 mg Oral Q12H  . morphine  15 mg Oral Q12H  . pantoprazole  40 mg Oral Q0600  . piperacillin-tazobactam (ZOSYN)  IV  3.375 g Intravenous Q8H  . potassium chloride SA  20 mEq Oral q morning - 10a  . senna-docusate  2 tablet Oral QHS  . vancomycin  500 mg Intravenous Q12H   Continuous Infusions: . sodium chloride 0.9 % 1,000 mL infusion 75 mL/hr at 06/02/14 [id number]    Principal Problem:    Cellulitis of right foot Active Problems:   Acute osteomyelitis of toe of left foot   Diabetes mellitus   HTN (hypertension)   Acute osteomyelitis of toe of right foot   GERD (gastroesophageal reflux disease)   Depression   Cellulitis of foot, right    Time spent: 4435 MINS    [facility] Center[facility]OMPS[facility],Marten Iles MD [facility] Pager [phone]. If 7PM-7AM, please contact night-coverage at [email or web address], password [email or web address] 06/02/2014, 3:19 PM  LOS: 1 day

## 2014-06-02 NOTE — Progress Notes (Signed)
PT Cancellation Note  Patient Details Name: [name] MRN: [id number] DOB: 08/26/1932   Cancelled Treatment:     PT deferred pending Ortho consult .  Per Dr Audrie Liauda's note, pt for surgery in am. Please reorder PT as needed post op.   [name] 06/02/2014, 4:45 PM

## 2014-06-03 ENCOUNTER — Encounter (HOSPITAL_COMMUNITY)
Admission: EM | Disposition: A | Discharge status: Home Health | Payer: Self-pay | Source: Home / Self Care | Attending: Internal Medicine

## 2014-06-03 ENCOUNTER — Inpatient Hospital Stay (HOSPITAL_COMMUNITY): Payer: Medicare Other | Admitting: Anesthesiology

## 2014-06-03 ENCOUNTER — Encounter (HOSPITAL_COMMUNITY): Payer: Medicare Other | Admitting: Anesthesiology

## 2014-06-03 DIAGNOSIS — M869 Osteomyelitis, unspecified: Secondary | ICD-10-CM

## 2014-06-03 HISTORY — PX: AMPUTATION: SHX166

## 2014-06-03 LAB — GLUCOSE, CAPILLARY
GLUCOSE-CAPILLARY: 162 mg/dL — AB (ref 70–99)
GLUCOSE-CAPILLARY: 141 mg/dL — AB (ref 70–99)
GLUCOSE-CAPILLARY: 223 mg/dL — AB (ref 70–99)
Glucose-Capillary: 98 mg/dL (ref 70–99)
Glucose-Capillary: 186 mg/dL — ABNORMAL HIGH (ref 70–99)

## 2014-06-03 LAB — SURGICAL PCR SCREEN
MRSA, PCR: POSITIVE — AB
Staphylococcus aureus: POSITIVE — AB

## 2014-06-03 SURGERY — AMPUTATION DIGIT
Anesthesia: Monitor Anesthesia Care | Site: Toe

## 2014-06-03 MED ORDER — SODIUM CHLORIDE 0.9 % IV SOLN
INTRAVENOUS | Status: DC
  Administered 2014-06-04: via INTRAVENOUS

## 2014-06-03 MED ORDER — HYDROMORPHONE HCL 1 MG/ML IJ SOLN
0.5000 mg | INTRAMUSCULAR | Status: DC | PRN
  Administered 2014-06-03: 1 mg via INTRAVENOUS
  Filled 2014-06-03: qty 1

## 2014-06-03 MED ORDER — METOCLOPRAMIDE HCL 10 MG PO TABS: 5.0000 mg | ORAL_TABLET | Freq: Three times a day (TID) | ORAL | Status: DC | PRN

## 2014-06-03 MED ORDER — METOCLOPRAMIDE HCL 5 MG/ML IJ SOLN: 5.0000 mg | Freq: Three times a day (TID) | INTRAMUSCULAR | Status: DC | PRN

## 2014-06-03 MED ORDER — MIDAZOLAM HCL 2 MG/2ML IJ SOLN
INTRAMUSCULAR | Status: AC
  Filled 2014-06-03: qty 2

## 2014-06-03 MED ORDER — 0.9 % SODIUM CHLORIDE (POUR BTL) OPTIME
TOPICAL | Status: DC | PRN
  Administered 2014-06-03: 1000 mL

## 2014-06-03 MED ORDER — METHOCARBAMOL 1000 MG/10ML IJ SOLN
500.0000 mg | Freq: Four times a day (QID) | INTRAVENOUS | Status: DC | PRN
  Filled 2014-06-03: qty 5

## 2014-06-03 MED ORDER — OXYCODONE HCL 5 MG PO TABS: 5.0000 mg | ORAL_TABLET | Freq: Once | ORAL | Status: DC | PRN

## 2014-06-03 MED ORDER — DOCUSATE SODIUM 100 MG PO CAPS
100.0000 mg | ORAL_CAPSULE | Freq: Two times a day (BID) | ORAL | Status: DC
  Administered 2014-06-04 – 2014-06-06 (×3): 100 mg via ORAL
  Filled 2014-06-03 (×7): qty 1

## 2014-06-03 MED ORDER — OXYCODONE HCL 5 MG/5ML PO SOLN: 5.0000 mg | Freq: Once | ORAL | Status: DC | PRN

## 2014-06-03 MED ORDER — METHOCARBAMOL 500 MG PO TABS
500.0000 mg | ORAL_TABLET | Freq: Four times a day (QID) | ORAL | Status: DC | PRN
  Administered 2014-06-03 – 2014-06-05 (×3): 500 mg via ORAL
  Filled 2014-06-03 (×3): qty 1

## 2014-06-03 MED ORDER — FENTANYL CITRATE 0.05 MG/ML IJ SOLN
INTRAMUSCULAR | Status: AC
  Filled 2014-06-03: qty 5

## 2014-06-03 MED ORDER — ONDANSETRON HCL 4 MG/2ML IJ SOLN
4.0000 mg | Freq: Four times a day (QID) | INTRAMUSCULAR | Status: DC | PRN
  Administered 2014-06-04: 4 mg via INTRAVENOUS
  Filled 2014-06-03: qty 2

## 2014-06-03 MED ORDER — CHLORHEXIDINE GLUCONATE CLOTH 2 % EX PADS
6.0000 | MEDICATED_PAD | Freq: Every day | CUTANEOUS | Status: DC
  Administered 2014-06-03 – 2014-06-06 (×4): 6 via TOPICAL

## 2014-06-03 MED ORDER — LIDOCAINE HCL (CARDIAC) 20 MG/ML IV SOLN
INTRAVENOUS | Status: DC | PRN
  Administered 2014-06-03: 40 mg via INTRAVENOUS

## 2014-06-03 MED ORDER — PROPOFOL 10 MG/ML IV BOLUS
INTRAVENOUS | Status: AC
  Filled 2014-06-03: qty 20

## 2014-06-03 MED ORDER — MUPIROCIN 2 % EX OINT
1.0000 "application " | TOPICAL_OINTMENT | Freq: Two times a day (BID) | CUTANEOUS | Status: DC
  Administered 2014-06-03 – 2014-06-06 (×8): 1 via NASAL
  Filled 2014-06-03: qty 22

## 2014-06-03 MED ORDER — OXYCODONE-ACETAMINOPHEN 5-325 MG PO TABS
1.0000 | ORAL_TABLET | ORAL | Status: DC | PRN
  Administered 2014-06-03 – 2014-06-06 (×10): 2 via ORAL
  Filled 2014-06-03 (×10): qty 2

## 2014-06-03 MED ORDER — PROPOFOL 10 MG/ML IV BOLUS
INTRAVENOUS | Status: DC | PRN
  Administered 2014-06-03: 10 mg via INTRAVENOUS
  Administered 2014-06-03: 20 mg via INTRAVENOUS
  Administered 2014-06-03 (×2): 10 mg via INTRAVENOUS

## 2014-06-03 MED ORDER — LIDOCAINE HCL (CARDIAC) 20 MG/ML IV SOLN
INTRAVENOUS | Status: AC
  Filled 2014-06-03: qty 5

## 2014-06-03 MED ORDER — PROMETHAZINE HCL 25 MG/ML IJ SOLN: 6.2500 mg | INTRAMUSCULAR | Status: DC | PRN

## 2014-06-03 MED ORDER — MIDAZOLAM HCL 2 MG/2ML IJ SOLN
INTRAMUSCULAR | Status: DC | PRN
  Administered 2014-06-03: 2 mg via INTRAVENOUS

## 2014-06-03 MED ORDER — ONDANSETRON HCL 4 MG PO TABS
4.0000 mg | ORAL_TABLET | Freq: Four times a day (QID) | ORAL | Status: DC | PRN
  Filled 2014-06-03: qty 1

## 2014-06-03 MED ORDER — ROPIVACAINE HCL 5 MG/ML IJ SOLN
INTRAMUSCULAR | Status: DC | PRN
  Administered 2014-06-03 (×2): 75 mg via PERINEURAL

## 2014-06-03 MED ORDER — SODIUM CHLORIDE 0.9 % IV SOLN
INTRAVENOUS | Status: DC | PRN
  Administered 2014-06-03: 08:00:00 via INTRAVENOUS

## 2014-06-03 MED ORDER — HYDROMORPHONE HCL 1 MG/ML IJ SOLN: 0.2500 mg | INTRAMUSCULAR | Status: DC | PRN

## 2014-06-03 SURGICAL SUPPLY — 30 items
BNDG CMPR 9X4 STRL LF SNTH (GAUZE/BANDAGES/DRESSINGS)
BNDG COHESIVE 4X5 TAN STRL (GAUZE/BANDAGES/DRESSINGS) ×5 IMPLANT
BNDG ESMARK 4X9 LF (GAUZE/BANDAGES/DRESSINGS) IMPLANT
BNDG GAUZE ELAST 4 BULKY (GAUZE/BANDAGES/DRESSINGS) ×5 IMPLANT
COVER SURGICAL LIGHT HANDLE (MISCELLANEOUS) ×3 IMPLANT
DRAPE U-SHAPE 47X51 STRL (DRAPES) ×3 IMPLANT
DRSG ADAPTIC 3X8 NADH LF (GAUZE/BANDAGES/DRESSINGS) ×4 IMPLANT
DRSG PAD ABDOMINAL 8X10 ST (GAUZE/BANDAGES/DRESSINGS) ×5 IMPLANT
DURAPREP 26ML APPLICATOR (WOUND CARE) ×3 IMPLANT
ELECT REM PT RETURN 9FT ADLT (ELECTROSURGICAL) ×3
ELECTRODE REM PT RTRN 9FT ADLT (ELECTROSURGICAL) ×1 IMPLANT
GAUZE SPONGE 4X4 12PLY STRL (GAUZE/BANDAGES/DRESSINGS) ×2 IMPLANT
GLOVE BIOGEL PI IND STRL 9 (GLOVE) ×1 IMPLANT
GLOVE BIOGEL PI INDICATOR 9 (GLOVE) ×2
GLOVE SURG ORTHO 9.0 STRL STRW (GLOVE) ×3 IMPLANT
GOWN STRL REUS W/ TWL XL LVL3 (GOWN DISPOSABLE) ×2 IMPLANT
GOWN STRL REUS W/TWL XL LVL3 (GOWN DISPOSABLE) ×6
KIT BASIN OR (CUSTOM PROCEDURE TRAY) ×3 IMPLANT
KIT ROOM TURNOVER OR (KITS) ×3 IMPLANT
MANIFOLD NEPTUNE II (INSTRUMENTS) ×3 IMPLANT
NEEDLE 22X1 1/2 (OR ONLY) (NEEDLE) IMPLANT
NS IRRIG 1000ML POUR BTL (IV SOLUTION) ×3 IMPLANT
PACK ORTHO EXTREMITY (CUSTOM PROCEDURE TRAY) ×3 IMPLANT
PAD ARMBOARD 7.5X6 YLW CONV (MISCELLANEOUS) ×6 IMPLANT
SPONGE GAUZE 4X4 12PLY STER LF (GAUZE/BANDAGES/DRESSINGS) ×2 IMPLANT
SUCTION FRAZIER TIP 10 FR DISP (SUCTIONS) IMPLANT
SUT ETHILON 2 0 PSLX (SUTURE) ×3 IMPLANT
SYR CONTROL 10ML LL (SYRINGE) IMPLANT
TOWEL OR 17X24 6PK STRL BLUE (TOWEL DISPOSABLE) ×3 IMPLANT
TOWEL OR 17X26 10 PK STRL BLUE (TOWEL DISPOSABLE) ×3 IMPLANT

## 2014-06-03 NOTE — Progress Notes (Signed)
[facility] PROGRESS NOTE  Len ChildsBertha L Hendler YQI:[id number] DOB: 08/12/1932 DOA: 06/01/2014 PCP: Lillia MountainGRIFFIN,JOHN JOSEPH, MD  Assessment/Plan: #1 right foot cellulitis/acute osteomyelitis of the left great toe/chronic osteomyelitis of the right foot  Sedimentation rate and CRP elevated. Patient status post ABIs/TBI. Cellulitis improving. Patient s/p left great toe and right second toe amputation 06/03/14. Continue empiric IV vancomycin IV Zosyn. Orthopedic ff.  #2 well controlled diabetes mellitus Hemoglobin A1c 5.9. CBGs are ranging from 98 -162. Continue home dose Lantus and sliding scale insulin.   #3 hypertension Stable. Follow.  #4 gastroesophageal reflux disease PPI.  #5 depression Continue Cymbalta  #6 prophylaxis PPI for GI prophylaxis. Lovenox for DVT prophylaxis.  Code Status: Full Family Communication: Updated patient in PACU. Disposition Plan: Per orthopedics.   Consultants:  Orthopedics: Dr. Lajoyce Cornersuda  06/02/14  Procedures:  X-ray of the right foot 06/01/2014  X-ray of the left great toe 06/01/2014  ABIs/TBI 06/02/2014  Amputation left great toe and second right toe 06/03/14 Dr Lajoyce Cornersuda  Antibiotics:  IV vancomycin 06/01/2014  IV Zosyn 06/01/2014  HPI/Subjective: Patient seen in PACU. Patient postop. Opens eyes to verbal stimulation and following commands. No complaints.   Objective: Filed Vitals:   06/03/14 0821  BP: 99/51  Pulse: 57  Temp:   Resp: 17    Intake/Output Summary (Last 24 hours) at 06/03/14 0827 Last data filed at 06/03/14 0749  Gross per 24 hour  Intake   2490 ml  Output      5 ml  Net   2485 ml   Filed Weights   06/01/14 2113 06/03/14 0527  Weight: 68 kg (149 lb 14.6 oz) 59.7 kg (131 lb 9.8 oz)    Exam:   General:  NAD  Cardiovascular: RRR  Respiratory: CTAB ANTERIOR LUNG FIELDS  Abdomen: Soft, nontender, nondistended, positive bowel sounds.  Musculoskeletal: Coban wraps bilaterally.  Data Reviewed: Basic  Metabolic Panel:  Recent Labs Lab 06/01/14 1440 06/01/14 1442 06/02/14 0444  NA  --  138 138  K  --  4.0 3.8  CL  --  95* 100  CO2  --  34* 30  GLUCOSE  --  60* 152*  BUN  --  25* 16  CREATININE  --  0.67 0.56  CALCIUM  --  9.9 8.8  MG 2.0  --   --    Liver Function Tests:  Recent Labs Lab 06/01/14 1533 06/02/14 0444  AST  --  13  ALT  --  8  ALKPHOS  --  112  BILITOT  --  0.3  PROT  --  6.2  ALBUMIN 3.5 2.8*   No results found for this basename: LIPASE, AMYLASE,  in the last 168 hours No results found for this basename: AMMONIA,  in the last 168 hours CBC:  Recent Labs Lab 06/01/14 1442 06/02/14 0444  WBC 7.6 5.0  HGB 12.9 11.1*  HCT 40.2 33.3*  MCV 86.6 84.1  PLT 350 264   Cardiac Enzymes: No results found for this basename: CKTOTAL, CKMB, CKMBINDEX, TROPONINI,  in the last 168 hours BNP (last 3 results) No results found for this basename: PROBNP,  in the last 8760 hours CBG:  Recent Labs Lab 06/02/14 0726 06/02/14 1133 06/02/14 1653 06/03/14 0536 06/03/14 0809  GLUCAP 147* 126* 252* 162* 98    Recent Results (from the past 240 hour(s))  CULTURE, BLOOD (ROUTINE X 2)     Status: None   Collection Time    06/01/14  4:55 PM  Result Value Ref Range Status   Specimen Description BLOOD LEFT ANTECUBITAL   Final   Special Requests BOTTLES DRAWN AEROBIC AND ANAEROBIC   Final   Culture  Setup Time     Final   Value: 06/27/14 21:58     Performed at [facility]   Culture     Final   Value:        BLOOD CULTURE RECEIVED NO GROWTH TO DATE CULTURE WILL BE HELD FOR 5 DAYS BEFORE ISSUING A FINAL NEGATIVE REPORT     Performed at [facility]   Report Status PENDING   Incomplete  CULTURE, BLOOD (ROUTINE X 2)     Status: None   Collection Time    2014-06-27  5:22 PM      Result Value Ref Range Status   Specimen Description BLOOD RIGHT ANTECUBITAL   Final   Special Requests BOTTLES DRAWN AEROBIC AND ANAEROBIC   Final   Culture   Setup Time     Final   Value: 06/27/14 21:58     Performed at [facility]   Culture     Final   Value:        BLOOD CULTURE RECEIVED NO GROWTH TO DATE CULTURE WILL BE HELD FOR 5 DAYS BEFORE ISSUING A FINAL NEGATIVE REPORT     Performed at [facility]   Report Status PENDING   Incomplete  SURGICAL PCR SCREEN     Status: Abnormal   Collection Time    06/02/14 10:26 PM      Result Value Ref Range Status   MRSA, PCR POSITIVE (*) NEGATIVE Final   Comment: RESULT CALLED TO, READ BACK BY AND VERIFIED WITH:     C.BEVERLY,RN AT 0135 ON 06/03/14 BY SHEA.W   Staphylococcus aureus POSITIVE (*) NEGATIVE Final   Comment:            The Xpert SA Assay (FDA     approved for NASAL specimens     in patients over 4 years of age),     is one component of     a comprehensive surveillance     program.  Test performance has     been validated by [employer] for patients greater     than or equal to 29 year old.     It is not intended     to diagnose infection nor to     guide or monitor treatment.     Studies: Dg Foot Complete Right  2014-06-27   CLINICAL DATA:  Swollen and red foot. Diabetes. Initial evaluation.  EXAM: RIGHT FOOT COMPLETE - 3+ VIEW  COMPARISON:  MRI 03/18/2014, right foot series 8 03/2014  FINDINGS: Diffuse soft tissue swelling is present. Vascular calcification is present. Patient has had a prior first digit amputation. Bony erosion noted along the lateral aspect of the distal first metatarsals noted. This is most likely secondary osteomyelitis. This is a new finding.  IMPRESSION: 1. Diffuse soft tissue swelling consistent with cellulitis. 2. Lytic area along the lateral aspect of the distal right first metatarsal consistent with a prominent focus of osteomyelitis . These results will be called to the ordering clinician or representative by the Radiologist Assistant, and communication documented in the PACS or zVision Dashboard.   Electronically Signed   By:  Maisie Fus  Register   On: 06-27-2014 15:34   Dg Toe Great Left  June 27, 2014   CLINICAL DATA:  Nonhealing wound of  the left big toe. Patient is diabetic  EXAM: LEFT GREAT TOE  COMPARISON:  September 23, 2013  FINDINGS: There is no evidence of fracture or dislocation. There is bony defect in the tip of the first distal phalangeal tuft which appear slightly more irregular compared to the previous exam; this is suspicious for osteomyelitis.  IMPRESSION: There is bony defect in the tip of the first distal phalangeal tuft which appear slightly more irregular compared to the previous exam; this is suspicious for osteomyelitis.   Electronically Signed   By: Sherian ReinWei-Chen  Lin M.D.   On: 06/01/2014 15:32    Scheduled Meds: . aspirin EC  81 mg Oral q morning - 10a  . calcium-vitamin D  1 tablet Oral Q breakfast  .  ceFAZolin (ANCEF) IV  2 g Intravenous On Call to OR  . Chlorhexidine Gluconate Cloth  6 each Topical Q0600  . cholecalciferol  1,000 Units Oral q morning - 10a  . [START ON 06/04/2014] conjugated estrogens  1 Applicatorful Vaginal Once per day on Mon Thu  . DULoxetine  30 mg Oral q morning - 10a  . enoxaparin (LOVENOX) injection  40 mg Subcutaneous Q24H  . feeding supplement (GLUCERNA SHAKE)  237 mL Oral TID BM  . insulin aspart  0-9 Units Subcutaneous TID WC  . insulin glargine  16 Units Subcutaneous QHS  . iron polysaccharides  150 mg Oral BID  . loratadine  10 mg Oral Daily  . LORazepam  0.5 mg Oral Q12H  . morphine  15 mg Oral Q12H  . mupirocin ointment  1 application Nasal BID  . pantoprazole  40 mg Oral Q0600  . piperacillin-tazobactam (ZOSYN)  IV  3.375 g Intravenous Q8H  . potassium chloride SA  20 mEq Oral q morning - 10a  . senna-docusate  2 tablet Oral QHS  . vancomycin  500 mg Intravenous Q12H   Continuous Infusions:    Principal Problem:   Cellulitis of right foot Active Problems:   Acute osteomyelitis of toe of left foot   Diabetes mellitus   HTN (hypertension)   Acute  osteomyelitis of toe of right foot   GERD (gastroesophageal reflux disease)   Depression   Cellulitis of foot, right    Time spent: 7735 MINS    [facility] Hospital[facility]OMPSON,Jazmen Lindenbaum MD [facility] Pager [phone]. If 7PM-7AM, please contact night-coverage at [email or web address], password [email or web address] 06/03/2014, 8:27 AM  LOS: 2 days

## 2014-06-03 NOTE — Clinical Social Work Note (Signed)
CSW rec'd consult for ?SNF- [facility]. [facility].  Reece LevyJanet Phyllip Claw, MSW, LCSWA [phone]/weekend coverage- Lucien MonsWL

## 2014-06-03 NOTE — H&P (View-Only) (Signed)
Reason for Consult: Osteomyelitis left foot great toe and right foot second toe Referring Physician: Dr. [name] is an 80 y.o. female.  HPI: Patient is a 80 year old woman with peripheral vascular disease and diabetes. Patient is status post amputation of the right toe she presents at this time with cellulitis a sending from the second toe right foot and a necrotic ulcer over the tip of the great toe left foot  Past Medical History  Diagnosis Date  . Incontinence of urine   . Diarrhea   . Neuropathy     bilateral feet  . GERD (gastroesophageal reflux disease)   . Depression   . Urinary, incontinence, stress female   . History of shingles 2009  . Degenerative lumbar spinal stenosis   . Type II diabetes mellitus     Type 2 IDDM  X 40 years;   . Arthritis     lower back/hands  . Altered mental status 01/13/2013  . Hypertension   . Pneumonia     Past Surgical History  Procedure Laterality Date  . Incontinence surgery  2005  . Tonsillectomy  1964  . Abdominal hysterectomy  1982  . Ovarian cyst surgery  1972  . Cholecystectomy  1988  . Cystoscopy  2009    macroplastique inj  . Rectocele repair  10/15/2011    Procedure: POSTERIOR REPAIR (RECTOCELE);  Surgeon: Ailene Rud, MD;  Location: [facility];  Service: Urology;;  posterior vaginal vault repair with sacral spinus repair with graft  . Cystoscopy  10/15/2011    Procedure: CYSTOSCOPY;  Surgeon: Ailene Rud, MD;  Location: [facility];  Service: Urology;  Laterality: N/A;  . Flexible sigmoidoscopy  02/28/2012    Procedure: FLEXIBLE SIGMOIDOSCOPY;  Surgeon: Arta Silence, MD;  Location: [facility];  Service: Endoscopy;  Laterality: N/A;  . Breast biopsy  1990    bilaterally  . Cataract extraction w/ intraocular lens implant    . Posterior fusion lumbar spine  05/09/2012  . Amputation Right 03/30/2014    Procedure: Right Great Toe Amputation at MTP Joint;  Surgeon:  Newt Minion, MD;  Location: [facility];  Service: Orthopedics;  Laterality: Right;    Family History  Problem Relation Age of Onset  . Osteoarthritis Sister   . Osteoarthritis Brother     Social History:  reports that she has never smoked. She has never used smokeless tobacco. She reports that she does not drink alcohol or use illicit drugs.  Allergies:  Allergies  Allergen Reactions  . Ciprofloxacin Other (See Comments)    Leg and feet numbness  . Lidoderm [Lidocaine] Itching, Rash and Other (See Comments)    Burning sensations also  . Ace Inhibitors Other (See Comments)    Poorly tolerated-per PCP  . Angiotensin Receptor Blockers Other (See Comments)    Poorly tolerated-per PCP  . Glucotrol [Glipizide] Nausea Only and Other (See Comments)    Headaches also  . Triple Antibiotic [Bacitracin-Neomycin-Polymyxin] Other (See Comments)    Poorly tolerated  . Neosporin [Neomycin-Bacitracin Zn-Polymyx] Rash  . Sulfa Antibiotics Rash  . Zyrtec [Cetirizine] Itching and Rash    Medications: I have reviewed the patient's current medications.  Results for orders placed during the hospital encounter of 06/01/14 (from the past 48 hour(s))  MAGNESIUM     Status: None   Collection Time    06/01/14  2:40 PM      Result Value Ref Range   Magnesium 2.0  1.5 -  2.5 mg/dL  CBC     Status: None   Collection Time    06/01/14  2:42 PM      Result Value Ref Range   WBC 7.6  4.0 - 10.5 K/uL   RBC 4.64  3.87 - 5.11 MIL/uL   Hemoglobin 12.9  12.0 - 15.0 g/dL   HCT 40.2  36.0 - 46.0 %   MCV 86.6  78.0 - 100.0 fL   MCH 27.8  26.0 - 34.0 pg   MCHC 32.1  30.0 - 36.0 g/dL   RDW 13.5  11.5 - 15.5 %   Platelets 350  150 - 400 K/uL  BASIC METABOLIC PANEL     Status: Abnormal   Collection Time    06/01/14  2:42 PM      Result Value Ref Range   Sodium 138  137 - 147 mEq/L   Potassium 4.0  3.7 - 5.3 mEq/L   Chloride 95 (*) 96 - 112 mEq/L   CO2 34 (*) 19 - 32 mEq/L   Glucose, Bld 60 (*) 70 - 99 mg/dL    BUN 25 (*) 6 - 23 mg/dL   Creatinine, Ser 0.67  0.50 - 1.10 mg/dL   Calcium 9.9  8.4 - 10.5 mg/dL   GFR calc non Af Amer 81 (*) >90 mL/min   GFR calc Af Amer >90  >90 mL/min   Comment: (NOTE)     The eGFR has been calculated using the CKD EPI equation.     This calculation has not been validated in all clinical situations.     eGFR's persistently <90 mL/min signify possible Chronic Kidney     Disease.   Anion gap 9  5 - 15  HEMOGLOBIN A1C     Status: Abnormal   Collection Time    06/01/14  3:33 PM      Result Value Ref Range   Hemoglobin A1C 5.9 (*) <5.7 %   Comment: (NOTE)                                                                               According to the ADA Clinical Practice Recommendations for 2011, when     HbA1c is used as a screening test:      >=6.5%   Diagnostic of Diabetes Mellitus               (if abnormal result is confirmed)     5.7-6.4%   Increased risk of developing Diabetes Mellitus     References:Diagnosis and Classification of Diabetes Mellitus,Diabetes     HWTU,8828,00(LKJZP 1):S62-S69 and Standards of Medical Care in             Diabetes - 2011,Diabetes Care,2011,34 (Suppl 1):S11-S61.   Mean Plasma Glucose 123 (*) <117 mg/dL   Comment: Performed at [place]     Status: None   Collection Time    06/01/14  3:33 PM      Result Value Ref Range   Albumin 3.5  3.5 - 5.2 g/dL  SEDIMENTATION RATE     Status: Abnormal   Collection Time    06/01/14  3:43 PM      Result Value  Ref Range   Sed Rate 62 (*) 0 - 22 mm/hr  C-REACTIVE PROTEIN     Status: Abnormal   Collection Time    06/01/14  3:43 PM      Result Value Ref Range   CRP 4.4 (*) <0.60 mg/dL   Comment: Performed at [place], [place]     Status: Abnormal   Collection Time    06/01/14  6:44 PM      Result Value Ref Range   Glucose-Capillary 124 (*) 70 - 99 mg/dL  GLUCOSE, CAPILLARY     Status: Abnormal   Collection Time    06/01/14  9:04 PM       Result Value Ref Range   Glucose-Capillary 252 (*) 70 - 99 mg/dL  COMPREHENSIVE METABOLIC PANEL     Status: Abnormal   Collection Time    06/02/14  4:44 AM      Result Value Ref Range   Sodium 138  137 - 147 mEq/L   Potassium 3.8  3.7 - 5.3 mEq/L   Chloride 100  96 - 112 mEq/L   CO2 30  19 - 32 mEq/L   Glucose, Bld 152 (*) 70 - 99 mg/dL   BUN 16  6 - 23 mg/dL   Creatinine, Ser 0.56  0.50 - 1.10 mg/dL   Calcium 8.8  8.4 - 10.5 mg/dL   Total Protein 6.2  6.0 - 8.3 g/dL   Albumin 2.8 (*) 3.5 - 5.2 g/dL   AST 13  0 - 37 U/L   ALT 8  0 - 35 U/L   Alkaline Phosphatase 112  39 - 117 U/L   Total Bilirubin 0.3  0.3 - 1.2 mg/dL   GFR calc non Af Amer 86 (*) >90 mL/min   GFR calc Af Amer >90  >90 mL/min   Comment: (NOTE)     The eGFR has been calculated using the CKD EPI equation.     This calculation has not been validated in all clinical situations.     eGFR's persistently <90 mL/min signify possible Chronic Kidney     Disease.   Anion gap 8  5 - 15  CBC     Status: Abnormal   Collection Time    06/02/14  4:44 AM      Result Value Ref Range   WBC 5.0  4.0 - 10.5 K/uL   RBC 3.96  3.87 - 5.11 MIL/uL   Hemoglobin 11.1 (*) 12.0 - 15.0 g/dL   HCT 33.3 (*) 36.0 - 46.0 %   MCV 84.1  78.0 - 100.0 fL   MCH 28.0  26.0 - 34.0 pg   MCHC 33.3  30.0 - 36.0 g/dL   RDW 13.3  11.5 - 15.5 %   Platelets 264  150 - 400 K/uL   Comment: RESULT REPEATED AND VERIFIED     DELTA CHECK NOTED  PROTIME-INR     Status: None   Collection Time    06/02/14  4:44 AM      Result Value Ref Range   Prothrombin Time 14.4  11.6 - 15.2 seconds   INR 1.11  0.00 - 1.49  APTT     Status: None   Collection Time    06/02/14  4:44 AM      Result Value Ref Range   aPTT 37  24 - 37 seconds   Comment:            IF BASELINE aPTT IS ELEVATED,  SUGGEST PATIENT RISK ASSESSMENT     BE USED TO DETERMINE APPROPRIATE     ANTICOAGULANT THERAPY.  GLUCOSE, [facility]     Status: Abnormal   Collection Time    06/02/14   5:50 AM      Result Value Ref Range   Glucose-Capillary 147 (*) 70 - 99 mg/dL  GLUCOSE, CAPILLARY     Status: Abnormal   Collection Time    06/02/14  7:26 AM      Result Value Ref Range   Glucose-Capillary 147 (*) 70 - 99 mg/dL  GLUCOSE, CAPILLARY     Status: Abnormal   Collection Time    06/02/14 11:33 AM      Result Value Ref Range   Glucose-Capillary 126 (*) 70 - 99 mg/dL    Dg Foot Complete Right  06/01/2014   CLINICAL DATA:  Swollen and red foot. Diabetes. Initial evaluation.  EXAM: RIGHT FOOT COMPLETE - 3+ VIEW  COMPARISON:  MRI 03/18/2014, right foot series 8 03/2014  FINDINGS: Diffuse soft tissue swelling is present. Vascular calcification is present. Patient has had a prior first digit amputation. Bony erosion noted along the lateral aspect of the distal first metatarsals noted. This is most likely secondary osteomyelitis. This is a new finding.  IMPRESSION: 1. Diffuse soft tissue swelling consistent with cellulitis. 2. Lytic area along the lateral aspect of the distal right first metatarsal consistent with a prominent focus of osteomyelitis . These results will be called to the ordering clinician or representative by the Radiologist Assistant, and communication documented in the PACS or zVision Dashboard.   Electronically Signed   By: Marcello Moores  Register   On: 06/01/2014 15:34   Dg Toe Great Left  06/01/2014   CLINICAL DATA:  Nonhealing wound of the left big toe. Patient is diabetic  EXAM: LEFT GREAT TOE  COMPARISON:  September 23, 2013  FINDINGS: There is no evidence of fracture or dislocation. There is bony defect in the tip of the first distal phalangeal tuft which appear slightly more irregular compared to the previous exam; this is suspicious for osteomyelitis.  IMPRESSION: There is bony defect in the tip of the first distal phalangeal tuft which appear slightly more irregular compared to the previous exam; this is suspicious for osteomyelitis.   Electronically Signed   By: Abelardo Diesel M.D.   On: 06/01/2014 15:32    Review of Systems  All other systems reviewed and are negative.  Blood pressure 154/65, pulse 95, temperature 99 F (37.2 C), temperature source Oral, resp. rate 16, height _0  (1.626 m), weight 68 kg (149 lb 14.6 oz), SpO2 98.00%. Physical Exam On examination patient has a faintly palpable dorsalis pedis pulse ankle brachial indices were obtained which shows an ankle-brachial indices of about 1 with monophasic flow which is indicated a false with elevated ABIs. Patient has cellulitis of the second toe right foot and a gangrenous ulcer of the great toe left foot. Radiographs show destructive changes of the tuft of the great toe left foot. Assessment/Plan: Assessment: Peripheral vascular disease diabetes with cellulitis and osteomyelitis of the right foot second toe and osteomyelitis of the left foot great toe.  Plan: We'll plan for amputation of the second toe right foot and great toe left foot. Discussed with the patient and her family risks of the wound nonhealing potential for additional surgery. Patient and family state they understand and wish to proceed at this time we will try to set up surgery for Sunday morning.  DUDA,MARCUS  V 06/02/2014, 3:37 PM

## 2014-06-03 NOTE — Interval H&P Note (Signed)
History and Physical Interval Note:  06/03/2014 5:53 AM  [name]  has presented today for surgery, with the diagnosis of osteomolitis toe  The various methods of treatment have been discussed with the patient and family. After consideration of risks, benefits and other options for treatment, the patient has consented to  Procedure(s): AMPUTATION LEFT GREAT TOE  AND  RIGHT SECOND TOE (N/A) as a surgical intervention .  The patient's history has been reviewed, patient examined, no change in status, stable for surgery.  I have reviewed the patient's chart and labs.  Questions were answered to the patient's satisfaction.     Sherrita Riederer V

## 2014-06-03 NOTE — Progress Notes (Signed)
OT Cancellation Note  Patient Details Name: Len ChildsBertha L Resnick MRN: [id number] DOB: 03/01/1933   Cancelled Treatment:      Cancelled due to pt having surgery this am.   Earlie RavelingStraub, Algenis Ballin L OTR/L [phone] 06/03/2014, 8:04 AM

## 2014-06-03 NOTE — Progress Notes (Addendum)
Inpatient Diabetes Program Recommendations  AACE/ADA: New Consensus Statement on Inpatient Glycemic Control (2013)  Target Ranges:  Prepandial:   less than 140 mg/dL      Peak postprandial:   less than 180 mg/dL (1-2 hours)      Critically ill patients:  140 - 180 mg/dL   Reason for Assessment: Results for [name], [name] (MRN [id number]) as of 06/03/2014 11:27  Ref. Range 06/02/2014 07:26 06/02/2014 11:33 06/02/2014 16:53 06/03/2014 05:36 06/03/2014 08:09  Glucose-Capillary Latest Range: 70-99 mg/dL 409147 (H) 811126 (H) 914252 (H) 162 (H) 98  Results for [name], [name] (MRN [id number]) as of 06/03/2014 11:27  Ref. Range 06/01/2014 15:33  Hemoglobin A1C Latest Range: <5.7 % 5.9 (H)   Diabetes history: Diabetes/Cellulitis and amputation of both Right foot second toe and left foot great toe Outpatient Diabetes medications: Lantus 16 units daily, Novolog 5 units tid with meals Current orders for Inpatient glycemic control:  Lantus 16 units daily, Novolog tid with meals   Please consider adding Novolog 3 units tid with meals (hold if patient eats less than 50%).  Thanks, Beryl MeagerJenny Molly Maselli, RN, BC-ADM Inpatient Diabetes Coordinator Pager [phone]

## 2014-06-03 NOTE — Op Note (Signed)
06/01/2014 - 06/03/2014  8:11 AM  PATIENT:  [name]    PRE-OPERATIVE DIAGNOSIS:  osteomolitis toe Right foot and Left foot  POST-OPERATIVE DIAGNOSIS:  Same  PROCEDURE:  AMPUTATION LEFT GREAT TOE  AND  RIGHT SECOND TOE  SURGEON:  Nadara MustardUDA,Nyjai Graff V, MD  PHYSICIAN ASSISTANT:None ANESTHESIA:   General  PREOPERATIVE INDICATIONS:  [name] is a  80 y.o. female with a diagnosis of osteomolitis toe Right foot and Left foot who failed conservative measures and elected for surgical management.    The risks benefits and alternatives were discussed with the patient preoperatively including but not limited to the risks of infection, bleeding, nerve injury, cardiopulmonary complications, the need for revision surgery, among others, and the patient was willing to proceed.  OPERATIVE IMPLANTS: none  OPERATIVE FINDINGS: ischemic tissue  OPERATIVE PROCEDURE: right foot second toe amputated and left foot great toe amputated.

## 2014-06-03 NOTE — Anesthesia Procedure Notes (Addendum)
Anesthesia Regional Block:  Popliteal block  Pre-Anesthetic Checklist: ,, timeout performed, Correct Patient, Correct Site, Correct Laterality, Correct Procedure, Correct Position, site marked, Risks and benefits discussed,  Surgical consent,  Pre-op evaluation,  At surgeon's request and post-op pain management  Laterality: Left  Prep: chloraprep       Needles:  Injection technique: Single-shot  Needle Type: Echogenic Stimulator Needle          Additional Needles:  Procedures: ultrasound guided (picture in chart) and nerve stimulator Popliteal block  Nerve Stimulator or Paresthesia:  Response: plantar flexion, 0.45 mA,   Additional Responses:   Narrative:  Start time: 06/03/2014 7:08 AM End time: 06/03/2014 7:17 AM Injection made incrementally with aspirations every 5 mL.  Performed by: Personally  Anesthesiologist: J. Adonis Hugueninan Singer, MD  Additional Notes: A functioning IV was confirmed and monitors were applied.  Sterile prep and drape, hand hygiene and sterile gloves were used.  Negative aspiration and test dose prior to incremental administration of local anesthetic. The patient tolerated the procedure well.Ultrasound  guidance: relevant anatomy identified, needle position confirmed, local anesthetic spread visualized around nerve(s), vascular puncture avoided.  Image printed for medical record.    Anesthesia Regional Block:  Popliteal block  Pre-Anesthetic Checklist: ,, timeout performed, Correct Patient, Correct Site, Correct Laterality, Correct Procedure, Correct Position, site marked, Risks and benefits discussed,  Surgical consent,  Pre-op evaluation,  At surgeon's request and post-op pain management  Laterality: Right  Prep: chloraprep       Needles:  Injection technique: Single-shot  Needle Type: Echogenic Stimulator Needle          Additional Needles:  Procedures: ultrasound guided (picture in chart) and nerve stimulator Popliteal block  Nerve  Stimulator or Paresthesia:  Response: plantar flexion, 0.45 mA,   Additional Responses:   Narrative:  Start time: 06/03/2014 7:18 AM End time: 06/03/2014 7:25 AM Injection made incrementally with aspirations every 5 mL.  Performed by: Personally  Anesthesiologist: J. Adonis Hugueninan Singer, MD  Additional Notes: A functioning IV was confirmed and monitors were applied.  Sterile prep and drape, hand hygiene and sterile gloves were used.  Negative aspiration and test dose prior to incremental administration of local anesthetic. The patient tolerated the procedure well.Ultrasound  guidance: relevant anatomy identified, needle position confirmed, local anesthetic spread visualized around nerve(s), vascular puncture avoided.  Image printed for medical record.    Anesthesia Procedure Image

## 2014-06-03 NOTE — Progress Notes (Signed)
[facility] transporters here to take [name] to [facility] 07 via stretcher.[facility] [phone]. Pt is in stable condition.unable to obtain u/a c&s due to pt is incont B&B.[name], [name]

## 2014-06-03 NOTE — Anesthesia Preprocedure Evaluation (Addendum)
Anesthesia Evaluation  Patient identified by MRN, date of birth, ID band Patient awake    Reviewed: Allergy & Precautions, H&P , NPO status , Patient's Chart, lab work & pertinent test results  Airway Mallampati: II TM Distance: >3 FB Neck ROM: Full    Dental  (+) Edentulous Upper, Dental Advisory Given   Pulmonary neg pulmonary ROS,    Pulmonary exam normal       Cardiovascular hypertension, Pt. on medications + Peripheral Vascular Disease     Neuro/Psych PSYCHIATRIC DISORDERS Depression negative neurological ROS     GI/Hepatic Neg liver ROS, GERD-  Medicated and Controlled,  Endo/Other  negative endocrine ROSdiabetes, Type 1, Insulin Dependent, Oral Hypoglycemic Agents  Renal/GU negative Renal ROS     Musculoskeletal  (+) Arthritis -,   Abdominal   Peds  Hematology   Anesthesia Other Findings   Reproductive/Obstetrics                        Anesthesia Physical  Anesthesia Plan  ASA: III  Anesthesia Plan: MAC and Regional   Post-op Pain Management:    Induction: Intravenous  Airway Management Planned: Simple Face Mask  Additional Equipment:   Intra-op Plan:   Post-operative Plan:   Informed Consent: I have reviewed the patients History and Physical, chart, labs and discussed the procedure including the risks, benefits and alternatives for the proposed anesthesia with the patient or authorized representative who has indicated his/her understanding and acceptance.   Dental advisory given  Plan Discussed with: CRNA, Anesthesiologist and Surgeon  Anesthesia Plan Comments:        Anesthesia Quick Evaluation

## 2014-06-03 NOTE — OR Nursing (Signed)
After arriving to [facility] with pt. She c/o L hand numbness and pain. Called Dr. Krista BlueSinger to make him aware of the situation; pt was not done under general anesthesia, only blocks. Informed Lelon HuhAnita RN on 5N. [facility] will keep an eye on the L hand and continue evaluating it. [facility]

## 2014-06-03 NOTE — Transfer of Care (Signed)
Immediate Anesthesia Transfer of Care Note  Patient: Tonya ChildsBertha L Winebarger  Procedure(s) Performed: Procedure(s): AMPUTATION LEFT GREAT TOE  AND  RIGHT SECOND TOE (N/A)  Patient Location: PACU  Anesthesia Type:MAC and MAC combined with regional for post-op pain  Level of Consciousness: sedated  Airway & Oxygen Therapy: Patient Spontanous Breathing  Post-op Assessment: Report given to PACU RN and Post -op Vital signs reviewed and stable  Post vital signs: Reviewed and stable  Complications: No apparent anesthesia complications

## 2014-06-03 NOTE — Anesthesia Postprocedure Evaluation (Signed)
Anesthesia Post Note  Patient: [name]  Procedure(s) Performed: Procedure(s) (LRB): AMPUTATION LEFT GREAT TOE  AND  RIGHT SECOND TOE (N/A)  Anesthesia type: MAC  Patient location: PACU  Post pain: Pain level controlled  Post assessment: Patient's Cardiovascular Status Stable  Last Vitals:  Filed Vitals:   06/03/14 0825  BP:   Pulse: 58  Temp:   Resp: 16    Post vital signs: Reviewed and stable  Level of consciousness: sedated  Complications: No apparent anesthesia complications

## 2014-06-03 NOTE — Discharge Instructions (Signed)
Change dressing PRN B feet

## 2014-06-04 DIAGNOSIS — E440 Moderate protein-calorie malnutrition: Secondary | ICD-10-CM | POA: Insufficient documentation

## 2014-06-04 LAB — GLUCOSE, CAPILLARY
GLUCOSE-CAPILLARY: 178 mg/dL — AB (ref 70–99)
GLUCOSE-CAPILLARY: 163 mg/dL — AB (ref 70–99)
Glucose-Capillary: 263 mg/dL — ABNORMAL HIGH (ref 70–99)
Glucose-Capillary: 100 mg/dL — ABNORMAL HIGH (ref 70–99)
Glucose-Capillary: 209 mg/dL — ABNORMAL HIGH (ref 70–99)

## 2014-06-04 LAB — CBC
HCT: 36.1 % (ref 36.0–46.0)
Hemoglobin: 11.5 g/dL — ABNORMAL LOW (ref 12.0–15.0)
MCH: 27.4 pg (ref 26.0–34.0)
MCHC: 31.9 g/dL (ref 30.0–36.0)
MCV: 86.2 fL (ref 78.0–100.0)
PLATELETS: 268 10*3/uL (ref 150–400)
RBC: 4.19 MIL/uL (ref 3.87–5.11)
RDW: 13.4 % (ref 11.5–15.5)
WBC: 6.7 10*3/uL (ref 4.0–10.5)

## 2014-06-04 LAB — BASIC METABOLIC PANEL
ANION GAP: 8 (ref 5–15)
BUN: 14 mg/dL (ref 6–23)
CHLORIDE: 101 meq/L (ref 96–112)
CO2: 32 mEq/L (ref 19–32)
CREATININE: 0.7 mg/dL (ref 0.50–1.10)
Calcium: 8.7 mg/dL (ref 8.4–10.5)
GFR calc non Af Amer: 80 mL/min — ABNORMAL LOW (ref >90)
Glucose, Bld: 99 mg/dL (ref 70–99)
Potassium: 3.7 mEq/L (ref 3.7–5.3)
SODIUM: 141 meq/L (ref 137–147)

## 2014-06-04 NOTE — Progress Notes (Signed)
INITIAL NUTRITION ASSESSMENT  Pt meets criteria for NON-SEVERE (MODERATE) MALNUTRITION in the context of chronic illness as evidenced by a 16.6% weight loss in 2 months and moderate fat and muscle mass loss.  DOCUMENTATION CODES Per approved criteria  -Non-severe (moderate) malnutrition in the context of chronic illness   INTERVENTION: Continue Glucerna Shake po TID, each supplement provides 220 kcal and 10 grams of protein  Encourage PO intake.  NUTRITION DIAGNOSIS: Increased nutrient needs related to s/p surgery as evidenced by estimated nutrition needs.   Goal: Pt to meet >/= 90% of their estimated nutrition needs   Monitor:  PO intake, weight trends, labs, I/O's  Reason for Assessment: MD consult for wound healing  80 y.o. female  Admitting Dx: Cellulitis of right foot  ASSESSMENT: With history of type 2 diabetes x40 years insulin-dependent with bilateral neuropathy, gastroesophageal reflux disease, depression, degenerative lumbar spinal stenosis, arthritis, hypertension, history of osteomyelitis of the right great toe status post right great toe amputation at MTP joint 03/30/2014. Pt with osteomyelitis left foot great toe and right foot second toe.  PROCEDURE: AMPUTATION LEFT GREAT TOE AND RIGHT SECOND TOE  Pt reports a lack of appetite currently. Pt reported vomiting after breakfast this AM. Meal completion has been 90%. Pt reports prior to this AM she has been having a good appetite and eating well with 3 meals a day. Pt reports she also has been drinking Glucerna at her assisted living. Pt currently has Glucerna ordered and reports she has been drinking them. Pt reports her usual body weight of 120 lbs, however per [facility] weight records pt has been 150-159 lbs. Pt with a 16.6% weight loss in 2 months. Pt was encouraged to continue her supplement drinks and to eat her food at meals.   Nutrition Focused Physical Exam:  Subcutaneous Fat:  Orbital Region: N/A Upper Arm  Region: Moderate depletion Thoracic and Lumbar Region: WNL  Muscle:  Temple Region: N/A Clavicle Bone Region: Severe depletion Clavicle and Acromion Bone Region: Moderate to Severe depletion Scapular Bone Region: N/A Dorsal Hand: Moderate depletion Patellar Region: N/A Anterior Thigh Region: N/A Posterior Calf Region: N/A  Edema: none  Height: Ht Readings from Last 1 Encounters:  06/03/14 5\' 4"  (1.626 m)    Weight: Wt Readings from Last 1 Encounters:  06/04/14 125 lb 4.8 oz (56.836 kg)    Ideal Body Weight: 120 lbs  % Ideal Body Weight: 104%  Wt Readings from Last 10 Encounters:  06/04/14 125 lb 4.8 oz (56.836 kg)  06/04/14 125 lb 4.8 oz (56.836 kg)  03/30/14 150 lb (68.04 kg)  03/30/14 150 lb (68.04 kg)  03/17/14 150 lb (68.04 kg)  01/16/13 159 lb (72.122 kg)  05/09/12 159 lb 4.8 oz (72.258 kg)  05/09/12 159 lb 4.8 oz (72.258 kg)  03/03/12 159 lb 6.3 oz (72.3 kg)  03/03/12 159 lb 6.3 oz (72.3 kg)    Usual Body Weight: 120 lbs  % Usual Body Weight: 104%  BMI:  Body mass index is 21.5 kg/(m^2).  Estimated Nutritional Needs: Kcal: 1650-1850 Protein: 70-80 grams Fluid: 1.65- 1.85 L/day  Skin: incision right and left foot  Diet Order: Carb Control  EDUCATION NEEDS: -No education needs identified at this time   Intake/Output Summary (Last 24 hours) at 06/04/14 0900 Last data filed at 06/04/14 0000  Gross per 24 hour  Intake  2956224380 ml  Output      0 ml  Net  24380 ml    Last BM: 10/25  Labs:  Recent Labs Lab 06/01/14 1440 06/01/14 1442 06/02/14 0444 06/04/14 0614  NA  --  138 138 141  K  --  4.0 3.8 3.7  CL  --  95* 100 101  CO2  --  34* 30 32  BUN  --  25* 16 14  CREATININE  --  0.67 0.56 0.70  CALCIUM  --  9.9 8.8 8.7  MG 2.0  --   --   --   GLUCOSE  --  60* 152* 99    CBG (last 3)   Recent Labs  06/03/14 1640 06/03/14 2155 06/04/14 0643  GLUCAP 186* 223* 100*    Scheduled Meds: . aspirin EC  81 mg Oral q morning - 10a   . calcium-vitamin D  1 tablet Oral Q breakfast  . Chlorhexidine Gluconate Cloth  6 each Topical Q0600  . cholecalciferol  1,000 Units Oral q morning - 10a  . conjugated estrogens  1 Applicatorful Vaginal Once per day on Mon Thu  . docusate sodium  100 mg Oral BID  . DULoxetine  30 mg Oral q morning - 10a  . enoxaparin (LOVENOX) injection  40 mg Subcutaneous Q24H  . feeding supplement (GLUCERNA SHAKE)  237 mL Oral TID BM  . insulin aspart  0-9 Units Subcutaneous TID WC  . insulin glargine  16 Units Subcutaneous QHS  . iron polysaccharides  150 mg Oral BID  . loratadine  10 mg Oral Daily  . LORazepam  0.5 mg Oral Q12H  . morphine  15 mg Oral Q12H  . mupirocin ointment  1 application Nasal BID  . pantoprazole  40 mg Oral Q0600  . piperacillin-tazobactam (ZOSYN)  IV  3.375 g Intravenous Q8H  . potassium chloride SA  20 mEq Oral q morning - 10a  . senna-docusate  2 tablet Oral QHS  . vancomycin  500 mg Intravenous Q12H    Continuous Infusions: . sodium chloride 10 mL/hr at 06/04/14 0000    Past Medical History  Diagnosis Date  . Incontinence of urine   . Diarrhea   . Neuropathy     bilateral feet  . GERD (gastroesophageal reflux disease)   . Depression   . Urinary, incontinence, stress female   . History of shingles 2009  . Degenerative lumbar spinal stenosis   . Type II diabetes mellitus     Type 2 IDDM  X 40 years;   . Arthritis     lower back/hands  . Altered mental status 01/13/2013  . Hypertension   . Pneumonia     Past Surgical History  Procedure Laterality Date  . Incontinence surgery  2005  . Tonsillectomy  1964  . Abdominal hysterectomy  1982  . Ovarian cyst surgery  1972  . Cholecystectomy  1988  . Cystoscopy  2009    macroplastique inj  . Rectocele repair  10/15/2011    Procedure: POSTERIOR REPAIR (RECTOCELE);  Surgeon: Kathi LudwigSigmund I Tannenbaum, MD;  [facility] [place];  Service: Urology;;  posterior vaginal vault repair with sacral spinus  repair with graft  . Cystoscopy  10/15/2011    Procedure: CYSTOSCOPY;  Surgeon: Kathi LudwigSigmund I Tannenbaum, MD;  [facility] [place];  Service: Urology;  Laterality: N/A;  . Flexible sigmoidoscopy  02/28/2012    Procedure: FLEXIBLE SIGMOIDOSCOPY;  Surgeon: Willis ModenaWilliam Outlaw, MD;  Location: [facility];  Service: Endoscopy;  Laterality: N/A;  . Breast biopsy  1990    bilaterally  . Cataract extraction w/ intraocular lens implant    .  Posterior fusion lumbar spine  05/09/2012  . Amputation Right 03/30/2014    Procedure: Right Great Toe Amputation at MTP Joint;  Surgeon: Nadara Mustard, MD;  [facility];  Service: Orthopedics;  Laterality: Right;    Marijean Niemann, MS, RD, LDN Pager # [phone] After hours/ weekend pager # [phone]

## 2014-06-04 NOTE — Care Management Note (Signed)
IM letter given. Lamica Mccart, RN BSN Case Manager 

## 2014-06-04 NOTE — Progress Notes (Signed)
Pt stated that there is no numbness or tingling in hand (during this shift). Her right ear has scabbing on it ('from a perm she had a long time ago'). She stated that the doctor had assessed it and advised her to stop picking the scab off. She has a wash cloth to the right of her head so she doesn't lay on it due to discomfort.

## 2014-06-04 NOTE — Progress Notes (Signed)
Patient ID: [name], female   DOB: 10/07/1932, 80 y.o.   MRN: [id number] Postoperative day 1 status post amputation of left great toe and right second toe. Patient without complaints this morning. Patient stable for discharge to skilled nursing.

## 2014-06-04 NOTE — Evaluation (Signed)
Physical Therapy Evaluation Patient Details Name: [name] MRN: [id number] DOB: 03/06/1933 Today's Date: 06/04/2014   History of Present Illness  status post amputation of left great toe and right second toe due to cellulitits  Clinical Impression  Pt admitted with cellulitis, now s/p above surgery. Pt currently with functional limitations due to the deficits listed below (see PT Problem List).  Pt will benefit from skilled PT to increase their independence and safety with mobility to allow discharge to the venue listed below.   Lengthy conversation re: dc planning with pt and husband; at current functional level, recommend SNF to maximize independence and safety with mobility prior to return to ALF;  Still, husband is very clear that he would prefer that [name]; If [name] can accommodate [name]'s mobility and ADL needs, that is acceptable as well.       Follow Up Recommendations SNF;Supervision/Assistance - 24 hour (See above comments as well)    Equipment Recommendations  3in1 (PT);Wheelchair (measurements PT);Wheelchair cushion (measurements PT) (elevating legrests)    Recommendations for Other Services       Precautions / Restrictions Precautions Precautions: Fall Restrictions Weight Bearing Restrictions: Yes RLE Weight Bearing: Weight bearing as tolerated LLE Weight Bearing: Weight bearing as tolerated      Mobility  Bed Mobility Overal bed mobility: Needs Assistance Bed Mobility: Supine to Sit     Supine to sit: Min guard     General bed mobility comments: cues to initiate; slow moving, but not needing physical assist  Transfers Overall transfer level: Needs assistance Equipment used: Rolling walker (2 wheeled) Transfers: Sit to/from Stand Sit to Stand: Min assist         General transfer comment: Cues for safety and hand placement, min assist for steadiness  Ambulation/Gait Ambulation/Gait assistance: Min assist Ambulation  Distance (Feet):  ([facility]iner) Assistive device: Rolling walker (2 wheeled) Gait Pattern/deviations: Shuffle     General Gait Details: min physical assist to manage RW with turns; incontinent of bowels during transitions  Stairs            Wheelchair Mobility    Modified Rankin (Stroke Patients Only)       Balance Overall balance assessment: Needs assistance Sitting-balance support: Single extremity supported Sitting balance-Leahy Scale: Fair     Standing balance support: Bilateral upper extremity supported Standing balance-Leahy Scale: Poor                               Pertinent Vitals/Pain Pain Assessment: Faces Faces Pain Scale: Hurts little more Pain Location: bil feet and back; difficulty assigning a 0-10 value to her pain Pain Descriptors / Indicators: Aching Pain Intervention(s): Limited activity within patient's tolerance;Monitored during session;Repositioned;Premedicated before session    Home Living Family/patient expects to be discharged to:: Assisted living               Home Equipment: Grab bars - toilet;Grab bars - tub/shower;Shower seat;Walker - 4 wheels Additional Comments: Pt has a wheelchair, not sure if it has elevating legrests    Prior Function Level of Independence: Needs assistance   Gait / Transfers Assistance Needed: has been using wheelchair more lately, since R foot surgery in August; before that, was Modified independent with RW  ADL's / Homemaking Assistance Needed: Needed assistance occasionally for bath/dress.        Hand Dominance   Dominant Hand: Right  Extremity/Trunk Assessment   Upper Extremity Assessment: Generalized weakness           Lower Extremity Assessment: Generalized weakness      Cervical / Trunk Assessment: Kyphotic  Communication   Communication: No difficulties  Cognition Arousal/Alertness: Awake/alert Behavior During Therapy: WFL for tasks  assessed/performed;Anxious (anxious, almost perseverative re: moving bowels) Overall Cognitive Status: History of cognitive impairments - at baseline (pt's husband present, and he didn't indicate a change; pt herself lacks initiation; was almost perseverative re: moving her bowels; decreased ability to problem-solve)                      General Comments General comments (skin integrity, edema, etc.): extra time for cleaning up due to bowel incontinence    Exercises        Assessment/Plan    PT Assessment Patient needs continued PT services  PT Diagnosis Difficulty walking;Generalized weakness   PT Problem List Decreased strength;Decreased activity tolerance;Decreased balance;Decreased mobility;Decreased coordination;Decreased cognition;Decreased knowledge of use of DME;Decreased safety awareness;Pain;Decreased knowledge of precautions  PT Treatment Interventions DME instruction;Gait training;Functional mobility training;Therapeutic activities;Therapeutic exercise;Balance training;Patient/family education;Wheelchair mobility training   PT Goals (Current goals can be found in the Care Plan section) Acute Rehab PT Goals Patient Stated Goal: very focused on being able to get to the bathroom to move her bowels PT Goal Formulation: With patient/family Time For Goal Achievement: 06/18/14 Potential to Achieve Goals: Good    Frequency Min 3X/week   Barriers to discharge   Need to find out how much assist is available to [name]; (do they [name] or staff to assist her to the bathroom?)    Co-evaluation               End of Session Equipment Utilized During Treatment: Gait belt Activity Tolerance: Patient tolerated treatment well Patient left: in chair;with call bell/phone within reach;with family/visitor present Nurse Communication: Mobility status         Time: 1416-1500 PT Time Calculation (min): 44 min   Charges:    PT Evaluation $Initial PT Evaluation Tier I: 1 Procedure PT Treatments $Gait Training: 8-22 mins $Therapeutic Activity: 23-37 mins   PT G Codes:          Olen PelGarrigan, Anajah Sterbenz Hamff 06/04/2014, 4:13 PM  Van ClinesHolly Jalecia Leon, [place]  Acute Rehabilitation Services Pager [phone] Office [phone]

## 2014-06-04 NOTE — Progress Notes (Signed)
Utilization review completed.  

## 2014-06-04 NOTE — Progress Notes (Signed)
OT Cancellation Note  Patient Details Name: [name] MRN: [id number] DOB: 03/04/1933   Cancelled Treatment:    Reason Eval/Treat Not Completed: Other (comment) Pt eating dinner. Will attempt evaluation tomorrow.  Earlie RavelingStraub, Shawnese Magner L OTR/L [phone] 06/04/2014, 5:13 PM

## 2014-06-04 NOTE — Progress Notes (Signed)
[facility] PROGRESS NOTE  Tonya ChildsBertha L [name] ZOX:[id number] DOB: 10/06/1932 DOA: 06/01/2014 PCP: Lillia MountainGRIFFIN,JOHN JOSEPH, MD  Assessment/Plan: #1 right foot cellulitis/acute osteomyelitis of the left great toe/chronic osteomyelitis of the right foot  Sedimentation rate and CRP elevated. Patient status post ABIs/TBI. Cellulitis improving. Patient s/p left great toe and right second toe amputation 06/03/14. Continue empiric IV vancomycin IV Zosyn. Orthopedic ff.  #2 well controlled diabetes mellitus Hemoglobin A1c 5.9. CBGs are ranging from 100 -223. Continue home dose Lantus and sliding scale insulin.   #3 hypertension Stable. Follow.  #4 gastroesophageal reflux disease PPI.  #5 depression Continue Cymbalta  #6 prophylaxis PPI for GI prophylaxis. Lovenox for DVT prophylaxis.  Code Status: Full Family Communication: Updated patient and husband at bedside.  Disposition Plan: SNF vs HH   Consultants:  Orthopedics: Dr. Lajoyce Cornersuda  06/02/14  Procedures:  X-ray of the right foot 06/01/2014  X-ray of the left great toe 06/01/2014  ABIs/TBI 06/02/2014  Amputation left great toe and second right toe 06/03/14 Dr Lajoyce Cornersuda  Antibiotics:  IV vancomycin 06/01/2014  IV Zosyn 06/01/2014  HPI/Subjective: Patient states not feeling well. Patient with emesis this morning.   Objective: Filed Vitals:   06/04/14 0543  BP: 143/63  Pulse: 82  Temp: 98.4 F (36.9 C)  Resp: 16    Intake/Output Summary (Last 24 hours) at 06/04/14 1007 Last data filed at 06/04/14 0000  Gross per 24 hour  Intake  8119124380 ml  Output      0 ml  Net  24380 ml   Filed Weights   06/01/14 2113 06/03/14 0527 06/04/14 0543  Weight: 68 kg (149 lb 14.6 oz) 59.7 kg (131 lb 9.8 oz) 56.836 kg (125 lb 4.8 oz)    Exam:   General:  NAD  Cardiovascular: RRR  Respiratory: CTAB ANTERIOR LUNG FIELDS  Abdomen: Soft, nontender, nondistended, positive bowel sounds.  Musculoskeletal: Coban wraps  bilaterally.  Data Reviewed: Basic Metabolic Panel:  Recent Labs Lab 06/01/14 1440 06/01/14 1442 06/02/14 0444 06/04/14 0614  NA  --  138 138 141  K  --  4.0 3.8 3.7  CL  --  95* 100 101  CO2  --  34* 30 32  GLUCOSE  --  60* 152* 99  BUN  --  25* 16 14  CREATININE  --  0.67 0.56 0.70  CALCIUM  --  9.9 8.8 8.7  MG 2.0  --   --   --    Liver Function Tests:  Recent Labs Lab 06/01/14 1533 06/02/14 0444  AST  --  13  ALT  --  8  ALKPHOS  --  112  BILITOT  --  0.3  PROT  --  6.2  ALBUMIN 3.5 2.8*   No results found for this basename: LIPASE, AMYLASE,  in the last 168 hours No results found for this basename: AMMONIA,  in the last 168 hours CBC:  Recent Labs Lab 06/01/14 1442 06/02/14 0444 06/04/14 0614  WBC 7.6 5.0 6.7  HGB 12.9 11.1* 11.5*  HCT 40.2 33.3* 36.1  MCV 86.6 84.1 86.2  PLT 350 264 268   Cardiac Enzymes: No results found for this basename: CKTOTAL, CKMB, CKMBINDEX, TROPONINI,  in the last 168 hours BNP (last 3 results) No results found for this basename: PROBNP,  in the last 8760 hours CBG:  Recent Labs Lab 06/03/14 0809 06/03/14 1148 06/03/14 1640 06/03/14 2155 06/04/14 0643  GLUCAP 98 141* 186* 223* 100*    Recent Results (from the past 240  hour(s))  CULTURE, BLOOD (ROUTINE X 2)     Status: None   Collection Time    06/01/14  4:55 PM      Result Value Ref Range Status   Specimen Description BLOOD LEFT ANTECUBITAL   Final   Special Requests BOTTLES DRAWN AEROBIC AND ANAEROBIC 5ML   Final   Culture  Setup Time     Final   Value: 06/01/2014 21:58     Performed at [facility]   Culture     Final   Value:        BLOOD CULTURE RECEIVED NO GROWTH TO DATE CULTURE WILL BE HELD FOR 5 DAYS BEFORE ISSUING A FINAL NEGATIVE REPORT     Performed at [facility]   Report Status PENDING   Incomplete  CULTURE, BLOOD (ROUTINE X 2)     Status: None   Collection Time    06/01/14  5:22 PM      Result Value Ref Range Status    Specimen Description BLOOD RIGHT ANTECUBITAL   Final   Special Requests BOTTLES DRAWN AEROBIC AND ANAEROBIC 5ML   Final   Culture  Setup Time     Final   Value: 06/01/2014 21:58     Performed at [facility]   Culture     Final   Value:        BLOOD CULTURE RECEIVED NO GROWTH TO DATE CULTURE WILL BE HELD FOR 5 DAYS BEFORE ISSUING A FINAL NEGATIVE REPORT     Performed at [facility]   Report Status PENDING   Incomplete  SURGICAL PCR SCREEN     Status: Abnormal   Collection Time    06/02/14 10:26 PM      Result Value Ref Range Status   MRSA, PCR POSITIVE (*) NEGATIVE Final   Comment: RESULT CALLED TO, READ BACK BY AND VERIFIED WITH:     C.BEVERLY,RN AT 0135 ON 06/03/14 BY [email or web address]   Staphylococcus aureus POSITIVE (*) NEGATIVE Final   Comment:            The Xpert SA Assay (FDA     approved for NASAL specimens     in patients over 78 years of age),     is one component of     a comprehensive surveillance     program.  Test performance has     been validated by [facility] for patients greater     than or equal to 78 year old.     It is not intended     to diagnose infection nor to     guide or monitor treatment.     Studies: No results found.  Scheduled Meds: . aspirin EC  81 mg Oral q morning - 10a  . calcium-vitamin D  1 tablet Oral Q breakfast  . Chlorhexidine Gluconate Cloth  6 each Topical Q0600  . cholecalciferol  1,000 Units Oral q morning - 10a  . conjugated estrogens  1 Applicatorful Vaginal Once per day on Mon Thu  . docusate sodium  100 mg Oral BID  . DULoxetine  30 mg Oral q morning - 10a  . enoxaparin (LOVENOX) injection  40 mg Subcutaneous Q24H  . feeding supplement (GLUCERNA SHAKE)  237 mL Oral TID BM  . insulin aspart  0-9 Units Subcutaneous TID WC  . insulin glargine  16 Units Subcutaneous QHS  . iron polysaccharides  150 mg Oral BID  . loratadine  10 mg  Oral Daily  . LORazepam  0.5 mg Oral Q12H  . morphine  15 mg Oral Q12H  .  mupirocin ointment  1 application Nasal BID  . pantoprazole  40 mg Oral Q0600  . piperacillin-tazobactam (ZOSYN)  IV  3.375 g Intravenous Q8H  . potassium chloride SA  20 mEq Oral q morning - 10a  . senna-docusate  2 tablet Oral QHS  . vancomycin  500 mg Intravenous Q12H   Continuous Infusions: . sodium chloride 10 mL/hr at 06/04/14 0000    Principal Problem:   Cellulitis of right foot Active Problems:   Acute osteomyelitis of toe of left foot   Diabetes mellitus   HTN (hypertension)   Acute osteomyelitis of toe of right foot   GERD (gastroesophageal reflux disease)   Depression   Cellulitis of foot, right    Time spent: 49 MINS    [facility] MD [facility] Pager [phone]. If 7PM-7AM, please contact night-coverage at [email or web address], password [facility] 06/04/2014, 10:07 AM  LOS: 3 days

## 2014-06-05 ENCOUNTER — Encounter (HOSPITAL_COMMUNITY): Payer: Self-pay | Admitting: Orthopedic Surgery

## 2014-06-05 DIAGNOSIS — L97509 Non-pressure chronic ulcer of other part of unspecified foot with unspecified severity: Secondary | ICD-10-CM

## 2014-06-05 DIAGNOSIS — E08621 Diabetes mellitus due to underlying condition with foot ulcer: Secondary | ICD-10-CM

## 2014-06-05 LAB — CBC
HEMATOCRIT: 35.5 % — AB (ref 36.0–46.0)
HEMOGLOBIN: 11.4 g/dL — AB (ref 12.0–15.0)
MCH: 27.9 pg (ref 26.0–34.0)
MCHC: 32.1 g/dL (ref 30.0–36.0)
MCV: 87 fL (ref 78.0–100.0)
Platelets: 234 10*3/uL (ref 150–400)
RBC: 4.08 MIL/uL (ref 3.87–5.11)
RDW: 13.4 % (ref 11.5–15.5)
WBC: 6.4 10*3/uL (ref 4.0–10.5)

## 2014-06-05 LAB — BASIC METABOLIC PANEL
Anion gap: 11 (ref 5–15)
BUN: 15 mg/dL (ref 6–23)
CHLORIDE: 100 meq/L (ref 96–112)
CO2: 30 meq/L (ref 19–32)
Calcium: 8.7 mg/dL (ref 8.4–10.5)
Creatinine, Ser: 0.76 mg/dL (ref 0.50–1.10)
GFR calc Af Amer: >90 mL/min (ref >90)
GFR calc non Af Amer: 78 mL/min — ABNORMAL LOW (ref >90)
GLUCOSE: 75 mg/dL (ref 70–99)
POTASSIUM: 4.1 meq/L (ref 3.7–5.3)
SODIUM: 141 meq/L (ref 137–147)

## 2014-06-05 LAB — GLUCOSE, CAPILLARY
GLUCOSE-CAPILLARY: 58 mg/dL — AB (ref 70–99)
GLUCOSE-CAPILLARY: 252 mg/dL — AB (ref 70–99)
Glucose-Capillary: 135 mg/dL — ABNORMAL HIGH (ref 70–99)
Glucose-Capillary: 214 mg/dL — ABNORMAL HIGH (ref 70–99)
Glucose-Capillary: 188 mg/dL — ABNORMAL HIGH (ref 70–99)

## 2014-06-05 MED ORDER — INSULIN GLARGINE 100 UNIT/ML ~~LOC~~ SOLN
10.0000 [IU] | Freq: Every day | SUBCUTANEOUS | Status: DC
  Administered 2014-06-05: 10 [IU] via SUBCUTANEOUS
  Filled 2014-06-05 (×2): qty 0.1

## 2014-06-05 MED ORDER — DOXYCYCLINE HYCLATE 100 MG PO TABS: 100.0000 mg | ORAL_TABLET | Freq: Two times a day (BID) | ORAL | Status: DC

## 2014-06-05 MED ORDER — FUROSEMIDE 10 MG/ML IJ SOLN
20.0000 mg | Freq: Once | INTRAMUSCULAR | Status: AC
  Administered 2014-06-05: 20 mg via INTRAVENOUS
  Filled 2014-06-05: qty 2

## 2014-06-05 MED ORDER — DOXYCYCLINE HYCLATE 100 MG PO TABS
100.0000 mg | ORAL_TABLET | Freq: Two times a day (BID) | ORAL | Status: DC
  Administered 2014-06-05 – 2014-06-06 (×2): 100 mg via ORAL
  Filled 2014-06-05 (×4): qty 1

## 2014-06-05 NOTE — Progress Notes (Signed)
Physical Therapy Treatment Patient Details Name: Tonya ChildsBertha L [name] MRN: [id number] DOB: 05/01/1933 Today's Date: 06/05/2014    History of Present Illness status post amputation of left great toe and right second toe    PT Comments    Making progress with functional mobility and activity tolerance; given improvements today, I'm more inclined for pt to return to ALF (with familiar staff and routine) -- provided they can accommodate her needs; I believe they are reviewing her case sometime today;   It is worth considering bilateral postop shoes, or even Darco shoes, to protect her feet and unweigh her forefeet when standing; discussed this with pt and husband; Mr. Phillips ClimesBrame indicated that she has gotten special shoes before, and that she doesn't use them; My concern would be that bil Darco shoes could throw off her balance more; Still it is worth considering them if we want to prioritize unweighing fore feet during amb; she can function at a wheelchair level as well (and apparently has been)   Follow Up Recommendations  Home health PT;Supervision/Assistance - 24 hour (HHPT at ALF if ALF can accommodate her needs; if ALF cannot, must pursue SNF)     Equipment Recommendations  3in1 (PT);Wheelchair (measurements PT);Wheelchair cushion (measurements PT) (elevating legrests)    Recommendations for Other Services OT consult (Discussed case with OT)     Precautions / Restrictions Precautions Precautions: Fall Restrictions RLE Weight Bearing: Weight bearing as tolerated LLE Weight Bearing: Weight bearing as tolerated    Mobility  Bed Mobility Overal bed mobility: Needs Assistance Bed Mobility: Supine to Sit     Supine to sit: Min guard     General bed mobility comments: cues to initiate; slow moving, but not needing physical assist  Transfers Overall transfer level: Needs assistance Equipment used: Rolling walker (2 wheeled) Transfers: Sit to/from Stand Sit to Stand: Min assist          General transfer comment: Cues for safety and hand placement, min assist for steadiness; with fatigue, noted tendency to lean posteriorly, requiring a hand to steady and prevent a hard sit back to the chair  Ambulation/Gait Ambulation/Gait assistance: Min assist Ambulation Distance (Feet): 40 Feet Assistive device: Rolling walker (2 wheeled) Gait Pattern/deviations: Step-through pattern;Trunk flexed     General Gait Details: Cued to keep weight on heels, especially if toes and forefeet hurt -- this affects her balance, and she reqires minimal steady assist while on her feet   Stairs            Wheelchair Mobility    Modified Rankin (Stroke Patients Only)       Balance     Sitting balance-Leahy Scale: Fair       Standing balance-Leahy Scale: Poor                      Cognition Arousal/Alertness: Awake/alert Behavior During Therapy: Flat affect;WFL for tasks assessed/performed Overall Cognitive Status: No family/caregiver present to determine baseline cognitive functioning                      Exercises      General Comments        Pertinent Vitals/Pain Pain Assessment: Faces Faces Pain Scale: Hurts little more Pain Location: bil feet/toes Pain Descriptors / Indicators: Sore Pain Intervention(s): Monitored during session;Repositioned    Home Living                      Prior Function  PT Goals (current goals can now be found in the care plan section) Acute Rehab PT Goals Patient Stated Goal: very focused on being able to get to the bathroom to move her bowels PT Goal Formulation: With patient/family Time For Goal Achievement: 06/18/14 Potential to Achieve Goals: Good    Frequency  Min 3X/week    PT Plan Discharge plan needs to be updated;Other (comment) (See Comments)    Co-evaluation             End of Session Equipment Utilized During Treatment: Gait belt Activity Tolerance: Patient tolerated  treatment well Patient left: in chair;with call bell/phone within reach;with family/visitor present     Time: 0900-0930 PT Time Calculation (min): 30 min  Charges:  $Gait Training: 8-22 mins $Therapeutic Activity: 8-22 mins                    G Codes:      Van ClinesGarrigan, Camille Thau Hamff 06/05/2014, 10:06 AM  Van ClinesHolly Alexi Dorminey, PT  Acute Rehabilitation Services Pager [phone] Office [phone]

## 2014-06-05 NOTE — Clinical Social Work Note (Signed)
Pt from [facility] on [address] [phone]).  Pt and husband request to return to [facility] with home health services.  CSW contacted ALF to inquire if a face-to-face visit from RN.  CSW consulted RNCM as well for possible home health needs.  Per report from previous [facility] admission, [name] may [facility] their own home health services.  That contact is [name] and she can be reached at [phone].  RNCM made aware.  CSW awaiting a return call from [facility].   Vickii PennaGina Briahna Pescador, LCSWA [phone]  Psychiatric & Orthopedics (5N 1-16) Clinical Social Worker

## 2014-06-05 NOTE — Evaluation (Signed)
Occupational Therapy Evaluation Patient Details Name: [name] MRN: [id number] DOB: 02/11/1933 Today's Date: 06/05/2014    History of Present Illness status post amputation of left great toe and right second toe   Clinical Impression   Pt admitted with the above diagnoses and presents with below problem list. Pt seen for acute OT eval. PTA pt lived in an assisted living facility and needed some assistance with bathing and dressing. Pt currently at min/mod A for LB ADLs. Unsure level of care ALF is able to provide. If ALf unable to provided needed level of care then recommend SNF.      Follow Up Recommendations  SNF;Other (comment) (or return to ALF if able to provide current level of care)    Equipment Recommendations  Other (comment) (defer to next venue )    Recommendations for Other Services       Precautions / Restrictions Precautions Precautions: Fall Restrictions Weight Bearing Restrictions: Yes RLE Weight Bearing: Weight bearing as tolerated LLE Weight Bearing: Weight bearing as tolerated      Mobility Bed Mobility      General bed mobility comments: in recliner  Transfers Overall transfer level: Needs assistance Equipment used: Rolling walker (2 wheeled) Transfers: Sit to/from Stand Sit to Stand: Min guard         General transfer comment: close min guard, cues for technique, no physical assist    Balance Overall balance assessment: Needs assistance   Sitting balance-Leahy Scale: Fair     Standing balance support: Bilateral upper extremity supported;During functional activity Standing balance-Leahy Scale: Poor Standing balance comment: needs rw for balance                            ADL Overall ADL's : Needs assistance/impaired Eating/Feeding: Set up;Sitting   Grooming: Set up;Sitting   Upper Body Bathing: Supervision/ safety;Sitting   Lower Body Bathing: Moderate assistance;Sit to/from stand   Upper Body Dressing :  Supervision/safety;Sitting   Lower Body Dressing: Moderate assistance;Sit to/from stand   Toilet Transfer: Minimal assistance;Ambulation;RW;BSC   Toileting- Clothing Manipulation and Hygiene: Minimal assistance;Sit to/from stand   Tub/ Shower Transfer: Minimal assistance;Ambulation;3 in 1;Rolling walker;Shower seat     General ADL Comments: Pt at min-mod A level for LB ADLs. Pt very concerned about bowel incontinince she has been experiencing with mobility.     Vision                     Perception     Praxis      Pertinent Vitals/Pain Pain Assessment: Faces Faces Pain Scale: Hurts little more Pain Location: surgical sites, some soreness in back in standing Pain Descriptors / Indicators: Sore Pain Intervention(s): Monitored during session;Repositioned     Hand Dominance Right   Extremity/Trunk Assessment Upper Extremity Assessment Upper Extremity Assessment: Generalized weakness   Lower Extremity Assessment Lower Extremity Assessment: Defer to PT evaluation       Communication Communication Communication: No difficulties   Cognition Arousal/Alertness: Awake/alert Behavior During Therapy: Flat affect;Anxious;WFL for tasks assessed/performed Overall Cognitive Status: History of cognitive impairments - at baseline                     General Comments       Exercises       Shoulder Instructions      Home Living Family/patient expects to be discharged to:: Assisted living  Home Equipment: Grab bars - toilet;Grab bars - tub/shower;Shower seat;Walker - 4 wheels   Additional Comments: Pt has a wheelchair, not sure if it has elevating legrests      Prior Functioning/Environment Level of Independence: Needs assistance    ADL's / Homemaking Assistance Needed: pt reports some assistance with dressing and bathing        OT Diagnosis: Generalized weakness;Acute pain   OT Problem List: Impaired balance  (sitting and/or standing);Decreased knowledge of use of DME or AE;Decreased knowledge of precautions;Pain;Decreased cognition   OT Treatment/Interventions:      OT Goals(Current goals can be found in the care plan section) Acute Rehab OT Goals Patient Stated Goal: very focused on being able to get to the bathroom to move her bowels  OT Frequency:     Barriers to D/C:            Co-evaluation              End of Session Equipment Utilized During Treatment: Gait belt;Rolling walker Nurse Communication: Other (comment) (incontinence)  Activity Tolerance: Patient limited by fatigue Patient left: in chair;with call bell/phone within reach;with family/visitor present   Time: 1023-1040 OT Time Calculation (min): 17 min Charges:  OT General Charges $OT Visit: 1 Procedure OT Evaluation $Initial OT Evaluation Tier I: 1 Procedure OT Treatments $Self Care/Home Management : 8-22 mins G-Codes:    [name], [name] 06/05/2014, 10:58 AM

## 2014-06-05 NOTE — Clinical Social Work Psychosocial (Signed)
Clinical Social Work Department BRIEF PSYCHOSOCIAL ASSESSMENT 06/05/2014  Patient:  Len ChildsBRAME,Destynie L     Account Number:  [id number]     Admit date:  06/01/2014  Clinical Social Worker:  Read DriversINGLE,Tayla Panozzo, LCSWA  Date/Time:  06/05/2014 09:50 AM  Referred by:  Physician  Date Referred:  06/05/2014 Referred for  SNF Placement  Psychosocial assessment   Other Referral:   none   Interview type:  Other - See comment Other interview type:   patient and patient's husband    PSYCHOSOCIAL DATA Living Status:  FACILITY Admitted from facility:  Other Level of care:  Assisted Living Primary support name:  Omelia BlackwaterLuther Primary support relationship to patient:  SPOUSE Degree of support available:   adequate    CURRENT CONCERNS Current Concerns  Post-Acute Placement   Other Concerns:   none    SOCIAL WORK ASSESSMENT / PLAN [facility] is [facility] on [address] [phone].  CSW has spoken directly with PT who reports that patient (pt) is minimal assist.  PT recommends min guard assistance during ambulations.  CSW spoke with pt and pt's husband regarding PT recommendations of possibly returning [facility] with assistance from the ALF/HH.  Pt and husband's first choice is to return to [facility] with home health [facility](HH).  CSW will consult RNCM.  CSW contacted [name] and left message with RN who will need to do a face-to-face evaluation prior to dc.  CSW awaiting a return call.   Assessment/plan status:  Psychosocial Support/Ongoing Assessment of Needs Other assessment/ plan:   FL2  PASARR-should be existing   Information/referral to community resources:   SNF/STR    PATIENT'S/FAMILY'S RESPONSE TO PLAN OF CARE: Pt is agreeable to first choice being to return [facility] [address] [facility]       Vickii PennaGina Daja Shuping, LCSWA [phone]  Psychiatric & Orthopedics (5N 1-16) Clinical Social Worker

## 2014-06-05 NOTE — Progress Notes (Signed)
[facility] PROGRESS NOTE  [name] UEA:[id number] DOB: 18-Mar-1933 DOA: 06/01/2014 PCP: Lillia Mountain, MD  Assessment/Plan: #1 right foot cellulitis/acute osteomyelitis of the left great toe/chronic osteomyelitis of the right foot  Sedimentation rate and CRP elevated. Patient status post ABIs/TBI. Cellulitis improving. Patient s/p left great toe and right second toe amputation 06/03/14. D/C IV vancomycin and  Zosyn. Start oral doxycycline to complete 7 day course. Outpatient f/u with Dr Lajoyce Corners, orthopedics.   #2 well controlled diabetes mellitus Hemoglobin A1c 5.9. CBGs are ranging from 58 -214. Decrease Lantus to 10 units QHS and continue sliding scale insulin.   #3 hypertension Stable. Follow.  #4 gastroesophageal reflux disease PPI.  #5 depression Continue Cymbalta  #6 prophylaxis PPI for GI prophylaxis. Lovenox for DVT prophylaxis.  Code Status: Full Family Communication: Updated patient and husband at bedside.  Disposition Plan: SNF vs HH at ALF hopefully tomorrow.   Consultants:  Orthopedics: Dr. Lajoyce Corners  06/02/14  Procedures:  X-ray of the right foot 06/01/2014  X-ray of the left great toe 06/01/2014  ABIs/TBI 06/02/2014  Amputation left great toe and second right toe 06/03/14 Dr Lajoyce Corners  Antibiotics:  IV vancomycin 06/01/2014>>>06/05/14  IV Zosyn 06/01/2014>>>06/05/14  Oral doxycycline  HPI/Subjective: Patient states feeling better today. No emesis.   Objective: Filed Vitals:   06/05/14 0600  BP: 138/56  Pulse: 62  Temp: 98.2 F (36.8 C)  Resp: 16    Intake/Output Summary (Last 24 hours) at 06/05/14 1345 Last data filed at 06/05/14 0654  Gross per 24 hour  Intake    395 ml  Output      0 ml  Net    395 ml   Filed Weights   06/01/14 2113 06/03/14 0527 06/04/14 0543  Weight: 68 kg (149 lb 14.6 oz) 59.7 kg (131 lb 9.8 oz) 56.836 kg (125 lb 4.8 oz)    Exam:   General:  NAD  Cardiovascular: RRR  Respiratory: CTAB ANTERIOR  LUNG FIELDS  Abdomen: Soft, nontender, nondistended, positive bowel sounds.  Musculoskeletal: Coban wraps bilaterally.  Data Reviewed: Basic Metabolic Panel:  Recent Labs Lab 06/01/14 1440 06/01/14 1442 06/02/14 0444 06/04/14 0614 06/05/14 0638  NA  --  138 138 141 141  K  --  4.0 3.8 3.7 4.1  CL  --  95* 100 101 100  CO2  --  34* 30 32 30  GLUCOSE  --  60* 152* 99 75  BUN  --  25* 16 14 15   CREATININE  --  0.67 0.56 0.70 0.76  CALCIUM  --  9.9 8.8 8.7 8.7  MG 2.0  --   --   --   --    Liver Function Tests:  Recent Labs Lab 06/01/14 1533 06/02/14 0444  AST  --  13  ALT  --  8  ALKPHOS  --  112  BILITOT  --  0.3  PROT  --  6.2  ALBUMIN 3.5 2.8*   No results found for this basename: LIPASE, AMYLASE,  in the last 168 hours No results found for this basename: AMMONIA,  in the last 168 hours CBC:  Recent Labs Lab 06/01/14 1442 06/02/14 0444 06/04/14 0614 06/05/14 0638  WBC 7.6 5.0 6.7 6.4  HGB 12.9 11.1* 11.5* 11.4*  HCT 40.2 33.3* 36.1 35.5*  MCV 86.6 84.1 86.2 87.0  PLT 350 264 268 234   Cardiac Enzymes: No results found for this basename: CKTOTAL, CKMB, CKMBINDEX, TROPONINI,  in the last 168 hours BNP (last 3 results)  No results found for this basename: PROBNP,  in the last 8760 hours CBG:  Recent Labs Lab 06/04/14 1650 06/04/14 2122 06/05/14 0656 06/05/14 0744 06/05/14 1133  GLUCAP 178* 163* 58* 135* 214*    Recent Results (from the past 240 hour(s))  CULTURE, BLOOD (ROUTINE X 2)     Status: None   Collection Time    06/01/14  4:55 PM      Result Value Ref Range Status   Specimen Description BLOOD LEFT ANTECUBITAL   Final   Special Requests BOTTLES DRAWN AEROBIC AND ANAEROBIC 5ML   Final   Culture  Setup Time     Final   Value: 06/01/2014 21:58     Performed at [facility]   Culture     Final   Value:        BLOOD CULTURE RECEIVED NO GROWTH TO DATE CULTURE WILL BE HELD FOR 5 DAYS BEFORE ISSUING A FINAL NEGATIVE REPORT      Performed at [facility]   Report Status PENDING   Incomplete  CULTURE, BLOOD (ROUTINE X 2)     Status: None   Collection Time    06/01/14  5:22 PM      Result Value Ref Range Status   Specimen Description BLOOD RIGHT ANTECUBITAL   Final   Special Requests BOTTLES DRAWN AEROBIC AND ANAEROBIC 5ML   Final   Culture  Setup Time     Final   Value: 06/01/2014 21:58     Performed at [facility]   Culture     Final   Value:        BLOOD CULTURE RECEIVED NO GROWTH TO DATE CULTURE WILL BE HELD FOR 5 DAYS BEFORE ISSUING A FINAL NEGATIVE REPORT     Performed at [facility]   Report Status PENDING   Incomplete  SURGICAL PCR SCREEN     Status: Abnormal   Collection Time    06/02/14 10:26 PM      Result Value Ref Range Status   MRSA, PCR POSITIVE (*) NEGATIVE Final   Comment: RESULT CALLED TO, READ BACK BY AND VERIFIED WITH:     C.BEVERLY,RN AT 0135 ON 06/03/14 BY SHEA.W   Staphylococcus aureus POSITIVE (*) NEGATIVE Final   Comment:            The Xpert SA Assay (FDA     approved for NASAL specimens     in patients over 78 years of age),     is one component of     a comprehensive surveillance     program.  Test performance has     been validated by [facility] for patients greater     than or equal to 78 year old.     It is not intended     to diagnose infection nor to     guide or monitor treatment.     Studies: No results found.  Scheduled Meds: . aspirin EC  81 mg Oral q morning - 10a  . calcium-vitamin D  1 tablet Oral Q breakfast  . Chlorhexidine Gluconate Cloth  6 each Topical Q0600  . cholecalciferol  1,000 Units Oral q morning - 10a  . conjugated estrogens  1 Applicatorful Vaginal Once per day on Mon Thu  . docusate sodium  100 mg Oral BID  . DULoxetine  30 mg Oral q morning - 10a  . enoxaparin (LOVENOX) injection  40 mg Subcutaneous Q24H  .  feeding supplement (GLUCERNA SHAKE)  237 mL Oral TID BM  . insulin aspart  0-9 Units Subcutaneous  TID WC  . insulin glargine  10 Units Subcutaneous QHS  . iron polysaccharides  150 mg Oral BID  . loratadine  10 mg Oral Daily  . LORazepam  0.5 mg Oral Q12H  . morphine  15 mg Oral Q12H  . mupirocin ointment  1 application Nasal BID  . pantoprazole  40 mg Oral Q0600  . piperacillin-tazobactam (ZOSYN)  IV  3.375 g Intravenous Q8H  . potassium chloride SA  20 mEq Oral q morning - 10a  . senna-docusate  2 tablet Oral QHS  . vancomycin  500 mg Intravenous Q12H   Continuous Infusions: . sodium chloride 10 mL/hr at 06/04/14 0000    Principal Problem:   Cellulitis of right foot Active Problems:   Acute osteomyelitis of toe of left foot   Diabetes mellitus   HTN (hypertension)   Acute osteomyelitis of toe of right foot   GERD (gastroesophageal reflux disease)   Depression   Cellulitis of foot, right   Malnutrition of moderate degree    Time spent: 2535 MINS    [facility] CenterHOMPSON,DANIEL MD [facility] Pager [phone]. If 7PM-7AM, please contact night-coverage at [email or web address], password [email or web address] 06/05/2014, 1:45 PM  LOS: 4 days

## 2014-06-05 NOTE — Progress Notes (Signed)
Low blood sugar this morning. CBG 58. Patient asymptomatic, snack given, blood sugar rechecked. CBG now 135.

## 2014-06-06 DIAGNOSIS — M79662 Pain in left lower leg: Secondary | ICD-10-CM

## 2014-06-06 DIAGNOSIS — M7989 Other specified soft tissue disorders: Secondary | ICD-10-CM

## 2014-06-06 LAB — BASIC METABOLIC PANEL
Anion gap: 8 (ref 5–15)
BUN: 16 mg/dL (ref 6–23)
CALCIUM: 8.7 mg/dL (ref 8.4–10.5)
CO2: 33 mEq/L — ABNORMAL HIGH (ref 19–32)
Chloride: 99 mEq/L (ref 96–112)
Creatinine, Ser: 0.56 mg/dL (ref 0.50–1.10)
GFR calc Af Amer: >90 mL/min (ref >90)
GFR calc non Af Amer: 86 mL/min — ABNORMAL LOW (ref >90)
Glucose, Bld: 153 mg/dL — ABNORMAL HIGH (ref 70–99)
POTASSIUM: 3.7 meq/L (ref 3.7–5.3)
Sodium: 140 mEq/L (ref 137–147)

## 2014-06-06 LAB — GLUCOSE, CAPILLARY
GLUCOSE-CAPILLARY: 334 mg/dL — AB (ref 70–99)
Glucose-Capillary: 147 mg/dL — ABNORMAL HIGH (ref 70–99)

## 2014-06-06 MED ORDER — DSS 100 MG PO CAPS: 100.0000 mg | ORAL_CAPSULE | Freq: Two times a day (BID) | ORAL | Status: DC

## 2014-06-06 MED ORDER — MORPHINE SULFATE ER 15 MG PO TBCR: 15.0000 mg | EXTENDED_RELEASE_TABLET | Freq: Two times a day (BID) | ORAL | Status: DC

## 2014-06-06 MED ORDER — OXYCODONE-ACETAMINOPHEN 5-325 MG PO TABS: 1.0000 | ORAL_TABLET | ORAL | Status: DC | PRN

## 2014-06-06 MED ORDER — DOXYCYCLINE HYCLATE 100 MG PO TABS: 100.0000 mg | ORAL_TABLET | Freq: Two times a day (BID) | ORAL | Status: DC

## 2014-06-06 MED ORDER — INSULIN GLARGINE 100 UNIT/ML ~~LOC~~ SOLN: 10.0000 [IU] | Freq: Every day | SUBCUTANEOUS | Status: DC

## 2014-06-06 NOTE — Progress Notes (Signed)
Inpatient Diabetes Program Recommendations  AACE/ADA: New Consensus Statement on Inpatient Glycemic Control (2013)  Target Ranges:  Prepandial:   less than 140 mg/dL      Peak postprandial:   less than 180 mg/dL (1-2 hours)      Critically ill patients:  140 - 180 mg/dL  Results for [name], [name] (MRN [id number]) as of 06/06/2014 12:05  Ref. Range 06/05/2014 07:44 06/05/2014 11:33 06/05/2014 17:02 06/05/2014 21:24 06/06/2014 06:22  Glucose-Capillary Latest Range: 70-99 mg/dL 409135 (H) 811214 (H) 914188 (H) 252 (H) 147 (H)   Diabetes history: Diabetes/Cellulitis and amputation of both Right foot second toe and left foot great toe  Outpatient Diabetes medications: Lantus 16 units daily, Novolog 5 units TID with meals  Current orders for Inpatient glycemic control: Lantus 10 units QHS, Novolog 0-9 units TID with meals  Inpatient Diabetes Program Recommendations Insulin - Meal Coverage: Please consider ordering Novolog 3 units TID with meals for meal coverage.  Thanks, Orlando PennerMarie Tinita Brooker, RN, MSN, CCRN Diabetes Coordinator Inpatient Diabetes Program [phone] (Team Pager) [phone] (AP office) [phone] [facility](MC office)

## 2014-06-06 NOTE — Clinical Social Work Note (Signed)
CSW confirmed with Genie at [facility] that pt is able to return.  Genie states that [name] house [facility].  RNCM aware and will coordinate orders with ALF.  Projected dc (as of yesterday) is today.  MD paged and aware that ALF is ready for pt if medically stable and ready for dc.    Vickii PennaGina Juni Glaab, LCSWA [phone]  Psychiatric & Orthopedics (5N 1-16) Clinical Social Worker

## 2014-06-06 NOTE — Progress Notes (Signed)
Report called to Boneta LucksJenny, nurse at [facility]. Patient ready for discharge. EMS will transport.

## 2014-06-06 NOTE — Discharge Summary (Signed)
Physician Discharge Summary  [name] ZOX:[id number] DOB: 1932-10-04 DOA: 06/01/2014  PCP: Lillia Mountain, MD  Admit date: 06/01/2014 Discharge date: 06/06/2014  Time spent: > 35  minutes  Recommendations for Outpatient Follow-up:  1. Please continue to monitor wound 2. Ensure patient follows up with her orthopaedic surgeon after discharge. Call their offices to ensure preference regarding follow up date  Discharge Diagnoses:  Please see list below.  Discharge Condition: stable  Diet recommendation: low sodium heart healthy and carb modified.  Filed Weights   06/03/14 0527 06/04/14 0543 06/06/14 0500  Weight: 59.7 kg (131 lb 9.8 oz) 56.836 kg (125 lb 4.8 oz) 59.149 kg (130 lb 6.4 oz)    History of present illness:  80 year old who presented to the ED complaining of right foot swelling and pain. Evaluation in the ED with plain films bilaterally of her feet were concerning for osteomyelitis. Patient was admitted and orthopedic surgery was consulted.  Hospital Course:  #1 right foot cellulitis/acute osteomyelitis of the left great toe/chronic osteomyelitis of the right foot  Sedimentation rate and CRP elevated. Patient status post ABIs/TBI. Cellulitis improving. Patient s/p left great toe and right second toe amputation 06/03/14. D/C'd IV vancomycin and Zosyn. Started oral doxycycline to complete 7 day course. Outpatient f/u with Dr Lajoyce Corners, orthopedics.   #2 well controlled diabetes mellitus   Decrease Lantus to 10 units QHS, patient to continue diabetic  Diet.  #3 hypertension  Stable. Follow.   #4 gastroesophageal reflux disease  PPI, stable  #5 depression  Continue Cymbalta on discharge  Malnutrition  - RD on board while patient in-house - Patient will be discharged on nutritional supplementation   Procedures:  As listed above  Consultations:  As listed above  Discharge Exam: Filed Vitals:   06/06/14 0505  BP: 141/58  Pulse: 65  Temp: 98.1 F  (36.7 C)  Resp: 18    General: Pt in nad, alert and awake Cardiovascular: rrr, no mrg Respiratory: cta bl, no wheezes  Discharge Instructions You were cared for by a hospitalist during your hospital stay. If you have any questions about your discharge medications or the care you received while you were in the hospital after you are discharged, you can call the unit and asked to speak with the hospitalist on call if the hospitalist that took care of you is not available. Once you are discharged, your primary care physician will handle any further medical issues. Please note that NO REFILLS for any discharge medications will be authorized once you are discharged, as it is imperative that you return to your primary care physician (or establish a relationship with a primary care physician if you do not have one) for your aftercare needs so that they can reassess your need for medications and monitor your lab values.  Discharge Instructions   Call MD for:  difficulty breathing, headache or visual disturbances    Complete by:  As directed      Call MD for:  extreme fatigue    Complete by:  As directed      Call MD for:  severe uncontrolled pain    Complete by:  As directed      Call MD for:  temperature >100.4    Complete by:  As directed      Diet - low sodium heart healthy    Complete by:  As directed      Increase activity slowly    Complete by:  As directed  Current Discharge Medication List    START taking these medications   Details  docusate sodium 100 MG CAPS Take 100 mg by mouth 2 (two) times daily. Qty: 10 capsule, Refills: 0    doxycycline (VIBRA-TABS) 100 MG tablet Take 1 tablet (100 mg total) by mouth every 12 (twelve) hours. Qty: 8 tablet, Refills: 0      CONTINUE these medications which have CHANGED   Details  insulin glargine (LANTUS) 100 UNIT/ML injection Inject 0.1 mLs (10 Units total) into the skin at bedtime. Qty: 10 mL, Refills: 11    morphine (MS  CONTIN) 15 MG 12 hr tablet Take 1 tablet (15 mg total) by mouth every 12 (twelve) hours. Qty: 30 tablet, Refills: 0    oxyCODONE-acetaminophen (PERCOCET/ROXICET) 5-325 MG per tablet Take 1 tablet by mouth every 4 (four) hours as needed (breakthrough severe pain after administration of MS contin). Qty: 30 tablet, Refills: 0      CONTINUE these medications which have NOT CHANGED   Details  acetaminophen (TYLENOL) 325 MG tablet Take 2 tablets (650 mg total) by mouth every 6 (six) hours as needed for mild pain or fever. Qty: 30 tablet, Refills: 0    aspirin EC 81 MG tablet Take 81 mg by mouth every morning.     calcium citrate-vitamin D (CITRACAL+D) 315-200 MG-UNIT per tablet Take 2 tablets by mouth every morning.     cholecalciferol (VITAMIN D) 1000 UNITS tablet Take 1,000 Units by mouth every morning.     conjugated estrogens (PREMARIN) vaginal cream Place 1 Applicatorful vaginally 2 (two) times a week. Uses on Tues and Fri    DULoxetine (CYMBALTA) 30 MG capsule Take 30 mg by mouth every morning.     fexofenadine (ALLEGRA) 180 MG tablet Take 180 mg by mouth every morning.     GLUCERNA (GLUCERNA) LIQD Take 237 mLs by mouth 3 (three) times daily. Vanilla only    iron polysaccharides (NIFEREX) 150 MG capsule Take 150 mg by mouth 2 (two) times daily.    LORazepam (ATIVAN) 0.5 MG tablet Take 1 tablet (0.5 mg total) by mouth every 12 (twelve) hours. For anxiety. Qty: 10 tablet, Refills: 0    methylcellulose (ARTIFICIAL TEARS) 1 % ophthalmic solution Place 1 drop into both eyes daily as needed (for dry eyes).    olopatadine (PATANOL) 0.1 % ophthalmic solution Place 1 drop into both eyes 2 (two) times daily as needed (itching/watery eyes). Qty: 5 mL, Refills: 12    omeprazole (PRILOSEC) 20 MG capsule Take 20 mg by mouth every morning.     potassium chloride SA (K-DUR,KLOR-CON) 20 MEQ tablet Take 20 mEq by mouth every morning.     promethazine (PHENERGAN) 12.5 MG tablet Take 12.5 mg by  mouth every 6 (six) hours as needed for nausea or vomiting.    senna-docusate (SENNA-S) 8.6-50 MG per tablet Take 2 tablets by mouth at bedtime.      STOP taking these medications     amiloride-hydrochlorothiazide (MODURETIC) 5-50 MG tablet      insulin aspart (NOVOLOG) 100 UNIT/ML injection      polyethylene glycol (MIRALAX / GLYCOLAX) packet        Allergies  Allergen Reactions  . Ciprofloxacin Other (See Comments)    Leg and feet numbness  . Lidoderm [Lidocaine] Itching, Rash and Other (See Comments)    Burning sensations also  . Ace Inhibitors Other (See Comments)    Poorly tolerated-per PCP  . Angiotensin Receptor Blockers Other (See Comments)    Poorly  tolerated-per PCP  . Glucotrol [Glipizide] Nausea Only and Other (See Comments)    Headaches also  . Triple Antibiotic [Bacitracin-Neomycin-Polymyxin] Other (See Comments)    Poorly tolerated  . Neosporin [Neomycin-Bacitracin Zn-Polymyx] Rash  . Sulfa Antibiotics Rash  . Zyrtec [Cetirizine] Itching and Rash   Follow-up Information   Follow up with DUDA,MARCUS V, MD In 2 weeks.   Specialty:  Orthopedic Surgery   Contact information:   [address] [place] [address] [phone]        The results of significant diagnostics from this hospitalization (including imaging, microbiology, ancillary and laboratory) are listed below for reference.    Significant Diagnostic Studies: Dg Foot Complete Right  06/01/2014   CLINICAL DATA:  Swollen and red foot. Diabetes. Initial evaluation.  EXAM: RIGHT FOOT COMPLETE - 3+ VIEW  COMPARISON:  MRI 03/18/2014, right foot series 8 03/2014  FINDINGS: Diffuse soft tissue swelling is present. Vascular calcification is present. Patient has had a prior first digit amputation. Bony erosion noted along the lateral aspect of the distal first metatarsals noted. This is most likely secondary osteomyelitis. This is a new finding.  IMPRESSION: 1. Diffuse soft tissue swelling  consistent with cellulitis. 2. Lytic area along the lateral aspect of the distal right first metatarsal consistent with a prominent focus of osteomyelitis . These results will be called to the ordering clinician or representative by the Radiologist Assistant, and communication documented in the PACS or zVision Dashboard.   Electronically Signed   By: Maisie Fushomas  Register   On: 06/01/2014 15:34   Dg Toe Great Left  06/01/2014   CLINICAL DATA:  Nonhealing wound of the left big toe. Patient is diabetic  EXAM: LEFT GREAT TOE  COMPARISON:  September 23, 2013  FINDINGS: There is no evidence of fracture or dislocation. There is bony defect in the tip of the first distal phalangeal tuft which appear slightly more irregular compared to the previous exam; this is suspicious for osteomyelitis.  IMPRESSION: There is bony defect in the tip of the first distal phalangeal tuft which appear slightly more irregular compared to the previous exam; this is suspicious for osteomyelitis.   Electronically Signed   By: Sherian ReinWei-Chen  Lin M.D.   On: 06/01/2014 15:32    Microbiology: Recent Results (from the past 240 hour(s))  CULTURE, BLOOD (ROUTINE X 2)     Status: None   Collection Time    06/01/14  4:55 PM      Result Value Ref Range Status   Specimen Description BLOOD LEFT ANTECUBITAL   Final   Special Requests BOTTLES DRAWN AEROBIC AND ANAEROBIC 5ML   Final   Culture  Setup Time     Final   Value: 06/01/2014 21:58     Performed at [facility]   Culture     Final   Value:        BLOOD CULTURE RECEIVED NO GROWTH TO DATE CULTURE WILL BE HELD FOR 5 DAYS BEFORE ISSUING A FINAL NEGATIVE REPORT     Performed at [facility]   Report Status PENDING   Incomplete  CULTURE, BLOOD (ROUTINE X 2)     Status: None   Collection Time    06/01/14  5:22 PM      Result Value Ref Range Status   Specimen Description BLOOD RIGHT ANTECUBITAL   Final   Special Requests BOTTLES DRAWN AEROBIC AND ANAEROBIC 5ML   Final   Culture   Setup Time     Final  Value: 06/01/2014 21:58     Performed at [facility]   Culture     Final   Value:        BLOOD CULTURE RECEIVED NO GROWTH TO DATE CULTURE WILL BE HELD FOR 5 DAYS BEFORE ISSUING A FINAL NEGATIVE REPORT     Performed at [facility]   Report Status PENDING   Incomplete  SURGICAL PCR SCREEN     Status: Abnormal   Collection Time    06/02/14 10:26 PM      Result Value Ref Range Status   MRSA, PCR POSITIVE (*) NEGATIVE Final   Comment: RESULT CALLED TO, READ BACK BY AND VERIFIED WITH:     C.BEVERLY,RN AT 0135 ON 06/03/14 BY SHEA.W   Staphylococcus aureus POSITIVE (*) NEGATIVE Final   Comment:            The Xpert SA Assay (FDA     approved for NASAL specimens     in patients over 78 years of age),     is one component of     a comprehensive surveillance     program.  Test performance has     been validated by [facility] for patients greater     than or equal to 78 year old.     It is not intended     to diagnose infection nor to     guide or monitor treatment.     Labs: Basic Metabolic Panel:  Recent Labs Lab 06/01/14 1440 06/01/14 1442 06/02/14 0444 06/04/14 0614 06/05/14 0638 06/06/14 0607  NA  --  138 138 141 141 140  K  --  4.0 3.8 3.7 4.1 3.7  CL  --  95* 100 101 100 99  CO2  --  34* 30 32 30 33*  GLUCOSE  --  60* 152* 99 75 153*  BUN  --  25* 16 14 15 16   CREATININE  --  0.67 0.56 0.70 0.76 0.56  CALCIUM  --  9.9 8.8 8.7 8.7 8.7  MG 2.0  --   --   --   --   --    Liver Function Tests:  Recent Labs Lab 06/01/14 1533 06/02/14 0444  AST  --  13  ALT  --  8  ALKPHOS  --  112  BILITOT  --  0.3  PROT  --  6.2  ALBUMIN 3.5 2.8*   No results found for this basename: LIPASE, AMYLASE,  in the last 168 hours No results found for this basename: AMMONIA,  in the last 168 hours CBC:  Recent Labs Lab 06/01/14 1442 06/02/14 0444 06/04/14 0614 06/05/14 0638  WBC 7.6 5.0 6.7 6.4  HGB 12.9 11.1* 11.5* 11.4*  HCT  40.2 33.3* 36.1 35.5*  MCV 86.6 84.1 86.2 87.0  PLT 350 264 268 234   Cardiac Enzymes: No results found for this basename: CKTOTAL, CKMB, CKMBINDEX, TROPONINI,  in the last 168 hours BNP: BNP (last 3 results) No results found for this basename: PROBNP,  in the last 8760 hours CBG:  Recent Labs Lab 06/05/14 1133 06/05/14 1702 06/05/14 2124 06/06/14 0622 06/06/14 1227  GLUCAP 214* 188* 252* 147* 334*       Signed:  Penny PiaVEGA, Tonya Taite  [facility] 06/06/2014, 12:42 PM

## 2014-06-07 LAB — CULTURE, BLOOD (ROUTINE X 2)
CULTURE: NO GROWTH
Culture: NO GROWTH

## 2014-07-03 ENCOUNTER — Emergency Department (HOSPITAL_COMMUNITY)
Admission: EM | Admit: 2014-07-03 | Discharge: 2014-07-03 | Disposition: A | Discharge status: Home / Self Care | Payer: Medicare Other | Attending: Emergency Medicine | Admitting: Emergency Medicine

## 2014-07-03 ENCOUNTER — Emergency Department (HOSPITAL_COMMUNITY): Payer: Medicare Other

## 2014-07-03 ENCOUNTER — Encounter (HOSPITAL_COMMUNITY): Payer: Self-pay | Admitting: Emergency Medicine

## 2014-07-03 DIAGNOSIS — Z8742 Personal history of other diseases of the female genital tract: Secondary | ICD-10-CM | POA: Insufficient documentation

## 2014-07-03 DIAGNOSIS — I10 Essential (primary) hypertension: Secondary | ICD-10-CM | POA: Insufficient documentation

## 2014-07-03 DIAGNOSIS — Z792 Long term (current) use of antibiotics: Secondary | ICD-10-CM | POA: Diagnosis not present

## 2014-07-03 DIAGNOSIS — G629 Polyneuropathy, unspecified: Secondary | ICD-10-CM | POA: Diagnosis not present

## 2014-07-03 DIAGNOSIS — Z79899 Other long term (current) drug therapy: Secondary | ICD-10-CM | POA: Diagnosis not present

## 2014-07-03 DIAGNOSIS — Z8701 Personal history of pneumonia (recurrent): Secondary | ICD-10-CM | POA: Diagnosis not present

## 2014-07-03 DIAGNOSIS — M79672 Pain in left foot: Secondary | ICD-10-CM | POA: Diagnosis present

## 2014-07-03 DIAGNOSIS — Z8619 Personal history of other infectious and parasitic diseases: Secondary | ICD-10-CM | POA: Insufficient documentation

## 2014-07-03 DIAGNOSIS — Z794 Long term (current) use of insulin: Secondary | ICD-10-CM | POA: Insufficient documentation

## 2014-07-03 DIAGNOSIS — M79676 Pain in unspecified toe(s): Secondary | ICD-10-CM

## 2014-07-03 DIAGNOSIS — K219 Gastro-esophageal reflux disease without esophagitis: Secondary | ICD-10-CM | POA: Diagnosis not present

## 2014-07-03 DIAGNOSIS — F329 Major depressive disorder, single episode, unspecified: Secondary | ICD-10-CM | POA: Diagnosis not present

## 2014-07-03 DIAGNOSIS — M1990 Unspecified osteoarthritis, unspecified site: Secondary | ICD-10-CM | POA: Insufficient documentation

## 2014-07-03 DIAGNOSIS — M79675 Pain in left toe(s): Secondary | ICD-10-CM | POA: Diagnosis not present

## 2014-07-03 DIAGNOSIS — E119 Type 2 diabetes mellitus without complications: Secondary | ICD-10-CM | POA: Insufficient documentation

## 2014-07-03 DIAGNOSIS — Z7982 Long term (current) use of aspirin: Secondary | ICD-10-CM | POA: Diagnosis not present

## 2014-07-03 LAB — CBC WITH DIFFERENTIAL/PLATELET
Basophils Absolute: 0 10*3/uL (ref 0.0–0.1)
Basophils Relative: 0 % (ref 0–1)
Eosinophils Absolute: 0.1 10*3/uL (ref 0.0–0.7)
Eosinophils Relative: 1 % (ref 0–5)
HCT: 33.7 % — ABNORMAL LOW (ref 36.0–46.0)
Hemoglobin: 10.9 g/dL — ABNORMAL LOW (ref 12.0–15.0)
Lymphocytes Relative: 29 % (ref 12–46)
Lymphs Abs: 1.5 10*3/uL (ref 0.7–4.0)
MCH: 27.6 pg (ref 26.0–34.0)
MCHC: 32.3 g/dL (ref 30.0–36.0)
MCV: 85.3 fL (ref 78.0–100.0)
Monocytes Absolute: 0.3 10*3/uL (ref 0.1–1.0)
Monocytes Relative: 5 % (ref 3–12)
NEUTROS PCT: 65 % (ref 43–77)
Neutro Abs: 3.2 10*3/uL (ref 1.7–7.7)
PLATELETS: 206 10*3/uL (ref 150–400)
RBC: 3.95 MIL/uL (ref 3.87–5.11)
RDW: 13.6 % (ref 11.5–15.5)
WBC: 5 10*3/uL (ref 4.0–10.5)

## 2014-07-03 LAB — I-STAT CG4 LACTIC ACID, ED: Lactic Acid, Venous: 0.86 mmol/L (ref 0.5–2.2)

## 2014-07-03 LAB — BASIC METABOLIC PANEL
Anion gap: 9 (ref 5–15)
BUN: 16 mg/dL (ref 6–23)
CO2: 32 mEq/L (ref 19–32)
Calcium: 9.1 mg/dL (ref 8.4–10.5)
Chloride: 92 mEq/L — ABNORMAL LOW (ref 96–112)
Creatinine, Ser: 0.61 mg/dL (ref 0.50–1.10)
GFR, EST NON AFRICAN AMERICAN: 84 mL/min — AB (ref >90)
Glucose, Bld: 330 mg/dL — ABNORMAL HIGH (ref 70–99)
POTASSIUM: 4 meq/L (ref 3.7–5.3)
SODIUM: 133 meq/L — AB (ref 137–147)

## 2014-07-03 MED ORDER — CEPHALEXIN 500 MG PO CAPS: 500.0000 mg | ORAL_CAPSULE | Freq: Three times a day (TID) | ORAL | Status: DC

## 2014-07-03 MED ORDER — HYDROCODONE-ACETAMINOPHEN 5-325 MG PO TABS: 1.0000 | ORAL_TABLET | Freq: Four times a day (QID) | ORAL | Status: DC | PRN

## 2014-07-03 MED ORDER — FENTANYL CITRATE 0.05 MG/ML IJ SOLN
50.0000 ug | Freq: Once | INTRAMUSCULAR | Status: AC
  Administered 2014-07-03: 50 ug via INTRAVENOUS
  Filled 2014-07-03: qty 2

## 2014-07-03 NOTE — ED Provider Notes (Signed)
CSN: [id number]     Arrival date & time 07/03/14  1113 History   First MD Initiated Contact with Patient 07/03/14 1120     Chief Complaint  Patient presents with  . Foot Pain     (Consider location/radiation/quality/duration/timing/severity/associated sxs/prior Treatment) HPI Comments: Patient with past medical history of diabetes, neuropathy, hypertension, prior amputation of toes presents to the emergency department with chief complaint of left second toe pain. She states that she is followed by Dr. Lajoyce Cornersuda, but reports increased pain, swelling, and erythema with mild discharge. She states that her wound care nurse told her to come to the emergency department. He complains of 7 out of 10 pain.  She denies taking any antibiotics for this currently. The pain is worsened with movement, palpation, and ambulation.  She denies any active fevers, chills, chest pain or shortness of breath.  The history is provided by the patient. No language interpreter was used.    Past Medical History  Diagnosis Date  . Incontinence of urine   . Diarrhea   . Neuropathy     bilateral feet  . GERD (gastroesophageal reflux disease)   . Depression   . Urinary, incontinence, stress female   . History of shingles 2009  . Degenerative lumbar spinal stenosis   . Type II diabetes mellitus     Type 2 IDDM  X 40 years;   . Arthritis     lower back/hands  . Altered mental status 01/13/2013  . Hypertension   . Pneumonia    Past Surgical History  Procedure Laterality Date  . Incontinence surgery  2005  . Tonsillectomy  1964  . Abdominal hysterectomy  1982  . Ovarian cyst surgery  1972  . Cholecystectomy  1988  . Cystoscopy  2009    macroplastique inj  . Rectocele repair  10/15/2011    Procedure: POSTERIOR REPAIR (RECTOCELE);  Surgeon: Kathi LudwigSigmund I Tannenbaum, MD;  [facility];  Service: Urology;;  posterior vaginal vault repair with sacral spinus repair with graft  . Cystoscopy  10/15/2011    Procedure: CYSTOSCOPY;  Surgeon: Kathi LudwigSigmund I Tannenbaum, MD;  [facility];  Service: Urology;  Laterality: N/A;  . Flexible sigmoidoscopy  02/28/2012    Procedure: FLEXIBLE SIGMOIDOSCOPY;  Surgeon: Willis ModenaWilliam Outlaw, MD;  Location: [facility];  Service: Endoscopy;  Laterality: N/A;  . Breast biopsy  1990    bilaterally  . Cataract extraction w/ intraocular lens implant    . Posterior fusion lumbar spine  05/09/2012  . Amputation Right 03/30/2014    Procedure: Right Great Toe Amputation at MTP Joint;  Surgeon: Nadara MustardMarcus Duda V, MD;  [facility];  Service: Orthopedics;  Laterality: Right;  . Amputation N/A 06/03/2014    Procedure: AMPUTATION LEFT GREAT TOE  AND  RIGHT SECOND TOE;  Surgeon: Nadara MustardMarcus Duda V, MD;  Location: [facility] OR;  Service: Orthopedics;  Laterality: N/A;   Family History  Problem Relation Age of Onset  . Osteoarthritis Sister   . Osteoarthritis Brother    History  Substance Use Topics  . Smoking status: Never Smoker   . Smokeless tobacco: Never Used  . Alcohol Use: No   OB History    No data available     Review of Systems  Constitutional: Negative for fever and chills.  Respiratory: Negative for shortness of breath.   Cardiovascular: Negative for chest pain.  Gastrointestinal: Negative for nausea, vomiting, diarrhea and constipation.  Genitourinary: Negative for dysuria.  Skin: Positive for wound.  All other systems reviewed and are negative.     Allergies  Ciprofloxacin; Lidoderm; Ace inhibitors; Angiotensin receptor blockers; Glucotrol; Triple antibiotic; Neosporin; Sulfa antibiotics; and Zyrtec  Home Medications   Prior to Admission medications   Medication Sig Start Date End Date Taking? Authorizing Provider  acetaminophen (TYLENOL) 325 MG tablet Take 2 tablets (650 mg total) by mouth every 6 (six) hours as needed for mild pain or fever. 03/20/14   Richarda OverlieNayana Abrol, MD  aspirin EC 81 MG tablet Take 81 mg by mouth every morning.      Historical Provider, MD  calcium citrate-vitamin D (CITRACAL+D) 315-200 MG-UNIT per tablet Take 2 tablets by mouth every morning.     Historical Provider, MD  cholecalciferol (VITAMIN D) 1000 UNITS tablet Take 1,000 Units by mouth every morning.     Historical Provider, MD  conjugated estrogens (PREMARIN) vaginal cream Place 1 Applicatorful vaginally 2 (two) times a week. Uses on Tues and Fri    Historical Provider, MD  docusate sodium 100 MG CAPS Take 100 mg by mouth 2 (two) times daily. 06/06/14   Penny Piarlando Vega, MD  doxycycline (VIBRA-TABS) 100 MG tablet Take 1 tablet (100 mg total) by mouth every 12 (twelve) hours. 06/06/14   Penny Piarlando Vega, MD  DULoxetine (CYMBALTA) 30 MG capsule Take 30 mg by mouth every morning.     Historical Provider, MD  fexofenadine (ALLEGRA) 180 MG tablet Take 180 mg by mouth every morning.     Historical Provider, MD  GLUCERNA (GLUCERNA) LIQD Take 237 mLs by mouth 3 (three) times daily. Vanilla only    Historical Provider, MD  insulin glargine (LANTUS) 100 UNIT/ML injection Inject 0.1 mLs (10 Units total) into the skin at bedtime. 06/06/14   Penny Piarlando Vega, MD  iron polysaccharides (NIFEREX) 150 MG capsule Take 150 mg by mouth 2 (two) times daily.    Historical Provider, MD  LORazepam (ATIVAN) 0.5 MG tablet Take 1 tablet (0.5 mg total) by mouth every 12 (twelve) hours. For anxiety. 03/20/14   Richarda OverlieNayana Abrol, MD  methylcellulose (ARTIFICIAL TEARS) 1 % ophthalmic solution Place 1 drop into both eyes daily as needed (for dry eyes).    Historical Provider, MD  morphine (MS CONTIN) 15 MG 12 hr tablet Take 1 tablet (15 mg total) by mouth every 12 (twelve) hours. 06/06/14   Penny Piarlando Vega, MD  olopatadine (PATANOL) 0.1 % ophthalmic solution Place 1 drop into both eyes 2 (two) times daily as needed (itching/watery eyes). 03/20/14   Richarda OverlieNayana Abrol, MD  omeprazole (PRILOSEC) 20 MG capsule Take 20 mg by mouth every morning.     Historical Provider, MD  oxyCODONE-acetaminophen (PERCOCET/ROXICET)  5-325 MG per tablet Take 1 tablet by mouth every 4 (four) hours as needed (breakthrough severe pain after administration of MS contin). 06/06/14   Penny Piarlando Vega, MD  potassium chloride SA (K-DUR,KLOR-CON) 20 MEQ tablet Take 20 mEq by mouth every morning.     Historical Provider, MD  promethazine (PHENERGAN) 12.5 MG tablet Take 12.5 mg by mouth every 6 (six) hours as needed for nausea or vomiting.    Historical Provider, MD  senna-docusate (SENNA-S) 8.6-50 MG per tablet Take 2 tablets by mouth at bedtime.    Historical Provider, MD   BP 145/58 mmHg  Pulse 70  Temp(Src) 97.5 F (36.4 C) (Oral)  Resp 18  Ht 5\' 4"  (1.626 m)  Wt 118 lb (53.524 kg)  BMI 20.24 kg/m2  SpO2 99% Physical Exam  Constitutional: She is oriented to person, place, and time. She  appears well-developed and well-nourished.  HENT:  Head: Normocephalic and atraumatic.  Eyes: Conjunctivae and EOM are normal. Pupils are equal, round, and reactive to light.  Neck: Normal range of motion. Neck supple.  Cardiovascular: Normal rate and regular rhythm.  Exam reveals no gallop and no friction rub.   No murmur heard. Distal pulses are dopplerable, but not felt on palpation  Pulmonary/Chest: Effort normal and breath sounds normal. No respiratory distress. She has no wheezes. She has no rales. She exhibits no tenderness.  Abdominal: Soft. Bowel sounds are normal. She exhibits no distension and no mass. There is no tenderness. There is no rebound and no guarding.  Musculoskeletal: Normal range of motion. She exhibits no edema or tenderness.  Left foot and toes as pictured below, range of motion and strength limited secondary to pain  Neurological: She is alert and oriented to person, place, and time.  Sensation somewhat diminished likely secondary to neuropathy in distal lower extremities  Skin: Skin is warm and dry.  Psychiatric: She has a normal mood and affect. Her behavior is normal. Judgment and thought content normal.  Nursing  note and vitals reviewed.   ED Course  Procedures (including critical care time) Labs Review Labs Reviewed  CBC WITH DIFFERENTIAL  BASIC METABOLIC PANEL  I-STAT CG4 LACTIC ACID, ED    Imaging Review No results found.   EKG Interpretation None              MDM   Final diagnoses:  Toe pain    Patient seen by and discussed with Dr. Effie Shy.  Labs are reassuring.  X-ray remarkable for possible non-displaced fracture.  2:01 PM Discussed the patient with Dr. Lajoyce Corners, who attempted to view images, but was unable to.  Recommends starting keflex 500mg  TID.  If symptoms worsen, follow-up in his office tomorrow or Wednesday, otherwise follow-up early next week.    Discussed the plan with Dr. Effie Shy, who agrees with the plan.    Roxy Horseman, PA-C 07/03/14 1555  Flint Melter, MD 07/03/14 [phone]

## 2014-07-03 NOTE — ED Notes (Signed)
Per ems, pt w/hx of bilateral foot infections/toe amputations, here today with same.

## 2014-07-03 NOTE — ED Provider Notes (Signed)
  Face-to-face evaluation   History: She presents for evaluation of worsening swelling, and pain, with redness in her left leg and left foot, and left second toe.  She is receiving home health treatments.  She saw her orthopedist for similar problems last week.  She is not currently taking antibiotics.  Physical exam: Elderly female alert, calm and cooperative.  Bilateral lower extremity swelling, left greater than right, with redness of the left lower leg and foot.  Left second toe is swollen and erythematous with distal ulcer and mild purulent drainage.  No proximal streaking.  Medical screening examination/treatment/procedure(s) were conducted as a shared visit with non-physician practitioner(s) and myself.  I personally evaluated the patient during the encounter  Flint MelterElliott L Haadi Santellan, MD 07/03/14 1719

## 2014-07-03 NOTE — Discharge Instructions (Signed)
You need to follow-up tomorrow if the symptoms worsen.  If the symptoms worsen over the long weekend, please return to the ER.  If the symptoms improve, please see Dr. Lajoyce Cornersuda early next week  Cellulitis Cellulitis is an infection of the skin and the tissue beneath it. The infected area is usually red and tender. Cellulitis occurs most often in the arms and lower legs.  CAUSES  Cellulitis is caused by bacteria that enter the skin through cracks or cuts in the skin. The most common types of bacteria that cause cellulitis are staphylococci and streptococci. SIGNS AND SYMPTOMS   Redness and warmth.  Swelling.  Tenderness or pain.  Fever. DIAGNOSIS  Your health care provider can usually determine what is wrong based on a physical exam. Blood tests may also be done. TREATMENT  Treatment usually involves taking an antibiotic medicine. HOME CARE INSTRUCTIONS   Take your antibiotic medicine as directed by your health care provider. Finish the antibiotic even if you start to feel better.  Keep the infected arm or leg elevated to reduce swelling.  Apply a warm cloth to the affected area up to 4 times per day to relieve pain.  Take medicines only as directed by your health care provider.  Keep all follow-up visits as directed by your health care provider. SEEK MEDICAL CARE IF:   You notice red streaks coming from the infected area.  Your red area gets larger or turns dark in color.  Your bone or joint underneath the infected area becomes painful after the skin has healed.  Your infection returns in the same area or another area.  You notice a swollen bump in the infected area.  You develop new symptoms.  You have a fever. SEEK IMMEDIATE MEDICAL CARE IF:   You feel very sleepy.  You develop vomiting or diarrhea.  You have a general ill feeling (malaise) with muscle aches and pains. MAKE SURE YOU:   Understand these instructions.  Will watch your condition.  Will get help  right away if you are not doing well or get worse. Document Released: 05/06/2005 Document Revised: 12/11/2013 Document Reviewed: 10/12/2011 [facility] Patient Information 2015 [place], [place]. This information is not intended to replace advice given to you by your health care provider. Make sure you discuss any questions you have with your health care provider. Fingernail or Toenail Loss All or part of your fingernail or toenail has been lost. This may or may not grow back as a normal nail. A special non-stick bandage has been put on your finger or toe tightly to prevent bleeding. HOME CARE INSTRUCTIONS  The tips of fingers and toes are full of nerves and injuries are often very painful. The following will help you decrease the pain and obtain the best outcome.  Keep your hand or foot elevated above your heart to relieve pain and swelling. This will require lying in bed or on a couch with the hand or leg on pillows or sitting in a recliner with the leg up. Letting your hand or leg dangle may increase swelling, slow healing and cause throbbing pain.  Keep your dressing dry and clean.  Change your bandage in 24 hours after going home.  After your bandage is changed, soak your hand or foot in warm soapy water for 10 to 20 minutes. Do this 3 times per day. This helps reduce pain and swelling. After soaking, apply a clean, dry bandage. Change your bandage if it is wet or dirty.  Only take  over-the-counter or prescription medicines for pain, discomfort, or fever as directed by your caregiver.  See your caregiver as needed for problems. SEEK IMMEDIATE MEDICAL CARE IF:   You have increased pain, swelling, drainage, or bleeding.  You have a fever. MAKE SURE YOU:   Understand these instructions.  Will watch your condition.  Will get help right away if you are not doing well or get worse. Document Released: 06/18/2006 Document Revised: 10/19/2011 Document Reviewed: 09/07/2006 [facility] Patient  Information 2015 [place], [place]. This information is not intended to replace advice given to you by your health care provider. Make sure you discuss any questions you have with your health care provider.

## 2014-07-11 ENCOUNTER — Other Ambulatory Visit (HOSPITAL_COMMUNITY): Payer: Self-pay | Admitting: Orthopedic Surgery

## 2014-07-12 ENCOUNTER — Encounter (HOSPITAL_COMMUNITY): Payer: Self-pay | Admitting: *Deleted

## 2014-07-12 MED ORDER — CEFAZOLIN SODIUM-DEXTROSE 2-3 GM-% IV SOLR
2.0000 g | INTRAVENOUS | Status: AC
  Administered 2014-07-13: 2 g via INTRAVENOUS
  Filled 2014-07-12: qty 50

## 2014-07-12 NOTE — Progress Notes (Signed)
I spoke with Patient's husband, he report that [facility] has [facility] about [facility].  i gave instruction to Shallow[place]hatare, patients care giver.

## 2014-07-13 ENCOUNTER — Ambulatory Visit (HOSPITAL_COMMUNITY): Payer: PRIVATE HEALTH INSURANCE | Admitting: Certified Registered Nurse Anesthetist

## 2014-07-13 ENCOUNTER — Observation Stay (HOSPITAL_COMMUNITY)
Admission: RE | Admit: 2014-07-13 | Discharge: 2014-07-15 | Disposition: A | Discharge status: Skilled Nursing Facility | Payer: PRIVATE HEALTH INSURANCE | Source: Ambulatory Visit | Attending: Orthopedic Surgery | Admitting: Orthopedic Surgery

## 2014-07-13 ENCOUNTER — Encounter (HOSPITAL_COMMUNITY)
Admission: RE | Disposition: A | Discharge status: Skilled Nursing Facility | Payer: Self-pay | Source: Ambulatory Visit | Attending: Orthopedic Surgery

## 2014-07-13 ENCOUNTER — Encounter (HOSPITAL_COMMUNITY): Payer: Self-pay | Admitting: *Deleted

## 2014-07-13 DIAGNOSIS — I10 Essential (primary) hypertension: Secondary | ICD-10-CM | POA: Insufficient documentation

## 2014-07-13 DIAGNOSIS — Z8701 Personal history of pneumonia (recurrent): Secondary | ICD-10-CM | POA: Insufficient documentation

## 2014-07-13 DIAGNOSIS — K219 Gastro-esophageal reflux disease without esophagitis: Secondary | ICD-10-CM | POA: Diagnosis not present

## 2014-07-13 DIAGNOSIS — M4806 Spinal stenosis, lumbar region: Secondary | ICD-10-CM | POA: Diagnosis not present

## 2014-07-13 DIAGNOSIS — Z794 Long term (current) use of insulin: Secondary | ICD-10-CM | POA: Diagnosis not present

## 2014-07-13 DIAGNOSIS — F329 Major depressive disorder, single episode, unspecified: Secondary | ICD-10-CM | POA: Diagnosis not present

## 2014-07-13 DIAGNOSIS — L97529 Non-pressure chronic ulcer of other part of left foot with unspecified severity: Secondary | ICD-10-CM | POA: Diagnosis not present

## 2014-07-13 DIAGNOSIS — E11621 Type 2 diabetes mellitus with foot ulcer: Secondary | ICD-10-CM | POA: Diagnosis not present

## 2014-07-13 DIAGNOSIS — M869 Osteomyelitis, unspecified: Secondary | ICD-10-CM | POA: Diagnosis present

## 2014-07-13 DIAGNOSIS — Z7982 Long term (current) use of aspirin: Secondary | ICD-10-CM | POA: Diagnosis not present

## 2014-07-13 HISTORY — PX: AMPUTATION: SHX166

## 2014-07-13 HISTORY — DX: Angina pectoris, unspecified: I20.9

## 2014-07-13 LAB — COMPREHENSIVE METABOLIC PANEL
ALT: 11 U/L (ref 0–35)
ANION GAP: 11 (ref 5–15)
AST: 18 U/L (ref 0–37)
Albumin: 3.8 g/dL (ref 3.5–5.2)
Alkaline Phosphatase: 108 U/L (ref 39–117)
BILIRUBIN TOTAL: 0.4 mg/dL (ref 0.3–1.2)
BUN: 14 mg/dL (ref 6–23)
CO2: 33 meq/L — AB (ref 19–32)
CREATININE: 0.56 mg/dL (ref 0.50–1.10)
Calcium: 9.8 mg/dL (ref 8.4–10.5)
Chloride: 94 mEq/L — ABNORMAL LOW (ref 96–112)
GFR calc Af Amer: >90 mL/min (ref >90)
GFR, EST NON AFRICAN AMERICAN: 86 mL/min — AB (ref >90)
Glucose, Bld: 167 mg/dL — ABNORMAL HIGH (ref 70–99)
POTASSIUM: 3.8 meq/L (ref 3.7–5.3)
Sodium: 138 mEq/L (ref 137–147)
Total Protein: 7.7 g/dL (ref 6.0–8.3)

## 2014-07-13 LAB — CBC
HCT: 38.6 % (ref 36.0–46.0)
Hemoglobin: 12.7 g/dL (ref 12.0–15.0)
MCH: 27.4 pg (ref 26.0–34.0)
MCHC: 32.9 g/dL (ref 30.0–36.0)
MCV: 83.2 fL (ref 78.0–100.0)
Platelets: 262 10*3/uL (ref 150–400)
RBC: 4.64 MIL/uL (ref 3.87–5.11)
RDW: 13.7 % (ref 11.5–15.5)
WBC: 5.8 10*3/uL (ref 4.0–10.5)

## 2014-07-13 LAB — GLUCOSE, CAPILLARY
GLUCOSE-CAPILLARY: 196 mg/dL — AB (ref 70–99)
Glucose-Capillary: 150 mg/dL — ABNORMAL HIGH (ref 70–99)
Glucose-Capillary: 162 mg/dL — ABNORMAL HIGH (ref 70–99)
Glucose-Capillary: 163 mg/dL — ABNORMAL HIGH (ref 70–99)
Glucose-Capillary: 154 mg/dL — ABNORMAL HIGH (ref 70–99)

## 2014-07-13 LAB — SURGICAL PCR SCREEN
MRSA, PCR: NEGATIVE
Staphylococcus aureus: NEGATIVE

## 2014-07-13 LAB — APTT: aPTT: 35 seconds (ref 24–37)

## 2014-07-13 LAB — PROTIME-INR
INR: 1.03 (ref 0.00–1.49)
Prothrombin Time: 13.6 seconds (ref 11.6–15.2)

## 2014-07-13 SURGERY — AMPUTATION DIGIT
Anesthesia: Monitor Anesthesia Care | Site: Toe | Laterality: Left

## 2014-07-13 MED ORDER — MUPIROCIN 2 % EX OINT
TOPICAL_OINTMENT | CUTANEOUS | Status: AC
  Filled 2014-07-13: qty 22

## 2014-07-13 MED ORDER — PROMETHAZINE HCL 12.5 MG PO TABS: 12.5000 mg | ORAL_TABLET | Freq: Four times a day (QID) | ORAL | Status: DC | PRN

## 2014-07-13 MED ORDER — PROPOFOL 10 MG/ML IV BOLUS
INTRAVENOUS | Status: DC | PRN
  Administered 2014-07-13: 20 mg via INTRAVENOUS

## 2014-07-13 MED ORDER — ONDANSETRON HCL 4 MG PO TABS: 4.0000 mg | ORAL_TABLET | Freq: Four times a day (QID) | ORAL | Status: DC | PRN

## 2014-07-13 MED ORDER — MORPHINE SULFATE ER 15 MG PO TBCR
EXTENDED_RELEASE_TABLET | ORAL | Status: AC
  Filled 2014-07-13: qty 1

## 2014-07-13 MED ORDER — BUPIVACAINE-EPINEPHRINE (PF) 0.5% -1:200000 IJ SOLN
INTRAMUSCULAR | Status: DC | PRN
  Administered 2014-07-13: 30 mL via PERINEURAL

## 2014-07-13 MED ORDER — POTASSIUM CHLORIDE CRYS ER 20 MEQ PO TBCR
20.0000 meq | EXTENDED_RELEASE_TABLET | Freq: Every morning | ORAL | Status: DC
  Administered 2014-07-14 – 2014-07-15 (×2): 20 meq via ORAL
  Filled 2014-07-13 (×3): qty 1

## 2014-07-13 MED ORDER — ONDANSETRON HCL 4 MG/2ML IJ SOLN: 4.0000 mg | Freq: Four times a day (QID) | INTRAMUSCULAR | Status: DC | PRN

## 2014-07-13 MED ORDER — METOCLOPRAMIDE HCL 10 MG PO TABS: 5.0000 mg | ORAL_TABLET | Freq: Three times a day (TID) | ORAL | Status: DC | PRN

## 2014-07-13 MED ORDER — PANTOPRAZOLE SODIUM 40 MG PO TBEC
DELAYED_RELEASE_TABLET | ORAL | Status: AC
  Filled 2014-07-13: qty 1

## 2014-07-13 MED ORDER — MUPIROCIN 2 % EX OINT
1.0000 "application " | TOPICAL_OINTMENT | Freq: Once | CUTANEOUS | Status: AC
  Administered 2014-07-13: 1 via TOPICAL

## 2014-07-13 MED ORDER — MIDAZOLAM HCL 2 MG/2ML IJ SOLN
INTRAMUSCULAR | Status: AC
  Filled 2014-07-13: qty 2

## 2014-07-13 MED ORDER — FENTANYL CITRATE 0.05 MG/ML IJ SOLN
INTRAMUSCULAR | Status: AC
  Filled 2014-07-13: qty 2

## 2014-07-13 MED ORDER — LACTATED RINGERS IV SOLN
INTRAVENOUS | Status: DC
  Administered 2014-07-13: 14:00:00 via INTRAVENOUS

## 2014-07-13 MED ORDER — METHOCARBAMOL 500 MG PO TABS
500.0000 mg | ORAL_TABLET | Freq: Four times a day (QID) | ORAL | Status: DC | PRN
  Administered 2014-07-14 – 2014-07-15 (×2): 500 mg via ORAL
  Filled 2014-07-13 (×2): qty 1

## 2014-07-13 MED ORDER — GLUCERNA 1.2 CAL PO LIQD
237.0000 mL | Freq: Three times a day (TID) | ORAL | Status: DC
  Administered 2014-07-13 – 2014-07-14 (×4): 237 mL via ORAL
  Filled 2014-07-13 (×9): qty 237

## 2014-07-13 MED ORDER — TRIAMTERENE-HCTZ 75-50 MG PO TABS
1.0000 | ORAL_TABLET | Freq: Every day | ORAL | Status: DC
Start: 2014-07-13 — End: 2014-07-15
  Administered 2014-07-13 – 2014-07-14 (×2): 1 via ORAL
  Filled 2014-07-13 (×3): qty 1

## 2014-07-13 MED ORDER — LACTATED RINGERS IV SOLN
INTRAVENOUS | Status: DC | PRN
  Administered 2014-07-13: 14:00:00 via INTRAVENOUS

## 2014-07-13 MED ORDER — LORAZEPAM 0.5 MG PO TABS
0.5000 mg | ORAL_TABLET | Freq: Two times a day (BID) | ORAL | Status: DC
  Administered 2014-07-13 – 2014-07-15 (×4): 0.5 mg via ORAL
  Filled 2014-07-13 (×2): qty 1

## 2014-07-13 MED ORDER — DOCUSATE SODIUM 100 MG PO CAPS
100.0000 mg | ORAL_CAPSULE | Freq: Two times a day (BID) | ORAL | Status: DC
  Administered 2014-07-13 – 2014-07-14 (×3): 100 mg via ORAL
  Filled 2014-07-13 (×4): qty 1

## 2014-07-13 MED ORDER — SENNOSIDES-DOCUSATE SODIUM 8.6-50 MG PO TABS
2.0000 | ORAL_TABLET | Freq: Every day | ORAL | Status: DC
  Administered 2014-07-13 – 2014-07-14 (×2): 2 via ORAL
  Filled 2014-07-13: qty 2

## 2014-07-13 MED ORDER — METOCLOPRAMIDE HCL 5 MG/ML IJ SOLN: 5.0000 mg | Freq: Three times a day (TID) | INTRAMUSCULAR | Status: DC | PRN

## 2014-07-13 MED ORDER — OXYCODONE-ACETAMINOPHEN 5-325 MG PO TABS
1.0000 | ORAL_TABLET | ORAL | Status: DC | PRN
  Administered 2014-07-15: 1 via ORAL
  Filled 2014-07-13: qty 1

## 2014-07-13 MED ORDER — OXYCODONE HCL 5 MG PO TABS: 5.0000 mg | ORAL_TABLET | Freq: Once | ORAL | Status: DC | PRN

## 2014-07-13 MED ORDER — DOCUSATE SODIUM 100 MG PO CAPS
ORAL_CAPSULE | ORAL | Status: AC
Start: 2014-07-13 — End: 2014-07-14
  Filled 2014-07-13: qty 1

## 2014-07-13 MED ORDER — CEFAZOLIN SODIUM 1-5 GM-% IV SOLN
1.0000 g | Freq: Four times a day (QID) | INTRAVENOUS | Status: AC
  Administered 2014-07-13 – 2014-07-14 (×3): 1 g via INTRAVENOUS
  Filled 2014-07-13 (×3): qty 50

## 2014-07-13 MED ORDER — PANTOPRAZOLE SODIUM 40 MG PO TBEC
40.0000 mg | DELAYED_RELEASE_TABLET | Freq: Every day | ORAL | Status: DC
  Administered 2014-07-13 – 2014-07-15 (×3): 40 mg via ORAL
  Filled 2014-07-13: qty 1

## 2014-07-13 MED ORDER — 0.9 % SODIUM CHLORIDE (POUR BTL) OPTIME
TOPICAL | Status: DC | PRN
  Administered 2014-07-13: 1000 mL

## 2014-07-13 MED ORDER — DULOXETINE HCL 30 MG PO CPEP
30.0000 mg | ORAL_CAPSULE | Freq: Every morning | ORAL | Status: DC
  Administered 2014-07-14 – 2014-07-15 (×2): 30 mg via ORAL
  Filled 2014-07-13 (×2): qty 1

## 2014-07-13 MED ORDER — HYDROMORPHONE HCL 1 MG/ML IJ SOLN: 0.5000 mg | INTRAMUSCULAR | Status: DC | PRN

## 2014-07-13 MED ORDER — METHOCARBAMOL 1000 MG/10ML IJ SOLN: 500.0000 mg | Freq: Four times a day (QID) | INTRAVENOUS | Status: DC | PRN

## 2014-07-13 MED ORDER — MORPHINE SULFATE ER 15 MG PO TBCR
15.0000 mg | EXTENDED_RELEASE_TABLET | Freq: Two times a day (BID) | ORAL | Status: DC
  Administered 2014-07-13 – 2014-07-15 (×4): 15 mg via ORAL
  Filled 2014-07-13 (×2): qty 1

## 2014-07-13 MED ORDER — FENTANYL CITRATE 0.05 MG/ML IJ SOLN: 50.0000 ug | INTRAMUSCULAR | Status: DC | PRN

## 2014-07-13 MED ORDER — FENTANYL CITRATE 0.05 MG/ML IJ SOLN
50.0000 ug | INTRAMUSCULAR | Status: DC | PRN
Start: 2014-07-13 — End: 2014-07-13
  Administered 2014-07-13: 50 ug via INTRAVENOUS

## 2014-07-13 MED ORDER — INSULIN ASPART 100 UNIT/ML ~~LOC~~ SOLN
4.0000 [IU] | Freq: Three times a day (TID) | SUBCUTANEOUS | Status: DC
  Administered 2014-07-14 (×3): 4 [IU] via SUBCUTANEOUS

## 2014-07-13 MED ORDER — OLOPATADINE HCL 0.1 % OP SOLN
1.0000 [drp] | Freq: Two times a day (BID) | OPHTHALMIC | Status: DC | PRN
  Administered 2014-07-14: 1 [drp] via OPHTHALMIC
  Filled 2014-07-13: qty 5

## 2014-07-13 MED ORDER — OXYCODONE HCL 5 MG/5ML PO SOLN: 5.0000 mg | Freq: Once | ORAL | Status: DC | PRN

## 2014-07-13 MED ORDER — SODIUM CHLORIDE 0.9 % IV SOLN: INTRAVENOUS | Status: DC

## 2014-07-13 MED ORDER — MIDAZOLAM HCL 5 MG/5ML IJ SOLN
INTRAMUSCULAR | Status: DC | PRN
  Administered 2014-07-13 (×2): 1 mg via INTRAVENOUS

## 2014-07-13 MED ORDER — LORAZEPAM 0.5 MG PO TABS
ORAL_TABLET | ORAL | Status: AC
  Filled 2014-07-13: qty 1

## 2014-07-13 MED ORDER — ASPIRIN EC 81 MG PO TBEC
81.0000 mg | DELAYED_RELEASE_TABLET | Freq: Every morning | ORAL | Status: DC
  Administered 2014-07-14 – 2014-07-15 (×2): 81 mg via ORAL
  Filled 2014-07-13 (×2): qty 1

## 2014-07-13 MED ORDER — INSULIN GLARGINE 100 UNIT/ML ~~LOC~~ SOLN
10.0000 [IU] | Freq: Every day | SUBCUTANEOUS | Status: DC
  Administered 2014-07-13 – 2014-07-14 (×2): 10 [IU] via SUBCUTANEOUS
  Filled 2014-07-13 (×3): qty 0.1

## 2014-07-13 SURGICAL SUPPLY — 30 items
BNDG CMPR 9X4 STRL LF SNTH (GAUZE/BANDAGES/DRESSINGS)
BNDG COHESIVE 4X5 TAN STRL (GAUZE/BANDAGES/DRESSINGS) ×3 IMPLANT
BNDG ESMARK 4X9 LF (GAUZE/BANDAGES/DRESSINGS) IMPLANT
BNDG GAUZE ELAST 4 BULKY (GAUZE/BANDAGES/DRESSINGS) ×3 IMPLANT
COVER SURGICAL LIGHT HANDLE (MISCELLANEOUS) ×3 IMPLANT
DRAPE U-SHAPE 47X51 STRL (DRAPES) ×3 IMPLANT
DRSG ADAPTIC 3X8 NADH LF (GAUZE/BANDAGES/DRESSINGS) ×2 IMPLANT
DRSG PAD ABDOMINAL 8X10 ST (GAUZE/BANDAGES/DRESSINGS) ×3 IMPLANT
DURAPREP 26ML APPLICATOR (WOUND CARE) ×3 IMPLANT
ELECT REM PT RETURN 9FT ADLT (ELECTROSURGICAL) ×3
ELECTRODE REM PT RTRN 9FT ADLT (ELECTROSURGICAL) ×1 IMPLANT
GAUZE SPONGE 4X4 12PLY STRL (GAUZE/BANDAGES/DRESSINGS) IMPLANT
GLOVE BIOGEL PI IND STRL 9 (GLOVE) ×1 IMPLANT
GLOVE BIOGEL PI INDICATOR 9 (GLOVE) ×2
GLOVE SURG ORTHO 9.0 STRL STRW (GLOVE) ×3 IMPLANT
GOWN STRL REUS W/ TWL XL LVL3 (GOWN DISPOSABLE) ×2 IMPLANT
GOWN STRL REUS W/TWL XL LVL3 (GOWN DISPOSABLE) ×6
KIT BASIN OR (CUSTOM PROCEDURE TRAY) ×3 IMPLANT
KIT ROOM TURNOVER OR (KITS) ×3 IMPLANT
MANIFOLD NEPTUNE II (INSTRUMENTS) ×3 IMPLANT
NEEDLE 22X1 1/2 (OR ONLY) (NEEDLE) IMPLANT
NS IRRIG 1000ML POUR BTL (IV SOLUTION) ×3 IMPLANT
PACK ORTHO EXTREMITY (CUSTOM PROCEDURE TRAY) ×3 IMPLANT
PAD ARMBOARD 7.5X6 YLW CONV (MISCELLANEOUS) ×6 IMPLANT
SPONGE GAUZE 4X4 12PLY STER LF (GAUZE/BANDAGES/DRESSINGS) ×2 IMPLANT
SUCTION FRAZIER TIP 10 FR DISP (SUCTIONS) IMPLANT
SUT ETHILON 2 0 PSLX (SUTURE) ×3 IMPLANT
SYR CONTROL 10ML LL (SYRINGE) IMPLANT
TOWEL OR 17X24 6PK STRL BLUE (TOWEL DISPOSABLE) ×3 IMPLANT
TOWEL OR 17X26 10 PK STRL BLUE (TOWEL DISPOSABLE) ×3 IMPLANT

## 2014-07-13 NOTE — Transfer of Care (Signed)
Immediate Anesthesia Transfer of Care Note  Patient: [name]  Procedure(s) Performed: Procedure(s): Amputation 2nd Toe MTP (Left)  Patient Location: PACU  Anesthesia Type:MAC and Regional  Level of Consciousness: awake, alert , oriented and patient cooperative  Airway & Oxygen Therapy: Patient Spontanous Breathing and Patient connected to nasal cannula oxygen  Post-op Assessment: Report given to PACU RN, Post -op Vital signs reviewed and stable and Patient moving all extremities X 4  Post vital signs: Reviewed and stable  Complications: No apparent anesthesia complications

## 2014-07-13 NOTE — Anesthesia Procedure Notes (Addendum)
Anesthesia Regional Block:  Popliteal block  Pre-Anesthetic Checklist: ,, timeout performed, Correct Patient, Correct Site, Correct Laterality, Correct Procedure, Correct Position, site marked, Risks and benefits discussed,  Surgical consent,  Pre-op evaluation,  At surgeon's request and post-op pain management  Laterality: Left  Prep: chloraprep       Needles:  Injection technique: Single-shot  Needle Type: Echogenic Stimulator Needle     Needle Length: 9cm 9 cm Needle Gauge: 21 and 21 G    Additional Needles:  Procedures: ultrasound guided (picture in chart) and nerve stimulator Popliteal block  Nerve Stimulator or Paresthesia:  Response: plantar flexion of foot, 0.45 mA,   Additional Responses:   Narrative:  Start time: 07/13/2014 3:11 PM End time: 07/13/2014 3:22 PM Injection made incrementally with aspirations every 5 mL.  Performed by: Personally   Additional Notes: Functioning IV was confirmed and monitors were applied.  A 90mm 21ga Arrow echogenic stimulator needle was used. Sterile prep and drape,hand hygiene and sterile gloves were used.  Negative aspiration and negative test dose prior to incremental administration of local anesthetic. The patient tolerated the procedure well.  Ultrasound guidance: relevent anatomy identified, needle position confirmed, local anesthetic spread visualized around nerve(s), vascular puncture avoided.  Image printed for medical record.    Date/Time: 07/13/2014 4:06 PM Performed by: Rogelia BogaMUELLER, Jordyn Hofacker P Pre-anesthesia Checklist: Patient identified, Emergency Drugs available, Suction available, Patient being monitored and Timeout performed Patient Re-evaluated:Patient Re-evaluated prior to inductionOxygen Delivery Method: Nasal cannula

## 2014-07-13 NOTE — Op Note (Signed)
07/13/2014  4:28 PM  PATIENT:  Tonya BodeBertha L [name]    PRE-OPERATIVE DIAGNOSIS:  Osteomyelitis Left Foot 2nd Toe  POST-OPERATIVE DIAGNOSIS:  Same  PROCEDURE:  Amputation 2nd Toe MTP  SURGEON:  DUDA,MARCUS V, MD  PHYSICIAN ASSISTANT:None ANESTHESIA:   General  PREOPERATIVE INDICATIONS:  [name] is a  80 y.o. female with a diagnosis of Osteomyelitis Left Foot 2nd Toe who failed conservative measures and elected for surgical management.    The risks benefits and alternatives were discussed with the patient preoperatively including but not limited to the risks of infection, bleeding, nerve injury, cardiopulmonary complications, the need for revision surgery, among others, and the patient was willing to proceed.  OPERATIVE IMPLANTS: None  OPERATIVE FINDINGS: Good petechial bleeding  OPERATIVE PROCEDURE: Patient was brought to the operating room and underwent a general anesthetic. After adequate levels and anesthesia were obtained patient's left lower extremity was prepped using DuraPrep draped into a sterile field. A timeout was called. A fishmouth incision was made around the second toe at the MTP joint. The toe was amputated through the MTP joint. Electrocautery was used for hemostasis. The wound was closed using 2-0 nylon. The wound was covered with sterile compressive dressing. Patient was extubated taken to the PACU in stable condition. Plan for overnight observation.

## 2014-07-13 NOTE — Progress Notes (Signed)
Bp 206/86 in right arm and rechecked in left arm 199/68. Dr Chaney MallingHodierne called and informed.

## 2014-07-13 NOTE — H&P (Deleted)
[name] is an 80 y.o. female.   Chief Complaint: Rotator cuff tear with impingement left shoulder HPI: Patient is a 80 year old woman with progressive pain with activities of daily living of the left shoulder she has failed conservative care and presents at this time for arthroscopic debridement.  Past Medical History  Diagnosis Date  . Incontinence of urine   . Diarrhea   . Neuropathy     bilateral feet  . GERD (gastroesophageal reflux disease)   . Depression   . Urinary, incontinence, stress female   . History of shingles 2009  . Degenerative lumbar spinal stenosis   . Type II diabetes mellitus     Type 2 IDDM  X 40 years;   . Arthritis     lower back/hands  . Altered mental status 01/13/2013  . Hypertension   . Pneumonia   . Anginal pain     Past Surgical History  Procedure Laterality Date  . Incontinence surgery  2005  . Tonsillectomy  1964  . Abdominal hysterectomy  1982  . Ovarian cyst surgery  1972  . Cholecystectomy  1988  . Cystoscopy  2009    macroplastique inj  . Rectocele repair  10/15/2011    Procedure: POSTERIOR REPAIR (RECTOCELE);  Surgeon: Kathi LudwigSigmund I Tannenbaum, MD;  [facility] [place];  Service: Urology;;  posterior vaginal vault repair with sacral spinus repair with graft  . Cystoscopy  10/15/2011    Procedure: CYSTOSCOPY;  Surgeon: Kathi LudwigSigmund I Tannenbaum, MD;  [facility] [place];  Service: Urology;  Laterality: N/A;  . Flexible sigmoidoscopy  02/28/2012    Procedure: FLEXIBLE SIGMOIDOSCOPY;  Surgeon: Willis ModenaWilliam Outlaw, MD;  Location: [facility];  Service: Endoscopy;  Laterality: N/A;  . Breast biopsy  1990    bilaterally  . Cataract extraction w/ intraocular lens implant    . Posterior fusion lumbar spine  05/09/2012  . Amputation Right 03/30/2014    Procedure: Right Great Toe Amputation at MTP Joint;  Surgeon: Nadara MustardMarcus Duda V, MD;  [facility];  Service: Orthopedics;  Laterality: Right;  . Amputation N/A 06/03/2014   Procedure: AMPUTATION LEFT GREAT TOE  AND  RIGHT SECOND TOE;  Surgeon: Nadara MustardMarcus Duda V, MD;  Location: [facility];  Service: Orthopedics;  Laterality: N/A;    Family History  Problem Relation Age of Onset  . Osteoarthritis Sister   . Osteoarthritis Brother    Social History:  reports that she has never smoked. She has never used smokeless tobacco. She reports that she does not drink alcohol or use illicit drugs.  Allergies:  Allergies  Allergen Reactions  . Ciprofloxacin Other (See Comments)    Leg and feet numbness  . Lidoderm [Lidocaine] Itching, Rash and Other (See Comments)    Burning sensations also  . Ace Inhibitors Other (See Comments)    Poorly tolerated-per PCP  . Angiotensin Receptor Blockers Other (See Comments)    Poorly tolerated-per PCP  . Glucotrol [Glipizide] Nausea Only and Other (See Comments)    Headaches also  . Triple Antibiotic [Bacitracin-Neomycin-Polymyxin] Other (See Comments)    Poorly tolerated  . Neosporin [Neomycin-Bacitracin Zn-Polymyx] Rash  . Sulfa Antibiotics Rash  . Zyrtec [Cetirizine] Itching and Rash    No prescriptions prior to admission    No results found for this or any previous visit (from the past 48 hour(s)). No results found.  Review of Systems  All other systems reviewed and are negative.   There were no vitals taken for this visit. Physical  Exam  On examination patient has pain with Neer and Hawkins impingement test pain with drop arm test active abduction to 70. Assessment/Plan Assessment: Rotator cuff tear with impingement left shoulder.  Plan: We will plan for arthroscopic debridement rotator cuff tear arthroscopic subacromial decompression. Risks and benefits were discussed including persistent pain need for additional surgery. Patient states she understands and wishes to proceed at this time.  DUDA,MARCUS V 07/13/2014, 6:28 AM

## 2014-07-13 NOTE — Anesthesia Preprocedure Evaluation (Signed)
Anesthesia Evaluation  Patient identified by MRN, date of birth, ID band Patient awake    Reviewed: Allergy & Precautions, H&P , NPO status , Patient's Chart, lab work & pertinent test results  Airway Mallampati: II   Neck ROM: full    Dental   Pulmonary          Cardiovascular hypertension, + angina     Neuro/Psych Depression    GI/Hepatic GERD-  ,  Endo/Other  diabetes, Type 2  Renal/GU      Musculoskeletal  (+) Arthritis -,   Abdominal   Peds  Hematology   Anesthesia Other Findings   Reproductive/Obstetrics                             Anesthesia Physical Anesthesia Plan  ASA: III  Anesthesia Plan: MAC and Regional   Post-op Pain Management:    Induction: Intravenous  Airway Management Planned: Simple Face Mask  Additional Equipment:   Intra-op Plan:   Post-operative Plan:   Informed Consent: I have reviewed the patients History and Physical, chart, labs and discussed the procedure including the risks, benefits and alternatives for the proposed anesthesia with the patient or authorized representative who has indicated his/her understanding and acceptance.     Plan Discussed with: CRNA, Anesthesiologist and Surgeon  Anesthesia Plan Comments:         Anesthesia Quick Evaluation

## 2014-07-13 NOTE — H&P (Signed)
Tonya ChildsBertha L [name] is an 80 y.o. female.   Chief Complaint: Osteomyelitis left foot second toe HPI: Patient is a 80 year old woman with type 2 diabetes who has had chronic ulceration to the second toe with ulceration drainage and destructive bony changes.  Past Medical History  Diagnosis Date  . Incontinence of urine   . Diarrhea   . Neuropathy     bilateral feet  . GERD (gastroesophageal reflux disease)   . Depression   . Urinary, incontinence, stress female   . History of shingles 2009  . Degenerative lumbar spinal stenosis   . Type II diabetes mellitus     Type 2 IDDM  X 40 years;   . Arthritis     lower back/hands  . Altered mental status 01/13/2013  . Hypertension   . Pneumonia   . Anginal pain     Past Surgical History  Procedure Laterality Date  . Incontinence surgery  2005  . Tonsillectomy  1964  . Abdominal hysterectomy  1982  . Ovarian cyst surgery  1972  . Cholecystectomy  1988  . Cystoscopy  2009    macroplastique inj  . Rectocele repair  10/15/2011    Procedure: POSTERIOR REPAIR (RECTOCELE);  Surgeon: Kathi LudwigSigmund I Tannenbaum, MD;  [facility] [place];  Service: Urology;;  posterior vaginal vault repair with sacral spinus repair with graft  . Cystoscopy  10/15/2011    Procedure: CYSTOSCOPY;  Surgeon: Kathi LudwigSigmund I Tannenbaum, MD;  [facility] [place];  Service: Urology;  Laterality: N/A;  . Flexible sigmoidoscopy  02/28/2012    Procedure: FLEXIBLE SIGMOIDOSCOPY;  Surgeon: Willis ModenaWilliam Outlaw, MD;  Location: [facility];  Service: Endoscopy;  Laterality: N/A;  . Breast biopsy  1990    bilaterally  . Cataract extraction w/ intraocular lens implant    . Posterior fusion lumbar spine  05/09/2012  . Amputation Right 03/30/2014    Procedure: Right Great Toe Amputation at MTP Joint;  Surgeon: Nadara MustardMarcus Duda V, MD;  [facility];  Service: Orthopedics;  Laterality: Right;  . Amputation N/A 06/03/2014    Procedure: AMPUTATION LEFT GREAT TOE  AND  RIGHT  SECOND TOE;  Surgeon: Nadara MustardMarcus Duda V, MD;  Location: [facility];  Service: Orthopedics;  Laterality: N/A;    Family History  Problem Relation Age of Onset  . Osteoarthritis Sister   . Osteoarthritis Brother    Social History:  reports that she has never smoked. She has never used smokeless tobacco. She reports that she does not drink alcohol or use illicit drugs.  Allergies:  Allergies  Allergen Reactions  . Ciprofloxacin Other (See Comments)    Leg and feet numbness  . Lidoderm [Lidocaine] Itching, Rash and Other (See Comments)    Burning sensations also  . Ace Inhibitors Other (See Comments)    Poorly tolerated-per PCP  . Angiotensin Receptor Blockers Other (See Comments)    Poorly tolerated-per PCP  . Glucotrol [Glipizide] Nausea Only and Other (See Comments)    Headaches also  . Triple Antibiotic [Bacitracin-Neomycin-Polymyxin] Other (See Comments)    Poorly tolerated  . Neosporin [Neomycin-Bacitracin Zn-Polymyx] Rash  . Sulfa Antibiotics Rash  . Zyrtec [Cetirizine] Itching and Rash    No prescriptions prior to admission    No results found for this or any previous visit (from the past 48 hour(s)). No results found.  Review of Systems  All other systems reviewed and are negative.   There were no vitals taken for this visit. Physical Exam  On examination patient  has ulceration with destructive bony changes ulcer probes to bone left foot second toe Assessment/Plan Assessment: Left foot second toe osteomyelitis with ulceration.  Plan: We will plan for left foot second toe amputation. Risks and benefits were discussed including infection neurovascular injury nonhealing of the wound need for additional surgery. Patient states she understands and wishes to proceed at this time.  DUDA,MARCUS V 07/13/2014, 6:36 AM

## 2014-07-14 DIAGNOSIS — M869 Osteomyelitis, unspecified: Secondary | ICD-10-CM | POA: Diagnosis not present

## 2014-07-14 LAB — GLUCOSE, CAPILLARY
GLUCOSE-CAPILLARY: 160 mg/dL — AB (ref 70–99)
GLUCOSE-CAPILLARY: 290 mg/dL — AB (ref 70–99)
GLUCOSE-CAPILLARY: 281 mg/dL — AB (ref 70–99)
Glucose-Capillary: 245 mg/dL — ABNORMAL HIGH (ref 70–99)

## 2014-07-14 MED ORDER — MORPHINE SULFATE ER 15 MG PO TBCR
EXTENDED_RELEASE_TABLET | ORAL | Status: AC
  Filled 2014-07-14: qty 1

## 2014-07-14 MED ORDER — PANTOPRAZOLE SODIUM 40 MG PO TBEC
DELAYED_RELEASE_TABLET | ORAL | Status: AC
  Filled 2014-07-14: qty 1

## 2014-07-14 MED ORDER — LORAZEPAM 0.5 MG PO TABS
ORAL_TABLET | ORAL | Status: AC
  Filled 2014-07-14: qty 1

## 2014-07-14 NOTE — Progress Notes (Signed)
Orthopedic Tech Progress Note Patient Details:  Tonya ChildsBertha L [name] 07/08/1933 [id number] Post op shoe fit for best fit and then placed on shelf for nursing/therapy to apply when patient will be OOB Ortho Devices Type of Ortho Device: Postop shoe/boot Ortho Device/Splint Location: LLE Ortho Device/Splint Interventions: Ordered   Asia R Thompson 07/14/2014, 10:15 AM

## 2014-07-14 NOTE — Progress Notes (Deleted)
Pt discharge packet ready and FL2 signature page on chart for MD in a.m.  CSW will continue to follow to assist with discharge planning [facility].  Fleet ContrasRachel (weekend coverage) [phone]

## 2014-07-14 NOTE — Clinical Social Work Note (Signed)
Clinical Social Work Department BRIEF PSYCHOSOCIAL ASSESSMENT 07/14/2014  Patient:  [name],[name]     Account Number:  [id number]     Admit date:  07/13/2014  Clinical Social Worker:  Doreene NestVAUGHN,[place] Throckmorton, LCSW  Date/Time:  07/14/2014 01:00 PM  Referred by:  Physician  Date Referred:  07/14/2014 Referred for  ALF Placement   Other Referral:   Interview type:  Patient Other interview type:   Chat review, ALF, and spouse    PSYCHOSOCIAL DATA Living Status:  FACILITY Admitted from facility:  [facility] Level of care:  Assisted Living Primary support name:  [name] Primary support relationship to patient:  SPOUSE Degree of support available:   Pt's spouse is in room with pt and very supportive.    CURRENT CONCERNS Current Concerns  Post-Acute Placement   Other Concerns:    SOCIAL WORK ASSESSMENT / PLAN CSW spoke with ALF and pt's spouse.  Plan for pt to return to ALF in a.m. after MD signs new FL2.  Packet is ready for discharge and d/c summary and FL2 have already been sent to ALF.  CSW will continue to follow and set up ambulance transport when pt is ready to discharge.   Assessment/plan status:  Psychosocial Support/Ongoing Assessment of Needs Other assessment/ plan:   Information/referral to community resources:    PATIENT'S/FAMILY'S RESPONSE TO PLAN OF CARE: Pt's spouse and pt wanted to discharge today, however was understanding of information needed from ALF and awaiting MD to return in a.m.  Pt and pt's spouse expressed appreciation of CSW support with discharge.

## 2014-07-14 NOTE — Care Management Note (Signed)
CARE MANAGEMENT NOTE 07/14/2014  Patient:  [name],[name]   Account Number:  [id number]  Date Initiated:  07/14/2014  Documentation initiated by:  Vance PeperBRADY,Evelyne Makepeace  Subjective/Objective Assessment:   80 yr old female admitted with osteomyelitis of left foot second toe.Patient had amputation of left foot second toe.     Action/Plan:   [place] from [place] , Child psychotherapistocial worker has been notified that patient is ready to return.   Anticipated DC Date:  07/14/2014   Anticipated DC Plan:  SKILLED NURSING FACILITY  In-house referral  Clinical Social Worker      DC Planning Services  CM consult      [facility]  NA   Choice offered to / List presented to:     DME arranged  NA        HH arranged  NA      Status of service:  Completed, signed off Medicare Important Message given?   (If response is "NO", the following Medicare IM given date fields will be blank) Date Medicare IM given:   Medicare IM given by:   Date Additional Medicare IM given:   Additional Medicare IM given by:    Discharge Disposition:  SKILLED NURSING FACILITY  Per UR Regulation:    If discussed at Long Length of Stay Meetings, dates discussed:    Comments:

## 2014-07-14 NOTE — Progress Notes (Signed)
Utilization Review completed.  

## 2014-07-14 NOTE — Discharge Summary (Signed)
Physician Discharge Summary  Patient ID: [name] MRN: [id number] DOB/AGE: 78/08/1932 78 y.o.  Admit date: 07/13/2014 Discharge date: 07/14/2014  Admission Diagnoses: Osteomyelitis left foot second toe  Discharge Diagnoses:  Active Problems:   Osteomyelitis of toe of left foot   Discharged Condition: stable  Hospital Course: Patient's hospital course was essentially unremarkable. She underwent amputation of the second toe. Postoperatively the dressing was dry she was alert oriented and discharged back to skilled nursing.  Consults: None  Significant Diagnostic Studies: labs: Routine labs  Treatments: surgery: See operative note  Discharge Exam: Blood pressure 155/63, pulse 72, temperature 98.5 F (36.9 C), temperature source Oral, resp. rate 16, height 5\' 4"  (1.626 m), weight 53.524 kg (118 lb), SpO2 100 %. Incision/Wound: dressing clean dry and intact  Disposition: 01-Home or Self Care  Discharge Instructions    Call MD / Call 911    Complete by:  As directed   If you experience chest pain or shortness of breath, CALL 911 and be transported to the hospital emergency room.  If you develope a fever above 101 F, pus (white drainage) or increased drainage or redness at the wound, or calf pain, call your surgeon's office.     Change dressing    Complete by:  As directed   Change dressing as needed.     Constipation Prevention    Complete by:  As directed   Drink plenty of fluids.  Prune juice may be helpful.  You may use a stool softener, such as Colace (over the counter) 100 mg twice a day.  Use MiraLax (over the counter) for constipation as needed.     Diet - low sodium heart healthy    Complete by:  As directed      Increase activity slowly as tolerated    Complete by:  As directed      Post-op shoe    Complete by:  As directed      Weight bearing as tolerated    Complete by:  As directed             Medication List    TAKE these medications        acetaminophen 325 MG tablet  Commonly known as:  TYLENOL  Take 2 tablets (650 mg total) by mouth every 6 (six) hours as needed for mild pain or fever.     amiloride-hydrochlorothiazide 5-50 MG tablet  Commonly known as:  MODURETIC  Take 0.5 tablets by mouth daily.     aspirin EC 81 MG tablet  Take 81 mg by mouth every morning.     calcium citrate-vitamin D 315-200 MG-UNIT per tablet  Commonly known as:  CITRACAL+D  Take 2 tablets by mouth every morning.     cephALEXin 500 MG capsule  Commonly known as:  KEFLEX  Take 1 capsule (500 mg total) by mouth 3 (three) times daily.     cholecalciferol 1000 UNITS tablet  Commonly known as:  VITAMIN D  Take 1,000 Units by mouth every morning.     conjugated estrogens vaginal cream  Commonly known as:  PREMARIN  Place 1 Applicatorful vaginally 2 (two) times a week. Uses on Tues and Fri     doxycycline 100 MG tablet  Commonly known as:  VIBRA-TABS  Take 1 tablet (100 mg total) by mouth every 12 (twelve) hours.     DSS 100 MG Caps  Take 100 mg by mouth 2 (two) times daily.     DULoxetine 30 MG  capsule  Commonly known as:  CYMBALTA  Take 30 mg by mouth every morning.     fexofenadine 180 MG tablet  Commonly known as:  ALLEGRA  Take 180 mg by mouth every morning.     GLUCERNA Liqd  Take 237 mLs by mouth 3 (three) times daily. Vanilla only     HYDROcodone-acetaminophen 5-325 MG per tablet  Commonly known as:  NORCO/VICODIN  Take 1 tablet by mouth every 6 (six) hours as needed.     insulin aspart 100 UNIT/ML injection  Commonly known as:  novoLOG  Inject 5 Units into the skin 3 (three) times daily before meals.     insulin glargine 100 UNIT/ML injection  Commonly known as:  LANTUS  Inject 0.1 mLs (10 Units total) into the skin at bedtime.     iron polysaccharides 150 MG capsule  Commonly known as:  NIFEREX  Take 150 mg by mouth 2 (two) times daily.     LORazepam 0.5 MG tablet  Commonly known as:  ATIVAN  Take 1 tablet (0.5  mg total) by mouth every 12 (twelve) hours. For anxiety.     methylcellulose 1 % ophthalmic solution  Commonly known as:  ARTIFICIAL TEARS  Place 1 drop into both eyes daily as needed (for dry eyes).     morphine 15 MG 12 hr tablet  Commonly known as:  MS CONTIN  Take 1 tablet (15 mg total) by mouth every 12 (twelve) hours.     olopatadine 0.1 % ophthalmic solution  Commonly known as:  PATANOL  Place 1 drop into both eyes 2 (two) times daily as needed (itching/watery eyes).     omeprazole 20 MG capsule  Commonly known as:  PRILOSEC  Take 20 mg by mouth every morning.     oxyCODONE-acetaminophen 5-325 MG per tablet  Commonly known as:  PERCOCET/ROXICET  Take 1 tablet by mouth every 4 (four) hours as needed (breakthrough severe pain after administration of MS contin).     potassium chloride SA 20 MEQ tablet  Commonly known as:  K-DUR,KLOR-CON  Take 20 mEq by mouth every morning.     promethazine 12.5 MG tablet  Commonly known as:  PHENERGAN  Take 12.5 mg by mouth every 6 (six) hours as needed for nausea or vomiting.     SENNA-S 8.6-50 MG per tablet  Generic drug:  senna-docusate  Take 2 tablets by mouth at bedtime.           Follow-up Information    Follow up with DUDA,MARCUS V, MD In 2 weeks.   Specialty:  Orthopedic Surgery   Contact information:   [address] [place] [address] [phone]       Signed: Nadara MustardDUDA,MARCUS V 07/14/2014, 7:12 AM

## 2014-07-14 NOTE — Evaluation (Signed)
Physical Therapy Evaluation Patient Details Name: Tonya ChildsBertha L Bob MRN: [id number] DOB: 07/03/1933 Today's Date: 07/14/2014   History of Present Illness  pt is s/p amputation Lt 2nd toe on 07/13/14.  Clinical Impression  Patient is s/p above surgery resulting in functional limitations due to the deficits listed below (see PT Problem List). Patient will benefit from skilled PT to increase their independence and safety with mobility to allow discharge to the venue listed below. Pt from [facility], planned to D/C back today. Ortho tech phoned regarding need for post-op shoe. Pt will need 24/7 (A) upon D/C to [facility].      Follow Up Recommendations SNF;Supervision/Assistance - 24 hour    Equipment Recommendations  None recommended by PT    Recommendations for Other Services OT consult     Precautions / Restrictions Precautions Precautions: Fall Required Braces or Orthoses: Other Brace/Splint Other Brace/Splint: post op shoe not in room - ortho tech made aware  Restrictions Weight Bearing Restrictions: Yes LLE Weight Bearing: Touchdown weight bearing      Mobility  Bed Mobility Overal bed mobility: Needs Assistance Bed Mobility: Supine to Sit;Sit to Supine;Rolling Rolling: Min assist   Supine to sit: Min assist;HOB elevated Sit to supine: Min assist;HOB elevated   General bed mobility comments: (A) to advance and control LT LE off and onto bed; limited due to pain; cues for sequencing  Transfers                 General transfer comment: no post op shoe in room  Ambulation/Gait                Stairs            Wheelchair Mobility    Modified Rankin (Stroke Patients Only)       Balance Overall balance assessment: Needs assistance;History of Falls Sitting-balance support: Feet supported;Single extremity supported Sitting balance-Leahy Scale: Poor Sitting balance - Comments: Rt UE bracing herself at EOB at all times       Standing balance  comment: no post op shoe in room                             Pertinent Vitals/Pain Pain Assessment: Faces Faces Pain Scale: Hurts little more Pain Location: Lt foot with movement Pain Descriptors / Indicators: Grimacing Pain Intervention(s): Monitored during session;Premedicated before session;Repositioned    Home Living Family/patient expects to be discharged to:: Skilled nursing facility                 Additional Comments: husband present to (A) with PLOF; per husband pt was in wheelchair predominately for past week; was able to stand with (A) to transfer; prior to last week was ambulatory for short distances with RW     Prior Function Level of Independence: Needs assistance   Gait / Transfers Assistance Needed: wheelchair fo ~1 week; short distance ambulation with RW prior to last week  ADL's / Homemaking Assistance Needed: "some" (A) with bathing/dressing        Hand Dominance   Dominant Hand: Right    Extremity/Trunk Assessment   Upper Extremity Assessment: Defer to OT evaluation           Lower Extremity Assessment: LLE deficits/detail;Generalized weakness;Difficult to assess due to impaired cognition   LLE Deficits / Details: Lt hip 3/5; quad 3/5  Cervical / Trunk Assessment: Kyphotic  Communication   Communication: No difficulties  Cognition Arousal/Alertness: Awake/alert Behavior During  Therapy: Flat affect Overall Cognitive Status: Impaired/Different from baseline Area of Impairment: Awareness;Problem solving;Safety/judgement;Attention;Memory   Current Attention Level: Sustained Memory: Decreased short-term memory   Safety/Judgement: Decreased awareness of deficits;Decreased awareness of safety Awareness: Intellectual Problem Solving: Slow processing;Decreased initiation;Requires verbal cues;Requires tactile cues General Comments: pt repeating multipel statements and confused regarding precautions due to new surgery    General  Comments General comments (skin integrity, edema, etc.): pt tolerated sitting EOB ~5 min; dressing appeared dry    Exercises General Exercises - Lower Extremity Ankle Circles/Pumps: AROM;Both;10 reps Long Arc Quad: AROM;Strengthening;Both;10 reps;Seated Hip ABduction/ADduction: AROM;Strengthening;Both;10 reps;Supine Hip Flexion/Marching: AROM;Strengthening;Both;10 reps;Seated      Assessment/Plan    PT Assessment Patient needs continued PT services  PT Diagnosis Difficulty walking;Generalized weakness;Acute pain   PT Problem List Decreased strength;Decreased range of motion;Decreased activity tolerance;Decreased balance;Decreased mobility;Decreased cognition;Decreased knowledge of use of DME;Decreased safety awareness;Decreased knowledge of precautions;Pain  PT Treatment Interventions Functional mobility training;Therapeutic exercise;DME instruction;Balance training;Neuromuscular re-education;Patient/family education   PT Goals (Current goals can be found in the Care Plan section) Acute Rehab PT Goals Patient Stated Goal: to go back to [facility] today PT Goal Formulation: With patient/family Time For Goal Achievement: 07/28/14 Potential to Achieve Goals: Fair    Frequency Min 2X/week   Barriers to discharge        Co-evaluation               End of Session   Activity Tolerance: Patient tolerated treatment well Patient left: in bed;with call bell/phone within reach;with nursing/sitter in room Nurse Communication: Mobility status;Precautions;Weight bearing status    Functional Assessment Tool Used: clinical judgement  Functional Limitation: Mobility: Walking and moving around Mobility: Walking and Moving Around Current Status ([id number]): At least 20 percent but less than 40 percent impaired, limited or restricted Mobility: Walking and Moving Around Goal Status [phone]([id number]): At least 1 percent but less than 20 percent impaired, limited or restricted Mobility: Walking and Moving  Around Discharge Status [phone]([id number]): At least 20 percent but less than 40 percent impaired, limited or restricted    Time: 0923-0937 PT Time Calculation (min) (ACUTE ONLY): 14 min   Charges:   PT Evaluation $Initial PT Evaluation Tier I: 1 Procedure PT Treatments $Therapeutic Exercise: 8-22 mins   PT G Codes:   Functional Assessment Tool Used: clinical judgement  Functional Limitation: Mobility: Walking and moving around    [place], Dollar Bay[place]ittany N, [place]  [phone] 07/14/2014, 9:47 AM

## 2014-07-15 DIAGNOSIS — M869 Osteomyelitis, unspecified: Secondary | ICD-10-CM | POA: Diagnosis not present

## 2014-07-15 LAB — GLUCOSE, CAPILLARY: Glucose-Capillary: 391 mg/dL — ABNORMAL HIGH (ref 70–99)

## 2014-07-15 NOTE — Progress Notes (Signed)
Gave report to Kandis FantasiaKaren Fox at  [facility]. Pt to be transferred by son. Pt has a packet to deliver to nursing care.  MCCLAIN, Kenard Morawski L 07/15/2014 11:42 AM

## 2014-07-16 ENCOUNTER — Encounter (HOSPITAL_COMMUNITY): Payer: Self-pay | Admitting: Orthopedic Surgery

## 2014-07-17 NOTE — Anesthesia Postprocedure Evaluation (Signed)
Anesthesia Post Note  Patient: [name]  Procedure(s) Performed: Procedure(s) (LRB): Amputation 2nd Toe MTP (Left)  Anesthesia type: MAC and nerve block  Patient location: PACU  Post pain: Pain level controlled and Adequate analgesia  Post assessment: Post-op Vital signs reviewed, Patient's Cardiovascular Status Stable and Respiratory Function Stable  Last Vitals:  Filed Vitals:   07/15/14 0507  BP: 138/64  Pulse: 76  Temp: 37.1 C  Resp: 18    Post vital signs: Reviewed and stable  Level of consciousness: awake, alert  and oriented  Complications: No apparent anesthesia complications

## 2014-08-13 ENCOUNTER — Ambulatory Visit (INDEPENDENT_AMBULATORY_CARE_PROVIDER_SITE_OTHER): Payer: Medicare Other | Admitting: Podiatry

## 2014-08-13 DIAGNOSIS — M79673 Pain in unspecified foot: Secondary | ICD-10-CM

## 2014-08-13 DIAGNOSIS — B351 Tinea unguium: Secondary | ICD-10-CM

## 2014-08-15 NOTE — Progress Notes (Signed)
Subjective:     Patient ID: [name], female   DOB: 01/06/1933, 81 y.o.   MRN: [id number]  HPI patient presents with painful nailbeds 345 of both feet after having amputation of the hallux second toes secondary to vascular insult   Review of Systems     Objective:   Physical Exam Neurovascular status unchanged with thick yellow brittle nailbeds 345 of both feet that are painful when pressed and impossible for patient or caregiver to cut    Assessment:     At risk patient with mycotic nail infection 345 of both feet    Plan:     Debride painful nailbeds 34 and 5 of both feet with no iatrogenic bleeding noted and reappoint in 3 months

## 2014-11-12 ENCOUNTER — Ambulatory Visit: Payer: Medicare Other

## 2014-11-20 ENCOUNTER — Emergency Department (HOSPITAL_COMMUNITY)
Admission: EM | Admit: 2014-11-20 | Discharge: 2014-11-20 | Disposition: A | Discharge status: Home / Self Care | Payer: Medicare Other | Attending: Emergency Medicine | Admitting: Emergency Medicine

## 2014-11-20 ENCOUNTER — Emergency Department (HOSPITAL_COMMUNITY): Payer: Medicare Other

## 2014-11-20 ENCOUNTER — Encounter (HOSPITAL_COMMUNITY): Payer: Self-pay | Admitting: Emergency Medicine

## 2014-11-20 DIAGNOSIS — S79912A Unspecified injury of left hip, initial encounter: Secondary | ICD-10-CM | POA: Insufficient documentation

## 2014-11-20 DIAGNOSIS — Z79899 Other long term (current) drug therapy: Secondary | ICD-10-CM | POA: Diagnosis not present

## 2014-11-20 DIAGNOSIS — Y998 Other external cause status: Secondary | ICD-10-CM | POA: Insufficient documentation

## 2014-11-20 DIAGNOSIS — S8992XA Unspecified injury of left lower leg, initial encounter: Secondary | ICD-10-CM | POA: Insufficient documentation

## 2014-11-20 DIAGNOSIS — Z8619 Personal history of other infectious and parasitic diseases: Secondary | ICD-10-CM | POA: Insufficient documentation

## 2014-11-20 DIAGNOSIS — K219 Gastro-esophageal reflux disease without esophagitis: Secondary | ICD-10-CM | POA: Insufficient documentation

## 2014-11-20 DIAGNOSIS — I10 Essential (primary) hypertension: Secondary | ICD-10-CM | POA: Insufficient documentation

## 2014-11-20 DIAGNOSIS — Z8701 Personal history of pneumonia (recurrent): Secondary | ICD-10-CM | POA: Insufficient documentation

## 2014-11-20 DIAGNOSIS — G629 Polyneuropathy, unspecified: Secondary | ICD-10-CM | POA: Insufficient documentation

## 2014-11-20 DIAGNOSIS — I209 Angina pectoris, unspecified: Secondary | ICD-10-CM | POA: Diagnosis not present

## 2014-11-20 DIAGNOSIS — Y9389 Activity, other specified: Secondary | ICD-10-CM | POA: Diagnosis not present

## 2014-11-20 DIAGNOSIS — E119 Type 2 diabetes mellitus without complications: Secondary | ICD-10-CM | POA: Diagnosis not present

## 2014-11-20 DIAGNOSIS — S99912A Unspecified injury of left ankle, initial encounter: Secondary | ICD-10-CM | POA: Diagnosis not present

## 2014-11-20 DIAGNOSIS — Z792 Long term (current) use of antibiotics: Secondary | ICD-10-CM | POA: Insufficient documentation

## 2014-11-20 DIAGNOSIS — M159 Polyosteoarthritis, unspecified: Secondary | ICD-10-CM | POA: Insufficient documentation

## 2014-11-20 DIAGNOSIS — S3992XA Unspecified injury of lower back, initial encounter: Secondary | ICD-10-CM | POA: Diagnosis present

## 2014-11-20 DIAGNOSIS — S50311A Abrasion of right elbow, initial encounter: Secondary | ICD-10-CM | POA: Diagnosis not present

## 2014-11-20 DIAGNOSIS — W010XXA Fall on same level from slipping, tripping and stumbling without subsequent striking against object, initial encounter: Secondary | ICD-10-CM | POA: Diagnosis not present

## 2014-11-20 DIAGNOSIS — M79673 Pain in unspecified foot: Secondary | ICD-10-CM

## 2014-11-20 DIAGNOSIS — Y92128 Other place in nursing home as the place of occurrence of the external cause: Secondary | ICD-10-CM | POA: Diagnosis not present

## 2014-11-20 DIAGNOSIS — Z7982 Long term (current) use of aspirin: Secondary | ICD-10-CM | POA: Insufficient documentation

## 2014-11-20 DIAGNOSIS — S99922A Unspecified injury of left foot, initial encounter: Secondary | ICD-10-CM | POA: Insufficient documentation

## 2014-11-20 DIAGNOSIS — F329 Major depressive disorder, single episode, unspecified: Secondary | ICD-10-CM | POA: Insufficient documentation

## 2014-11-20 DIAGNOSIS — S32010A Wedge compression fracture of first lumbar vertebra, initial encounter for closed fracture: Secondary | ICD-10-CM | POA: Diagnosis not present

## 2014-11-20 DIAGNOSIS — S32020A Wedge compression fracture of second lumbar vertebra, initial encounter for closed fracture: Secondary | ICD-10-CM | POA: Diagnosis not present

## 2014-11-20 DIAGNOSIS — Z794 Long term (current) use of insulin: Secondary | ICD-10-CM | POA: Insufficient documentation

## 2014-11-20 DIAGNOSIS — M545 Low back pain, unspecified: Secondary | ICD-10-CM

## 2014-11-20 DIAGNOSIS — S32000A Wedge compression fracture of unspecified lumbar vertebra, initial encounter for closed fracture: Secondary | ICD-10-CM

## 2014-11-20 LAB — BASIC METABOLIC PANEL
Anion gap: 5 (ref 5–15)
BUN: 21 mg/dL (ref 6–23)
CALCIUM: 8.8 mg/dL (ref 8.4–10.5)
CHLORIDE: 99 mmol/L (ref 96–112)
CO2: 32 mmol/L (ref 19–32)
Creatinine, Ser: 0.67 mg/dL (ref 0.50–1.10)
GFR calc Af Amer: >90 mL/min (ref >90)
GFR, EST NON AFRICAN AMERICAN: 81 mL/min — AB (ref >90)
GLUCOSE: 296 mg/dL — AB (ref 70–99)
Potassium: 3.7 mmol/L (ref 3.5–5.1)
SODIUM: 136 mmol/L (ref 135–145)

## 2014-11-20 LAB — CBG MONITORING, ED: Glucose-Capillary: 282 mg/dL — ABNORMAL HIGH (ref 70–99)

## 2014-11-20 LAB — CBC
HCT: 37.6 % (ref 36.0–46.0)
Hemoglobin: 12.2 g/dL (ref 12.0–15.0)
MCH: 28.6 pg (ref 26.0–34.0)
MCHC: 32.4 g/dL (ref 30.0–36.0)
MCV: 88.1 fL (ref 78.0–100.0)
PLATELETS: 173 10*3/uL (ref 150–400)
RBC: 4.27 MIL/uL (ref 3.87–5.11)
RDW: 13.3 % (ref 11.5–15.5)
WBC: 4.9 10*3/uL (ref 4.0–10.5)

## 2014-11-20 MED ORDER — OXYCODONE-ACETAMINOPHEN 5-325 MG PO TABS
1.0000 | ORAL_TABLET | Freq: Once | ORAL | Status: AC
  Administered 2014-11-20: 1 via ORAL
  Filled 2014-11-20: qty 1

## 2014-11-20 NOTE — Discharge Instructions (Signed)
Back Pain, Adult °Low back pain is very common. About 1 in 5 people have back pain. The cause of low back pain is rarely dangerous. The pain often gets better over time. About half of people with a sudden onset of back pain feel better in just 2 weeks. About 8 in 10 people feel better by 6 weeks.  °CAUSES °Some common causes of back pain include: °· Strain of the muscles or ligaments supporting the spine. °· Wear and tear (degeneration) of the spinal discs. °· Arthritis. °· Direct injury to the back. °DIAGNOSIS °Most of the time, the direct cause of low back pain is not known. However, back pain can be treated effectively even when the exact cause of the pain is unknown. Answering your caregiver's questions about your overall health and symptoms is one of the most accurate ways to make sure the cause of your pain is not dangerous. If your caregiver needs more information, he or she may order lab work or imaging tests (X-rays or MRIs). However, even if imaging tests show changes in your back, this usually does not require surgery. °HOME CARE INSTRUCTIONS °For many people, back pain returns. Since low back pain is rarely dangerous, it is often a condition that people can learn to manage on their own.  °· Remain active. It is stressful on the back to sit or stand in one place. Do not sit, drive, or stand in one place for more than 30 minutes at a time. Take short walks on level surfaces as soon as pain allows. Try to increase the length of time you walk each day. °· Do not stay in bed. Resting more than 1 or 2 days can delay your recovery. °· Do not avoid exercise or work. Your body is made to move. It is not dangerous to be active, even though your back may hurt. Your back will likely heal faster if you return to being active before your pain is gone. °· Pay attention to your body when you  bend and lift. Many people have less discomfort when lifting if they bend their knees, keep the load close to their bodies, and  avoid twisting. Often, the most comfortable positions are those that put less stress on your recovering back. °· Find a comfortable position to sleep. Use a firm mattress and lie on your side with your knees slightly bent. If you lie on your back, put a pillow under your knees. °· Only take over-the-counter or prescription medicines as directed by your caregiver. Over-the-counter medicines to reduce pain and inflammation are often the most helpful. Your caregiver may prescribe muscle relaxant drugs. These medicines help dull your pain so you can more quickly return to your normal activities and healthy exercise. °· Put ice on the injured area. °¨ Put ice in a plastic bag. °¨ Place a towel between your skin and the bag. °¨ Leave the ice on for 15-20 minutes, 03-04 times a day for the first 2 to 3 days. After that, ice and heat may be alternated to reduce pain and spasms. °· Ask your caregiver about trying back exercises and gentle massage. This may be of some benefit. °· Avoid feeling anxious or stressed. Stress increases muscle tension and can worsen back pain. It is important to recognize when you are anxious or stressed and learn ways to manage it. Exercise is a great option. °SEEK MEDICAL CARE IF: °· You have pain that is not relieved with rest or medicine. °· You have pain that does not improve in 1 week. °· You have new symptoms. °· You are generally not feeling well. °SEEK   IMMEDIATE MEDICAL CARE IF:   You have pain that radiates from your back into your legs.  You develop new bowel or bladder control problems.  You have unusual weakness or numbness in your arms or legs.  You develop nausea or vomiting.  You develop abdominal pain.  You feel faint. Document Released: 07/27/2005 Document Revised: 01/26/2012 Document Reviewed: 11/28/2013 [facility] Patient Information 2015 [place], [place]. This information is not intended to replace advice given to you by your health care provider. Make sure you  discuss any questions you have with your health care provider.  Back, Compression Fracture A compression fracture happens when a force is put upon the length of your spine. Slipping and falling on your bottom are examples of such a force. When this happens, sometimes the force is great enough to compress the building blocks (vertebral bodies) of your spine. Although this causes a lot of pain, this can usually be treated at home, unless your caregiver feels hospitalization is needed for pain control. Your backbone (spinal column) is made up of 24 main vertebral bodies in addition to the sacrum and coccyx (see illustration). These are held together by tough fibrous tissues (ligaments) and by support of your muscles. Nerve roots pass through the openings between the vertebrae. A sudden wrenching move, injury, or a fall may cause a compression fracture of one of the vertebral bodies. This may result in back pain or spread of pain into the belly (abdomen), the buttocks, and down the leg into the foot. Pain may also be created by muscle spasm alone. Large studies have been undertaken to determine the best possible course of action to help your back following injury and also to prevent future problems. The recommendations are as follows. FOLLOWING A COMPRESSION FRACTURE: Do the following only if advised by your caregiver.   If a back brace has been suggested or provided, wear it as directed.  Do not stop wearing the back brace unless instructed by your caregiver.  When allowed to return to regular activities, avoid a sedentary lifestyle. Actively exercise. Sporadic weekend binges of tennis, racquetball, or waterskiing may actually aggravate or create problems, especially if you are not in condition for that activity.  Avoid sports requiring sudden body movements until you are in condition for them. Swimming and walking are safer activities.  Maintain good posture.  Avoid obesity.  If not already done,  you should have a DEXA scan. Based on the results, be treated for osteoporosis. FOLLOWING ACUTE (SUDDEN) INJURY:  Only take over-the-counter or prescription medicines for pain, discomfort, or fever as directed by your caregiver.  Use bed rest for only the most extreme acute episode. Prolonged bed rest may aggravate your condition. Ice used for acute conditions is effective. Use a large plastic bag filled with ice. Wrap it in a towel. This also provides excellent pain relief. This may be continuous. Or use it for 30 minutes every 2 hours during acute phase, then as needed. Heat for 30 minutes prior to activities is helpful.  As soon as the acute phase (the time when your back is too painful for you to do normal activities) is over, it is important to resume normal activities and work Arboriculturist. Back injuries can cause potentially marked changes in lifestyle. So it is important to attack these problems aggressively.  See your caregiver for continued problems. He or she can help or refer you for appropriate exercises, physical therapy, and work hardening if needed.  If you are given narcotic  medications for your condition, for the next 24 hours do not:  Drive.  Operate machinery or power tools.  Sign legal documents.  Do not drink alcohol, or take sleeping pills or other medications that may interfere with treatment. If your caregiver has given you a follow-up appointment, it is very important to keep that appointment. Not keeping the appointment could result in a chronic or permanent injury, pain, and disability. If there is any problem keeping the appointment, you must call back to this facility for assistance.  SEEK IMMEDIATE MEDICAL CARE IF:  You develop numbness, tingling, weakness, or problems with the use of your arms or legs.  You develop severe back pain not relieved with medications.  You have changes in bowel or bladder control.  You have increasing pain in any areas of  the body. Document Released: 07/27/2005 Document Revised: 12/11/2013 Document Reviewed: 02/29/2008 [employer] Patient Information 2015 [place], [place]. This information is not intended to replace advice given to you by your health care provider. Make sure you discuss any questions you have with your health care provider.

## 2014-11-20 NOTE — ED Provider Notes (Signed)
CSN: [id number]     Arrival date & time 11/20/14  0244 History   First MD Initiated Contact with Patient 11/20/14 0308     Chief Complaint  Patient presents with  . Foot Pain     (Consider location/radiation/quality/duration/timing/severity/associated sxs/prior Treatment) Patient is a 81 y.o. female presenting with back pain. The history is provided by the patient.  Back Pain Location:  Lumbar spine Quality:  Aching Radiates to:  L posterior upper leg and L knee Pain severity:  Moderate Pain is:  Same all the time Onset quality:  Gradual Timing:  Constant Progression:  Unchanged Chronicity:  New Context: falling (yesterday, tripped over her feet)   Relieved by:  Nothing Worsened by:  Nothing tried Associated symptoms: no abdominal pain, no chest pain and no fever     Past Medical History  Diagnosis Date  . Incontinence of urine   . Diarrhea   . Neuropathy     bilateral feet  . GERD (gastroesophageal reflux disease)   . Depression   . Urinary, incontinence, stress female   . History of shingles 2009  . Degenerative lumbar spinal stenosis   . Type II diabetes mellitus     Type 2 IDDM  X 40 years;   . Arthritis     lower back/hands  . Altered mental status 01/13/2013  . Hypertension   . Pneumonia   . Anginal pain    Past Surgical History  Procedure Laterality Date  . Incontinence surgery  2005  . Tonsillectomy  1964  . Abdominal hysterectomy  1982  . Ovarian cyst surgery  1972  . Cholecystectomy  1988  . Cystoscopy  2009    macroplastique inj  . Rectocele repair  10/15/2011    Procedure: POSTERIOR REPAIR (RECTOCELE);  Surgeon: Kathi Ludwig, MD;  Location: [facility];  Service: Urology;;  posterior vaginal vault repair with sacral spinus repair with graft  . Cystoscopy  10/15/2011    Procedure: CYSTOSCOPY;  Surgeon: Kathi Ludwig, MD;  Location: [facility];  Service: Urology;  Laterality: N/A;  . Flexible sigmoidoscopy   02/28/2012    Procedure: FLEXIBLE SIGMOIDOSCOPY;  Surgeon: Willis Modena, MD;  Location: [facility];  Service: Endoscopy;  Laterality: N/A;  . Breast biopsy  1990    bilaterally  . Cataract extraction w/ intraocular lens implant    . Posterior fusion lumbar spine  05/09/2012  . Amputation Right 03/30/2014    Procedure: Right Great Toe Amputation at MTP Joint;  Surgeon: Nadara Mustard, MD;  Location: [facility] OR;  Service: Orthopedics;  Laterality: Right;  . Amputation N/A 06/03/2014    Procedure: AMPUTATION LEFT GREAT TOE  AND  RIGHT SECOND TOE;  Surgeon: Nadara Mustard, MD;  Location: [facility] OR;  Service: Orthopedics;  Laterality: N/A;  . Amputation Left 07/13/2014    Procedure: Amputation 2nd Toe MTP;  Surgeon: Nadara Mustard, MD;  Location: [facility] OR;  Service: Orthopedics;  Laterality: Left;   Family History  Problem Relation Age of Onset  . Osteoarthritis Sister   . Osteoarthritis Brother    History  Substance Use Topics  . Smoking status: Never Smoker   . Smokeless tobacco: Never Used  . Alcohol Use: No   OB History    No data available     Review of Systems  Constitutional: Negative for fever.  Respiratory: Negative for cough and shortness of breath.   Cardiovascular: Negative for chest pain and leg swelling.  Gastrointestinal: Negative for vomiting  and abdominal pain.  Musculoskeletal: Positive for back pain.  All other systems reviewed and are negative.     Allergies  Ciprofloxacin; Lidoderm; Ace inhibitors; Angiotensin receptor blockers; Glucotrol; Triple antibiotic; Neosporin; Sulfa antibiotics; and Zyrtec  Home Medications   Prior to Admission medications   Medication Sig Start Date End Date Taking? Authorizing Provider  acetaminophen (TYLENOL) 325 MG tablet Take 2 tablets (650 mg total) by mouth every 6 (six) hours as needed for mild pain or fever. 03/20/14  Yes Richarda Overlie, MD  amiloride-hydrochlorothiazide (MODURETIC) 5-50 MG tablet Take 0.5 tablets by mouth daily.   Yes  Historical Provider, MD  aspirin EC 81 MG tablet Take 81 mg by mouth every morning.    Yes Historical Provider, MD  calcium citrate-vitamin D (CITRACAL+D) 315-200 MG-UNIT per tablet Take 2 tablets by mouth every morning.    Yes Historical Provider, MD  docusate sodium 100 MG CAPS Take 100 mg by mouth 2 (two) times daily. 06/06/14  Yes Penny Pia, MD  DULoxetine (CYMBALTA) 30 MG capsule Take 30 mg by mouth every morning.    Yes Historical Provider, MD  fexofenadine (ALLEGRA) 180 MG tablet Take 180 mg by mouth every morning.    Yes Historical Provider, MD  GLUCERNA (GLUCERNA) LIQD Take 237 mLs by mouth 3 (three) times daily. Vanilla only   Yes Historical Provider, MD  HYDROcodone-acetaminophen (NORCO/VICODIN) 5-325 MG per tablet Take 1 tablet by mouth every 6 (six) hours as needed. 07/03/14  Yes Roxy Horseman, PA-C  insulin glargine (LANTUS) 100 UNIT/ML injection Inject 0.1 mLs (10 Units total) into the skin at bedtime. Patient taking differently: Inject 11 Units into the skin at bedtime.  06/06/14  Yes Penny Pia, MD  insulin lispro (HUMALOG) 100 UNIT/ML injection Inject 4 Units into the skin 3 (three) times daily before meals.   Yes Historical Provider, MD  iron polysaccharides (NIFEREX) 150 MG capsule Take 150 mg by mouth 2 (two) times daily.   Yes Historical Provider, MD  LORazepam (ATIVAN) 0.5 MG tablet Take 1 tablet (0.5 mg total) by mouth every 12 (twelve) hours. For anxiety. 03/20/14  Yes Richarda Overlie, MD  methylcellulose (ARTIFICIAL TEARS) 1 % ophthalmic solution Place 1 drop into both eyes daily as needed (for dry eyes).   Yes Historical Provider, MD  morphine (MS CONTIN) 15 MG 12 hr tablet Take 1 tablet (15 mg total) by mouth every 12 (twelve) hours. 06/06/14  Yes Penny Pia, MD  olopatadine (PATANOL) 0.1 % ophthalmic solution Place 1 drop into both eyes 2 (two) times daily as needed (itching/watery eyes). 03/20/14  Yes Richarda Overlie, MD  omeprazole (PRILOSEC) 20 MG capsule Take 20 mg  by mouth every morning.    Yes Historical Provider, MD  polyethylene glycol (MIRALAX / GLYCOLAX) packet Take 17 g by mouth daily as needed for mild constipation.   Yes Historical Provider, MD  potassium chloride SA (K-DUR,KLOR-CON) 20 MEQ tablet Take 20 mEq by mouth every morning.    Yes Historical Provider, MD  promethazine (PHENERGAN) 12.5 MG tablet Take 12.5 mg by mouth every 6 (six) hours as needed for nausea or vomiting.   Yes Historical Provider, MD  senna-docusate (SENNA-S) 8.6-50 MG per tablet Take 2 tablets by mouth at bedtime.   Yes Historical Provider, MD  cephALEXin (KEFLEX) 500 MG capsule Take 1 capsule (500 mg total) by mouth 3 (three) times daily. Patient not taking: Reported on 11/20/2014 07/03/14   Roxy Horseman, PA-C  doxycycline (VIBRA-TABS) 100 MG tablet Take 1 tablet (100  mg total) by mouth every 12 (twelve) hours. Patient not taking: Reported on 11/20/2014 06/06/14   Penny Piarlando Vega, MD  oxyCODONE-acetaminophen (PERCOCET/ROXICET) 5-325 MG per tablet Take 1 tablet by mouth every 4 (four) hours as needed (breakthrough severe pain after administration of MS contin). Patient not taking: Reported on 11/20/2014 06/06/14   Penny Piarlando Vega, MD   BP 137/83 mmHg  Pulse 73  Temp(Src) 98.1 F (36.7 C) (Oral)  Resp 14  SpO2 95% Physical Exam  Constitutional: She is oriented to person, place, and time. She appears well-developed and well-nourished. No distress.  HENT:  Head: Normocephalic and atraumatic.  Mouth/Throat: Oropharynx is clear and moist.  Eyes: EOM are normal. Pupils are equal, round, and reactive to light.  Neck: Normal range of motion. Neck supple.  Cardiovascular: Normal rate and regular rhythm.  Exam reveals no friction rub.   No murmur heard. Pulmonary/Chest: Effort normal and breath sounds normal. No respiratory distress. She has no wheezes. She has no rales.  Abdominal: Soft. She exhibits no distension. There is no tenderness. There is no rebound.  Musculoskeletal:  She exhibits no edema.       Left hip: She exhibits bony tenderness (lateral hip).       Left knee: She exhibits decreased range of motion and swelling. Tenderness (mild, diffuse) found.       Left ankle: She exhibits decreased range of motion and swelling (mild, diffuse). Tenderness (diffuse).  Neurological: She is alert and oriented to person, place, and time. No cranial nerve deficit. She exhibits normal muscle tone. Coordination normal.  Skin: No rash noted. She is not diaphoretic.  Nursing note and vitals reviewed.   ED Course  Procedures (including critical care time) Labs Review Labs Reviewed  CBG MONITORING, ED - Abnormal; Notable for the following:    Glucose-Capillary 282 (*)    All other components within normal limits    Imaging Review Dg Lumbar Spine Complete  11/20/2014   CLINICAL DATA:  Fall with left-sided pain down the leg.  EXAM: LUMBAR SPINE - COMPLETE 4+ VIEW  COMPARISON:  09/23/2013  FINDINGS: There are compression fractures of L1 and L2 with severe height loss, L1 is vertebra plana. The L2 fracture was previously widened, currently collapsed. There is new compression deformity and sclerosis of the T12 vertebral body, with approximately 50% height loss. The sclerotic appearance favors chronicity. Height loss has resulted and progressive kyphosis centered at the thoracolumbar junction. There is chronic but progressive advanced lumbar levoscoliosis.  Status post L3-4 and L4-5 discectomy with posterior rod and pedicle screw fixation. The L3 pedicle screws continue to extend to the upper endplate of L3. Compression deformity of L5 is chronic based on lumbar spine CT 09/04/2013. No evidence of interval fracture or hardware loosening  IMPRESSION: 1. T12 moderate compression fracture is new from 09/23/2013, but appears chronic. 2. L1 and L2 chronic compression fractures with severe height loss. 3. Acute on chronic fracture at the above levels cannot be excluded due to extensive  anatomic distortion and osteopenia. 4. Chronic L5 compression fracture. 5. L3-4 and L4-5 discectomy.   Electronically Signed   By: Marnee SpringJonathon  Watts M.D.   On: 11/20/2014 05:24   Dg Pelvis 1-2 Views  11/20/2014   CLINICAL DATA:  Fall with low back pain on the left side.  EXAM: PELVIS - 1-2 VIEW  COMPARISON:  09/04/2013  FINDINGS: No evidence of pelvic ring fracture or diastasis. Both hips appear located and intact. Sclerotic and mildly aspheric left femoral head is chronic.  Two level lower lumbar discectomy with posterior fusion.  IMPRESSION: No acute osseous findings.   Electronically Signed   By: Marnee Spring M.D.   On: 11/20/2014 05:14   Dg Knee Complete 4 Views Left  11/20/2014   CLINICAL DATA:  Fall with patellar abrasions.  EXAM: LEFT KNEE - COMPLETE 4+ VIEW  COMPARISON:  None.  FINDINGS: There is no evidence of fracture, dislocation, or joint effusion.  Osteopenia and mild chondrocalcinosis. Diffuse atherosclerotic calcification.  IMPRESSION: No acute osseous findings.   Electronically Signed   By: Marnee Spring M.D.   On: 11/20/2014 05:15   Dg Foot Complete Left  11/20/2014   CLINICAL DATA:  Fall at nursing home with foot pain.  EXAM: LEFT FOOT - COMPLETE 3+ VIEW  COMPARISON:  07/03/2014  FINDINGS: Chronic findings include second and first digit amputations and marked osteopenia. There is new sclerosis and compaction of the third metatarsal head. There is no evidence of acute fracture or traumatic malalignment.  IMPRESSION: 1. No acute traumatic findings. 2. New sclerosis and deformity of the third metatarsal head, question interval bone infarction.   Electronically Signed   By: Marnee Spring M.D.   On: 11/20/2014 05:12     EKG Interpretation None      MDM   Final diagnoses:  Foot pain  Lower back pain  Compression fracture of lumbar vertebra, closed, initial encounter    81 year old female here with left ankle, knee, lower back pain. She sustained a fall last night but was not  sent out from her assisted living facility. Felt because she tripped over her own 2 feet. She had a small abrasion on her right elbow at that time. Tonight she was complaining to the med tech at her assisted living facility that her left leg was hurting. She is having difficulty walking. Here she has stable vitals. She is alert and talking to me and answering questions. She did get hydrocodone prior to coming but is still having pain. Left ankle is mildly swollen decreased range of motion. Also has decreased range of motion in her left knee, left hip. We'll do x-rays of the left lower extremity. We'll check labs to make sure she's not having an electrolyte abnormality causing her muscle jerking. Labs ok. Xrays with new compression fractures since prior films, however no acute fractures. Ambulating ok here. I spoke with PCP who will help arrange f/u.    Elwin Mocha, MD 11/20/14 [id number]

## 2014-11-20 NOTE — ED Notes (Signed)
Pt states having shooting pains in L hip down to L ankle, denies injury.

## 2014-11-20 NOTE — ED Notes (Signed)
[place] from [place]. Pt here via [facility]. She has a history of diabetes CBG 295. She has neuropathy complaining of left foot pain starting a hour and a half ago. Staff gave hydrocodone about 30 min ago with no relief.

## 2014-11-20 NOTE — ED Notes (Signed)
Pt able to ambulate with walker, c/o of pain in back but steady on feet.

## 2014-11-20 NOTE — ED Notes (Signed)
Pts husband states that she fell yesterday and complained of back pain but the facility didn't send her out

## 2014-11-20 NOTE — ED Notes (Signed)
Bed: RESA Expected date: 11/20/14 Expected time: 2:27 AM Means of arrival: Ambulance Comments: 81 yr old, neuropathy

## 2015-01-21 ENCOUNTER — Other Ambulatory Visit (HOSPITAL_COMMUNITY): Payer: Self-pay | Admitting: *Deleted

## 2015-01-23 ENCOUNTER — Encounter (HOSPITAL_COMMUNITY): Payer: Medicare Other | Attending: Internal Medicine

## 2015-02-07 ENCOUNTER — Ambulatory Visit (HOSPITAL_COMMUNITY)
Admission: RE | Admit: 2015-02-07 | Discharge: 2015-02-07 | Disposition: A | Discharge status: Home / Self Care | Payer: Medicare Other | Source: Ambulatory Visit | Attending: Internal Medicine | Admitting: Internal Medicine

## 2015-02-07 DIAGNOSIS — M810 Age-related osteoporosis without current pathological fracture: Secondary | ICD-10-CM | POA: Diagnosis present

## 2015-02-07 MED ORDER — ZOLEDRONIC ACID 5 MG/100ML IV SOLN
INTRAVENOUS | Status: AC
  Filled 2015-02-07: qty 100

## 2015-02-07 MED ORDER — ZOLEDRONIC ACID 5 MG/100ML IV SOLN
5.0000 mg | Freq: Once | INTRAVENOUS | Status: AC
  Administered 2015-02-07: 5 mg via INTRAVENOUS

## 2015-04-01 ENCOUNTER — Encounter (HOSPITAL_COMMUNITY): Payer: Self-pay | Admitting: Emergency Medicine

## 2015-04-01 ENCOUNTER — Inpatient Hospital Stay (HOSPITAL_COMMUNITY)
Admission: EM | Admit: 2015-04-01 | Discharge: 2015-04-03 | DRG: 394 | Disposition: A | Discharge status: Home / Self Care | Payer: Medicare Other | Attending: Internal Medicine | Admitting: Internal Medicine

## 2015-04-01 ENCOUNTER — Emergency Department (HOSPITAL_COMMUNITY): Payer: Medicare Other

## 2015-04-01 DIAGNOSIS — Z89411 Acquired absence of right great toe: Secondary | ICD-10-CM | POA: Diagnosis not present

## 2015-04-01 DIAGNOSIS — Z66 Do not resuscitate: Secondary | ICD-10-CM | POA: Diagnosis present

## 2015-04-01 DIAGNOSIS — E1140 Type 2 diabetes mellitus with diabetic neuropathy, unspecified: Secondary | ICD-10-CM | POA: Diagnosis present

## 2015-04-01 DIAGNOSIS — Z90710 Acquired absence of both cervix and uterus: Secondary | ICD-10-CM | POA: Diagnosis not present

## 2015-04-01 DIAGNOSIS — I10 Essential (primary) hypertension: Secondary | ICD-10-CM | POA: Diagnosis present

## 2015-04-01 DIAGNOSIS — Z89422 Acquired absence of other left toe(s): Secondary | ICD-10-CM | POA: Diagnosis not present

## 2015-04-01 DIAGNOSIS — Z794 Long term (current) use of insulin: Secondary | ICD-10-CM

## 2015-04-01 DIAGNOSIS — K219 Gastro-esophageal reflux disease without esophagitis: Secondary | ICD-10-CM | POA: Diagnosis present

## 2015-04-01 DIAGNOSIS — Z881 Allergy status to other antibiotic agents status: Secondary | ICD-10-CM

## 2015-04-01 DIAGNOSIS — G8929 Other chronic pain: Secondary | ICD-10-CM | POA: Diagnosis present

## 2015-04-01 DIAGNOSIS — Z79899 Other long term (current) drug therapy: Secondary | ICD-10-CM

## 2015-04-01 DIAGNOSIS — Z884 Allergy status to anesthetic agent status: Secondary | ICD-10-CM | POA: Diagnosis not present

## 2015-04-01 DIAGNOSIS — L8990 Pressure ulcer of unspecified site, unspecified stage: Secondary | ICD-10-CM | POA: Insufficient documentation

## 2015-04-01 DIAGNOSIS — R112 Nausea with vomiting, unspecified: Secondary | ICD-10-CM | POA: Diagnosis present

## 2015-04-01 DIAGNOSIS — K6289 Other specified diseases of anus and rectum: Secondary | ICD-10-CM | POA: Diagnosis present

## 2015-04-01 DIAGNOSIS — Z6821 Body mass index (BMI) 21.0-21.9, adult: Secondary | ICD-10-CM | POA: Diagnosis not present

## 2015-04-01 DIAGNOSIS — Z7982 Long term (current) use of aspirin: Secondary | ICD-10-CM | POA: Diagnosis not present

## 2015-04-01 DIAGNOSIS — Z79891 Long term (current) use of opiate analgesic: Secondary | ICD-10-CM | POA: Diagnosis not present

## 2015-04-01 DIAGNOSIS — R197 Diarrhea, unspecified: Secondary | ICD-10-CM | POA: Diagnosis present

## 2015-04-01 DIAGNOSIS — M13842 Other specified arthritis, left hand: Secondary | ICD-10-CM | POA: Diagnosis present

## 2015-04-01 DIAGNOSIS — F329 Major depressive disorder, single episode, unspecified: Secondary | ICD-10-CM | POA: Diagnosis present

## 2015-04-01 DIAGNOSIS — E440 Moderate protein-calorie malnutrition: Secondary | ICD-10-CM | POA: Diagnosis present

## 2015-04-01 DIAGNOSIS — Z882 Allergy status to sulfonamides status: Secondary | ICD-10-CM | POA: Diagnosis not present

## 2015-04-01 DIAGNOSIS — M4855XD Collapsed vertebra, not elsewhere classified, thoracolumbar region, subsequent encounter for fracture with routine healing: Secondary | ICD-10-CM | POA: Diagnosis present

## 2015-04-01 DIAGNOSIS — E119 Type 2 diabetes mellitus without complications: Secondary | ICD-10-CM

## 2015-04-01 DIAGNOSIS — Z89421 Acquired absence of other right toe(s): Secondary | ICD-10-CM | POA: Diagnosis not present

## 2015-04-01 DIAGNOSIS — M4806 Spinal stenosis, lumbar region: Secondary | ICD-10-CM | POA: Diagnosis present

## 2015-04-01 DIAGNOSIS — Z89412 Acquired absence of left great toe: Secondary | ICD-10-CM | POA: Diagnosis not present

## 2015-04-01 DIAGNOSIS — M13841 Other specified arthritis, right hand: Secondary | ICD-10-CM | POA: Diagnosis present

## 2015-04-01 DIAGNOSIS — Z888 Allergy status to other drugs, medicaments and biological substances status: Secondary | ICD-10-CM | POA: Diagnosis not present

## 2015-04-01 DIAGNOSIS — Z9049 Acquired absence of other specified parts of digestive tract: Secondary | ICD-10-CM | POA: Diagnosis present

## 2015-04-01 LAB — COMPREHENSIVE METABOLIC PANEL
ALT: 16 U/L (ref 14–54)
AST: 24 U/L (ref 15–41)
Albumin: 4.1 g/dL (ref 3.5–5.0)
Alkaline Phosphatase: 74 U/L (ref 38–126)
Anion gap: 7 (ref 5–15)
BILIRUBIN TOTAL: 0.7 mg/dL (ref 0.3–1.2)
BUN: 23 mg/dL — ABNORMAL HIGH (ref 6–20)
CO2: 30 mmol/L (ref 22–32)
CREATININE: 0.7 mg/dL (ref 0.44–1.00)
Calcium: 9.6 mg/dL (ref 8.9–10.3)
Chloride: 102 mmol/L (ref 101–111)
GFR calc Af Amer: >60 mL/min (ref >60)
Glucose, Bld: 190 mg/dL — ABNORMAL HIGH (ref 65–99)
Potassium: 3.6 mmol/L (ref 3.5–5.1)
Sodium: 139 mmol/L (ref 135–145)
Total Protein: 6.9 g/dL (ref 6.5–8.1)

## 2015-04-01 LAB — CBC WITH DIFFERENTIAL/PLATELET
Basophils Absolute: 0 10*3/uL (ref 0.0–0.1)
Basophils Relative: 1 % (ref 0–1)
Eosinophils Absolute: 0.1 10*3/uL (ref 0.0–0.7)
Eosinophils Relative: 1 % (ref 0–5)
HEMATOCRIT: 39.7 % (ref 36.0–46.0)
Hemoglobin: 13 g/dL (ref 12.0–15.0)
LYMPHS PCT: 25 % (ref 12–46)
Lymphs Abs: 2 10*3/uL (ref 0.7–4.0)
MCH: 28.3 pg (ref 26.0–34.0)
MCHC: 32.7 g/dL (ref 30.0–36.0)
MCV: 86.5 fL (ref 78.0–100.0)
MONO ABS: 0.6 10*3/uL (ref 0.1–1.0)
Monocytes Relative: 7 % (ref 3–12)
NEUTROS ABS: 5.3 10*3/uL (ref 1.7–7.7)
Neutrophils Relative %: 66 % (ref 43–77)
Platelets: 241 10*3/uL (ref 150–400)
RBC: 4.59 MIL/uL (ref 3.87–5.11)
RDW: 12.9 % (ref 11.5–15.5)
WBC: 8 10*3/uL (ref 4.0–10.5)

## 2015-04-01 LAB — URINALYSIS, ROUTINE W REFLEX MICROSCOPIC
BILIRUBIN URINE: NEGATIVE
GLUCOSE, UA: 100 mg/dL — AB
Hgb urine dipstick: NEGATIVE
KETONES UR: NEGATIVE mg/dL
Leukocytes, UA: NEGATIVE
Nitrite: NEGATIVE
PH: 7 (ref 5.0–8.0)
Protein, ur: NEGATIVE mg/dL
Specific Gravity, Urine: 1.02 (ref 1.005–1.030)
Urobilinogen, UA: 0.2 mg/dL (ref 0.0–1.0)

## 2015-04-01 LAB — CBG MONITORING, ED: GLUCOSE-CAPILLARY: 196 mg/dL — AB (ref 65–99)

## 2015-04-01 LAB — LIPASE, BLOOD: Lipase: 29 U/L (ref 22–51)

## 2015-04-01 MED ORDER — IOHEXOL 300 MG/ML  SOLN
50.0000 mL | Freq: Once | INTRAMUSCULAR | Status: AC | PRN
  Administered 2015-04-01: 50 mL via ORAL

## 2015-04-01 MED ORDER — METRONIDAZOLE IN NACL 5-0.79 MG/ML-% IV SOLN
500.0000 mg | Freq: Once | INTRAVENOUS | Status: AC
  Administered 2015-04-01: 500 mg via INTRAVENOUS
  Filled 2015-04-01: qty 100

## 2015-04-01 MED ORDER — SODIUM CHLORIDE 0.9 % IV BOLUS (SEPSIS)
250.0000 mL | Freq: Once | INTRAVENOUS | Status: AC
  Administered 2015-04-01: 250 mL via INTRAVENOUS

## 2015-04-01 MED ORDER — IOHEXOL 300 MG/ML  SOLN
100.0000 mL | Freq: Once | INTRAMUSCULAR | Status: AC | PRN
  Administered 2015-04-01: 100 mL via INTRAVENOUS

## 2015-04-01 NOTE — ED Notes (Signed)
Per ems pt is from [facility]; per ems pt had nause vomiting and diarrhea X2 days;  Pt reports chills, chronic back pain; presents with a sore on buttocks; pt normal ambulatory with a walker with severe scoliosis; pt is alert and oriented X4; denies chest pain;

## 2015-04-01 NOTE — ED Provider Notes (Signed)
CSN: [id number]     Arrival date & time 04/01/15  1725 History   First MD Initiated Contact with Patient 04/01/15 1803     Chief Complaint  Patient presents with  . Emesis     (Consider location/radiation/quality/duration/timing/severity/associated sxs/prior Treatment) Patient is a 81 y.o. female presenting with vomiting. The history is provided by the patient (the pt has had vomiting and diarrhea for 2 days,  with abd pain).  Emesis Severity:  Moderate Timing:  Intermittent Quality:  Bilious material Able to tolerate:  Solids Progression:  Unchanged Chronicity:  New Recent urination:  Normal Associated symptoms: diarrhea   Associated symptoms: no abdominal pain and no headaches     Past Medical History  Diagnosis Date  . Incontinence of urine   . Diarrhea   . Neuropathy     bilateral feet  . GERD (gastroesophageal reflux disease)   . Depression   . Urinary, incontinence, stress female   . History of shingles 2009  . Degenerative lumbar spinal stenosis   . Type II diabetes mellitus     Type 2 IDDM  X 40 years;   . Arthritis     lower back/hands  . Altered mental status 01/13/2013  . Hypertension   . Pneumonia   . Anginal pain    Past Surgical History  Procedure Laterality Date  . Incontinence surgery  2005  . Tonsillectomy  1964  . Abdominal hysterectomy  1982  . Ovarian cyst surgery  1972  . Cholecystectomy  1988  . Cystoscopy  2009    macroplastique inj  . Rectocele repair  10/15/2011    Procedure: POSTERIOR REPAIR (RECTOCELE);  Surgeon: Kathi Ludwig, MD;  Location: [facility];  Service: Urology;;  posterior vaginal vault repair with sacral spinus repair with graft  . Cystoscopy  10/15/2011    Procedure: CYSTOSCOPY;  Surgeon: Kathi Ludwig, MD;  Location: [facility];  Service: Urology;  Laterality: N/A;  . Flexible sigmoidoscopy  02/28/2012    Procedure: FLEXIBLE SIGMOIDOSCOPY;  Surgeon: Willis Modena, MD;  Location:  [facility];  Service: Endoscopy;  Laterality: N/A;  . Breast biopsy  1990    bilaterally  . Cataract extraction w/ intraocular lens implant    . Posterior fusion lumbar spine  05/09/2012  . Amputation Right 03/30/2014    Procedure: Right Great Toe Amputation at MTP Joint;  Surgeon: Nadara Mustard, MD;  Location[facility];  Service: Orthopedics;  Laterality: Right;  . Amputation N/A 06/03/2014    Procedure: AMPUTATION LEFT GREAT TOE  AND  RIGHT SECOND TOE;  Surgeon: Nadara Mustard, MD;  Location: [facility] OR;  Service: Orthopedics;  Laterality: N/A;  . Amputation Left 07/13/2014    Procedure: Amputation 2nd Toe MTP;  Surgeon: Nadara Mustard, MD;  Location: [facility] OR;  Service: Orthopedics;  Laterality: Left;   Family History  Problem Relation Age of Onset  . Osteoarthritis Sister   . Osteoarthritis Brother    Social History  Substance Use Topics  . Smoking status: Never Smoker   . Smokeless tobacco: Never Used  . Alcohol Use: No   OB History    No data available     Review of Systems  Constitutional: Negative for appetite change and fatigue.  HENT: Negative for congestion, ear discharge and sinus pressure.   Eyes: Negative for discharge.  Respiratory: Negative for cough.   Cardiovascular: Negative for chest pain.  Gastrointestinal: Positive for vomiting and diarrhea. Negative for abdominal pain.  Genitourinary:  Negative for frequency and hematuria.  Musculoskeletal: Negative for back pain.  Skin: Negative for rash.  Neurological: Negative for seizures and headaches.  Psychiatric/Behavioral: Negative for hallucinations.      Allergies  Ciprofloxacin; Lidoderm; Ace inhibitors; Angiotensin receptor blockers; Glucotrol; Triple antibiotic; Neosporin; Sulfa antibiotics; and Zyrtec  Home Medications   Prior to Admission medications   Medication Sig Start Date End Date Taking? Authorizing Provider  acetaminophen (TYLENOL) 325 MG tablet Take 2 tablets (650 mg total) by mouth every 6 (six)  hours as needed for mild pain or fever. 03/20/14  Yes Richarda Overlie, MD  amiloride-hydrochlorothiazide (MODURETIC) 5-50 MG tablet Take 0.5 tablets by mouth daily.   Yes Historical Provider, MD  aspirin EC 81 MG tablet Take 81 mg by mouth every morning.    Yes Historical Provider, MD  calcium citrate-vitamin D (CITRACAL+D) 315-200 MG-UNIT per tablet Take 2 tablets by mouth every morning.    Yes Historical Provider, MD  docusate sodium 100 MG CAPS Take 100 mg by mouth 2 (two) times daily. 06/06/14  Yes Penny Pia, MD  DULoxetine (CYMBALTA) 30 MG capsule Take 30 mg by mouth every morning.    Yes Historical Provider, MD  fexofenadine (ALLEGRA) 180 MG tablet Take 180 mg by mouth every morning.    Yes Historical Provider, MD  GLUCERNA (GLUCERNA) LIQD Take 237 mLs by mouth 2 (two) times daily. Vanilla only   Yes Historical Provider, MD  HYDROcodone-acetaminophen (NORCO/VICODIN) 5-325 MG per tablet Take 1 tablet by mouth every 6 (six) hours as needed. Patient taking differently: Take 1 tablet by mouth every 6 (six) hours as needed (pain.).  07/03/14  Yes Roxy Horseman, PA-C  insulin glargine (LANTUS) 100 UNIT/ML injection Inject 0.1 mLs (10 Units total) into the skin at bedtime. Patient taking differently: Inject 16 Units into the skin at bedtime.  06/06/14  Yes Penny Pia, MD  insulin lispro (HUMALOG) 100 UNIT/ML injection Inject 7 Units into the skin 3 (three) times daily before meals.    Yes Historical Provider, MD  iron polysaccharides (NIFEREX) 150 MG capsule Take 150 mg by mouth daily.    Yes Historical Provider, MD  LORazepam (ATIVAN) 0.5 MG tablet Take 1 tablet (0.5 mg total) by mouth every 12 (twelve) hours. For anxiety. 03/20/14  Yes Richarda Overlie, MD  methylcellulose (ARTIFICIAL TEARS) 1 % ophthalmic solution Place 1 drop into both eyes daily as needed (for dry eyes).   Yes Historical Provider, MD  morphine (MS CONTIN) 15 MG 12 hr tablet Take 1 tablet (15 mg total) by mouth every 12 (twelve)  hours. 06/06/14  Yes Penny Pia, MD  mupirocin ointment (BACTROBAN) 2 % Apply 1 application topically 2 (two) times daily. To affected area (unspecified).   Yes Historical Provider, MD  olopatadine (PATANOL) 0.1 % ophthalmic solution Place 1 drop into both eyes 2 (two) times daily as needed (itching/watery eyes). 03/20/14  Yes Richarda Overlie, MD  omeprazole (PRILOSEC) 20 MG capsule Take 20 mg by mouth every morning.    Yes Historical Provider, MD  oxyCODONE-acetaminophen (PERCOCET/ROXICET) 5-325 MG per tablet Take 1 tablet by mouth every 4 (four) hours as needed (breakthrough severe pain after administration of MS contin). 06/06/14  Yes Penny Pia, MD  polyethylene glycol (MIRALAX / GLYCOLAX) packet Take 17 g by mouth daily as needed for mild constipation.   Yes Historical Provider, MD  potassium chloride SA (K-DUR,KLOR-CON) 20 MEQ tablet Take 20 mEq by mouth every morning.    Yes Historical Provider, MD  promethazine (PHENERGAN) 12.5  MG tablet Take 12.5 mg by mouth every 6 (six) hours as needed for nausea or vomiting.   Yes Historical Provider, MD  senna-docusate (SENNA-S) 8.6-50 MG per tablet Take 2 tablets by mouth at bedtime.   Yes Historical Provider, MD  cephALEXin (KEFLEX) 500 MG capsule Take 1 capsule (500 mg total) by mouth 3 (three) times daily. Patient not taking: Reported on 11/20/2014 07/03/14   Roxy Horseman, PA-C  doxycycline (VIBRA-TABS) 100 MG tablet Take 1 tablet (100 mg total) by mouth every 12 (twelve) hours. Patient not taking: Reported on 11/20/2014 06/06/14   Penny Pia, MD   BP 179/82 mmHg  Pulse 99  Temp(Src) 98.4 F (36.9 C) (Oral)  Resp 22  SpO2 98% Physical Exam  Constitutional: She is oriented to person, place, and time. She appears well-developed.  HENT:  Head: Normocephalic.  Eyes: Conjunctivae and EOM are normal. No scleral icterus.  Neck: Neck supple. No thyromegaly present.  Cardiovascular: Normal rate and regular rhythm.  Exam reveals no gallop and no  friction rub.   No murmur heard. Pulmonary/Chest: No stridor. She has no wheezes. She has no rales. She exhibits no tenderness.  Abdominal: She exhibits no distension. There is tenderness. There is no rebound.  Musculoskeletal: Normal range of motion. She exhibits no edema.  Lymphadenopathy:    She has no cervical adenopathy.  Neurological: She is oriented to person, place, and time. She exhibits normal muscle tone. Coordination normal.  Skin: No rash noted. No erythema.  Psychiatric: She has a normal mood and affect. Her behavior is normal.    ED Course  Procedures (including critical care time) Labs Review Labs Reviewed  COMPREHENSIVE METABOLIC PANEL - Abnormal; Notable for the following:    Glucose, Bld 190 (*)    BUN 23 (*)    All other components within normal limits  URINALYSIS, ROUTINE W REFLEX MICROSCOPIC (NOT AT [facility]) - Abnormal; Notable for the following:    Glucose, UA 100 (*)    All other components within normal limits  CBG MONITORING, ED - Abnormal; Notable for the following:    Glucose-Capillary 196 (*)    All other components within normal limits  CBC WITH DIFFERENTIAL/PLATELET  LIPASE, BLOOD    Imaging Review Dg Chest 2 View  04/01/2015   CLINICAL DATA:  Nausea, vomiting, and diarrhea for 2 days. Chills. Chronic back pain. Sore on the buttocks.  EXAM: CHEST  2 VIEW  COMPARISON:  03/30/2014  FINDINGS: Shallow inspiration with mild elevation of the right hemidiaphragm. Linear atelectasis in the right lung base. Normal heart size and pulmonary vascularity. No focal airspace disease or consolidation in the lungs. No blunting of costophrenic angles. No pneumothorax. Mediastinal contours appear intact. Degenerative changes in the spine. Surgical clips in the upper abdomen.  IMPRESSION: No active cardiopulmonary disease.   Electronically Signed   By: Burman Nieves M.D.   On: 04/01/2015 18:41   Ct Abdomen Pelvis W Contrast  04/01/2015   CLINICAL DATA:  Nausea, vomiting,  diarrhea for 2 days, chills  EXAM: CT ABDOMEN AND PELVIS WITH CONTRAST  TECHNIQUE: Multidetector CT imaging of the abdomen and pelvis was performed using the standard protocol following bolus administration of intravenous contrast.  CONTRAST:  50mL OMNIPAQUE IOHEXOL 300 MG/ML SOLN, OMNIPAQUE IOHEXOL 300 MG/ML SOLN  COMPARISON:  12/20/2012 and 11/20/2014  FINDINGS: Again noted compression fracture of T12 vertebral body. Stable L1 and L2 significant chronic compression fractures. Stable postsurgical fusion lower lumbar spine. Extensive atherosclerotic calcifications of abdominal aorta, SMA  and bilateral iliac arteries.  Status postcholecystectomy. Mild intrahepatic biliary ductal dilatation. No focal hepatic mass. The pancreas is atrophic the spleen and adrenal glands are unremarkable. No aortic aneurysm. Kidneys are symmetrical in size and enhancement. No hydronephrosis or hydroureter. Cyst in midpole of the left kidney measures 1.9 cm.  There is no small bowel obstruction. No ascites or free air. No adenopathy. No pericecal inflammation. The patient is status post hysterectomy. There is high density material anterior inferior aspect of the urinary bladder which may be postsurgical in nature.  There is thickening of distal rectal/ anal wall please see axial image 80. Clinical correlation is necessary. Focal inflammation or proctitis cannot be excluded. Moderate stool noted in right colon. Delayed renal images shows bilateral renal symmetrical excretion. Bilateral visualized proximal ureter is unremarkable.  IMPRESSION: 1. Stable postsurgical changes and chronic compression fractures lower thoracic and lumbar spine. 2. No acute inflammatory process within abdomen. 3. Extensive atherosclerotic vascular calcifications. No hydronephrosis or hydroureter. 4. There is thickening of distal rectal/anal wall see axial image 79. Proctitis cannot be excluded. Clinical correlation is necessary. Probable postsurgical changes  anterior perineum inferior aspect of bladder.   Electronically Signed   By: Natasha Mead M.D.   On: 04/01/2015 20:26   I have personally reviewed and evaluated these images and lab results as part of my medical decision-making.   EKG Interpretation None      MDM   Final diagnoses:  Proctitis    Proctitis,  With vomiting,  Will admit tx with iv antibiotics    Bethann Berkshire, MD 04/01/15 2231

## 2015-04-01 NOTE — ED Notes (Signed)
Bed: WA01 Expected date:  Expected time:  Means of arrival:  Comments: EMS/14M

## 2015-04-02 ENCOUNTER — Encounter (HOSPITAL_COMMUNITY): Payer: Self-pay | Admitting: Internal Medicine

## 2015-04-02 DIAGNOSIS — K6289 Other specified diseases of anus and rectum: Principal | ICD-10-CM

## 2015-04-02 DIAGNOSIS — I10 Essential (primary) hypertension: Secondary | ICD-10-CM

## 2015-04-02 DIAGNOSIS — R197 Diarrhea, unspecified: Secondary | ICD-10-CM

## 2015-04-02 DIAGNOSIS — E119 Type 2 diabetes mellitus without complications: Secondary | ICD-10-CM

## 2015-04-02 DIAGNOSIS — R112 Nausea with vomiting, unspecified: Secondary | ICD-10-CM | POA: Diagnosis present

## 2015-04-02 DIAGNOSIS — L8990 Pressure ulcer of unspecified site, unspecified stage: Secondary | ICD-10-CM | POA: Insufficient documentation

## 2015-04-02 LAB — CBC WITH DIFFERENTIAL/PLATELET
BASOS PCT: 0 % (ref 0–1)
Basophils Absolute: 0 10*3/uL (ref 0.0–0.1)
EOS ABS: 0.1 10*3/uL (ref 0.0–0.7)
EOS PCT: 1 % (ref 0–5)
HEMATOCRIT: 40.2 % (ref 36.0–46.0)
Hemoglobin: 13.7 g/dL (ref 12.0–15.0)
Lymphocytes Relative: 19 % (ref 12–46)
Lymphs Abs: 2.1 10*3/uL (ref 0.7–4.0)
MCH: 29.5 pg (ref 26.0–34.0)
MCHC: 34.1 g/dL (ref 30.0–36.0)
MCV: 86.5 fL (ref 78.0–100.0)
MONO ABS: 0.8 10*3/uL (ref 0.1–1.0)
MONOS PCT: 8 % (ref 3–12)
NEUTROS ABS: 7.9 10*3/uL — AB (ref 1.7–7.7)
Neutrophils Relative %: 72 % (ref 43–77)
Platelets: 251 10*3/uL (ref 150–400)
RBC: 4.65 MIL/uL (ref 3.87–5.11)
RDW: 13 % (ref 11.5–15.5)
WBC: 10.9 10*3/uL — ABNORMAL HIGH (ref 4.0–10.5)

## 2015-04-02 LAB — COMPREHENSIVE METABOLIC PANEL
ALBUMIN: 3.9 g/dL (ref 3.5–5.0)
ALK PHOS: 74 U/L (ref 38–126)
ALT: 16 U/L (ref 14–54)
AST: 20 U/L (ref 15–41)
Anion gap: 9 (ref 5–15)
BILIRUBIN TOTAL: 0.5 mg/dL (ref 0.3–1.2)
BUN: 16 mg/dL (ref 6–20)
CALCIUM: 8.6 mg/dL — AB (ref 8.9–10.3)
CO2: 27 mmol/L (ref 22–32)
CREATININE: 0.6 mg/dL (ref 0.44–1.00)
Chloride: 99 mmol/L — ABNORMAL LOW (ref 101–111)
GFR calc Af Amer: >60 mL/min (ref >60)
GFR calc non Af Amer: >60 mL/min (ref >60)
GLUCOSE: 236 mg/dL — AB (ref 65–99)
Potassium: 3.2 mmol/L — ABNORMAL LOW (ref 3.5–5.1)
SODIUM: 135 mmol/L (ref 135–145)
Total Protein: 6.6 g/dL (ref 6.5–8.1)

## 2015-04-02 LAB — GLUCOSE, CAPILLARY
GLUCOSE-CAPILLARY: 219 mg/dL — AB (ref 65–99)
GLUCOSE-CAPILLARY: 258 mg/dL — AB (ref 65–99)
GLUCOSE-CAPILLARY: 144 mg/dL — AB (ref 65–99)
Glucose-Capillary: 218 mg/dL — ABNORMAL HIGH (ref 65–99)
Glucose-Capillary: 216 mg/dL — ABNORMAL HIGH (ref 65–99)

## 2015-04-02 LAB — SURGICAL PCR SCREEN
MRSA, PCR: NEGATIVE
Staphylococcus aureus: NEGATIVE

## 2015-04-02 MED ORDER — METRONIDAZOLE IN NACL 5-0.79 MG/ML-% IV SOLN
500.0000 mg | Freq: Three times a day (TID) | INTRAVENOUS | Status: DC
  Administered 2015-04-02 – 2015-04-03 (×4): 500 mg via INTRAVENOUS
  Filled 2015-04-02 (×6): qty 100

## 2015-04-02 MED ORDER — POTASSIUM CHLORIDE CRYS ER 20 MEQ PO TBCR: 40.0000 meq | EXTENDED_RELEASE_TABLET | Freq: Once | ORAL | Status: DC

## 2015-04-02 MED ORDER — MORPHINE SULFATE ER 15 MG PO TBCR
15.0000 mg | EXTENDED_RELEASE_TABLET | Freq: Two times a day (BID) | ORAL | Status: DC
  Administered 2015-04-02 – 2015-04-03 (×4): 15 mg via ORAL
  Filled 2015-04-02 (×4): qty 1

## 2015-04-02 MED ORDER — GLUCERNA 1.2 CAL PO LIQD
237.0000 mL | Freq: Two times a day (BID) | ORAL | Status: DC
  Filled 2015-04-02: qty 237

## 2015-04-02 MED ORDER — ACETAMINOPHEN 650 MG RE SUPP: 650.0000 mg | Freq: Four times a day (QID) | RECTAL | Status: DC | PRN

## 2015-04-02 MED ORDER — ACETAMINOPHEN 325 MG PO TABS: 650.0000 mg | ORAL_TABLET | Freq: Four times a day (QID) | ORAL | Status: DC | PRN

## 2015-04-02 MED ORDER — OLOPATADINE HCL 0.1 % OP SOLN
1.0000 [drp] | Freq: Two times a day (BID) | OPHTHALMIC | Status: DC | PRN
  Filled 2015-04-02: qty 5

## 2015-04-02 MED ORDER — ENOXAPARIN SODIUM 40 MG/0.4ML ~~LOC~~ SOLN
40.0000 mg | SUBCUTANEOUS | Status: DC
  Administered 2015-04-02 – 2015-04-03 (×2): 40 mg via SUBCUTANEOUS
  Filled 2015-04-02 (×2): qty 0.4

## 2015-04-02 MED ORDER — DEXTROSE 5 % IV SOLN
2.0000 g | Freq: Every day | INTRAVENOUS | Status: DC
  Administered 2015-04-02 (×2): 2 g via INTRAVENOUS
  Filled 2015-04-02 (×3): qty 2

## 2015-04-02 MED ORDER — INSULIN GLARGINE 100 UNIT/ML ~~LOC~~ SOLN
16.0000 [IU] | Freq: Every day | SUBCUTANEOUS | Status: DC
  Administered 2015-04-02 (×2): 16 [IU] via SUBCUTANEOUS
  Filled 2015-04-02 (×3): qty 0.16

## 2015-04-02 MED ORDER — GLUCERNA SHAKE PO LIQD
237.0000 mL | Freq: Three times a day (TID) | ORAL | Status: DC
  Administered 2015-04-02 – 2015-04-03 (×3): 237 mL via ORAL
  Filled 2015-04-02 (×5): qty 237

## 2015-04-02 MED ORDER — PANTOPRAZOLE SODIUM 40 MG PO TBEC
40.0000 mg | DELAYED_RELEASE_TABLET | Freq: Every day | ORAL | Status: DC
  Administered 2015-04-02 – 2015-04-03 (×2): 40 mg via ORAL
  Filled 2015-04-02 (×3): qty 1

## 2015-04-02 MED ORDER — DULOXETINE HCL 30 MG PO CPEP
30.0000 mg | ORAL_CAPSULE | Freq: Every morning | ORAL | Status: DC
  Administered 2015-04-02 – 2015-04-03 (×2): 30 mg via ORAL
  Filled 2015-04-02 (×2): qty 1

## 2015-04-02 MED ORDER — LORATADINE 10 MG PO TABS
10.0000 mg | ORAL_TABLET | Freq: Every day | ORAL | Status: DC
  Administered 2015-04-02 – 2015-04-03 (×2): 10 mg via ORAL
  Filled 2015-04-02 (×2): qty 1

## 2015-04-02 MED ORDER — ONDANSETRON HCL 4 MG/2ML IJ SOLN: 4.0000 mg | Freq: Four times a day (QID) | INTRAMUSCULAR | Status: DC | PRN

## 2015-04-02 MED ORDER — ASPIRIN EC 81 MG PO TBEC
81.0000 mg | DELAYED_RELEASE_TABLET | Freq: Every morning | ORAL | Status: DC
  Administered 2015-04-02 – 2015-04-03 (×2): 81 mg via ORAL
  Filled 2015-04-02 (×2): qty 1

## 2015-04-02 MED ORDER — ONDANSETRON HCL 4 MG PO TABS: 4.0000 mg | ORAL_TABLET | Freq: Four times a day (QID) | ORAL | Status: DC | PRN

## 2015-04-02 MED ORDER — LORAZEPAM 0.5 MG PO TABS
0.5000 mg | ORAL_TABLET | Freq: Two times a day (BID) | ORAL | Status: DC
  Administered 2015-04-02 – 2015-04-03 (×4): 0.5 mg via ORAL
  Filled 2015-04-02 (×4): qty 1

## 2015-04-02 MED ORDER — POLYVINYL ALCOHOL 1.4 % OP SOLN
1.0000 [drp] | Freq: Every day | OPHTHALMIC | Status: DC | PRN
  Filled 2015-04-02: qty 15

## 2015-04-02 MED ORDER — SODIUM CHLORIDE 0.9 % IV SOLN
INTRAVENOUS | Status: DC
  Administered 2015-04-02: 11:00:00 via INTRAVENOUS
  Administered 2015-04-02: 100 mL/h via INTRAVENOUS

## 2015-04-02 MED ORDER — POTASSIUM CHLORIDE CRYS ER 20 MEQ PO TBCR
20.0000 meq | EXTENDED_RELEASE_TABLET | Freq: Every morning | ORAL | Status: DC
  Administered 2015-04-02: 20 meq via ORAL
  Filled 2015-04-02 (×2): qty 1

## 2015-04-02 MED ORDER — POLYSACCHARIDE IRON COMPLEX 150 MG PO CAPS
150.0000 mg | ORAL_CAPSULE | Freq: Every day | ORAL | Status: DC
  Administered 2015-04-02 – 2015-04-03 (×2): 150 mg via ORAL
  Filled 2015-04-02 (×2): qty 1

## 2015-04-02 MED ORDER — HYDRALAZINE HCL 20 MG/ML IJ SOLN
10.0000 mg | INTRAMUSCULAR | Status: DC | PRN
  Administered 2015-04-02: 10 mg via INTRAVENOUS
  Filled 2015-04-02: qty 1

## 2015-04-02 MED ORDER — HYDROCODONE-ACETAMINOPHEN 5-325 MG PO TABS
1.0000 | ORAL_TABLET | Freq: Four times a day (QID) | ORAL | Status: DC | PRN
  Administered 2015-04-02: 1 via ORAL
  Filled 2015-04-02: qty 1

## 2015-04-02 MED ORDER — INSULIN ASPART 100 UNIT/ML ~~LOC~~ SOLN
0.0000 [IU] | Freq: Three times a day (TID) | SUBCUTANEOUS | Status: DC
  Administered 2015-04-02: 5 [IU] via SUBCUTANEOUS
  Administered 2015-04-02 (×2): 3 [IU] via SUBCUTANEOUS
  Administered 2015-04-03: 2 [IU] via SUBCUTANEOUS

## 2015-04-02 MED ORDER — PROMETHAZINE HCL 25 MG PO TABS: 12.5000 mg | ORAL_TABLET | Freq: Four times a day (QID) | ORAL | Status: DC | PRN

## 2015-04-02 NOTE — Progress Notes (Signed)
Pt admitted from [facility] [place]. CSW will follow to assist with d/c planning.  Cori Razor LCSW [phone]

## 2015-04-02 NOTE — Care Management Note (Signed)
Case Management Note  Patient Details  Name: [name] MRN: [id number] Date of Birth: 1933/04/02  Subjective/Objective:                 Admitted with nausea, vomiting, and diarhea   Action/Plan: Discharge planning per CSW, plan to return to SNF when stable   Expected Discharge Date:  04/04/15               Expected Discharge Plan:  Skilled Nursing Facility  In-House Referral:  Clinical Social Work  Discharge planning Services  CM Consult  Post Acute Care Choice:  NA Choice offered to:  NA  DME Arranged:  N/A DME Agency:  NA  HH Arranged:  NA HH Agency:  NA  Status of Service:  Completed, signed off  Medicare Important Message Given:    Date Medicare IM Given:    Medicare IM give by:    Date Additional Medicare IM Given:    Additional Medicare Important Message give by:     If discussed at Long Length of Stay Meetings, dates discussed:    Additional Comments:  Alexis Goodell, RN 04/02/2015, 11:13 AM

## 2015-04-02 NOTE — H&P (Signed)
[facility] History and Physical  [name] EXB:[id number] DOB: 1933-07-26 DOA: 04/01/2015  Referring physician: Dr. Estell Harpin. PCP: Lillia Mountain, MD  Specialists: None.  Chief Complaint: Diarrhea and nausea vomiting.  HPI: [name] is a 81 y.o. female with history of diabetes mellitus type 2, hypertension presents to the ER because of multiple episodes of diarrhea yesterday. A day previously patient also had nausea and vomiting. Patient has pain around the perirectal area. Denies any sick contacts or recent use of antibiotics. CT abdomen and pelvis shows features concerning for proctitis. Patient has been admitted for further management. Patient denies any chest pain shortness of breath fever chills. Denies any blood in the diarrhea or vomitus.   Review of Systems: As presented in the history of presenting illness, rest negative.  Past Medical History  Diagnosis Date  . Incontinence of urine   . Diarrhea   . Neuropathy     bilateral feet  . GERD (gastroesophageal reflux disease)   . Depression   . Urinary, incontinence, stress female   . History of shingles 2009  . Degenerative lumbar spinal stenosis   . Type II diabetes mellitus     Type 2 IDDM  X 40 years;   . Arthritis     lower back/hands  . Altered mental status 01/13/2013  . Hypertension   . Pneumonia   . Anginal pain    Past Surgical History  Procedure Laterality Date  . Incontinence surgery  2005  . Tonsillectomy  1964  . Abdominal hysterectomy  1982  . Ovarian cyst surgery  1972  . Cholecystectomy  1988  . Cystoscopy  2009    macroplastique inj  . Rectocele repair  10/15/2011    Procedure: POSTERIOR REPAIR (RECTOCELE);  Surgeon: Kathi Ludwig, MD;  Location: [facility];  Service: Urology;;  posterior vaginal vault repair with sacral spinus repair with graft  . Cystoscopy  10/15/2011    Procedure: CYSTOSCOPY;  Surgeon: Kathi Ludwig, MD;  Location: [facility];  Service: Urology;  Laterality: N/A;  . Flexible sigmoidoscopy  02/28/2012    Procedure: FLEXIBLE SIGMOIDOSCOPY;  Surgeon: Willis Modena, MD;  Location: [facility];  Service: Endoscopy;  Laterality: N/A;  . Breast biopsy  1990    bilaterally  . Cataract extraction w/ intraocular lens implant    . Posterior fusion lumbar spine  05/09/2012  . Amputation Right 03/30/2014    Procedure: Right Great Toe Amputation at MTP Joint;  Surgeon: Nadara Mustard, MD;  Location: [facility] OR;  Service: Orthopedics;  Laterality: Right;  . Amputation N/A 06/03/2014    Procedure: AMPUTATION LEFT GREAT TOE  AND  RIGHT SECOND TOE;  Surgeon: Nadara Mustard, MD;  Location: [facility] OR;  Service: Orthopedics;  Laterality: N/A;  . Amputation Left 07/13/2014    Procedure: Amputation 2nd Toe MTP;  Surgeon: Nadara Mustard, MD;  Location: [facility] OR;  Service: Orthopedics;  Laterality: Left;   Social History:  reports that she has never smoked. She has never used smokeless tobacco. She reports that she does not drink alcohol or use illicit drugs. Where does patient live nursing home. Can patient participate in ADLs? Yes.  Allergies  Allergen Reactions  . Ciprofloxacin Other (See Comments)    Leg and feet numbness  . Lidoderm [Lidocaine] Itching, Rash and Other (See Comments)    Burning sensations also  . Ace Inhibitors Other (See Comments)    Poorly tolerated-per PCP  . Angiotensin Receptor Blockers Other (  See Comments)    Poorly tolerated-per PCP  . Glucotrol [Glipizide] Nausea Only and Other (See Comments)    Headaches also  . Triple Antibiotic [Bacitracin-Neomycin-Polymyxin] Other (See Comments)    Poorly tolerated  . Neosporin [Neomycin-Bacitracin Zn-Polymyx] Rash  . Sulfa Antibiotics Rash  . Zyrtec [Cetirizine] Itching and Rash    Family History:  Family History  Problem Relation Age of Onset  . Osteoarthritis Sister   . Osteoarthritis Brother       Prior to Admission medications   Medication Sig Start  Date End Date Taking? Authorizing Provider  acetaminophen (TYLENOL) 325 MG tablet Take 2 tablets (650 mg total) by mouth every 6 (six) hours as needed for mild pain or fever. 03/20/14  Yes Richarda Overlie, MD  amiloride-hydrochlorothiazide (MODURETIC) 5-50 MG tablet Take 0.5 tablets by mouth daily.   Yes Historical Provider, MD  aspirin EC 81 MG tablet Take 81 mg by mouth every morning.    Yes Historical Provider, MD  calcium citrate-vitamin D (CITRACAL+D) 315-200 MG-UNIT per tablet Take 2 tablets by mouth every morning.    Yes Historical Provider, MD  docusate sodium 100 MG CAPS Take 100 mg by mouth 2 (two) times daily. 06/06/14  Yes Penny Pia, MD  DULoxetine (CYMBALTA) 30 MG capsule Take 30 mg by mouth every morning.    Yes Historical Provider, MD  fexofenadine (ALLEGRA) 180 MG tablet Take 180 mg by mouth every morning.    Yes Historical Provider, MD  GLUCERNA (GLUCERNA) LIQD Take 237 mLs by mouth 2 (two) times daily. Vanilla only   Yes Historical Provider, MD  HYDROcodone-acetaminophen (NORCO/VICODIN) 5-325 MG per tablet Take 1 tablet by mouth every 6 (six) hours as needed. Patient taking differently: Take 1 tablet by mouth every 6 (six) hours as needed (pain.).  07/03/14  Yes Roxy Horseman, PA-C  insulin glargine (LANTUS) 100 UNIT/ML injection Inject 0.1 mLs (10 Units total) into the skin at bedtime. Patient taking differently: Inject 16 Units into the skin at bedtime.  06/06/14  Yes Penny Pia, MD  insulin lispro (HUMALOG) 100 UNIT/ML injection Inject 7 Units into the skin 3 (three) times daily before meals.    Yes Historical Provider, MD  iron polysaccharides (NIFEREX) 150 MG capsule Take 150 mg by mouth daily.    Yes Historical Provider, MD  LORazepam (ATIVAN) 0.5 MG tablet Take 1 tablet (0.5 mg total) by mouth every 12 (twelve) hours. For anxiety. 03/20/14  Yes Richarda Overlie, MD  methylcellulose (ARTIFICIAL TEARS) 1 % ophthalmic solution Place 1 drop into both eyes daily as needed (for dry  eyes).   Yes Historical Provider, MD  morphine (MS CONTIN) 15 MG 12 hr tablet Take 1 tablet (15 mg total) by mouth every 12 (twelve) hours. 06/06/14  Yes Penny Pia, MD  mupirocin ointment (BACTROBAN) 2 % Apply 1 application topically 2 (two) times daily. To affected area (unspecified).   Yes Historical Provider, MD  olopatadine (PATANOL) 0.1 % ophthalmic solution Place 1 drop into both eyes 2 (two) times daily as needed (itching/watery eyes). 03/20/14  Yes Richarda Overlie, MD  omeprazole (PRILOSEC) 20 MG capsule Take 20 mg by mouth every morning.    Yes Historical Provider, MD  oxyCODONE-acetaminophen (PERCOCET/ROXICET) 5-325 MG per tablet Take 1 tablet by mouth every 4 (four) hours as needed (breakthrough severe pain after administration of MS contin). 06/06/14  Yes Penny Pia, MD  polyethylene glycol (MIRALAX / GLYCOLAX) packet Take 17 g by mouth daily as needed for mild constipation.   Yes Historical  Provider, MD  potassium chloride SA (K-DUR,KLOR-CON) 20 MEQ tablet Take 20 mEq by mouth every morning.    Yes Historical Provider, MD  promethazine (PHENERGAN) 12.5 MG tablet Take 12.5 mg by mouth every 6 (six) hours as needed for nausea or vomiting.   Yes Historical Provider, MD  senna-docusate (SENNA-S) 8.6-50 MG per tablet Take 2 tablets by mouth at bedtime.   Yes Historical Provider, MD  cephALEXin (KEFLEX) 500 MG capsule Take 1 capsule (500 mg total) by mouth 3 (three) times daily. Patient not taking: Reported on 11/20/2014 07/03/14   Roxy Horseman, PA-C  doxycycline (VIBRA-TABS) 100 MG tablet Take 1 tablet (100 mg total) by mouth every 12 (twelve) hours. Patient not taking: Reported on 11/20/2014 06/06/14   Penny Pia, MD    Physical Exam: Filed Vitals:   04/01/15 1740 04/01/15 1800 04/01/15 2211 04/01/15 2312  BP: 178/101 179/92 179/82 168/90  Pulse: 102 107 99 103  Temp: 98.4 F (36.9 C)   98.4 F (36.9 C)  TempSrc: Oral   Oral  Resp: 18 15 22    SpO2: 97% 98% 98% 97%      General:  Moderately built and nourished.  Eyes: Anicteric no pallor.  ENT: No discharge from the ears eyes nose and mouth.  Neck: No mass felt. No JVD appreciated.  Cardiovascular: S1-S2 heard.  Respiratory: No rhonchi or crepitations.  Abdomen: Soft nontender bowel sounds present.  Skin: Mild skin erythema around the perirectal area.  Musculoskeletal: No edema.  Psychiatric: Appears normal.  Neurologic: Alert and awake oriented to time place and person. Moves all extremities.  Labs on Admission:  Basic Metabolic Panel:  Recent Labs Lab 04/01/15 1823  NA 139  K 3.6  CL 102  CO2 30  GLUCOSE 190*  BUN 23*  CREATININE 0.70  CALCIUM 9.6   Liver Function Tests:  Recent Labs Lab 04/01/15 1823  AST 24  ALT 16  ALKPHOS 74  BILITOT 0.7  PROT 6.9  ALBUMIN 4.1    Recent Labs Lab 04/01/15 1823  LIPASE 29   No results for input(s): AMMONIA in the last 168 hours. CBC:  Recent Labs Lab 04/01/15 1823  WBC 8.0  NEUTROABS 5.3  HGB 13.0  HCT 39.7  MCV 86.5  PLT 241   Cardiac Enzymes: No results for input(s): CKTOTAL, CKMB, CKMBINDEX, TROPONINI in the last 168 hours.  BNP (last 3 results) No results for input(s): BNP in the last 8760 hours.  ProBNP (last 3 results) No results for input(s): PROBNP in the last 8760 hours.  CBG:  Recent Labs Lab 04/01/15 1934  GLUCAP 196*    Radiological Exams on Admission: Dg Chest 2 View  04/01/2015   CLINICAL DATA:  Nausea, vomiting, and diarrhea for 2 days. Chills. Chronic back pain. Sore on the buttocks.  EXAM: CHEST  2 VIEW  COMPARISON:  03/30/2014  FINDINGS: Shallow inspiration with mild elevation of the right hemidiaphragm. Linear atelectasis in the right lung base. Normal heart size and pulmonary vascularity. No focal airspace disease or consolidation in the lungs. No blunting of costophrenic angles. No pneumothorax. Mediastinal contours appear intact. Degenerative changes in the spine. Surgical clips  in the upper abdomen.  IMPRESSION: No active cardiopulmonary disease.   Electronically Signed   By: Burman Nieves M.D.   On: 04/01/2015 18:41   Ct Abdomen Pelvis W Contrast  04/01/2015   CLINICAL DATA:  Nausea, vomiting, diarrhea for 2 days, chills  EXAM: CT ABDOMEN AND PELVIS WITH CONTRAST  TECHNIQUE: Multidetector CT  imaging of the abdomen and pelvis was performed using the standard protocol following bolus administration of intravenous contrast.  CONTRAST:  50mL OMNIPAQUE IOHEXOL 300 MG/ML SOLN, OMNIPAQUE IOHEXOL 300 MG/ML SOLN  COMPARISON:  12/20/2012 and 11/20/2014  FINDINGS: Again noted compression fracture of T12 vertebral body. Stable L1 and L2 significant chronic compression fractures. Stable postsurgical fusion lower lumbar spine. Extensive atherosclerotic calcifications of abdominal aorta, SMA and bilateral iliac arteries.  Status postcholecystectomy. Mild intrahepatic biliary ductal dilatation. No focal hepatic mass. The pancreas is atrophic the spleen and adrenal glands are unremarkable. No aortic aneurysm. Kidneys are symmetrical in size and enhancement. No hydronephrosis or hydroureter. Cyst in midpole of the left kidney measures 1.9 cm.  There is no small bowel obstruction. No ascites or free air. No adenopathy. No pericecal inflammation. The patient is status post hysterectomy. There is high density material anterior inferior aspect of the urinary bladder which may be postsurgical in nature.  There is thickening of distal rectal/ anal wall please see axial image 80. Clinical correlation is necessary. Focal inflammation or proctitis cannot be excluded. Moderate stool noted in right colon. Delayed renal images shows bilateral renal symmetrical excretion. Bilateral visualized proximal ureter is unremarkable.  IMPRESSION: 1. Stable postsurgical changes and chronic compression fractures lower thoracic and lumbar spine. 2. No acute inflammatory process within abdomen. 3. Extensive  atherosclerotic vascular calcifications. No hydronephrosis or hydroureter. 4. There is thickening of distal rectal/anal wall see axial image 79. Proctitis cannot be excluded. Clinical correlation is necessary. Probable postsurgical changes anterior perineum inferior aspect of bladder.   Electronically Signed   By: Natasha Mead M.D.   On: 04/01/2015 20:26     Assessment/Plan Principal Problem:   Nausea vomiting and diarrhea Active Problems:   Proctitis   Diabetes mellitus type 2, controlled   Essential hypertension   Nausea, vomiting and diarrhea   1. Diarrhea with nausea vomiting and proctitis - patient has been placed on Flagyl and ceftriaxone (patient is allergic to Cipro). Check stool studies. Continue with hydration. 2. There is mellitus type II controlled on insulin - I have continued patient on home dose of insulin with sliding scale coverage. 3. Hypertension - since patient is receiving IV fluids at this time I have held patient's Diuretics and gently hydrating. 4. History of chronic pain - continue present medications.  I have reviewed patient's old charts and labs. Personally reviewed patient's chest x-ray.   DVT Prophylaxis Lovenox.  Code Status: DO NOT RESUSCITATE as requested by patient.  Family Communication: Discussed with patient.  Disposition Plan: Admit to inpatient.    Indiyah Paone N. [facility] Pager [phone].  If 7PM-7AM, please contact night-coverage [email or web address] Password [facility] 04/02/2015, 12:56 AM

## 2015-04-02 NOTE — Progress Notes (Signed)
[facility] PROGRESS NOTE  [name] UJW:[id number] DOB: [birth year] DOA: 04/01/2015 PCP: Lillia Mountain, MD  Assessment/Plan:  Diarrhea with nausea vomiting and proctitis - Continue with  Flagyl and ceftriaxone (patient is allergic to Cipro). - stool studies pending. . Continue with hydration.  Diabetes mellitus type II controlled on insulin - Continue with lantus and SSI.   Hypertension - hold diuretics.   History of chronic pain - continue present medications.  Code Status: DNR Family Communication:care discussed with husband  Disposition Plan: remain inpatient.    Consultants:  none  Procedures:  none  Antibiotics:  Ceftriaxone  8-22  Flagyl 8-22  HPI/Subjective: Denies abdominal pain. Feels confuse.    Objective: Filed Vitals:   04/02/15 0543  BP: 159/72  Pulse: 102  Temp: 98.6 F (37 C)  Resp: 16    Intake/Output Summary (Last 24 hours) at 04/02/15 1009 Last data filed at 04/02/15 0730  Gross per 24 hour  Intake    600 ml  Output      0 ml  Net    600 ml   Filed Weights   04/02/15 0206  Weight: 57.7 kg (127 lb 3.3 oz)    Exam:   General:  NAD  Cardiovascular: S 1, S 2 RRR  Respiratory: CTA  Abdomen: BS present, soft, nt  Musculoskeletal: no edema  Data Reviewed: Basic Metabolic Panel:  Recent Labs Lab 04/01/15 1823 04/02/15 0429  NA 139 135  K 3.6 3.2*  CL 102 99*  CO2 30 27  GLUCOSE 190* 236*  BUN 23* 16  CREATININE 0.70 0.60  CALCIUM 9.6 8.6*   Liver Function Tests:  Recent Labs Lab 04/01/15 1823 04/02/15 0429  AST 24 20  ALT 16 16  ALKPHOS 74 74  BILITOT 0.7 0.5  PROT 6.9 6.6  ALBUMIN 4.1 3.9    Recent Labs Lab 04/01/15 1823  LIPASE 29   No results for input(s): AMMONIA in the last 168 hours. CBC:  Recent Labs Lab 04/01/15 1823 04/02/15 0429  WBC 8.0 10.9*  NEUTROABS 5.3 7.9*  HGB 13.0 13.7  HCT 39.7 40.2  MCV 86.5 86.5  PLT 241 251   Cardiac Enzymes: No results for  input(s): CKTOTAL, CKMB, CKMBINDEX, TROPONINI in the last 168 hours. BNP (last 3 results) No results for input(s): BNP in the last 8760 hours.  ProBNP (last 3 results) No results for input(s): PROBNP in the last 8760 hours.  CBG:  Recent Labs Lab 04/01/15 1934 04/02/15 0150 04/02/15 0748  GLUCAP 196* 218* 219*    No results found for this or any previous visit (from the past 240 hour(s)).   Studies: Dg Chest 2 View  04/01/2015   CLINICAL DATA:  Nausea, vomiting, and diarrhea for 2 days. Chills. Chronic back pain. Sore on the buttocks.  EXAM: CHEST  2 VIEW  COMPARISON:  03/30/2014  FINDINGS: Shallow inspiration with mild elevation of the right hemidiaphragm. Linear atelectasis in the right lung base. Normal heart size and pulmonary vascularity. No focal airspace disease or consolidation in the lungs. No blunting of costophrenic angles. No pneumothorax. Mediastinal contours appear intact. Degenerative changes in the spine. Surgical clips in the upper abdomen.  IMPRESSION: No active cardiopulmonary disease.   Electronically Signed   By: Burman Nieves M.D.   On: 04/01/2015 18:41   Ct Abdomen Pelvis W Contrast  04/01/2015   CLINICAL DATA:  Nausea, vomiting, diarrhea for 2 days, chills  EXAM: CT ABDOMEN AND PELVIS WITH CONTRAST  TECHNIQUE: Multidetector CT  imaging of the abdomen and pelvis was performed using the standard protocol following bolus administration of intravenous contrast.  CONTRAST:  50mL OMNIPAQUE IOHEXOL 300 MG/ML SOLN, OMNIPAQUE IOHEXOL 300 MG/ML SOLN  COMPARISON:  12/20/2012 and 11/20/2014  FINDINGS: Again noted compression fracture of T12 vertebral body. Stable L1 and L2 significant chronic compression fractures. Stable postsurgical fusion lower lumbar spine. Extensive atherosclerotic calcifications of abdominal aorta, SMA and bilateral iliac arteries.  Status postcholecystectomy. Mild intrahepatic biliary ductal dilatation. No focal hepatic mass. The pancreas is  atrophic the spleen and adrenal glands are unremarkable. No aortic aneurysm. Kidneys are symmetrical in size and enhancement. No hydronephrosis or hydroureter. Cyst in midpole of the left kidney measures 1.9 cm.  There is no small bowel obstruction. No ascites or free air. No adenopathy. No pericecal inflammation. The patient is status post hysterectomy. There is high density material anterior inferior aspect of the urinary bladder which may be postsurgical in nature.  There is thickening of distal rectal/ anal wall please see axial image 80. Clinical correlation is necessary. Focal inflammation or proctitis cannot be excluded. Moderate stool noted in right colon. Delayed renal images shows bilateral renal symmetrical excretion. Bilateral visualized proximal ureter is unremarkable.  IMPRESSION: 1. Stable postsurgical changes and chronic compression fractures lower thoracic and lumbar spine. 2. No acute inflammatory process within abdomen. 3. Extensive atherosclerotic vascular calcifications. No hydronephrosis or hydroureter. 4. There is thickening of distal rectal/anal wall see axial image 79. Proctitis cannot be excluded. Clinical correlation is necessary. Probable postsurgical changes anterior perineum inferior aspect of bladder.   Electronically Signed   By: Natasha Mead M.D.   On: 04/01/2015 20:26    Scheduled Meds: . aspirin EC  81 mg Oral q morning - 10a  . cefTRIAXone (ROCEPHIN)  IV  2 g Intravenous QHS  . DULoxetine  30 mg Oral q morning - 10a  . enoxaparin (LOVENOX) injection  40 mg Subcutaneous Q24H  . feeding supplement (GLUCERNA 1.2 CAL)  237 mL Oral BID  . insulin aspart  0-9 Units Subcutaneous TID WC  . insulin glargine  16 Units Subcutaneous QHS  . iron polysaccharides  150 mg Oral Daily  . loratadine  10 mg Oral Daily  . LORazepam  0.5 mg Oral Q12H  . metronidazole  500 mg Intravenous Q8H  . morphine  15 mg Oral Q12H  . pantoprazole  40 mg Oral Daily  . potassium chloride SA  20 mEq  Oral q morning - 10a   Continuous Infusions: . sodium chloride 100 mL/hr (04/02/15 0145)    Principal Problem:   Nausea vomiting and diarrhea Active Problems:   Proctitis   Diabetes mellitus type 2, controlled   Essential hypertension   Nausea, vomiting and diarrhea   Pressure ulcer    Time spent: 35 minutes.     Hartley Barefoot A  [facility] Pager [phone]. If 7PM-7AM, please contact night-coverage at [email or web address], password [facility] 04/02/2015, 10:09 AM  LOS: 1 day

## 2015-04-02 NOTE — Progress Notes (Signed)
Initial Nutrition Assessment  DOCUMENTATION CODES:   Non-severe (moderate) malnutrition in context of chronic illness  INTERVENTION:   Provide Glucerna Shake po TID, each supplement provides 220 kcal and 10 grams of protein D/c Glucerna 1.2 Encourage PO intake RD to continue to monitor  NUTRITION DIAGNOSIS:   Increased nutrient needs related to wound healing as evidenced by estimated needs.  GOAL:   Patient will meet greater than or equal to 90% of their needs  MONITOR:   PO intake, Supplement acceptance, Labs, Weight trends, Skin, I & O's  REASON FOR ASSESSMENT:   Malnutrition Screening Tool    ASSESSMENT:   81 y.o. female with history of diabetes mellitus type 2, hypertension presents to the ER because of multiple episodes of diarrhea yesterday. A day previously patient also had nausea and vomiting.   Pt in room with husband at bedside. Pt confused and pre-occupied with getting her husband to get some personal items for her. Pt did report adequate appetite, PO intake: 100%. Pt states that she drinks Glucerna shakes instead of Glucerna 1.2 that is ordered.  Per weight history documentation, pt has lost 23 lb since 1 year ago, however this is insignificant for time frame.  Nutrition-Focused physical exam completed. Findings are moderate fat depletion, moderate muscle depletion, and no edema.   Labs reviewed: CBGs: 196-219 Low K  Diet Order:  Diet heart healthy/carb modified Room service appropriate?: Yes; Fluid consistency:: Thin  Skin:  Wound (see comment) (Stage I pressure ulcer -sacrum)  Last BM:  8/23  Height:   Ht Readings from Last 1 Encounters:  04/02/15  (1.626 m)    Weight:   Wt Readings from Last 1 Encounters:  04/02/15 127 lb 3.3 oz (57.7 kg)    Ideal Body Weight:  54.5 kg  BMI:  Body mass index is 21.82 kg/(m^2).  Estimated Nutritional Needs:   Kcal:  1450-1650  Protein:  70-80g  Fluid:  1.5L/day  EDUCATION NEEDS:   No  education needs identified at this time  Tilda Franco, MS, RD, LDN Pager: [phone] After Hours Pager: [phone]

## 2015-04-02 NOTE — Progress Notes (Signed)
ANTIBIOTIC CONSULT NOTE - INITIAL  Pharmacy Consult for Ceftriaxone Indication: intra-abominal, proctitis  Allergies  Allergen Reactions  . Ciprofloxacin Other (See Comments)    Leg and feet numbness  . Lidoderm [Lidocaine] Itching, Rash and Other (See Comments)    Burning sensations also  . Ace Inhibitors Other (See Comments)    Poorly tolerated-per PCP  . Angiotensin Receptor Blockers Other (See Comments)    Poorly tolerated-per PCP  . Glucotrol [Glipizide] Nausea Only and Other (See Comments)    Headaches also  . Triple Antibiotic [Bacitracin-Neomycin-Polymyxin] Other (See Comments)    Poorly tolerated  . Neosporin [Neomycin-Bacitracin Zn-Polymyx] Rash  . Sulfa Antibiotics Rash  . Zyrtec [Cetirizine] Itching and Rash    Patient Measurements:   Adjusted Body Weight:   Vital Signs: Temp: 98.4 F (36.9 C) (08/22 2312) Temp Source: Oral (08/22 2312) BP: 168/90 mmHg (08/22 2312) Pulse Rate: 103 (08/22 2312) Intake/Output from previous day:   Intake/Output from this shift:    Labs:  Recent Labs  04/01/15 1823  WBC 8.0  HGB 13.0  PLT 241  CREATININE 0.70   CrCl cannot be calculated (Unknown ideal weight.). No results for input(s): VANCOTROUGH, VANCOPEAK, VANCORANDOM, GENTTROUGH, GENTPEAK, GENTRANDOM, TOBRATROUGH, TOBRAPEAK, TOBRARND, AMIKACINPEAK, AMIKACINTROU, AMIKACIN in the last 72 hours.   Microbiology: No results found for this or any previous visit (from the past 720 hour(s)).  Medical History: Past Medical History  Diagnosis Date  . Incontinence of urine   . Diarrhea   . Neuropathy     bilateral feet  . GERD (gastroesophageal reflux disease)   . Depression   . Urinary, incontinence, stress female   . History of shingles 2009  . Degenerative lumbar spinal stenosis   . Type II diabetes mellitus     Type 2 IDDM  X 40 years;   . Arthritis     lower back/hands  . Altered mental status 01/13/2013  . Hypertension   . Pneumonia   . Anginal pain      Medications:  Anti-infectives    Start     Dose/Rate Route Frequency Ordered Stop   04/02/15 0600  metroNIDAZOLE (FLAGYL) IVPB 500 mg     500 mg 100 mL/hr over 60 Minutes Intravenous Every 8 hours 04/02/15 0056     04/02/15 0100  cefTRIAXone (ROCEPHIN) 2 g in dextrose 5 % 50 mL IVPB     2 g 100 mL/hr over 30 Minutes Intravenous Daily at bedtime 04/02/15 0059     04/01/15 2230  metroNIDAZOLE (FLAGYL) IVPB 500 mg     500 mg 100 mL/hr over 60 Minutes Intravenous  Once 04/01/15 2221 04/01/15 2338     Assessment: Patient with intra-abdominal infection, proctitis.    Goal of Therapy:  Rocephin based on manufacturer dosing recommendations.   Plan: Ceftriaxone 2gm iv q24hr  Follow up culture results  Aleene Davidson Crowford 04/02/2015,1:03 AM

## 2015-04-03 DIAGNOSIS — R112 Nausea with vomiting, unspecified: Secondary | ICD-10-CM

## 2015-04-03 DIAGNOSIS — R197 Diarrhea, unspecified: Secondary | ICD-10-CM

## 2015-04-03 LAB — BASIC METABOLIC PANEL
ANION GAP: 6 (ref 5–15)
BUN: 16 mg/dL (ref 6–20)
CO2: 29 mmol/L (ref 22–32)
Calcium: 8.3 mg/dL — ABNORMAL LOW (ref 8.9–10.3)
Chloride: 104 mmol/L (ref 101–111)
Creatinine, Ser: 0.65 mg/dL (ref 0.44–1.00)
GFR calc Af Amer: >60 mL/min (ref >60)
GLUCOSE: 118 mg/dL — AB (ref 65–99)
POTASSIUM: 3.5 mmol/L (ref 3.5–5.1)
Sodium: 139 mmol/L (ref 135–145)

## 2015-04-03 LAB — CBC
HEMATOCRIT: 39.5 % (ref 36.0–46.0)
HEMOGLOBIN: 12.7 g/dL (ref 12.0–15.0)
MCH: 28.2 pg (ref 26.0–34.0)
MCHC: 32.2 g/dL (ref 30.0–36.0)
MCV: 87.8 fL (ref 78.0–100.0)
Platelets: 191 10*3/uL (ref 150–400)
RBC: 4.5 MIL/uL (ref 3.87–5.11)
RDW: 13 % (ref 11.5–15.5)
WBC: 6.5 10*3/uL (ref 4.0–10.5)

## 2015-04-03 LAB — GLUCOSE, CAPILLARY
Glucose-Capillary: 79 mg/dL (ref 65–99)
Glucose-Capillary: 197 mg/dL — ABNORMAL HIGH (ref 65–99)

## 2015-04-03 MED ORDER — METRONIDAZOLE 500 MG PO TABS: 500.0000 mg | ORAL_TABLET | Freq: Three times a day (TID) | ORAL | Status: DC

## 2015-04-03 MED ORDER — HYDROCODONE-ACETAMINOPHEN 5-325 MG PO TABS: 1.0000 | ORAL_TABLET | Freq: Four times a day (QID) | ORAL | Status: DC | PRN

## 2015-04-03 MED ORDER — LORAZEPAM 0.5 MG PO TABS: 0.5000 mg | ORAL_TABLET | Freq: Two times a day (BID) | ORAL | Status: DC

## 2015-04-03 NOTE — Care Management Important Message (Signed)
Important MessagePatient Details  Name: [name] IM Letter given to Suzanne/ Case Manager to present to patientImportant Message  Patient Details  Name: [name] MRN: [id number] Date of Birth: 31-Dec-1932   Medicare Important Message Given:  [facility] notification given    Haskell Flirt 04/03/2015, 2:04 PM MRN: [id number] Date of Birth: [birth year]   Medicare Important Message Given:  Yes-second notification given    Haskell Flirt 04/03/2015, 2:02 PM

## 2015-04-03 NOTE — Discharge Summary (Addendum)
Physician Discharge Summary  MICAELLA GITTO ZOX:[id number] DOB: March 12, 1933 DOA: 04/01/2015  PCP: Lillia Mountain, MD  Admit date: 04/01/2015 Discharge date: 04/03/2015  Recommendations for Outpatient Follow-up:  1. Pt will need to follow up with PCP in 2 weeks post discharge 2. Please obtain BMP to evaluate electrolytes and kidney function 3. Please also check CBC to evaluate Hg and Hct levels 4. Please note that MiraLAX and Senna have been discontinued due to patient's diarrhea 5. If no further diarrhea in the next 24 hours please resume those medications as soon as possible Miralax and Senna as pt is on multiple narcotic medications  6. Questionable proctitis noted on CT studies outlined below, patient to continue Flagyl for 7 more days post discharge  Discharge Diagnoses:  Principal Problem:   Nausea vomiting and diarrhea Active Problems:   Proctitis   Diabetes mellitus type 2, controlled   Essential hypertension  Discharge Condition: Stable  Diet recommendation: Heart healthy diet discussed in details   History of present illness:  81 y.o. female with history of diabetes mellitus type 2, hypertension, presented to [facility] emergency department with main concern of one to 2 days duration of multiple episodes of nonbloody diarrhea associated with nausea and poor oral intake, throbbing and constant pain around the perirectal area. Patient denied sick contacts or exposures, no recent use of anti-biotics (please note the Keflex was noted on preadmission medication list).   Hospital Course:  Principal Problem:   Nausea vomiting and diarrhea - ? Proctitis on CT abdomen and pelvis, please see results below - Patient doing better, denies any diarrhea in the past 24 hours, therefore stool cultures and studies could not have been obtained for analysis - Continue Flagyl upon discharge for 7 more days - Please note that MiraLAX and Senna have been discontinued for now - Patient is  however a high risk of constipation due to him use of narcotic medications for pain control - Therefore, MiraLAX or Colace can be resumed as soon as possible in next 24 hours Active Problems:   Diabetes mellitus type 2, controlled - Continue insulin per home medical regimen   Essential hypertension - Reasonable inpatient control   History of chronic pain with chronic compression fractures in lower thoracic and lumbar spine - Continue analgesia as per home medical regimen   Moderate protein calorie malnutrition - With increased nutritional needs in acute illness  Procedures/Studies: Dg Chest 2 View 04/01/2015  No active cardiopulmonary disease.   Electronically Signed   By: Burman Nieves M.D.   On: 04/01/2015 18:41  Ct Abdomen Pelvis W Contrast  04/01/2015  Stable postsurgical changes and chronic compression fractures lower thoracic and lumbar spine. 2. No acute inflammatory process within abdomen. 3. Extensive atherosclerotic vascular calcifications. No hydronephrosis or hydroureter. 4. There is thickening of distal rectal/anal wall see axial image 79. Proctitis cannot be excluded. Clinical correlation is necessary. Probable postsurgical changes anterior perineum inferior aspect of bladder.    Discharge Exam: Filed Vitals:   04/03/15 0920  BP: 137/46  Pulse: 84  Temp: 98.1 F (36.7 C)  Resp: 16   Filed Vitals:   04/02/15 1400 04/02/15 2152 04/03/15 0623 04/03/15 0920  BP: 119/59 112/60 151/63 137/46  Pulse: 83 86 77 84  Temp: 98.1 F (36.7 C) 98 F (36.7 C) 98.3 F (36.8 C) 98.1 F (36.7 C)  TempSrc: Oral Oral Oral Oral  Resp: 18 18 18 16   Height:      Weight:  SpO2: 99% 98% 100% 99%    General: Pt is alert, follows commands appropriately, not in acute distress Cardiovascular: Regular rate and rhythm, no rubs, no gallops Respiratory: Clear to auscultation bilaterally, no wheezing, no crackles, no rhonchi Abdominal: Soft, non tender, non distended, bowel sounds +, no  guarding  Discharge Instructions  Discharge Instructions    Diet - low sodium heart healthy    Complete by:  As directed      Increase activity slowly    Complete by:  As directed             Medication List    STOP taking these medications        cephALEXin 500 MG capsule  Commonly known as:  KEFLEX     doxycycline 100 MG tablet  Commonly known as:  VIBRA-TABS     DSS 100 MG Caps     oxyCODONE-acetaminophen 5-325 MG per tablet  Commonly known as:  PERCOCET/ROXICET     polyethylene glycol packet  Commonly known as:  MIRALAX / GLYCOLAX     potassium chloride SA 20 MEQ tablet  Commonly known as:  K-DUR,KLOR-CON      TAKE these medications        acetaminophen 325 MG tablet  Commonly known as:  TYLENOL  Take 2 tablets (650 mg total) by mouth every 6 (six) hours as needed for mild pain or fever.     amiloride-hydrochlorothiazide 5-50 MG tablet  Commonly known as:  MODURETIC  Take 0.5 tablets by mouth daily.     aspirin EC 81 MG tablet  Take 81 mg by mouth every morning.     calcium citrate-vitamin D 315-200 MG-UNIT per tablet  Commonly known as:  CITRACAL+D  Take 2 tablets by mouth every morning.     DULoxetine 30 MG capsule  Commonly known as:  CYMBALTA  Take 30 mg by mouth every morning.     fexofenadine 180 MG tablet  Commonly known as:  ALLEGRA  Take 180 mg by mouth every morning.     GLUCERNA Liqd  Take 237 mLs by mouth 2 (two) times daily. Vanilla only     HYDROcodone-acetaminophen 5-325 MG per tablet  Commonly known as:  NORCO/VICODIN  Take 1 tablet by mouth every 6 (six) hours as needed.     insulin glargine 100 UNIT/ML injection  Commonly known as:  LANTUS  Inject 0.1 mLs (10 Units total) into the skin at bedtime.     insulin lispro 100 UNIT/ML injection  Commonly known as:  HUMALOG  Inject 7 Units into the skin 3 (three) times daily before meals.     iron polysaccharides 150 MG capsule  Commonly known as:  NIFEREX  Take 150 mg by  mouth daily.     LORazepam 0.5 MG tablet  Commonly known as:  ATIVAN  Take 1 tablet (0.5 mg total) by mouth every 12 (twelve) hours. For anxiety.     methylcellulose 1 % ophthalmic solution  Commonly known as:  ARTIFICIAL TEARS  Place 1 drop into both eyes daily as needed (for dry eyes).     metroNIDAZOLE 500 MG tablet  Commonly known as:  FLAGYL  Take 1 tablet (500 mg total) by mouth 3 (three) times daily.     morphine 15 MG 12 hr tablet  Commonly known as:  MS CONTIN  Take 1 tablet (15 mg total) by mouth every 12 (twelve) hours.     mupirocin ointment 2 %  Commonly known as:  Microsoft  Apply 1 application topically 2 (two) times daily. To affected area (unspecified).     olopatadine 0.1 % ophthalmic solution  Commonly known as:  PATANOL  Place 1 drop into both eyes 2 (two) times daily as needed (itching/watery eyes).     omeprazole 20 MG capsule  Commonly known as:  PRILOSEC  Take 20 mg by mouth every morning.     promethazine 12.5 MG tablet  Commonly known as:  PHENERGAN  Take 12.5 mg by mouth every 6 (six) hours as needed for nausea or vomiting.     SENNA-S 8.6-50 MG per tablet  Generic drug:  senna-docusate  Take 2 tablets by mouth at bedtime.            Follow-up Information    Follow up with Lillia Mountain, MD.   Specialty:  Internal Medicine   Contact information:   [address] [place] [address] [phone]       The results of significant diagnostics from this hospitalization (including imaging, microbiology, ancillary and laboratory) are listed below for reference.     Microbiology: Recent Results (from the past 240 hour(s))  Surgical pcr screen     Status: None   Collection Time: 04/02/15  8:47 AM  Result Value Ref Range Status   MRSA, PCR NEGATIVE NEGATIVE Final   Staphylococcus aureus NEGATIVE NEGATIVE Final     Labs: Basic Metabolic Panel:  Recent Labs Lab 04/01/15 1823 04/02/15 0429 04/03/15 0544  NA  139 135 139  K 3.6 3.2* 3.5  CL 102 99* 104  CO2 GLUCOSE 190* 236* 118*  BUN 23* 16 16  CREATININE 0.70 0.60 0.65  CALCIUM 9.6 8.6* 8.3*   Liver Function Tests:  Recent Labs Lab 04/01/15 1823 04/02/15 0429  AST 24 20  ALT 16 16  ALKPHOS 74 74  BILITOT 0.7 0.5  PROT 6.9 6.6  ALBUMIN 4.1 3.9    Recent Labs Lab 04/01/15 1823  LIPASE 29   CBC:  Recent Labs Lab 04/01/15 1823 04/02/15 0429 04/03/15 0544  WBC 8.0 10.9* 6.5  NEUTROABS 5.3 7.9*  --   HGB 13.0 13.7 12.7  HCT 39.7 40.2 39.5  MCV 86.5 86.5 87.8  PLT 241 251 191    CBG:  Recent Labs Lab 04/02/15 0748 04/02/15 1217 04/02/15 1635 04/02/15 2239 04/03/15 0746  GLUCAP 219* 216* 258* 144* 79   SIGNED: Time coordinating discharge: 30 minutes  MAGICK-Arran Fessel, MD  [facility] 04/03/2015, 9:45 AM Pager [phone]  If 7PM-7AM, please contact night-coverage [email or web address] Password [email or web address]

## 2015-04-03 NOTE — Progress Notes (Signed)
Report called to Alice Reichert at [place]. Iv discontinued and pt dressed. Non emergency transport arranged  By case manager

## 2015-04-03 NOTE — Discharge Instructions (Signed)

## 2015-04-03 NOTE — Clinical Social Work Note (Signed)
Clinical Social Work Assessment  Patient Details  Name: [name] MRN: [id number] Date of Birth: February 23, 1933  Date of referral:  04/03/15               Reason for consult:  Facility Placement, Discharge Planning                Permission sought to share information with:  Facility Art therapist granted to share information::  Yes, Verbal Permission Granted  Name::        Agency::     Relationship::     Contact Information:     Housing/Transportation Living arrangements for the past 2 months:  [place] of Information:  Spouse, Adult Children, Patient Patient Interpreter Needed:  None Criminal Activity/Legal Involvement Pertinent to Current Situation/Hospitalization:  No - Comment as needed Significant Relationships:  Adult Children, Spouse Lives with:  Facility Resident Do you feel safe going back to the place where you live?  Yes Need for family participation in patient care:  Yes (Comment)  Care giving concerns:  Son requested pt be seen by PT prior to D/C.   Social Worker assessment / plan:  Pt hospitalized on 04/01/15 from [facility] ( ALF ) with Nausea vomiting and diarrhea. CSW met with pt / spouse and spoke with son to assist with d/c planning. Pt / family plan for pt to return to ALF. PT evaluated pt this am and felt PT at ALF was not required. Pt is ready for d/c today. NSG reviewed d/c summary, avs,scripts. Scripts included in d/c packet. Clinicals sent to ALF for review. CSW spoke with [facility] ( nsg ) to confirm that ALF was able to readmit pt today. Pt / family updated and in agreement with plan for d/c today back to ALF. PTAR transport is required. Son is aware that out of pocket costs may be associated with PTAR transport.   Employment status:  Retired Nurse, adult PT Recommendations:  No Follow Up Information / Referral to community resources:   (None needed at this  time.)  Patient/Family's Response to care:  Pt / family agree plan for d/c today back to ALF.  Patient/Family's Understanding of and Emotional Response to Diagnosis, Current Treatment, and Prognosis:  Pt's son spoke with MD prior to pt's d/c. Spouse reports that pt does complain about having to live at ALF but pt's care cannot be managed at home. Emotional Assessment Appearance:  Appears stated age Attitude/Demeanor/Rapport:   (cooperative) Affect (typically observed):  Anxious Orientation:  Oriented to Self, Oriented to Place Alcohol / Substance use:  Not Applicable Psych involvement (Current and /or in the community):  No (Comment)  Discharge Needs  Concerns to be addressed:  Discharge Planning Concerns Readmission within the last 30 days:  No Current discharge risk:  None Barriers to Discharge:  No Barriers Identified   Loraine Maple  [phone] 04/03/2015, 2:47 PM

## 2015-04-03 NOTE — Evaluation (Signed)
Physical Therapy Evaluation Patient Details Name: [name] MRN: [id number] DOB: 17-Oct-1932 Today's Date: 04/03/2015   History of Present Illness  81 y.o. female admitted from ALF with N/V/D.  Clinical Impression  Pt independently transferred out of bed and walked 140' with RW without loss of balance. Recommending return to ALF with assist for ADLs.     Follow Up Recommendations Other (comment) (return to ALF with assistance for bathing/dressing)    Equipment Recommendations  None recommended by PT    Recommendations for Other Services       Precautions / Restrictions Precautions Precautions: Fall Restrictions Weight Bearing Restrictions: No      Mobility  Bed Mobility Overal bed mobility: Modified Independent             General bed mobility comments: with bedrail, HOB up 30*  Transfers Overall transfer level: Modified independent Equipment used: Rolling walker (2 wheeled)                Ambulation/Gait Ambulation/Gait assistance: Modified independent (Device/Increase time) Ambulation Distance (Feet): 140 Feet Assistive device: Rolling walker (2 wheeled) Gait Pattern/deviations: Step-through pattern;Decreased step length - right;Decreased step length - left;Trunk flexed     General Gait Details: flexed neck and trunk, pt can partially correct with verbal cues, no LOB  Stairs            Wheelchair Mobility    Modified Rankin (Stroke Patients Only)       Balance Overall balance assessment: Modified Independent                                           Pertinent Vitals/Pain Pain Assessment: No/denies pain    Home Living Family/patient expects to be discharged to:: Assisted living               Home Equipment: Walker - 4 wheels Additional Comments: pt walks to dining room independently at ALF, has assist for bathing    Prior Function Level of Independence: Needs assistance   Gait / Transfers Assistance  Needed: modified independent with rollator  ADL's / Homemaking Assistance Needed: assist with bathing/dressing        Hand Dominance   Dominant Hand: Right    Extremity/Trunk Assessment   Upper Extremity Assessment: Overall WFL for tasks assessed           Lower Extremity Assessment: Overall WFL for tasks assessed      Cervical / Trunk Assessment: Kyphotic  Communication   Communication: No difficulties  Cognition Arousal/Alertness: Awake/alert Behavior During Therapy: WFL for tasks assessed/performed Overall Cognitive Status: Within Functional Limits for tasks assessed                      General Comments      Exercises        Assessment/Plan    PT Assessment Patent does not need any further PT services  PT Diagnosis     PT Problem List    PT Treatment Interventions     PT Goals (Current goals can be found in the Care Plan section) Acute Rehab PT Goals Patient Stated Goal: to be able to walk to dining room PT Goal Formulation: All assessment and education complete, DC therapy    Frequency     Barriers to discharge        Co-evaluation  End of Session Equipment Utilized During Treatment: Gait belt Activity Tolerance: Patient tolerated treatment well;No increased pain Patient left: in chair;with call bell/phone within reach;with family/visitor present Nurse Communication: Mobility status         Time: 1610-9604 PT Time Calculation (min) (ACUTE ONLY): 25 min   Charges:   PT Evaluation $Initial PT Evaluation Tier I: 1 Procedure PT Treatments $Gait Training: 8-22 mins   PT G Codes:        Tamala Ser 04/03/2015, 11:32 AM [phone]

## 2015-05-13 ENCOUNTER — Ambulatory Visit (INDEPENDENT_AMBULATORY_CARE_PROVIDER_SITE_OTHER): Payer: Medicare Other | Admitting: Ophthalmology

## 2015-05-13 DIAGNOSIS — H43813 Vitreous degeneration, bilateral: Secondary | ICD-10-CM

## 2015-05-13 DIAGNOSIS — H35033 Hypertensive retinopathy, bilateral: Secondary | ICD-10-CM

## 2015-05-13 DIAGNOSIS — E11319 Type 2 diabetes mellitus with unspecified diabetic retinopathy without macular edema: Secondary | ICD-10-CM

## 2015-05-13 DIAGNOSIS — I10 Essential (primary) hypertension: Secondary | ICD-10-CM | POA: Diagnosis not present

## 2015-05-13 DIAGNOSIS — E113393 Type 2 diabetes mellitus with moderate nonproliferative diabetic retinopathy without macular edema, bilateral: Secondary | ICD-10-CM | POA: Diagnosis not present

## 2015-11-10 IMAGING — CR DG FOOT COMPLETE 3+V*R*
3 series · 3 of 3 positions shown · non-contrast
Comparison: MRI 03/18/2014, right foot series [DATE]

CLINICAL DATA: Swollen and red foot. Diabetes. Initial evaluation.

EXAM:
RIGHT FOOT COMPLETE - 3+ VIEW

[x foot ap right]
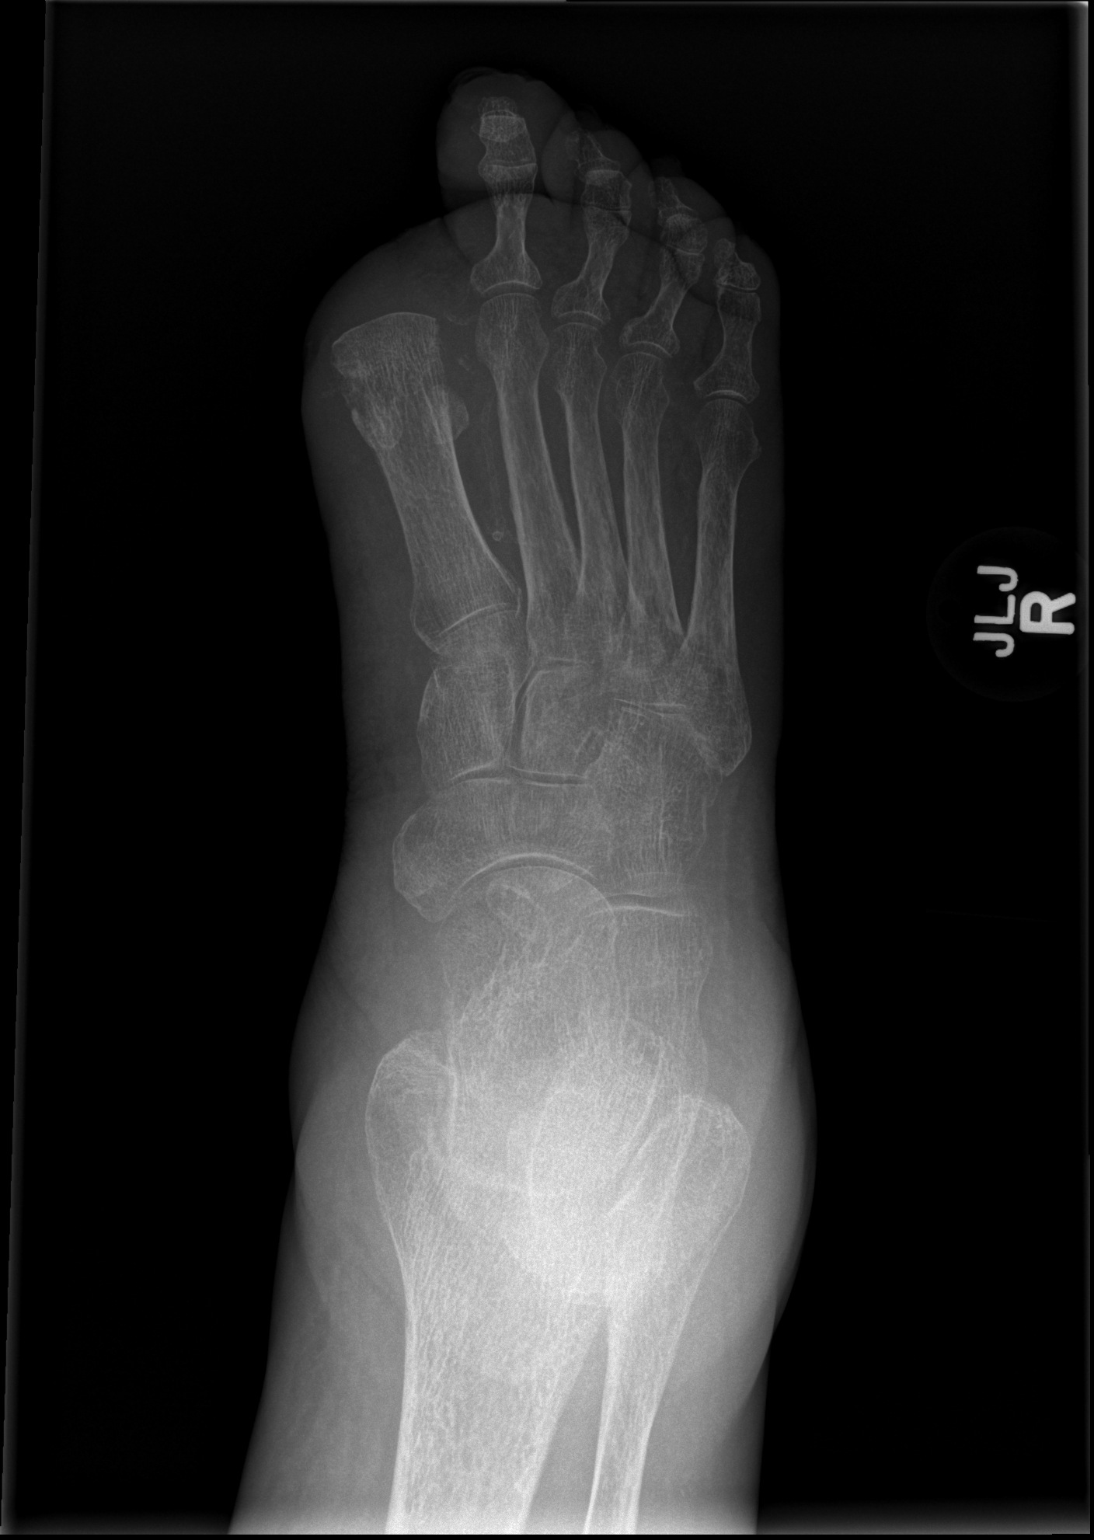

[x foot obl right]
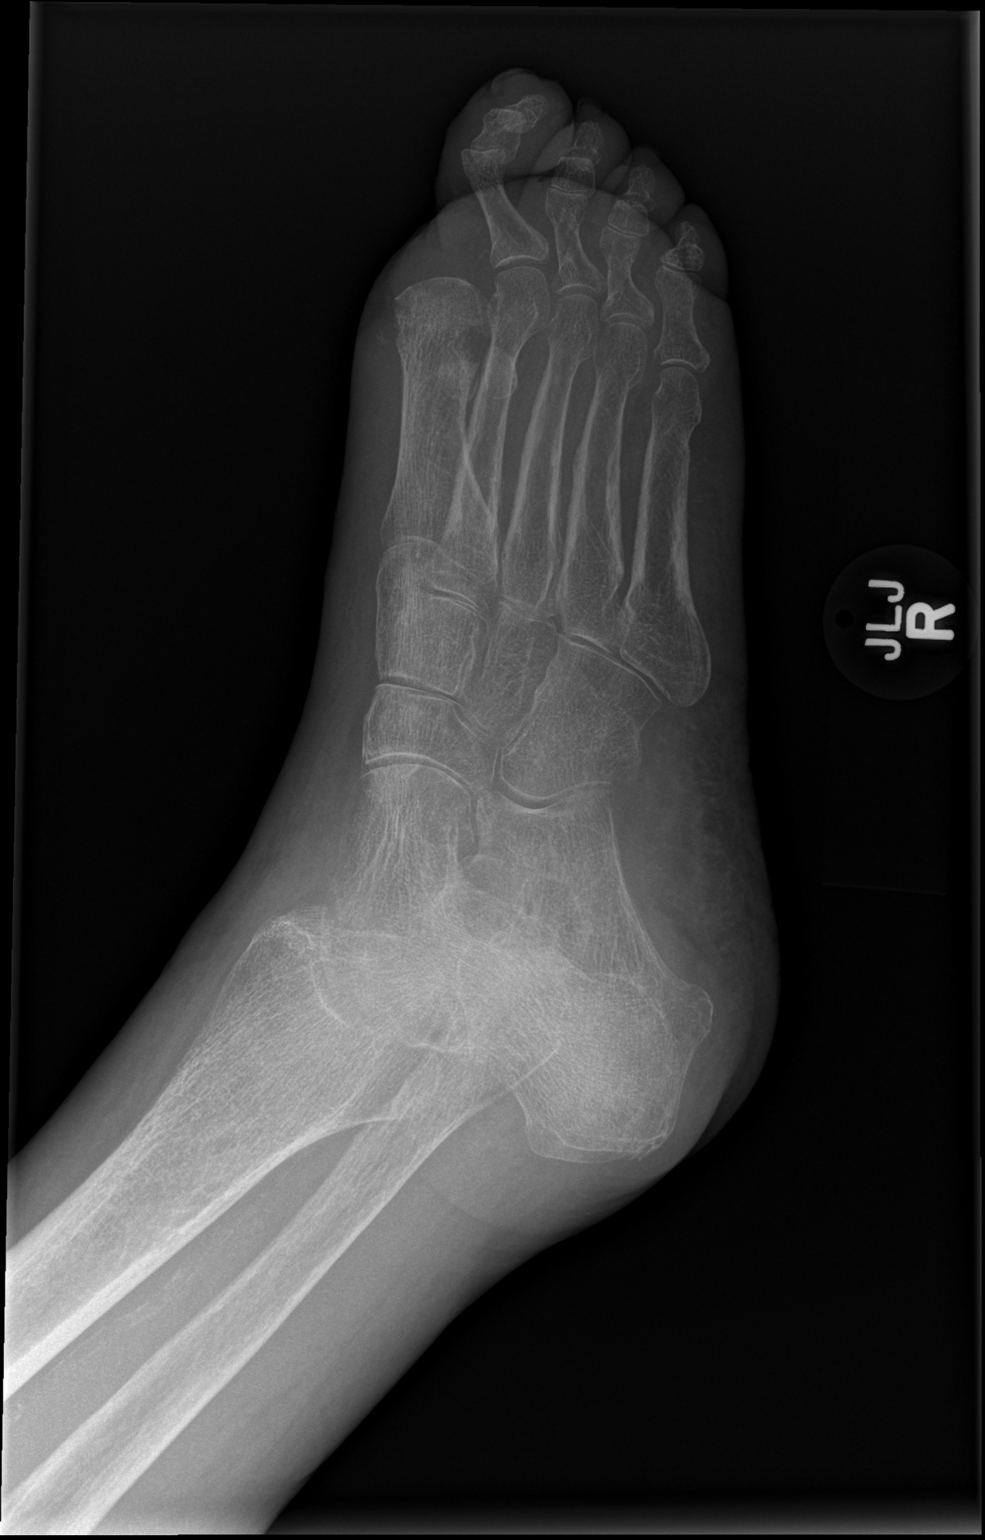

[x foot lat right]
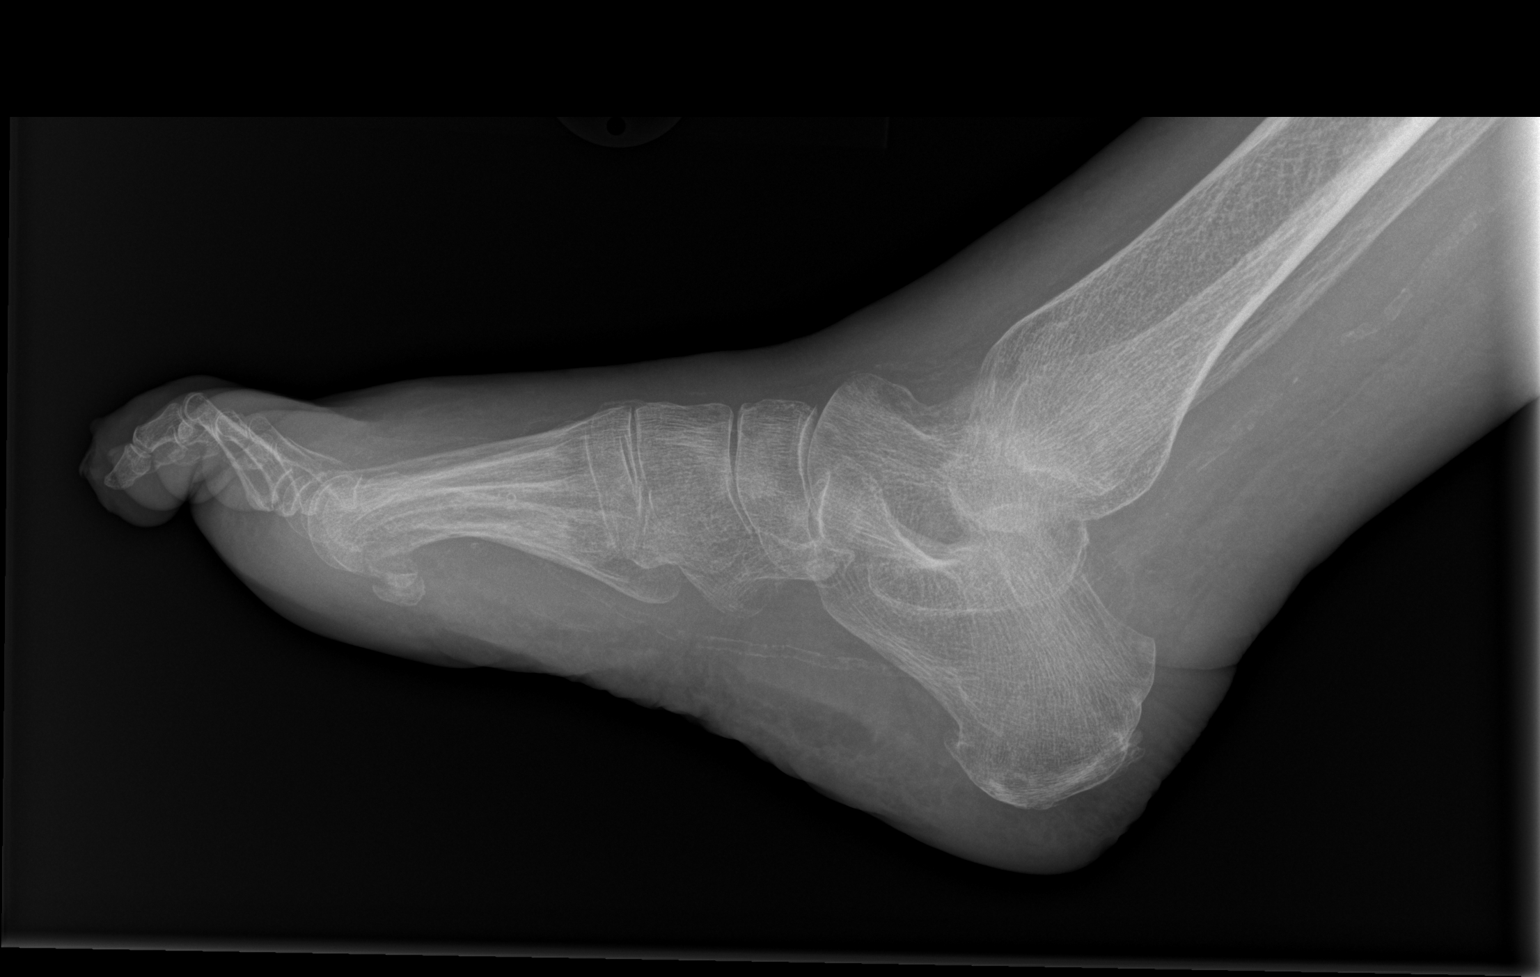

[3 of 3 positions shown; findings below may reference images not displayed]

FINDINGS: Diffuse soft tissue swelling is present. Vascular calcification is
present. Patient has had a prior first digit amputation. Bony
erosion noted along the lateral aspect of the distal first
metatarsals noted. This is most likely secondary osteomyelitis. This
is a new finding.
IMPRESSION: 1. Diffuse soft tissue swelling consistent with cellulitis.
2. Lytic area along the lateral aspect of the distal right first
metatarsal consistent with a prominent focus of osteomyelitis .
These results will be called to the ordering clinician or
representative by the Radiologist Assistant, and communication
documented in the PACS or zVision Dashboard.

## 2016-03-01 ENCOUNTER — Emergency Department (HOSPITAL_COMMUNITY)
Admission: EM | Admit: 2016-03-01 | Discharge: 2016-03-02 | Disposition: A | Discharge status: Other Acute Inpatient Hospital | Payer: Medicare Other | Attending: Emergency Medicine | Admitting: Emergency Medicine

## 2016-03-01 ENCOUNTER — Encounter (HOSPITAL_COMMUNITY): Payer: Self-pay | Admitting: *Deleted

## 2016-03-01 DIAGNOSIS — F329 Major depressive disorder, single episode, unspecified: Secondary | ICD-10-CM | POA: Insufficient documentation

## 2016-03-01 DIAGNOSIS — E119 Type 2 diabetes mellitus without complications: Secondary | ICD-10-CM | POA: Insufficient documentation

## 2016-03-01 DIAGNOSIS — F332 Major depressive disorder, recurrent severe without psychotic features: Secondary | ICD-10-CM | POA: Diagnosis not present

## 2016-03-01 DIAGNOSIS — Z049 Encounter for examination and observation for unspecified reason: Secondary | ICD-10-CM

## 2016-03-01 DIAGNOSIS — M1990 Unspecified osteoarthritis, unspecified site: Secondary | ICD-10-CM | POA: Diagnosis not present

## 2016-03-01 DIAGNOSIS — R45851 Suicidal ideations: Secondary | ICD-10-CM | POA: Diagnosis not present

## 2016-03-01 DIAGNOSIS — Z79899 Other long term (current) drug therapy: Secondary | ICD-10-CM | POA: Insufficient documentation

## 2016-03-01 DIAGNOSIS — I10 Essential (primary) hypertension: Secondary | ICD-10-CM | POA: Diagnosis not present

## 2016-03-01 DIAGNOSIS — Z794 Long term (current) use of insulin: Secondary | ICD-10-CM | POA: Insufficient documentation

## 2016-03-01 LAB — URINALYSIS, ROUTINE W REFLEX MICROSCOPIC
Bilirubin Urine: NEGATIVE
Glucose, UA: NEGATIVE mg/dL
Hgb urine dipstick: NEGATIVE
Ketones, ur: NEGATIVE mg/dL
Leukocytes, UA: NEGATIVE
Nitrite: NEGATIVE
Protein, ur: NEGATIVE mg/dL
Specific Gravity, Urine: 1.013 (ref 1.005–1.030)
pH: 7.5 (ref 5.0–8.0)

## 2016-03-01 LAB — ACETAMINOPHEN LEVEL: Acetaminophen (Tylenol), Serum: <10 ug/mL — ABNORMAL LOW (ref 10–30)

## 2016-03-01 LAB — RAPID URINE DRUG SCREEN, HOSP PERFORMED
Amphetamines: NOT DETECTED
Barbiturates: NOT DETECTED
Benzodiazepines: NOT DETECTED
Cocaine: NOT DETECTED
Opiates: POSITIVE — AB
Tetrahydrocannabinol: NOT DETECTED

## 2016-03-01 LAB — COMPREHENSIVE METABOLIC PANEL
ALT: 15 U/L (ref 14–54)
AST: 21 U/L (ref 15–41)
Albumin: 3.8 g/dL (ref 3.5–5.0)
Alkaline Phosphatase: 81 U/L (ref 38–126)
Anion gap: 6 (ref 5–15)
BUN: 22 mg/dL — ABNORMAL HIGH (ref 6–20)
CO2: 32 mmol/L (ref 22–32)
Calcium: 9.1 mg/dL (ref 8.9–10.3)
Chloride: 97 mmol/L — ABNORMAL LOW (ref 101–111)
Creatinine, Ser: 0.77 mg/dL (ref 0.44–1.00)
GFR calc Af Amer: >60 mL/min (ref >60)
GFR calc non Af Amer: >60 mL/min (ref >60)
Glucose, Bld: 209 mg/dL — ABNORMAL HIGH (ref 65–99)
Potassium: 3.6 mmol/L (ref 3.5–5.1)
Sodium: 135 mmol/L (ref 135–145)
Total Bilirubin: 0.6 mg/dL (ref 0.3–1.2)
Total Protein: 6.4 g/dL — ABNORMAL LOW (ref 6.5–8.1)

## 2016-03-01 LAB — CBC WITH DIFFERENTIAL/PLATELET
Basophils Absolute: 0 10*3/uL (ref 0.0–0.1)
Basophils Relative: 0 %
Eosinophils Absolute: 0.2 10*3/uL (ref 0.0–0.7)
Eosinophils Relative: 2 %
HCT: 39.4 % (ref 36.0–46.0)
Hemoglobin: 12.8 g/dL (ref 12.0–15.0)
Lymphocytes Relative: 28 %
Lymphs Abs: 2.1 10*3/uL (ref 0.7–4.0)
MCH: 28.1 pg (ref 26.0–34.0)
MCHC: 32.5 g/dL (ref 30.0–36.0)
MCV: 86.6 fL (ref 78.0–100.0)
Monocytes Absolute: 0.7 10*3/uL (ref 0.1–1.0)
Monocytes Relative: 9 %
Neutro Abs: 4.6 10*3/uL (ref 1.7–7.7)
Neutrophils Relative %: 61 %
Platelets: 256 10*3/uL (ref 150–400)
RBC: 4.55 MIL/uL (ref 3.87–5.11)
RDW: 13.8 % (ref 11.5–15.5)
WBC: 7.6 10*3/uL (ref 4.0–10.5)

## 2016-03-01 LAB — SALICYLATE LEVEL: Salicylate Lvl: <4 mg/dL (ref 2.8–30.0)

## 2016-03-01 MED ORDER — LORAZEPAM 0.5 MG PO TABS
0.5000 mg | ORAL_TABLET | Freq: Every day | ORAL | Status: DC
  Administered 2016-03-01: 0.5 mg via ORAL
  Filled 2016-03-01: qty 1

## 2016-03-01 MED ORDER — POLYSACCHARIDE IRON COMPLEX 150 MG PO CAPS
150.0000 mg | ORAL_CAPSULE | Freq: Every day | ORAL | Status: DC
  Administered 2016-03-02: 150 mg via ORAL
  Filled 2016-03-01: qty 1

## 2016-03-01 MED ORDER — ENSURE ENLIVE PO LIQD
237.0000 mL | Freq: Two times a day (BID) | ORAL | Status: DC
  Administered 2016-03-01 – 2016-03-02 (×2): 237 mL via ORAL
  Filled 2016-03-01 (×3): qty 237

## 2016-03-01 MED ORDER — OLOPATADINE HCL 0.1 % OP SOLN
1.0000 [drp] | Freq: Two times a day (BID) | OPHTHALMIC | Status: DC
  Administered 2016-03-01 – 2016-03-02 (×2): 1 [drp] via OPHTHALMIC
  Filled 2016-03-01: qty 5

## 2016-03-01 MED ORDER — ONDANSETRON HCL 4 MG PO TABS: 4.0000 mg | ORAL_TABLET | Freq: Three times a day (TID) | ORAL | Status: DC | PRN

## 2016-03-01 MED ORDER — TRIAMTERENE-HCTZ 75-50 MG PO TABS
1.0000 | ORAL_TABLET | Freq: Every day | ORAL | Status: DC
  Administered 2016-03-02: 1 via ORAL
  Filled 2016-03-01: qty 1

## 2016-03-01 MED ORDER — POLYVINYL ALCOHOL 1.4 % OP SOLN
1.0000 [drp] | OPHTHALMIC | Status: DC | PRN
  Filled 2016-03-01: qty 15

## 2016-03-01 MED ORDER — ACETAMINOPHEN 325 MG PO TABS
650.0000 mg | ORAL_TABLET | ORAL | Status: DC | PRN
  Administered 2016-03-02 (×2): 650 mg via ORAL
  Filled 2016-03-01 (×2): qty 2

## 2016-03-01 MED ORDER — POLYETHYLENE GLYCOL 3350 17 G PO PACK
17.0000 g | PACK | Freq: Every day | ORAL | Status: DC
  Filled 2016-03-01: qty 1

## 2016-03-01 MED ORDER — SENNOSIDES-DOCUSATE SODIUM 8.6-50 MG PO TABS
2.0000 | ORAL_TABLET | Freq: Every day | ORAL | Status: DC
  Administered 2016-03-01: 2 via ORAL
  Filled 2016-03-01: qty 2

## 2016-03-01 MED ORDER — INSULIN GLARGINE 100 UNIT/ML ~~LOC~~ SOLN
20.0000 [IU] | Freq: Every day | SUBCUTANEOUS | Status: DC
  Administered 2016-03-01: 20 [IU] via SUBCUTANEOUS
  Filled 2016-03-01 (×2): qty 0.2

## 2016-03-01 MED ORDER — GOLD BOND EX POWD: Freq: Two times a day (BID) | CUTANEOUS | Status: DC | PRN

## 2016-03-01 MED ORDER — PROMETHAZINE HCL 25 MG PO TABS: 12.5000 mg | ORAL_TABLET | Freq: Four times a day (QID) | ORAL | Status: DC | PRN

## 2016-03-01 MED ORDER — DULOXETINE HCL 30 MG PO CPEP
30.0000 mg | ORAL_CAPSULE | Freq: Every day | ORAL | Status: DC
  Administered 2016-03-02: 30 mg via ORAL
  Filled 2016-03-01: qty 1

## 2016-03-01 MED ORDER — CALCIUM CITRATE-VITAMIN D 500-400 MG-UNIT PO CHEW
1.0000 | CHEWABLE_TABLET | Freq: Every day | ORAL | Status: DC
  Administered 2016-03-02: 1 via ORAL
  Filled 2016-03-01: qty 1

## 2016-03-01 MED ORDER — PANTOPRAZOLE SODIUM 40 MG PO TBEC
40.0000 mg | DELAYED_RELEASE_TABLET | Freq: Every day | ORAL | Status: DC
  Administered 2016-03-02 (×2): 40 mg via ORAL
  Filled 2016-03-01 (×2): qty 1

## 2016-03-01 MED ORDER — ASPIRIN EC 81 MG PO TBEC
81.0000 mg | DELAYED_RELEASE_TABLET | Freq: Every day | ORAL | Status: DC
  Administered 2016-03-02: 81 mg via ORAL
  Filled 2016-03-01: qty 1

## 2016-03-01 MED ORDER — IBUPROFEN 200 MG PO TABS
600.0000 mg | ORAL_TABLET | Freq: Three times a day (TID) | ORAL | Status: DC | PRN
  Filled 2016-03-01: qty 3

## 2016-03-01 MED ORDER — ALUM & MAG HYDROXIDE-SIMETH 200-200-20 MG/5ML PO SUSP: 30.0000 mL | ORAL | Status: DC | PRN

## 2016-03-01 MED ORDER — OXYCODONE-ACETAMINOPHEN 5-325 MG PO TABS
1.0000 | ORAL_TABLET | Freq: Once | ORAL | Status: AC
  Administered 2016-03-01: 0.5 via ORAL

## 2016-03-01 MED ORDER — HYDROCODONE-ACETAMINOPHEN 5-325 MG PO TABS
1.0000 | ORAL_TABLET | Freq: Four times a day (QID) | ORAL | Status: DC | PRN
  Administered 2016-03-01: 0.5 via ORAL
  Administered 2016-03-01 – 2016-03-02 (×2): 1 via ORAL
  Filled 2016-03-01 (×4): qty 1

## 2016-03-01 MED ORDER — INSULIN ASPART 100 UNIT/ML ~~LOC~~ SOLN
7.0000 [IU] | Freq: Three times a day (TID) | SUBCUTANEOUS | Status: DC
  Administered 2016-03-02 (×3): 7 [IU] via SUBCUTANEOUS
  Filled 2016-03-01 (×3): qty 1

## 2016-03-01 NOTE — BH Assessment (Signed)
Tele Assessment Note   [name] is an 82 y.o. female who presented to [facility] with a complaint of suicidal ideation.  Pt and her adult son provided the following history:  Pt lives in a [facility].  She is married, and her husband resides in the family home.  Pt has severe arthritis and takes morphine to manage the pain on a daily basis.  She lives in assisted living because her husband cannot manage her medications.  In addition, Pt is diabetic and has had several toes amputated.  Pt reported that she is in such physical pain that she decided to kill herself by overdosing on an unknown quantity of Tylenol tablets today.  She continues to experience suicidal ideation.  "I wish I would die."  In addition to suicidal ideation, Pt endorsed the following depressive symptoms:  Persistent and unremitting despondency, isolation, fatigue, insomnia (which she relates to her uncomfortable bed), poor appetite, and feelings of hopelessness.  She denied any substance use concerns, homicidal ideation, self-injury, or auditory/visual hallucination.  Pt's medication is managed by staff at the assisted living facility, and Pt's son questioned whether Pt actually overdosed on Tylenol, a pro-biotic she has in her room, or whether she ingested any medication at all.  Pt insisted that she did overdose on Tylenol with intent to die.  During assessment, Pt presented as oriented and calm.  Her eye contact was poor -- she was resting on a hospital bed and had her eyes closed for most of the assessment.  Demeanor was cooperative.  Pt was in scrubs and appeared appropriately groomed.  Pt reported mood as depressed and affect was congruent.  Pt endorsed an ongoing desire to die and expressed a plan to overdose on pills.  She also endorsed other depressive symptoms (see above).  Pt's concentration was intact.  Remote memory was intact.  Recent memory was fair -- -there was some question as to whether  mis-remembered her overdose or if she overdosed at all.  Pt's speech was soft in volume, slow in rhythm, and indicated depressive preoccupation.  Her thought processes were within normal limits.  Thought content indicated suicidal ideation.  There was no evidence of delusion.  Pt's insight and judgment were fair to poor.  Pt's impulse control was deemed poor as evidenced by her overdose.  Consulted with Annie Main, NP, who recommended gero-psych placement for patient.  Diagnosis: Major Depressive Disorder, Single Ep., Severe w/o psychotic symptoms  Past Medical History:  Past Medical History:  Diagnosis Date  . Altered mental status 01/13/2013  . Anginal pain (HCC)   . Arthritis    lower back/hands  . Degenerative lumbar spinal stenosis   . Depression   . Diarrhea   . GERD (gastroesophageal reflux disease)   . History of shingles 2009  . Hypertension   . Incontinence of urine   . Neuropathy (HCC)    bilateral feet  . Pneumonia   . Type II diabetes mellitus (HCC)    Type 2 IDDM  X 40 years;   . Urinary, incontinence, stress female     Past Surgical History:  Procedure Laterality Date  . ABDOMINAL HYSTERECTOMY  1982  . AMPUTATION Right 03/30/2014   Procedure: Right Great Toe Amputation at MTP Joint;  Surgeon: Nadara Mustard, MD;  Location: [facility] OR;  Service: Orthopedics;  Laterality: Right;  . AMPUTATION N/A 06/03/2014   Procedure: AMPUTATION LEFT GREAT TOE  AND  RIGHT SECOND TOE;  Surgeon: Nadara Mustard,  MD;  Location: [facility] OR;  Service: Orthopedics;  Laterality: N/A;  . AMPUTATION Left 07/13/2014   Procedure: Amputation 2nd Toe MTP;  Surgeon: Nadara Mustard, MD;  Location: [facility] OR;  Service: Orthopedics;  Laterality: Left;  . BREAST BIOPSY  1990   bilaterally  . CATARACT EXTRACTION W/ INTRAOCULAR LENS IMPLANT    . CHOLECYSTECTOMY  1988  . CYSTOSCOPY  2009   macroplastique inj  . CYSTOSCOPY  10/15/2011   Procedure: CYSTOSCOPY;  Surgeon: Kathi Ludwig, MD;  Location[facility];  Service: Urology;  Laterality: N/A;  . FLEXIBLE SIGMOIDOSCOPY  02/28/2012   Procedure: FLEXIBLE SIGMOIDOSCOPY;  Surgeon: Willis Modena, MD;  Location: [facility];  Service: Endoscopy;  Laterality: N/A;  . INCONTINENCE SURGERY  2005  . OVARIAN CYST SURGERY  1972  . POSTERIOR FUSION LUMBAR SPINE  05/09/2012  . RECTOCELE REPAIR  10/15/2011   Procedure: POSTERIOR REPAIR (RECTOCELE);  Surgeon: Kathi Ludwig, MD;  Location: [facility];  Service: Urology;;  posterior vaginal vault repair with sacral spinus repair with graft  . TONSILLECTOMY  1964    Family History:  Family History  Problem Relation Age of Onset  . Osteoarthritis Sister   . Osteoarthritis Brother     Social History:  reports that she has never smoked. She has never used smokeless tobacco. She reports that she does not drink alcohol or use drugs.  Additional Social History:  Alcohol / Drug Use Pain Medications: See PTA Prescriptions: See PTA Over the Counter: See PTA History of alcohol / drug use?: No history of alcohol / drug abuse  CIWA: CIWA-Ar BP: 151/74 Pulse Rate: 86 COWS:    PATIENT STRENGTHS: (choose at least two) Average or above average intelligence Communication skills Supportive family/friends  Allergies:  Allergies  Allergen Reactions  . Ciprofloxacin Other (See Comments)    Reaction:  Leg/feet numbness   . Lidoderm [Lidocaine] Itching, Rash and Other (See Comments)    Reaction:  Burning   . Ace Inhibitors Other (See Comments)    Reaction:  Unknown   . Angiotensin Receptor Blockers Other (See Comments)    Reaction:  Unknown   . Glucotrol [Glipizide] Nausea Only and Other (See Comments)    Reaction:  Headaches   . Triple Antibiotic [Bacitracin-Neomycin-Polymyxin] Other (See Comments)    Reaction:  Unknown   . Neosporin [Neomycin-Bacitracin Zn-Polymyx] Rash  . Sulfa Antibiotics Rash  . Zyrtec [Cetirizine] Itching and Rash    Home Medications:  (Not in a  hospital admission)  OB/GYN Status:  No LMP recorded. Patient has had a hysterectomy.  General Assessment Data Location of Assessment: [facility] ED TTS Assessment: In system Is this a Tele or Face-to-Face Assessment?: Tele Assessment Is this an Initial Assessment or a Re-assessment for this encounter?: Initial Assessment Marital status: Married Is patient pregnant?: No Pregnancy Status: No Living Arrangements: Other (Comment) ([name]; husband at family home) Can pt return to current living arrangement?: Yes Admission Status: Voluntary Is patient capable of signing voluntary admission?: Yes Referral Source: Self/Family/Friend Insurance type: [facility]  Medical Screening Exam [facility] Walk-in ONLY) Medical Exam completed: Yes  Crisis Care Plan Living Arrangements: Other (Comment) ([name]; husband at family home) Name of Psychiatrist: None reported Name of Therapist: None reported  Education Status Is patient currently in school?: No  Risk to self with the past 6 months Suicidal Ideation: Yes-Currently Present Has patient been a risk to self within the past 6 months prior to  admission? : No Suicidal Intent: Yes-Currently Present Has patient had any suicidal intent within the past 6 months prior to admission? : No Is patient at risk for suicide?: Yes Suicidal Plan?: Yes-Currently Present Has patient had any suicidal plan within the past 6 months prior to admission? : No Specify Current Suicidal Plan: Overdose Access to Means: No What has been your use of drugs/alcohol within the last 12 months?: Denied Previous Attempts/Gestures: No Intentional Self Injurious Behavior: None Family Suicide History: No Recent stressful life event(s): Recent negative physical changes (Chronic pain -- arthritis and diabetes) Persecutory voices/beliefs?: No Depression: Yes Depression Symptoms: Despondent, Insomnia, Isolating, Fatigue, Feeling worthless/self  pity, Loss of interest in usual pleasures Substance abuse history and/or treatment for substance abuse?: No Suicide prevention information given to non-admitted patients: Not applicable  Risk to Others within the past 6 months Homicidal Ideation: No Does patient have any lifetime risk of violence toward others beyond the six months prior to admission? : No Thoughts of Harm to Others: No Current Homicidal Intent: No Current Homicidal Plan: No Access to Homicidal Means: No History of harm to others?: No Assessment of Violence: None Noted Does patient have access to weapons?: No Criminal Charges Pending?: No Does patient have a court date: No Is patient on probation?: No  Psychosis Hallucinations: None noted (Pt reported once seeing someone in her room who was not ther) Delusions: None noted  Mental Status Report Appearance/Hygiene: In scrubs, Unremarkable (Covered up to neck with blanket) Eye Contact: Poor (Head back and on pillow, eyes closed) Motor Activity: Unremarkable (Laying still) Speech: Soft, Slow, Logical/coherent (Responsive to questions asked) Level of Consciousness: Quiet/awake Mood: Depressed Affect: Blunted Anxiety Level: None Thought Processes: Coherent, Relevant Judgement: Partial Orientation: Person, Place, Time, Situation Obsessive Compulsive Thoughts/Behaviors: None  Cognitive Functioning Concentration: Fair Memory: Remote Intact, Recent Impaired (Remote possibly impaired- pt not clear on overdose see notes) IQ: Average Insight: Fair Impulse Control: Poor Appetite: Poor Sleep: Decreased Vegetative Symptoms: None  ADLScreening [facility] Assessment Services) Patient's cognitive ability adequate to safely complete daily activities?: Yes Patient able to express need for assistance with ADLs?: Yes Independently performs ADLs?: Yes (appropriate for developmental age)  Prior Inpatient Therapy Prior Inpatient Therapy: No  Prior Outpatient Therapy Prior  Outpatient Therapy: No Does patient have an ACCT team?: No Does patient have Intensive In-House Services?  : No Does patient have [facility] services? : No Does patient have P4CC services?: No  ADL Screening (condition at time of admission) Patient's cognitive ability adequate to safely complete daily activities?: Yes Is the patient deaf or have difficulty hearing?: No Does the patient have difficulty seeing, even when wearing glasses/contacts?: No Does the patient have difficulty concentrating, remembering, or making decisions?: No Patient able to express need for assistance with ADLs?: Yes Does the patient have difficulty dressing or bathing?: No Independently performs ADLs?: Yes (appropriate for developmental age) Weakness of Legs: Both (Per son, Pt has amputated toes -- diabetes) Weakness of Arms/Hands: None     Therapy Consults (therapy consults require a physician order) PT Evaluation Needed: No OT Evalulation Needed: No SLP Evaluation Needed: No Abuse/Neglect Assessment (Assessment to be complete while patient is alone) Physical Abuse: Denies Verbal Abuse: Denies Sexual Abuse: Denies Exploitation of patient/patient's resources: Denies Self-Neglect: Denies Values / Beliefs Cultural Requests During Hospitalization: None Spiritual Requests During Hospitalization: None Consults Spiritual Care Consult Needed: No Social Work Consult Needed: No Merchant navy officer (For Healthcare) Does patient have an advance directive?: Yes Type of Advance Directive: Out of  facility DNR (pink MOST or yellow form) Pre-existing out of facility DNR order (yellow form or pink MOST form): Yellow form placed in chart (order not valid for inpatient use) Does patient want to make changes to advanced directive?: No - Patient declined Copy of advanced directive(s) in chart?: No - copy requested    Additional Information 1:1 In Past 12 Months?: No CIRT Risk: No Elopement Risk: No Does patient have  medical clearance?: Yes     Disposition:  Disposition Initial Assessment Completed for this Encounter: Yes Disposition of Patient: Inpatient treatment program Type of inpatient treatment program: Adult (Per Annie Main, NP, Pt meets gero psych inpt criteria)  Dorris Fetch Esli Jernigan 03/01/2016 6:32 PM

## 2016-03-01 NOTE — ED Notes (Signed)
PA at bedside to speak with patient family

## 2016-03-01 NOTE — ED Notes (Signed)
Pt wanded by security. 

## 2016-03-01 NOTE — ED Triage Notes (Signed)
Per EMS Pt from [place] [facility] [phone] [facility] dr) with c/o SI. Pt sts "I just want to take all my pills and go to sleep)

## 2016-03-01 NOTE — ED Notes (Signed)
Hydrocodone pill broken in half to help pt swallow; second half of pill fell on floor after pt had taken first half; 0.5 tablet wasted in pyxis

## 2016-03-01 NOTE — ED Notes (Signed)
Pt placed in maroon scrubs; belongings put in bag

## 2016-03-01 NOTE — ED Notes (Signed)
Bed: [facility] Expected date:  Expected time:  Means of arrival:  Comments: 

## 2016-03-02 ENCOUNTER — Emergency Department (HOSPITAL_COMMUNITY): Payer: Medicare Other

## 2016-03-02 DIAGNOSIS — F332 Major depressive disorder, recurrent severe without psychotic features: Secondary | ICD-10-CM

## 2016-03-02 DIAGNOSIS — R45851 Suicidal ideations: Secondary | ICD-10-CM | POA: Diagnosis not present

## 2016-03-02 LAB — CBG MONITORING, ED
GLUCOSE-CAPILLARY: 156 mg/dL — AB (ref 65–99)
GLUCOSE-CAPILLARY: 151 mg/dL — AB (ref 65–99)
Glucose-Capillary: 186 mg/dL — ABNORMAL HIGH (ref 65–99)
Glucose-Capillary: 297 mg/dL — ABNORMAL HIGH (ref 65–99)

## 2016-03-02 NOTE — ED Notes (Signed)
Husband is at bedside.  He has been wanded

## 2016-03-02 NOTE — ED Notes (Signed)
Report given to Jennifer, RN

## 2016-03-02 NOTE — ED Notes (Signed)
Attempted to call Delorise Shiner, RN at [facility] with report.  She stated she would call back once she receives Voluntary admission form.  Form has been faxed to [phone] and [phone]

## 2016-03-02 NOTE — Progress Notes (Signed)
CSW spoke with admissions/[facility] who states that pt has been offered a bed at their facility. She states pt is welcomed to come tonight. CSW made nurse aware.  Patient has signed voluntary form. CSW placed the form in the patient's folder and faxed the the form to [facility].    Grace/[facility] ( Nurse Report Number) [phone]  Trish Mage [phone] ED CSW 03/02/2016 6:50 PM

## 2016-03-02 NOTE — Progress Notes (Signed)
Inpatient Diabetes Program Recommendations  AACE/ADA: New Consensus Statement on Inpatient Glycemic Control (2015)  Target Ranges:  Prepandial:   less than 140 mg/dL      Peak postprandial:   less than 180 mg/dL (1-2 hours)      Critically ill patients:  140 - 180 mg/dL   Lab Results  Component Value Date   GLUCAP 297 (H) 03/02/2016   HGBA1C 5.9 (H) 06/01/2014    Review of Glycemic Control  Diabetes history: DM2 Outpatient Diabetes medications: Lantus 20 units QHS, Humalog 7 units tidwc Current orders for Inpatient glycemic control: Lantus 20 units QHS, Novolog 7 units tidwc,   Inpatient Diabetes Program Recommendations:    Add Novolog sensitive tidwc Check HgbA1C to assess glycemic control prior to discharge. Last HgbA1C - 5.9% on 06/01/2014.  Will continue to follow.  Thank you. Ailene Ards, RD, LDN, CDE Inpatient Diabetes Coordinator [phone]

## 2016-03-02 NOTE — ED Provider Notes (Signed)
[facility]-EMERGENCY DEPT Provider Note   CSN: [id number] Arrival date & time: 03/01/16  1458  First Provider Contact:  First MD Initiated Contact with Patient 03/01/16 1507        History   Chief Complaint Chief Complaint  Patient presents with  . Suicidal     HPI Patient presents to the emergency department with suicidal ideation.  The patient apparently told staff at her nursing facility that she wanted to kill herself taking an overdose.  She was sent in by the staff for further evaluation.  The patient states she has not overdosed in the past, but did not take any medications today. The patient denies chest pain, shortness of breath, headache,blurred vision, neck pain, fever, cough, weakness, numbness, dizziness, anorexia, edema, abdominal pain, nausea, vomiting, diarrhea, rash, back pain, dysuria, hematemesis, bloody stool, near syncope, or syncope. Past Medical History:  Diagnosis Date  . Altered mental status 01/13/2013  . Anginal pain (HCC)   . Arthritis    lower back/hands  . Degenerative lumbar spinal stenosis   . Depression   . Diarrhea   . GERD (gastroesophageal reflux disease)   . History of shingles 2009  . Hypertension   . Incontinence of urine   . Neuropathy (HCC)    bilateral feet  . Pneumonia   . Type II diabetes mellitus (HCC)    Type 2 IDDM  X 40 years;   . Urinary, incontinence, stress female     Patient Active Problem List   Diagnosis Date Noted  . Nausea vomiting and diarrhea 04/02/2015  . Diabetes mellitus type 2, controlled (HCC) 04/02/2015  . Essential hypertension 04/02/2015  . Nausea, vomiting and diarrhea 04/02/2015  . Pressure ulcer 04/02/2015  . Proctitis 04/01/2015  . Osteomyelitis of toe of left foot (HCC) 07/13/2014  . Malnutrition of moderate degree (HCC) 06/04/2014  . Acute osteomyelitis of toe of left foot (HCC) 06/01/2014  . GERD (gastroesophageal reflux disease) 06/01/2014  . Depression 06/01/2014  . Cellulitis of foot, right  06/01/2014  . Acute osteomyelitis of toe of right foot (HCC) 03/30/2014  . Cellulitis of right foot 03/17/2014  . Cellulitis of right leg 03/17/2014  . Metabolic encephalopathy 01/13/2013  . Lower GI bleeding 02/26/2012  . Delirium 02/02/2012  . UTI (lower urinary tract infection) 01/27/2012  . HTN (hypertension) 01/27/2012  . Nausea & vomiting 12/12/2011  . Constipation 12/12/2011  . Diabetes mellitus (HCC) 12/12/2011  . Low back pain radiating to left leg 12/12/2011    Past Surgical History:  Procedure Laterality Date  . ABDOMINAL HYSTERECTOMY  1982  . AMPUTATION Right 03/30/2014   Procedure: Right Great Toe Amputation at MTP Joint;  Surgeon: Nadara Mustard, MD;  Location: [facility] OR;  Service: Orthopedics;  Laterality: Right;  . AMPUTATION N/A 06/03/2014   Procedure: AMPUTATION LEFT GREAT TOE  AND  RIGHT SECOND TOE;  Surgeon: Nadara Mustard, MD;  Location: [facility] OR;  Service: Orthopedics;  Laterality: N/A;  . AMPUTATION Left 07/13/2014   Procedure: Amputation 2nd Toe MTP;  Surgeon: Nadara Mustard, MD;  Location: [facility] OR;  Service: Orthopedics;  Laterality: Left;  . BREAST BIOPSY  1990   bilaterally  . CATARACT EXTRACTION W/ INTRAOCULAR LENS IMPLANT    . CHOLECYSTECTOMY  1988  . CYSTOSCOPY  2009   macroplastique inj  . CYSTOSCOPY  10/15/2011   Procedure: CYSTOSCOPY;  Surgeon: Kathi Ludwig, MD;  Location: [facility];  Service: Urology;  Laterality: N/A;  . FLEXIBLE SIGMOIDOSCOPY  02/28/2012  Procedure: FLEXIBLE SIGMOIDOSCOPY;  Surgeon: Willis Modena, MD;  Location: [facility] ENDOSCOPY;  Service: Endoscopy;  Laterality: N/A;  . INCONTINENCE SURGERY  2005  . OVARIAN CYST SURGERY  1972  . POSTERIOR FUSION LUMBAR SPINE  05/09/2012  . RECTOCELE REPAIR  10/15/2011   Procedure: POSTERIOR REPAIR (RECTOCELE);  Surgeon: Kathi Ludwig, MD;  Location: [facility];  Service: Urology;;  posterior vaginal vault repair with sacral spinus repair with graft  . TONSILLECTOMY   1964    OB History    No data available       Home Medications    Prior to Admission medications   Medication Sig Start Date End Date Taking? Authorizing Provider  acetaminophen (TYLENOL) 325 MG tablet Take 2 tablets (650 mg total) by mouth every 6 (six) hours as needed for mild pain or fever. 03/20/14  Yes Richarda Overlie, MD  amiloride-hydrochlorothiazide (MODURETIC) 5-50 MG tablet Take 0.5 tablets by mouth daily.   Yes Historical Provider, MD  aspirin EC 81 MG tablet Take 81 mg by mouth daily.    Yes Historical Provider, MD  Calcium Citrate-Vitamin D (CITRACAL MAXIMUM) 315-250 MG-UNIT TABS Take 1 tablet by mouth daily.   Yes Historical Provider, MD  DULoxetine (CYMBALTA) 30 MG capsule Take 30 mg by mouth daily.    Yes Historical Provider, MD  fexofenadine (ALLEGRA) 180 MG tablet Take 180 mg by mouth daily.    Yes Historical Provider, MD  GLUCERNA (GLUCERNA) LIQD Take 237 mLs by mouth 2 (two) times daily. Pt only likes vanilla.   Yes Historical Provider, MD  HYDROcodone-acetaminophen (NORCO/VICODIN) 5-325 MG tablet Take 1 tablet by mouth every 6 (six) hours as needed for moderate pain.   Yes Historical Provider, MD  insulin glargine (LANTUS) 100 unit/mL SOPN Inject 20 Units into the skin at bedtime.   Yes Historical Provider, MD  insulin lispro (HUMALOG) 100 UNIT/ML injection Inject 7 Units into the skin 3 (three) times daily before meals.    Yes Historical Provider, MD  iron polysaccharides (NIFEREX) 150 MG capsule Take 150 mg by mouth daily.    Yes Historical Provider, MD  LORazepam (ATIVAN) 0.5 MG tablet Take 0.5 mg by mouth at bedtime. Pt is also able to use an additional tablet daily if needed.   Yes Historical Provider, MD  Menthol-Zinc Oxide (GOLD BOND EX) Apply 1 application topically 2 (two) times daily as needed (for irritation).   Yes Historical Provider, MD  methylcellulose (ARTIFICIAL TEARS) 1 % ophthalmic solution Place 1 drop into both eyes as needed (for dry eyes).    Yes  Historical Provider, MD  morphine (MS CONTIN) 15 MG 12 hr tablet Take 15 mg by mouth 2 (two) times daily.   Yes Historical Provider, MD  olopatadine (PATANOL) 0.1 % ophthalmic solution Place 1 drop into both eyes as needed for allergies.   Yes Historical Provider, MD  omeprazole (PRILOSEC) 20 MG capsule Take 20 mg by mouth daily before breakfast.    Yes Historical Provider, MD  polyethylene glycol (MIRALAX / GLYCOLAX) packet Take 17 g by mouth daily.   Yes Historical Provider, MD  promethazine (PHENERGAN) 12.5 MG tablet Take 12.5 mg by mouth every 6 (six) hours as needed for nausea or vomiting.   Yes Historical Provider, MD  senna-docusate (SENNA-S) 8.6-50 MG per tablet Take 2 tablets by mouth at bedtime.   Yes Historical Provider, MD    Family History Family History  Problem Relation Age of Onset  . Osteoarthritis Sister   . Osteoarthritis  Brother     Social History Social History  Substance Use Topics  . Smoking status: Never Smoker  . Smokeless tobacco: Never Used  . Alcohol use No     Allergies   Ciprofloxacin; Lidoderm [lidocaine]; Ace inhibitors; Angiotensin receptor blockers; Glucotrol [glipizide]; Triple antibiotic [bacitracin-neomycin-polymyxin]; Neosporin [neomycin-bacitracin zn-polymyx]; Sulfa antibiotics; and Zyrtec [cetirizine]   Review of Systems Review of Systems  All other systems negative except as documented in the HPI. All pertinent positives and negatives as reviewed in the HPI. Physical Exam Updated Vital Signs BP 157/77 (BP Location: Right Arm)   Pulse 70   Temp 97.6 F (36.4 C) (Oral)   Resp 16   SpO2 95%   Physical Exam  Constitutional: She is oriented to person, place, and time. She appears well-developed and well-nourished. No distress.  HENT:  Head: Normocephalic and atraumatic.  Mouth/Throat: Oropharynx is clear and moist.  Eyes: Pupils are equal, round, and reactive to light.  Neck: Normal range of motion. Neck supple.  Cardiovascular:  Normal rate, regular rhythm and normal heart sounds.  Exam reveals no gallop and no friction rub.   No murmur heard. Pulmonary/Chest: Effort normal and breath sounds normal. No respiratory distress. She has no wheezes.  Abdominal: Soft. Bowel sounds are normal. She exhibits no distension. There is no tenderness.  Neurological: She is alert and oriented to person, place, and time. She exhibits normal muscle tone. Coordination normal.  Skin: Skin is warm and dry. No rash noted. No erythema.  Psychiatric: She has a normal mood and affect. Her speech is normal and behavior is normal. Thought content is not paranoid and not delusional. She expresses suicidal ideation.  Nursing note and vitals reviewed.    ED Treatments / Results  Labs (all labs ordered are listed, but only abnormal results are displayed) Labs Reviewed  COMPREHENSIVE METABOLIC PANEL - Abnormal; Notable for the following:       Result Value   Chloride 97 (*)    Glucose, Bld 209 (*)    BUN 22 (*)    Total Protein 6.4 (*)    All other components within normal limits  ACETAMINOPHEN LEVEL - Abnormal; Notable for the following:    Acetaminophen (Tylenol), Serum <10 (*)    All other components within normal limits  URINE RAPID DRUG SCREEN, HOSP PERFORMED - Abnormal; Notable for the following:    Opiates POSITIVE (*)    All other components within normal limits  CBC WITH DIFFERENTIAL/PLATELET  SALICYLATE LEVEL  URINALYSIS, ROUTINE W REFLEX MICROSCOPIC (NOT AT [facility])    EKG  EKG Interpretation None       Radiology No results found.  Procedures Procedures (including critical care time)  Medications Ordered in ED Medications  acetaminophen (TYLENOL) tablet 650 mg (not administered)  ibuprofen (ADVIL,MOTRIN) tablet 600 mg (not administered)  ondansetron (ZOFRAN) tablet 4 mg (not administered)  alum & mag hydroxide-simeth (MAALOX/MYLANTA) 200-200-20 MG/5ML suspension 30 mL (not administered)    triamterene-hydrochlorothiazide (MAXZIDE) 75-50 MG per tablet 1 tablet (not administered)  aspirin EC tablet 81 mg (not administered)  calcium citrate-vitamin D 500-400 MG-UNIT per chewable tablet 1 tablet (not administered)  DULoxetine (CYMBALTA) DR capsule 30 mg (not administered)  feeding supplement (ENSURE ENLIVE) (ENSURE ENLIVE) liquid 237 mL (237 mLs Oral Given 03/01/16 2247)  HYDROcodone-acetaminophen (NORCO/VICODIN) 5-325 MG per tablet 1 tablet (0.5 tablets Oral Given 03/01/16 2139)  insulin glargine (LANTUS) injection 20 Units (20 Units Subcutaneous Given 03/01/16 2246)  iron polysaccharides (NIFEREX) capsule 150 mg (not administered)  LORazepam (ATIVAN) tablet 0.5 mg (0.5 mg Oral Given 03/01/16 2119)  polyvinyl alcohol (LIQUIFILM TEARS) 1.4 % ophthalmic solution 1 drop (not administered)  olopatadine (PATANOL) 0.1 % ophthalmic solution 1 drop (1 drop Both Eyes Given 03/01/16 2322)  pantoprazole (PROTONIX) EC tablet 40 mg (40 mg Oral Given 03/02/16 0006)  senna-docusate (Senokot-S) tablet 2 tablet (2 tablets Oral Given 03/01/16 2119)  promethazine (PHENERGAN) tablet 12.5 mg (not administered)  polyethylene glycol (MIRALAX / GLYCOLAX) packet 17 g (not administered)  insulin aspart (novoLOG) injection 7 Units (not administered)  oxyCODONE-acetaminophen (PERCOCET/ROXICET) 5-325 MG per tablet 1 tablet (0.5 tablets Oral Given 03/01/16 2140)     Initial Impression / Assessment and Plan / ED Course  I have reviewed the triage vital signs and the nursing notes.  Pertinent labs & imaging results that were available during my care of the patient were reviewed by me and considered in my medical decision making (see chart for details).  Clinical Course   Patient had a TTS assessment and they feel that she needs inpatient geriatric psychiatric evaluation Final Clinical Impressions(s) / ED Diagnoses   Final diagnoses:  None    New Prescriptions New Prescriptions   No medications on file      Charlestine Night, PA-C 03/02/16 0040    Pricilla Loveless, MD 03/05/16 [phone]

## 2016-03-02 NOTE — ED Notes (Signed)
Contact Rhodes Borunda (son)  [phone]

## 2016-03-02 NOTE — ED Notes (Signed)
Patient belongs has been wanded

## 2016-03-02 NOTE — ED Notes (Signed)
Call placed to [facility] 

## 2016-03-02 NOTE — Consult Note (Signed)
[facility] Face-to-Face Psychiatry Consult   Reason for Consult: suicidal ideation with plan Referring Physician: EDP Patient Identification: [name] MRN:  [id number] Principal Diagnosis: Major depressive disorder, recurrent, severe w/o psychotic behavior ([place]) Diagnosis:   Patient Active Problem List   Diagnosis Date Noted  . Major depressive disorder, recurrent, severe w/o psychotic behavior ([place]) [F33.2] 03/02/2016    Priority: High  . Nausea vomiting and diarrhea [R11.2, R19.7] 04/02/2015  . Diabetes mellitus type 2, controlled ([place]) [E11.9] 04/02/2015  . Essential hypertension [I10] 04/02/2015  . Nausea, vomiting and diarrhea [R11.2, R19.7] 04/02/2015  . Pressure ulcer [L89.90] 04/02/2015  . Proctitis [K62.89] 04/01/2015  . Osteomyelitis of toe of left foot ([place]) [M86.9] 07/13/2014  . Malnutrition of moderate degree ([place]) [E44.0] 06/04/2014  . Acute osteomyelitis of toe of left foot ([place]) [M86.172] 06/01/2014  . GERD (gastroesophageal reflux disease) [K21.9] 06/01/2014  . Depression [F32.9] 06/01/2014  . Cellulitis of foot, right [L03.115] 06/01/2014  . Acute osteomyelitis of toe of right foot ([place]) [M86.171] 03/30/2014  . Cellulitis of right foot [L03.115] 03/17/2014  . Cellulitis of right leg [L03.115] 03/17/2014  . Metabolic encephalopathy [Q11.94] 01/13/2013  . Lower GI bleeding [K92.2] 02/26/2012  . Delirium [R41.0] 02/02/2012  . UTI (lower urinary tract infection) [N39.0] 01/27/2012  . HTN (hypertension) [I10] 01/27/2012  . Nausea & vomiting [R11.2] 12/12/2011  . Constipation [K59.00] 12/12/2011  . Diabetes mellitus ([place]) [E11.9] 12/12/2011  . Low back pain radiating to left leg [M54.5] 12/12/2011    Total Time spent with patient: 45 minutes  Subjective:   [name] is a 82 y.o. female patient admitted with suicidal ideation.  HPI: Patient with Major depression who lives in a nursing home, she was brought to the ED for evaluation after she apparently told  staff at her nursing facility that she wanted to kill herself taking an overdose of all her medications. Patient denies prior history of suicide attempt but she is expressing worsening depression, anxiety, suicidal thoughts, hopelessness, low energy level, anhedonia and trouble falling and staying asleep. Patient denies psychosis or delusional thinking.  Past Psychiatric History: as above  Risk to Self: Suicidal Ideation: Yes-Currently Present Suicidal Intent: Yes-Currently Present Is patient at risk for suicide?: Yes Suicidal Plan?: Yes-Currently Present Specify Current Suicidal Plan: Overdose Access to Means: No What has been your use of drugs/alcohol within the last 12 months?: Denied Intentional Self Injurious Behavior: None Risk to Others: Homicidal Ideation: No Thoughts of Harm to Others: No Current Homicidal Intent: No Current Homicidal Plan: No Access to Homicidal Means: No History of harm to others?: No Assessment of Violence: None Noted Does patient have access to weapons?: No Criminal Charges Pending?: No Does patient have a court date: No Prior Inpatient Therapy: Prior Inpatient Therapy: No Prior Outpatient Therapy: Prior Outpatient Therapy: No Does patient have an ACCT team?: No Does patient have Intensive In-House Services?  : No Does patient have [facility] services? : No Does patient have P4CC services?: No  Past Medical History:  Past Medical History:  Diagnosis Date  . Altered mental status 01/13/2013  . Anginal pain ([place])   . Arthritis    lower back/hands  . Degenerative lumbar spinal stenosis   . Depression   . Diarrhea   . GERD (gastroesophageal reflux disease)   . History of shingles 2009  . Hypertension   . Incontinence of urine   . Neuropathy ([place])    bilateral feet  . Pneumonia   . Type II diabetes mellitus ([place])  Type 2 IDDM  X 40 years;   . Urinary, incontinence, stress female     Past Surgical History:  Procedure Laterality Date  .  ABDOMINAL HYSTERECTOMY  1982  . AMPUTATION Right 03/30/2014   Procedure: Right Great Toe Amputation at MTP Joint;  Surgeon: Newt Minion, MD;  Location: [facility];  Service: Orthopedics;  Laterality: Right;  . AMPUTATION N/A 06/03/2014   Procedure: AMPUTATION LEFT GREAT TOE  AND  RIGHT SECOND TOE;  Surgeon: Newt Minion, MD;  Location: [facility];  Service: Orthopedics;  Laterality: N/A;  . AMPUTATION Left 07/13/2014   Procedure: Amputation 2nd Toe MTP;  Surgeon: Newt Minion, MD;  Location: [facility];  Service: Orthopedics;  Laterality: Left;  . BREAST BIOPSY  1990   bilaterally  . CATARACT EXTRACTION W/ INTRAOCULAR LENS IMPLANT    . CHOLECYSTECTOMY  1988  . CYSTOSCOPY  2009   macroplastique inj  . CYSTOSCOPY  10/15/2011   Procedure: CYSTOSCOPY;  Surgeon: Ailene Rud, MD;  Location: [facility];  Service: Urology;  Laterality: N/A;  . FLEXIBLE SIGMOIDOSCOPY  02/28/2012   Procedure: FLEXIBLE SIGMOIDOSCOPY;  Surgeon: Arta Silence, MD;  Location: [facility];  Service: Endoscopy;  Laterality: N/A;  . INCONTINENCE SURGERY  2005  . OVARIAN CYST SURGERY  1972  . POSTERIOR FUSION LUMBAR SPINE  05/09/2012  . RECTOCELE REPAIR  10/15/2011   Procedure: POSTERIOR REPAIR (RECTOCELE);  Surgeon: Ailene Rud, MD;  Location: [facility];  Service: Urology;;  posterior vaginal vault repair with sacral spinus repair with graft  . TONSILLECTOMY  1964   Family History:  Family History  Problem Relation Age of Onset  . Osteoarthritis Sister   . Osteoarthritis Brother    Family Psychiatric  History: Social History:  History  Alcohol Use No     History  Drug Use No    Social History   Social History  . Marital status: Married    Spouse name: N/A  . Number of children: N/A  . Years of education: N/A   Social History Main Topics  . Smoking status: Never Smoker  . Smokeless tobacco: Never Used  . Alcohol use No  . Drug use: No  . Sexual activity: No   Other  Topics Concern  . None   Social History Narrative  . None   Additional Social History:    Allergies:   Allergies  Allergen Reactions  . Ciprofloxacin Other (See Comments)    Reaction:  Leg/feet numbness   . Lidoderm [Lidocaine] Itching, Rash and Other (See Comments)    Reaction:  Burning   . Ace Inhibitors Other (See Comments)    Reaction:  Unknown   . Angiotensin Receptor Blockers Other (See Comments)    Reaction:  Unknown   . Glucotrol [Glipizide] Nausea Only and Other (See Comments)    Reaction:  Headaches   . Triple Antibiotic [Bacitracin-Neomycin-Polymyxin] Other (See Comments)    Reaction:  Unknown   . Neosporin [Neomycin-Bacitracin Zn-Polymyx] Rash  . Sulfa Antibiotics Rash  . Zyrtec [Cetirizine] Itching and Rash    Labs:  Results for orders placed or performed during the hospital encounter of 03/01/16 (from the past 48 hour(s))  Comprehensive metabolic panel     Status: Abnormal   Collection Time: 03/01/16  4:13 PM  Result Value Ref Range   Sodium 135 135 - 145 mmol/L   Potassium 3.6 3.5 - 5.1 mmol/L   Chloride 97 (L) 101 - 111 mmol/L  CO2 32 22 - 32 mmol/L   Glucose, Bld 209 (H) 65 - 99 mg/dL   BUN 22 (H) 6 - 20 mg/dL   Creatinine, Ser 0.77 0.44 - 1.00 mg/dL   Calcium 9.1 8.9 - 10.3 mg/dL   Total Protein 6.4 (L) 6.5 - 8.1 g/dL   Albumin 3.8 3.5 - 5.0 g/dL   AST 21 15 - 41 U/L   ALT 15 14 - 54 U/L   Alkaline Phosphatase 81 38 - 126 U/L   Total Bilirubin 0.6 0.3 - 1.2 mg/dL   GFR calc non Af Amer >60 >60 mL/min   GFR calc Af Amer >60 >60 mL/min    Comment: (NOTE) The eGFR has been calculated using the CKD EPI equation. This calculation has not been validated in all clinical situations. eGFR's persistently <60 mL/min signify possible Chronic Kidney Disease.    Anion gap 6 5 - 15  CBC with Differential     Status: None   Collection Time: 03/01/16  4:13 PM  Result Value Ref Range   WBC 7.6 4.0 - 10.5 K/uL   RBC 4.55 3.87 - 5.11 MIL/uL   Hemoglobin  12.8 12.0 - 15.0 g/dL   HCT 39.4 36.0 - 46.0 %   MCV 86.6 78.0 - 100.0 fL   MCH 28.1 26.0 - 34.0 pg   MCHC 32.5 30.0 - 36.0 g/dL   RDW 13.8 11.5 - 15.5 %   Platelets 256 150 - 400 K/uL   Neutrophils Relative % 61 %   Neutro Abs 4.6 1.7 - 7.7 K/uL   Lymphocytes Relative 28 %   Lymphs Abs 2.1 0.7 - 4.0 K/uL   Monocytes Relative 9 %   Monocytes Absolute 0.7 0.1 - 1.0 K/uL   Eosinophils Relative 2 %   Eosinophils Absolute 0.2 0.0 - 0.7 K/uL   Basophils Relative 0 %   Basophils Absolute 0.0 0.0 - 0.1 K/uL  Salicylate level     Status: None   Collection Time: 03/01/16  4:13 PM  Result Value Ref Range   Salicylate Lvl <4.5 2.8 - 30.0 mg/dL  Acetaminophen level     Status: Abnormal   Collection Time: 03/01/16  4:13 PM  Result Value Ref Range   Acetaminophen (Tylenol), Serum <10 (L) 10 - 30 ug/mL    Comment:        THERAPEUTIC CONCENTRATIONS VARY SIGNIFICANTLY. A RANGE OF 10-30 ug/mL MAY BE AN EFFECTIVE CONCENTRATION FOR MANY PATIENTS. HOWEVER, SOME ARE BEST TREATED AT CONCENTRATIONS OUTSIDE THIS RANGE. ACETAMINOPHEN CONCENTRATIONS >150 ug/mL AT 4 HOURS AFTER INGESTION AND >50 ug/mL AT 12 HOURS AFTER INGESTION ARE OFTEN ASSOCIATED WITH TOXIC REACTIONS.   Urine rapid drug screen (hosp performed)     Status: Abnormal   Collection Time: 03/01/16  5:35 PM  Result Value Ref Range   Opiates POSITIVE (A) NONE DETECTED   Cocaine NONE DETECTED NONE DETECTED   Benzodiazepines NONE DETECTED NONE DETECTED   Amphetamines NONE DETECTED NONE DETECTED   Tetrahydrocannabinol NONE DETECTED NONE DETECTED   Barbiturates NONE DETECTED NONE DETECTED    Comment:        DRUG SCREEN FOR MEDICAL PURPOSES ONLY.  IF CONFIRMATION IS NEEDED FOR ANY PURPOSE, NOTIFY LAB WITHIN 5 DAYS.        LOWEST DETECTABLE LIMITS FOR URINE DRUG SCREEN Drug Class       Cutoff (ng/mL) Amphetamine      1000 Barbiturate      200 Benzodiazepine   038 Tricyclics  300 Opiates          300 Cocaine           300 THC              50   Urinalysis, Routine w reflex microscopic     Status: None   Collection Time: 03/01/16  5:35 PM  Result Value Ref Range   Color, Urine YELLOW YELLOW   APPearance CLEAR CLEAR   Specific Gravity, Urine 1.013 1.005 - 1.030   pH 7.5 5.0 - 8.0   Glucose, UA NEGATIVE NEGATIVE mg/dL   Hgb urine dipstick NEGATIVE NEGATIVE   Bilirubin Urine NEGATIVE NEGATIVE   Ketones, ur NEGATIVE NEGATIVE mg/dL   Protein, ur NEGATIVE NEGATIVE mg/dL   Nitrite NEGATIVE NEGATIVE   Leukocytes, UA NEGATIVE NEGATIVE    Comment: MICROSCOPIC NOT DONE ON URINES WITH NEGATIVE PROTEIN, BLOOD, LEUKOCYTES, NITRITE, OR GLUCOSE <1000 mg/dL.  CBG monitoring, ED     Status: Abnormal   Collection Time: 03/01/16 10:44 PM  Result Value Ref Range   Glucose-Capillary 156 (H) 65 - 99 mg/dL  CBG monitoring, ED     Status: Abnormal   Collection Time: 03/02/16 12:56 AM  Result Value Ref Range   Glucose-Capillary 297 (H) 65 - 99 mg/dL    Current Facility-Administered Medications  Medication Dose Route Frequency Provider Last Rate Last Dose  . acetaminophen (TYLENOL) tablet 650 mg  650 mg Oral Q4H PRN Dalia Heading, PA-C   650 mg at 03/02/16 0158  . alum & mag hydroxide-simeth (MAALOX/MYLANTA) 200-200-20 MG/5ML suspension 30 mL  30 mL Oral PRN Dalia Heading, PA-C      . aspirin EC tablet 81 mg  81 mg Oral Daily [employer], PA-C   81 mg at 03/02/16 8453  . calcium citrate-vitamin D 500-400 MG-UNIT per chewable tablet 1 tablet  1 tablet Oral Daily Dalia Heading, PA-C   1 tablet at 03/02/16 [phone]  . DULoxetine (CYMBALTA) DR capsule 30 mg  30 mg Oral Daily Dalia Heading, PA-C   30 mg at 03/02/16 0321  . feeding supplement (ENSURE ENLIVE) (ENSURE ENLIVE) liquid 237 mL  237 mL Oral BID Dalia Heading, PA-C   237 mL at 03/02/16 0952  . HYDROcodone-acetaminophen (NORCO/VICODIN) 5-325 MG per tablet 1 tablet  1 tablet Oral Q6H PRN Dalia Heading, PA-C   1 tablet at 03/02/16 1007  .  ibuprofen (ADVIL,MOTRIN) tablet 600 mg  600 mg Oral Q8H PRN Dalia Heading, PA-C      . insulin aspart (novoLOG) injection 7 Units  7 Units Subcutaneous TID WC Dalia Heading, PA-C   7 Units at 03/02/16 0820  . insulin glargine (LANTUS) injection 20 Units  20 Units Subcutaneous QHS Dalia Heading, PA-C   20 Units at 03/01/16 2246  . iron polysaccharides (NIFEREX) capsule 150 mg  150 mg Oral Daily Dalia Heading, PA-C   150 mg at 03/02/16 2248  . LORazepam (ATIVAN) tablet 0.5 mg  0.5 mg Oral QHS Dalia Heading, PA-C   0.5 mg at 03/01/16 2119  . olopatadine (PATANOL) 0.1 % ophthalmic solution 1 drop  1 drop Both Eyes BID Dalia Heading, PA-C   1 drop at 03/02/16 0953  . ondansetron (ZOFRAN) tablet 4 mg  4 mg Oral Q8H PRN Dalia Heading, PA-C      . pantoprazole (PROTONIX) EC tablet 40 mg  40 mg Oral Daily [employer], PA-C   40 mg at 03/02/16 1007  . polyethylene glycol (MIRALAX / GLYCOLAX) packet 17 g  17 g  Oral Daily [employer], PA-C      . polyvinyl alcohol (LIQUIFILM TEARS) 1.4 % ophthalmic solution 1 drop  1 drop Both Eyes PRN Dalia Heading, PA-C      . promethazine (PHENERGAN) tablet 12.5 mg  12.5 mg Oral Q6H PRN Dalia Heading, PA-C      . senna-docusate (Senokot-S) tablet 2 tablet  2 tablet Oral QHS Dalia Heading, PA-C   2 tablet at 03/01/16 2119  . triamterene-hydrochlorothiazide (MAXZIDE) 75-50 MG per tablet 1 tablet  1 tablet Oral Daily Dalia Heading, PA-C   1 tablet at 03/02/16 6962   Current Outpatient Prescriptions  Medication Sig Dispense Refill  . acetaminophen (TYLENOL) 325 MG tablet Take 2 tablets (650 mg total) by mouth every 6 (six) hours as needed for mild pain or fever. 30 tablet 0  . amiloride-hydrochlorothiazide (MODURETIC) 5-50 MG tablet Take 0.5 tablets by mouth daily.    Marland Kitchen aspirin EC 81 MG tablet Take 81 mg by mouth daily.     . Calcium Citrate-Vitamin D (CITRACAL MAXIMUM) 315-250 MG-UNIT TABS Take 1 tablet by  mouth daily.    . DULoxetine (CYMBALTA) 30 MG capsule Take 30 mg by mouth daily.     . fexofenadine (ALLEGRA) 180 MG tablet Take 180 mg by mouth daily.     Marland Kitchen GLUCERNA (GLUCERNA) LIQD Take 237 mLs by mouth 2 (two) times daily. Pt only likes vanilla.    Marland Kitchen HYDROcodone-acetaminophen (NORCO/VICODIN) 5-325 MG tablet Take 1 tablet by mouth every 6 (six) hours as needed for moderate pain.    Marland Kitchen insulin glargine (LANTUS) 100 unit/mL SOPN Inject 20 Units into the skin at bedtime.    . insulin lispro (HUMALOG) 100 UNIT/ML injection Inject 7 Units into the skin 3 (three) times daily before meals.     . iron polysaccharides (NIFEREX) 150 MG capsule Take 150 mg by mouth daily.     Marland Kitchen LORazepam (ATIVAN) 0.5 MG tablet Take 0.5 mg by mouth at bedtime. Pt is also able to use an additional tablet daily if needed.    . Menthol-Zinc Oxide (GOLD BOND EX) Apply 1 application topically 2 (two) times daily as needed (for irritation).    . methylcellulose (ARTIFICIAL TEARS) 1 % ophthalmic solution Place 1 drop into both eyes as needed (for dry eyes).     . morphine (MS CONTIN) 15 MG 12 hr tablet Take 15 mg by mouth 2 (two) times daily.    Marland Kitchen olopatadine (PATANOL) 0.1 % ophthalmic solution Place 1 drop into both eyes as needed for allergies.    Marland Kitchen omeprazole (PRILOSEC) 20 MG capsule Take 20 mg by mouth daily before breakfast.     . polyethylene glycol (MIRALAX / GLYCOLAX) packet Take 17 g by mouth daily.    . promethazine (PHENERGAN) 12.5 MG tablet Take 12.5 mg by mouth every 6 (six) hours as needed for nausea or vomiting.    . senna-docusate (SENNA-S) 8.6-50 MG per tablet Take 2 tablets by mouth at bedtime.      Musculoskeletal: Strength & Muscle Tone: within normal limits Gait & Station: normal Patient leans: N/A  Psychiatric Specialty Exam: Physical Exam  Psychiatric: Judgment normal. Her mood appears anxious. Her speech is delayed. She is slowed and withdrawn. Cognition and memory are normal. She exhibits a depressed  mood. She expresses suicidal ideation.    Review of Systems  Constitutional: Negative.   HENT: Negative.   Eyes: Negative.   Respiratory: Negative.   Cardiovascular: Negative.   Gastrointestinal: Negative.   Genitourinary: Negative.  Musculoskeletal: Negative.   Skin: Negative.   Neurological: Negative.   Endo/Heme/Allergies: Negative.   Psychiatric/Behavioral: Positive for depression and suicidal ideas. The patient is nervous/anxious and has insomnia.     Blood pressure 171/77, pulse 84, temperature 97.9 F (36.6 C), temperature source Oral, resp. rate 18, SpO2 98 %.There is no height or weight on file to calculate BMI.  General Appearance: Casual  Eye Contact:  Good  Speech:  Clear and Coherent  Volume:  Decreased  Mood:  Depressed, Dysphoric and Hopeless  Affect:  Constricted  Thought Process:  Coherent and Descriptions of Associations: Intact  Orientation:  Full (Time, Place, and Person)  Thought Content:  Logical  Suicidal Thoughts:  Yes.  with intent/plan  Homicidal Thoughts:  No  Memory:  Immediate;   Fair Recent;   Good Remote;   Good  Judgement:  Impaired  Insight:  Shallow  Psychomotor Activity:  Psychomotor Retardation  Concentration:  Concentration: Fair and Attention Span: Fair  Recall:  [employer] of Knowledge:  Good  Language:  Good  Akathisia:  No  Handed:  Right  AIMS (if indicated):     Assets:  Communication Skills Desire for Improvement Social Support  ADL's:  Impaired  Cognition:  WNL  Sleep:   poor     Treatment Plan Summary: Plan: -Crisis stabilization -Daily contact with patient to assess and evaluate symptoms and progress in treatment, Medication management. -Continue Cymbalta 58m daily for depression.  Disposition: Recommend psychiatric Inpatient admission when medically cleared. Supportive therapy provided about ongoing stressors. Will benefit from placement in Geriatric inpatient for stabilization  ACorena Pilgrim  MD 03/02/2016 11:13 AM

## 2016-03-02 NOTE — Progress Notes (Signed)
Pt arrived from the ED side, PTAR arrived to get her, Pt has not spoken since she arrived so a psych assessment has not been completed.  Cooperative, just not been conversing with me. Barnett Hatter P

## 2016-03-02 NOTE — BH Assessment (Signed)
[facility] Assessment Progress Note  Per Thedore Mins, MD, this pt requires psychiatric hospitalization at this time.  At 15:41 Delorise Shiner calls from [facility]  Pt has been accepted to their facility by Dr Seth Bake.  EDP Tyrone Apple, MD concurs with this decision.  Please call report [phone].  Pt reluctantly agrees to sign consent for admission, and signed form has been faxed to [phone].  Pt's nurse has been notified; she agrees to call report as indicated, and to send original form along with pt.  She will determine whether it is best to transport pt via [facility] or ambulance.  Pt's son, Zoryana Monroe ([phone]), who also serves as healthcare power of attorney, has also been notified.  Doylene Canning, MA Triage Specialist [phone]

## 2016-05-10 DEATH — deceased

## 2016-05-12 ENCOUNTER — Ambulatory Visit (INDEPENDENT_AMBULATORY_CARE_PROVIDER_SITE_OTHER): Payer: Medicare Other | Admitting: Ophthalmology

## 2017-08-11 IMAGING — CR DG CHEST 2V
2 series · 2 of 2 positions shown · non-contrast
Comparison: 04/01/2015

CLINICAL DATA: Mid chest discomfort.  Altered mental status.

EXAM:
CHEST  2 VIEW

[x chest ap]
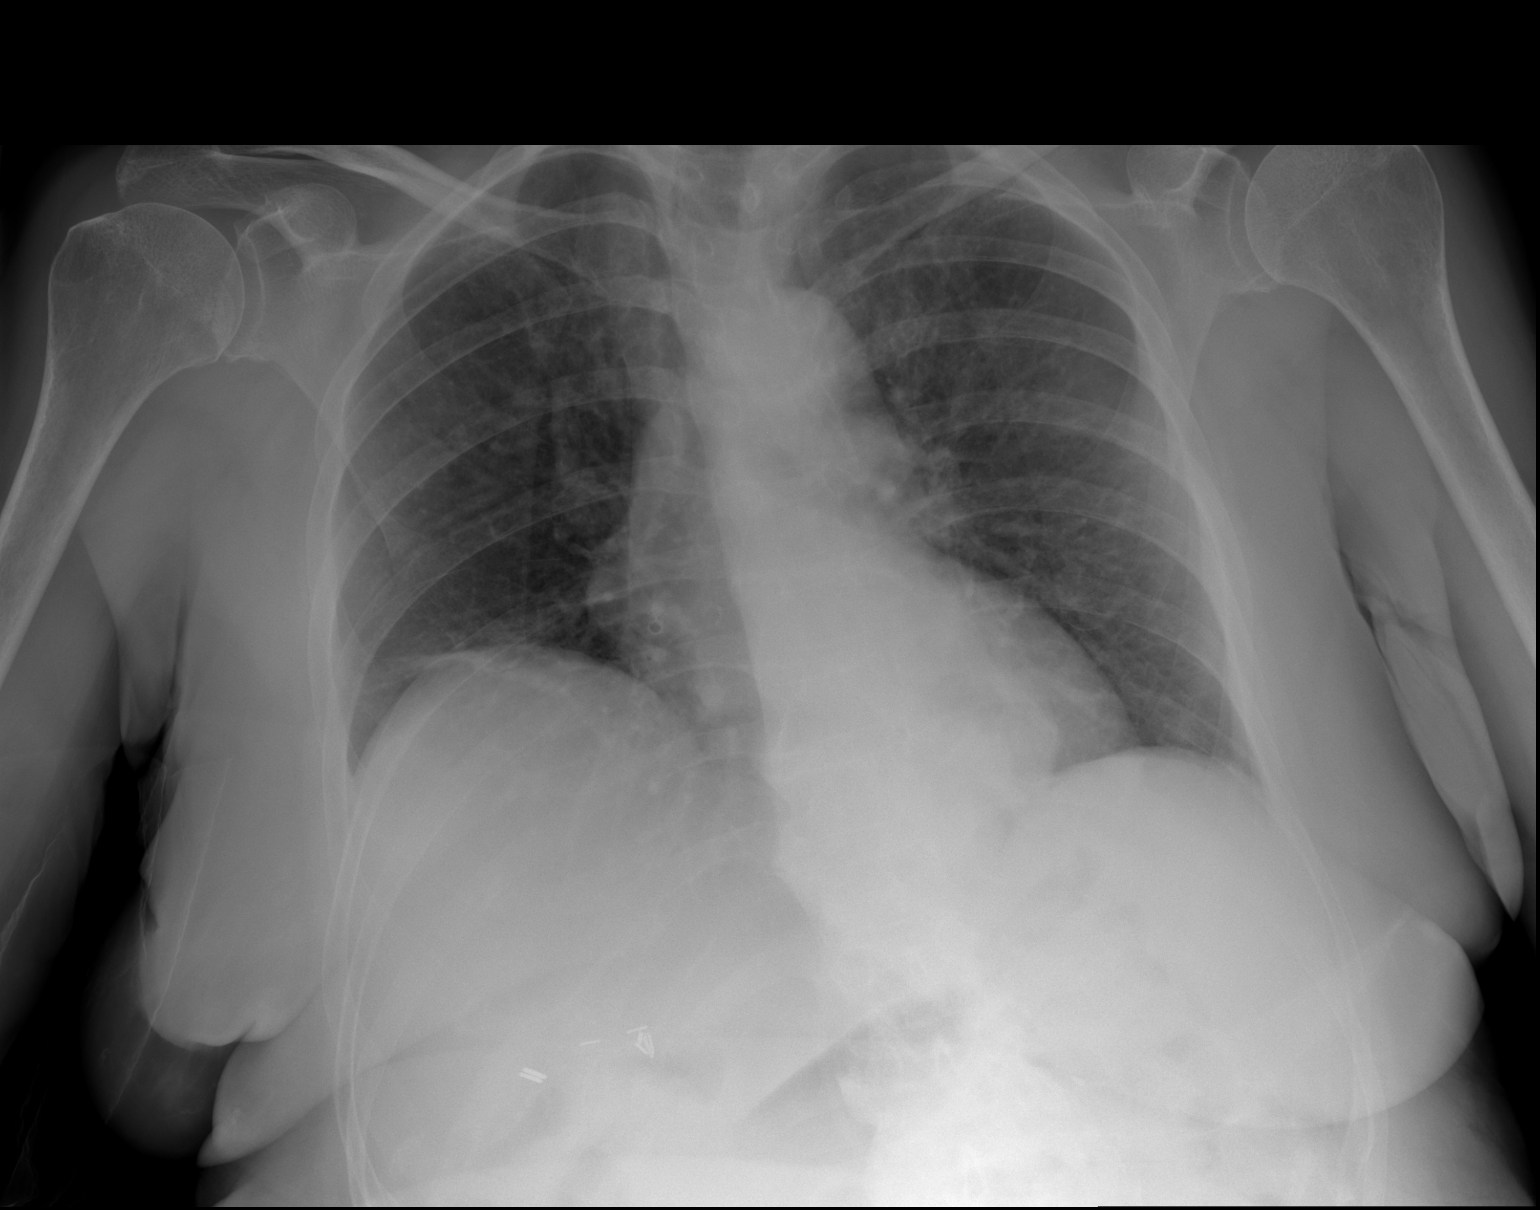

[w chest lat]
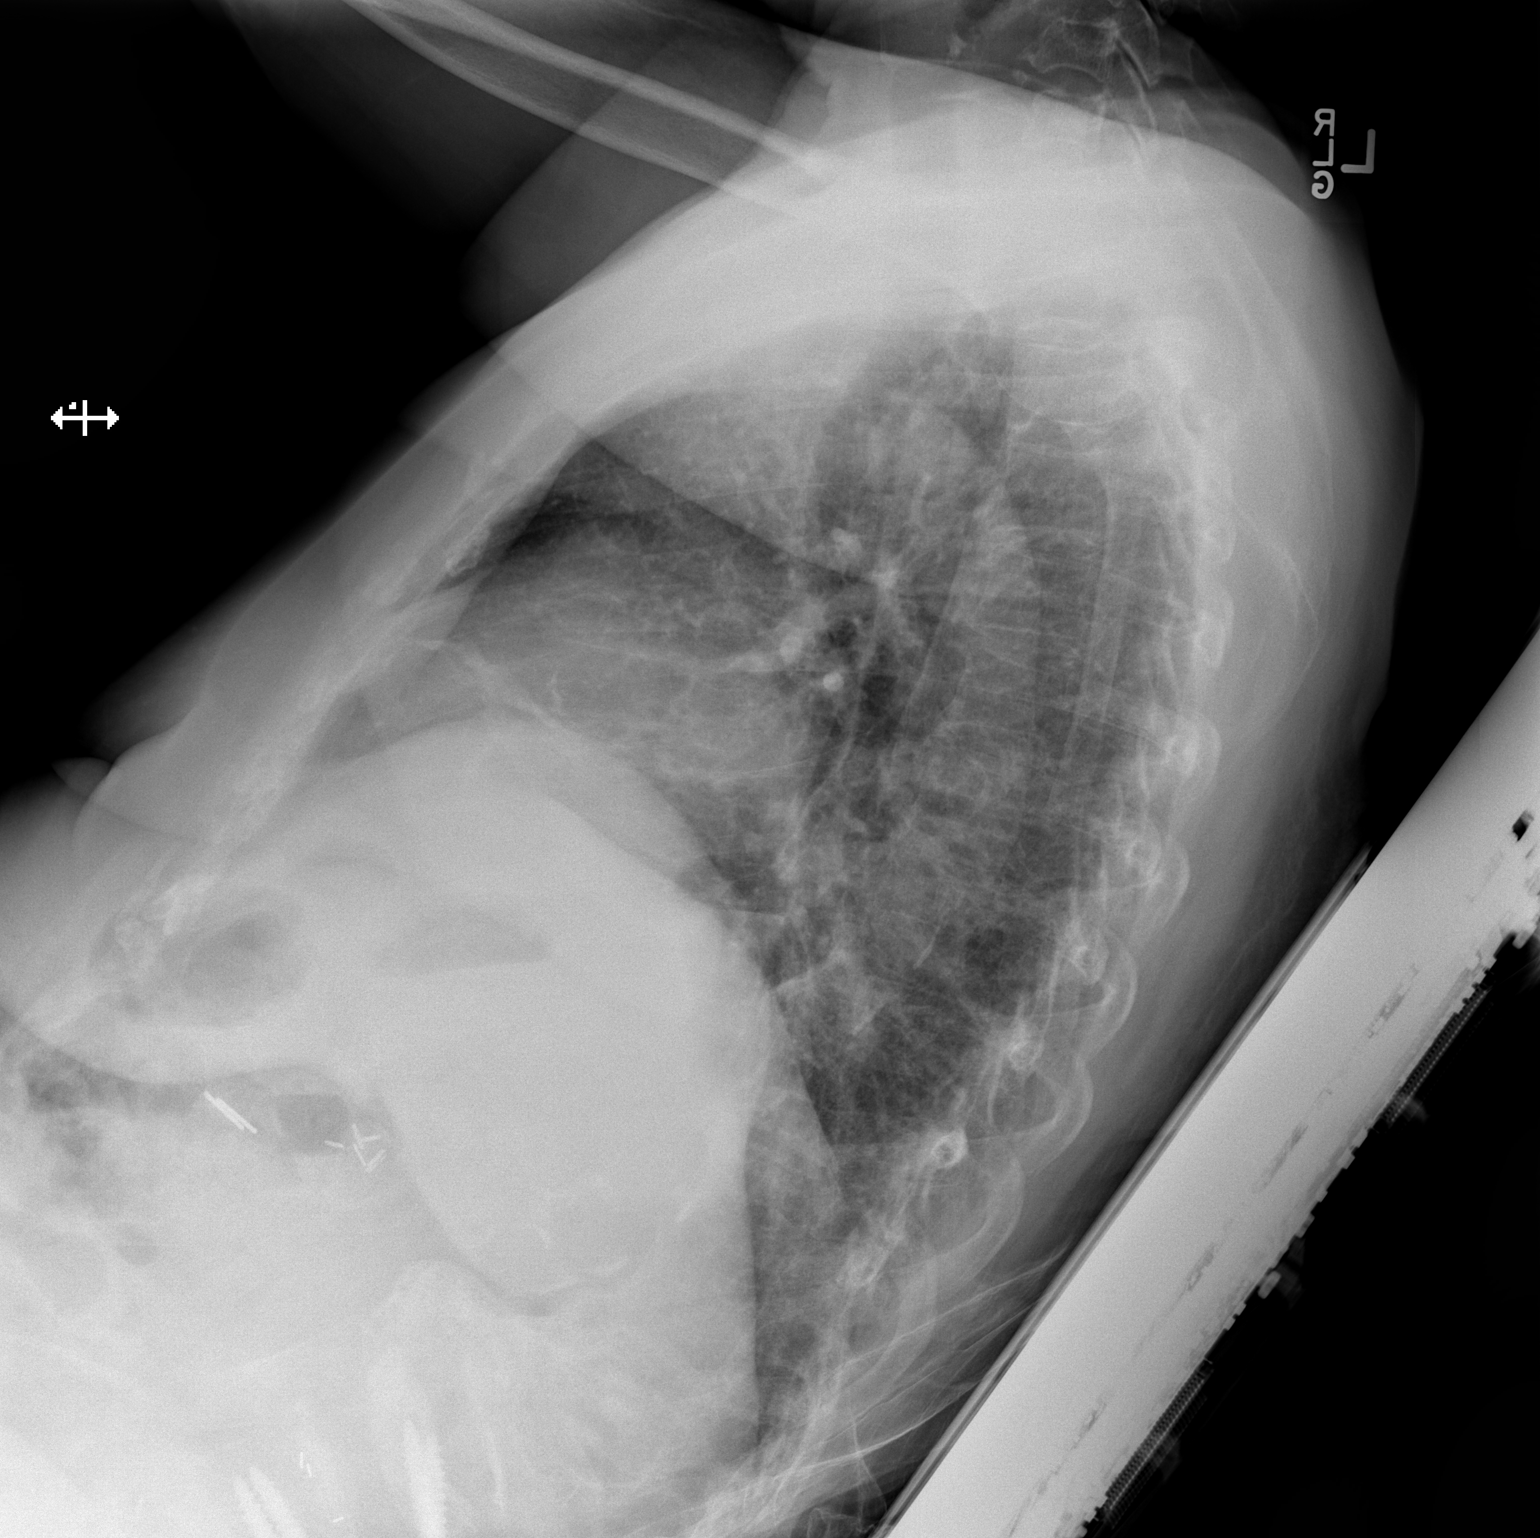

[2 of 2 positions shown; findings below may reference images not displayed]

FINDINGS: Cardiomediastinal silhouette is normal. Mediastinal contours appear
intact.

There is no evidence of focal airspace consolidation, pleural
effusion or pneumothorax. Low lung volumes. Linear opacity in the
right lower lobe likely represents atelectasis.

Osseous structures are without acute abnormality. Soft tissues are
grossly normal.
IMPRESSION: Low lung volumes with right lower lobe atelectasis.

## 2023-09-14 NOTE — Progress Notes (Signed)
 This encounter was created in error - please disregard.
